# Patient Record
Sex: Male | Born: 1955 | Race: Black or African American | Hispanic: No | Marital: Single | Smoking: Former smoker
Health system: Southern US, Community
[De-identification: ages and names within clinical notes are randomized; demographics above are authoritative.]

## PROBLEM LIST (undated history)

## (undated) DIAGNOSIS — N189 Chronic kidney disease, unspecified: Secondary | ICD-10-CM

## (undated) DIAGNOSIS — I10 Essential (primary) hypertension: Secondary | ICD-10-CM

## (undated) DIAGNOSIS — I509 Heart failure, unspecified: Secondary | ICD-10-CM

## (undated) DIAGNOSIS — E119 Type 2 diabetes mellitus without complications: Secondary | ICD-10-CM

## (undated) DIAGNOSIS — I639 Cerebral infarction, unspecified: Secondary | ICD-10-CM

## (undated) HISTORY — PX: WISDOM TOOTH EXTRACTION: SHX21

---

## 2016-01-14 ENCOUNTER — Encounter (HOSPITAL_COMMUNITY): Payer: Self-pay

## 2016-01-14 ENCOUNTER — Inpatient Hospital Stay (HOSPITAL_COMMUNITY)
Admission: EM | Admit: 2016-01-14 | Discharge: 2016-01-17 | DRG: 065 | Disposition: A | Discharge status: Home Health | Payer: Self-pay | Attending: Internal Medicine | Admitting: Internal Medicine

## 2016-01-14 DIAGNOSIS — I160 Hypertensive urgency: Secondary | ICD-10-CM | POA: Diagnosis present

## 2016-01-14 DIAGNOSIS — E1165 Type 2 diabetes mellitus with hyperglycemia: Secondary | ICD-10-CM | POA: Diagnosis present

## 2016-01-14 DIAGNOSIS — I10 Essential (primary) hypertension: Secondary | ICD-10-CM | POA: Diagnosis present

## 2016-01-14 DIAGNOSIS — E1149 Type 2 diabetes mellitus with other diabetic neurological complication: Secondary | ICD-10-CM | POA: Diagnosis present

## 2016-01-14 DIAGNOSIS — R42 Dizziness and giddiness: Secondary | ICD-10-CM

## 2016-01-14 DIAGNOSIS — H5509 Other forms of nystagmus: Secondary | ICD-10-CM | POA: Diagnosis present

## 2016-01-14 DIAGNOSIS — G450 Vertebro-basilar artery syndrome: Secondary | ICD-10-CM

## 2016-01-14 DIAGNOSIS — E876 Hypokalemia: Secondary | ICD-10-CM | POA: Diagnosis present

## 2016-01-14 DIAGNOSIS — N179 Acute kidney failure, unspecified: Secondary | ICD-10-CM | POA: Diagnosis present

## 2016-01-14 DIAGNOSIS — F17210 Nicotine dependence, cigarettes, uncomplicated: Secondary | ICD-10-CM | POA: Diagnosis present

## 2016-01-14 DIAGNOSIS — I639 Cerebral infarction, unspecified: Principal | ICD-10-CM | POA: Diagnosis present

## 2016-01-14 HISTORY — DX: Type 2 diabetes mellitus without complications: E11.9

## 2016-01-14 HISTORY — DX: Essential (primary) hypertension: I10

## 2016-01-14 LAB — CBC
HEMATOCRIT: 40 % (ref 39.0–52.0)
Hemoglobin: 13.9 g/dL (ref 13.0–17.0)
MCH: 32.1 pg (ref 26.0–34.0)
MCHC: 34.8 g/dL (ref 30.0–36.0)
MCV: 92.4 fL (ref 78.0–100.0)
Platelets: 246 10*3/uL (ref 150–400)
RBC: 4.33 MIL/uL (ref 4.22–5.81)
RDW: 13.6 % (ref 11.5–15.5)
WBC: 13.1 10*3/uL — ABNORMAL HIGH (ref 4.0–10.5)

## 2016-01-14 LAB — CBG MONITORING, ED: Glucose-Capillary: 203 mg/dL — ABNORMAL HIGH (ref 65–99)

## 2016-01-14 MED ORDER — ONDANSETRON 4 MG PO TBDP: 4.0000 mg | ORAL_TABLET | Freq: Once | ORAL | Status: DC | PRN

## 2016-01-14 MED ORDER — ONDANSETRON 8 MG PO TBDP
8.0000 mg | ORAL_TABLET | Freq: Once | ORAL | Status: AC | PRN
  Administered 2016-01-14: 8 mg via ORAL

## 2016-01-14 MED ORDER — ONDANSETRON 8 MG PO TBDP
ORAL_TABLET | ORAL | Status: AC
  Filled 2016-01-14: qty 1

## 2016-01-14 NOTE — ED Triage Notes (Signed)
Nausea and vomiting today after eating [employer]. Emesis X3 today. Denies diarrhea

## 2016-01-14 NOTE — ED Notes (Signed)
Patient states he has high blood pressure, but does not take his medication for it.

## 2016-01-15 ENCOUNTER — Observation Stay (HOSPITAL_BASED_OUTPATIENT_CLINIC_OR_DEPARTMENT_OTHER): Payer: Self-pay

## 2016-01-15 ENCOUNTER — Inpatient Hospital Stay (HOSPITAL_COMMUNITY): Payer: Self-pay

## 2016-01-15 ENCOUNTER — Observation Stay (HOSPITAL_COMMUNITY): Payer: Self-pay

## 2016-01-15 DIAGNOSIS — I639 Cerebral infarction, unspecified: Secondary | ICD-10-CM | POA: Diagnosis present

## 2016-01-15 DIAGNOSIS — E876 Hypokalemia: Secondary | ICD-10-CM

## 2016-01-15 DIAGNOSIS — N179 Acute kidney failure, unspecified: Secondary | ICD-10-CM

## 2016-01-15 DIAGNOSIS — I160 Hypertensive urgency: Secondary | ICD-10-CM | POA: Diagnosis present

## 2016-01-15 DIAGNOSIS — I6350 Cerebral infarction due to unspecified occlusion or stenosis of unspecified cerebral artery: Secondary | ICD-10-CM

## 2016-01-15 DIAGNOSIS — E1149 Type 2 diabetes mellitus with other diabetic neurological complication: Secondary | ICD-10-CM

## 2016-01-15 LAB — URINALYSIS, ROUTINE W REFLEX MICROSCOPIC
BILIRUBIN URINE: NEGATIVE
Glucose, UA: 500 mg/dL — AB
Ketones, ur: 15 mg/dL — AB
LEUKOCYTES UA: NEGATIVE
NITRITE: NEGATIVE
PH: 7 (ref 5.0–8.0)
Protein, ur: 100 mg/dL — AB
SPECIFIC GRAVITY, URINE: 1.015 (ref 1.005–1.030)

## 2016-01-15 LAB — ECHOCARDIOGRAM COMPLETE
AVLVOTPG: 3 mmHg
CHL CUP STROKE VOLUME: 61 mL
EERAT: 15.21
EWDT: 176 ms
FS: 29 % (ref 28–44)
Height: 72 in
IVS/LV PW RATIO, ED: 0.8
LA vol index: 35.8 mL/m2
LA vol: 73.9 mL
LADIAMINDEX: 2.42 cm/m2
LASIZE: 50 mm
LAVOLA4C: 71.7 mL
LEFT ATRIUM END SYS DIAM: 50 mm
LV E/e' medial: 15.21
LV PW d: 16.9 mm — AB (ref 0.6–1.1)
LV SIMPSON'S DISK: 56
LV TDI E'MEDIAL: 7.72
LV dias vol index: 52 mL/m2
LV dias vol: 108 mL (ref 62–150)
LV e' LATERAL: 6.64 cm/s
LVEEAVG: 15.21
LVOT SV: 66 mL
LVOT VTI: 17.3 cm
LVOT area: 3.8 cm2
LVOT diameter: 22 mm
LVOT peak vel: 82 cm/s
LVSYSVOL: 48 mL (ref 21–61)
LVSYSVOLIN: 23 mL/m2
MV Dec: 176
MV pk A vel: 42.7 m/s
MVPG: 4 mmHg
MVPKEVEL: 101 m/s
RV LATERAL S' VELOCITY: 10.2 cm/s
RV TAPSE: 17.4 mm
TDI e' lateral: 6.64
Weight: 2934.76 oz

## 2016-01-15 LAB — COMPREHENSIVE METABOLIC PANEL
ALBUMIN: 4.2 g/dL (ref 3.5–5.0)
ALBUMIN: 3.9 g/dL (ref 3.5–5.0)
ALK PHOS: 67 U/L (ref 38–126)
ALK PHOS: 60 U/L (ref 38–126)
ALT: 28 U/L (ref 17–63)
ALT: 25 U/L (ref 17–63)
ANION GAP: 12 (ref 5–15)
ANION GAP: 12 (ref 5–15)
AST: 36 U/L (ref 15–41)
AST: 30 U/L (ref 15–41)
BUN: 21 mg/dL — ABNORMAL HIGH (ref 6–20)
BUN: 18 mg/dL (ref 6–20)
CALCIUM: 8.3 mg/dL — AB (ref 8.9–10.3)
CHLORIDE: 93 mmol/L — AB (ref 101–111)
CHLORIDE: 96 mmol/L — AB (ref 101–111)
CO2: 29 mmol/L (ref 22–32)
CO2: 28 mmol/L (ref 22–32)
CREATININE: 1.5 mg/dL — AB (ref 0.61–1.24)
Calcium: 8.8 mg/dL — ABNORMAL LOW (ref 8.9–10.3)
Creatinine, Ser: 1.3 mg/dL — ABNORMAL HIGH (ref 0.61–1.24)
GFR calc non Af Amer: 49 mL/min — ABNORMAL LOW (ref >60)
GFR calc non Af Amer: 58 mL/min — ABNORMAL LOW (ref >60)
GFR, EST AFRICAN AMERICAN: 56 mL/min — AB (ref >60)
GLUCOSE: 217 mg/dL — AB (ref 65–99)
Glucose, Bld: 201 mg/dL — ABNORMAL HIGH (ref 65–99)
POTASSIUM: 2.9 mmol/L — AB (ref 3.5–5.1)
Potassium: 3.1 mmol/L — ABNORMAL LOW (ref 3.5–5.1)
SODIUM: 134 mmol/L — AB (ref 135–145)
SODIUM: 136 mmol/L (ref 135–145)
Total Bilirubin: 1.1 mg/dL (ref 0.3–1.2)
Total Bilirubin: 1.5 mg/dL — ABNORMAL HIGH (ref 0.3–1.2)
Total Protein: 8.1 g/dL (ref 6.5–8.1)
Total Protein: 7.4 g/dL (ref 6.5–8.1)

## 2016-01-15 LAB — LIPID PANEL
CHOL/HDL RATIO: 3 ratio
Cholesterol: 147 mg/dL (ref 0–200)
HDL: 49 mg/dL (ref >40)
LDL CALC: 90 mg/dL (ref 0–99)
TRIGLYCERIDES: 41 mg/dL (ref <150)
VLDL: 8 mg/dL (ref 0–40)

## 2016-01-15 LAB — GLUCOSE, CAPILLARY
GLUCOSE-CAPILLARY: 186 mg/dL — AB (ref 65–99)
Glucose-Capillary: 167 mg/dL — ABNORMAL HIGH (ref 65–99)
Glucose-Capillary: 184 mg/dL — ABNORMAL HIGH (ref 65–99)

## 2016-01-15 LAB — MRSA PCR SCREENING: MRSA by PCR: NEGATIVE

## 2016-01-15 LAB — URINE MICROSCOPIC-ADD ON
SQUAMOUS EPITHELIAL / LPF: NONE SEEN
WBC UA: NONE SEEN WBC/hpf (ref 0–5)

## 2016-01-15 LAB — TROPONIN I: TROPONIN I: 0.03 ng/mL — AB (ref <0.03)

## 2016-01-15 LAB — ETHANOL: ALCOHOL ETHYL (B): 7 mg/dL — AB (ref <5)

## 2016-01-15 LAB — TSH: TSH: 0.978 u[IU]/mL (ref 0.350–4.500)

## 2016-01-15 LAB — LIPASE, BLOOD: Lipase: 32 U/L (ref 11–51)

## 2016-01-15 MED ORDER — LABETALOL HCL 5 MG/ML IV SOLN
2.0000 mg/min | INTRAVENOUS | Status: DC
  Administered 2016-01-15: 2 mg/min via INTRAVENOUS
  Administered 2016-01-15: 0.5 mg/min via INTRAVENOUS
  Administered 2016-01-15: 1 mg/min via INTRAVENOUS
  Administered 2016-01-16: 2 mg/min via INTRAVENOUS
  Filled 2016-01-15: qty 100

## 2016-01-15 MED ORDER — INSULIN ASPART 100 UNIT/ML ~~LOC~~ SOLN
0.0000 [IU] | Freq: Three times a day (TID) | SUBCUTANEOUS | Status: DC
  Administered 2016-01-15 (×2): 3 [IU] via SUBCUTANEOUS
  Administered 2016-01-16 (×2): 5 [IU] via SUBCUTANEOUS
  Administered 2016-01-16: 3 [IU] via SUBCUTANEOUS
  Administered 2016-01-17: 2 [IU] via SUBCUTANEOUS

## 2016-01-15 MED ORDER — LABETALOL HCL 5 MG/ML IV SOLN
INTRAVENOUS | Status: AC
  Filled 2016-01-15: qty 4

## 2016-01-15 MED ORDER — LABETALOL HCL 5 MG/ML IV SOLN
INTRAVENOUS | Status: AC
  Filled 2016-01-15: qty 20

## 2016-01-15 MED ORDER — ONDANSETRON HCL 4 MG/2ML IJ SOLN
4.0000 mg | Freq: Once | INTRAMUSCULAR | Status: AC
  Administered 2016-01-15: 4 mg via INTRAVENOUS
  Filled 2016-01-15: qty 2

## 2016-01-15 MED ORDER — POTASSIUM CHLORIDE 10 MEQ/100ML IV SOLN
10.0000 meq | INTRAVENOUS | Status: AC
Start: 2016-01-15 — End: 2016-01-15
  Administered 2016-01-15 (×4): 10 meq via INTRAVENOUS
  Filled 2016-01-15 (×4): qty 100

## 2016-01-15 MED ORDER — MECLIZINE HCL 12.5 MG PO TABS
25.0000 mg | ORAL_TABLET | Freq: Once | ORAL | Status: AC
  Administered 2016-01-15: 25 mg via ORAL
  Filled 2016-01-15: qty 2

## 2016-01-15 MED ORDER — POTASSIUM CHLORIDE CRYS ER 20 MEQ PO TBCR
40.0000 meq | EXTENDED_RELEASE_TABLET | Freq: Once | ORAL | Status: AC
  Administered 2016-01-15: 40 meq via ORAL
  Filled 2016-01-15: qty 2

## 2016-01-15 MED ORDER — METOPROLOL TARTRATE 25 MG PO TABS
25.0000 mg | ORAL_TABLET | Freq: Two times a day (BID) | ORAL | Status: DC
  Administered 2016-01-15 – 2016-01-17 (×5): 25 mg via ORAL
  Filled 2016-01-15 (×5): qty 1

## 2016-01-15 MED ORDER — ASPIRIN 325 MG PO TABS
325.0000 mg | ORAL_TABLET | Freq: Every day | ORAL | Status: DC
  Administered 2016-01-15 – 2016-01-17 (×3): 325 mg via ORAL
  Filled 2016-01-15 (×3): qty 1

## 2016-01-15 MED ORDER — HYDRALAZINE HCL 20 MG/ML IJ SOLN
20.0000 mg | Freq: Once | INTRAMUSCULAR | Status: DC
  Filled 2016-01-15: qty 1

## 2016-01-15 MED ORDER — INSULIN ASPART 100 UNIT/ML ~~LOC~~ SOLN
4.0000 [IU] | Freq: Three times a day (TID) | SUBCUTANEOUS | Status: DC
  Administered 2016-01-15 – 2016-01-17 (×6): 4 [IU] via SUBCUTANEOUS

## 2016-01-15 MED ORDER — OXYCODONE-ACETAMINOPHEN 5-325 MG PO TABS
1.0000 | ORAL_TABLET | ORAL | Status: DC | PRN
  Administered 2016-01-15: 1 via ORAL
  Filled 2016-01-15: qty 1

## 2016-01-15 MED ORDER — ENOXAPARIN SODIUM 40 MG/0.4ML ~~LOC~~ SOLN
40.0000 mg | SUBCUTANEOUS | Status: DC
  Administered 2016-01-15 – 2016-01-17 (×3): 40 mg via SUBCUTANEOUS
  Filled 2016-01-15 (×3): qty 0.4

## 2016-01-15 MED ORDER — STROKE: EARLY STAGES OF RECOVERY BOOK
Freq: Once | Status: DC
  Filled 2016-01-15: qty 1

## 2016-01-15 MED ORDER — INSULIN ASPART 100 UNIT/ML ~~LOC~~ SOLN: 0.0000 [IU] | Freq: Every day | SUBCUTANEOUS | Status: DC

## 2016-01-15 MED ORDER — HYDRALAZINE HCL 20 MG/ML IJ SOLN
10.0000 mg | Freq: Once | INTRAMUSCULAR | Status: AC
  Administered 2016-01-15: 10 mg via INTRAVENOUS
  Filled 2016-01-15: qty 1

## 2016-01-15 MED ORDER — SODIUM CHLORIDE 0.9 % IV SOLN
INTRAVENOUS | Status: DC
  Administered 2016-01-15: 10 mL via INTRAVENOUS

## 2016-01-15 MED ORDER — ONDANSETRON HCL 4 MG/2ML IJ SOLN: 4.0000 mg | Freq: Four times a day (QID) | INTRAMUSCULAR | Status: DC | PRN

## 2016-01-15 MED ORDER — ASPIRIN 300 MG RE SUPP: 300.0000 mg | Freq: Every day | RECTAL | Status: DC

## 2016-01-15 NOTE — Progress Notes (Signed)
Patient got up with PT this afternoon.  Did not tolerate well. Became dizzy and started to vomit.  Placed back on side of bed.  Rechecked BP with was elevated to 190's/120's.  Orthostatic BP negative.  Restarted labetalol drip at 1mg /min. Text paged Dr. Ardyth HarpsHernandez to inform of condition.  Patient is resting quietly at this time with no c/o of pain.  Neuro assessment done no signs of deficients noted.

## 2016-01-15 NOTE — Progress Notes (Signed)
Patient seen and examined, database reviewed. Patient admitted earlier today with dizziness. 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 Has been started on a labetalol drip, BP down to around 160/90. Will start PO metoprolol, wean labetalol drip. Stroke work up is in progress. Start SSI. Once off drip can transfer to floor.  Peggye PittEstela Hernandez, MD [facility] Pager: [phone]

## 2016-01-15 NOTE — Progress Notes (Signed)
Inpatient Diabetes Program Recommendations  AACE/ADA: New Consensus Statement on Inpatient Glycemic Control (2015)  Target Ranges:  Prepandial:   less than 140 mg/dL      Peak postprandial:   less than 180 mg/dL (1-2 hours)      Critically ill patients:  140 - 180 mg/dL   Results for Matthew Robles, Matthew Robles (MRN [id number]) as of 01/15/2016 08:14  Ref. Range 01/14/2016 23:00  Glucose-Capillary Latest Ref Range: 65 - 99 mg/dL 403203 (H)   Review of Glycemic Control  Diabetes history: DM2 Outpatient Diabetes medications: None noted on home medication list Current orders for Inpatient glycemic control: None  Inpatient Diabetes Program Recommendations: Correction (SSI): Please consider ordering CBGs with Novolog correction scale Q4H (if diet resumed change to ACHS). HgbA1C: A1C in process.  Thanks, Matthew PennerMarie Donjuan Robison, RN, MSN, CDE Diabetes Coordinator Inpatient Diabetes Program [phone] (Team Pager from 8am to 5pm) [phone] ([facility] office) [phone] [facility] office) [phone] [facility] office)

## 2016-01-15 NOTE — Consult Note (Signed)
Amberley A. Merlene Laughter, MD     [email or web address]          Matthew Robles is an 60 y.o. male.   ASSESSMENT/PLAN: 1. 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 The patient's symptoms seem somewhat worse than the MRI findings. This makes me wonder if he has vertebrobasilar insufficiency. The hypertension on presentation is also concerning for possible vertebrobasilar insufficiency.  2. Chronic ischemic changes on MRI although the findings may also suggest posterior reversible encephalopathy syndrome is particularly due to poorly controlled hypertension.      RECOMMENDATION: Brain MRA to assess the intracranial blood vessels. Continue with aspirin. His blood pressure is currently 160/90s. I think this is fine. I would not drop it much further in the next couple days. Additional labs.     The patient is 59 year old white male who presents with a two-day history of acute vertiginous symptoms. The patient reports that on yesterday he developed dizziness described as disequilibrium. This was followed several minutes later with nausea and vomiting. The patient denies headaches. He denies focal numbness or weakness. He denies dysarthria or dysphasia. The patient has a history of hypertension and diabetes. He tells me however that he has not taken medications in several years. He has been managing his diabetes by diet control. He has not taken any medications for blood pressure. The patient reports feeling somewhat better today. He does not report having chest pain or shortness of breath. The review systems is otherwise negative.    GENERAL: This is a pleasant man in no acute distress.  HEENT: Supple. Atraumatic normocephalic.   ABDOMEN: soft  EXTREMITIES: No edema   BACK: Normal.  SKIN: Normal by inspection.    MENTAL STATUS: He is sleeping but easily arousable. He is coherent and lucid. He is oriented 3 including month and the his age.  He follows commands well. There is no evidence of dysarthria or aphasia.  CRANIAL NERVES: Pupils are equal, round and reactive to light and accommodation; extra ocular movements are full, there is significant nystagmus makes torsional horizontal nystagmus in primary position and in all gazes of movements including vertical but most pronounced in the right; visual fields are full; upper and lower facial muscles are normal in strength and symmetric, there is no flattening of the nasolabial folds; tongue is midline; uvula is midline; shoulder elevation is normal.  MOTOR: Normal tone, bulk and strength; no pronator drift.  COORDINATION: Left finger to nose is normal, right finger to nose is normal, No rest tremor; no intention tremor; no postural tremor; no bradykinesia. Heel-to-shin is also normal.  REFLEXES: Deep tendon reflexes are symmetrical and normal. Babinski reflexes are flexor bilaterally.   SENSATION: Normal to light touch. There is no extinction to double simultaneous tactile or visual stimulation.   NIH stroke scale is 1.     The brain MRI is reviewed in person. There is a small infarct involving the inferior cerebellum on the right side. It is seen on 2 cuts on diffusion imaging. There are significant chronic ischemic white matter changes seen particular involving the periventricular regions. There is more seen along the posterior horn of the lateral ventricle suggestive of posterior reversible syndrome. No hemorrhages are seen. No chronic infarcts are seen.   Blood pressure (!) 170/100, pulse 82, temperature 98.1 F (36.7 C), temperature source Oral, resp. rate 14, height 6' (1.829 m), weight 183 lb 6.8 oz (83.2 kg), SpO2 100 %.  Past Medical History:  Diagnosis Date  . Diabetes  mellitus without complication ([place])   . Hypertension     History reviewed. No pertinent surgical history.  History reviewed. No pertinent family history.  Social History:  reports that he has been  smoking Cigarettes.  He has been smoking about 1.00 pack per day. He has never used smokeless tobacco. He reports that he does not drink alcohol. His drug history is not on file.  Allergies: No Known Allergies  Medications: Prior to Admission medications   Not on File    Scheduled Meds: .  stroke: mapping our early stages of recovery book   Does not apply Once  . aspirin  300 mg Rectal Daily   Or  . aspirin  325 mg Oral Daily  . enoxaparin (LOVENOX) injection  40 mg Subcutaneous Q24H  . insulin aspart  0-15 Units Subcutaneous TID WC  . insulin aspart  0-5 Units Subcutaneous QHS  . insulin aspart  4 Units Subcutaneous TID WC  . metoprolol tartrate  25 mg Oral BID   Continuous Infusions: . sodium chloride 10 mL (01/15/16 0559)  . labetalol (NORMODYNE) infusion 1 mg/min (01/15/16 1721)   PRN Meds:.ondansetron (ZOFRAN) IV     Results for orders placed or performed during the hospital encounter of 01/14/16 (from the past 48 hour(s))  CBG monitoring, ED     Status: Abnormal   Collection Time: 01/14/16 11:00 PM  Result Value Ref Range   Glucose-Capillary 203 (H) 65 - 99 mg/dL  Lipase, blood     Status: None   Collection Time: 01/14/16 11:20 PM  Result Value Ref Range   Lipase 32 11 - 51 U/L  Comprehensive metabolic panel     Status: Abnormal   Collection Time: 01/14/16 11:20 PM  Result Value Ref Range   Sodium 134 (L) 135 - 145 mmol/L   Potassium 2.9 (L) 3.5 - 5.1 mmol/L   Chloride 93 (L) 101 - 111 mmol/L   CO2 29 22 - 32 mmol/L   Glucose, Bld 201 (H) 65 - 99 mg/dL   BUN 21 (H) 6 - 20 mg/dL   Creatinine, Ser 1.50 (H) 0.61 - 1.24 mg/dL   Calcium 8.8 (L) 8.9 - 10.3 mg/dL   Total Protein 8.1 6.5 - 8.1 g/dL   Albumin 4.2 3.5 - 5.0 g/dL   AST 36 15 - 41 U/L   ALT 28 17 - 63 U/L   Alkaline Phosphatase 67 38 - 126 U/L   Total Bilirubin 1.1 0.3 - 1.2 mg/dL   GFR calc non Af Amer 49 (L) >60 mL/min   GFR calc Af Amer 56 (L) >60 mL/min    Comment: (NOTE) The eGFR has been  calculated using the CKD EPI equation. This calculation has not been validated in all clinical situations. eGFR's persistently <60 mL/min signify possible Chronic Kidney Disease.    Anion gap 12 5 - 15  CBC     Status: Abnormal   Collection Time: 01/14/16 11:20 PM  Result Value Ref Range   WBC 13.1 (H) 4.0 - 10.5 K/uL   RBC 4.33 4.22 - 5.81 MIL/uL   Hemoglobin 13.9 13.0 - 17.0 g/dL   HCT 40.0 39.0 - 52.0 %   MCV 92.4 78.0 - 100.0 fL   MCH 32.1 26.0 - 34.0 pg   MCHC 34.8 30.0 - 36.0 g/dL   RDW 13.6 11.5 - 15.5 %   Platelets 246 150 - 400 K/uL  Troponin I     Status: Abnormal   Collection Time: 01/14/16 11:20 PM  Result Value Ref Range   Troponin I 0.03 (HH) <0.03 ng/mL    Comment: CRITICAL RESULT CALLED TO, READ BACK BY AND VERIFIED WITH: Annett Fabian AT 0116 ON [id number] BY FORSYTH K   Ethanol     Status: Abnormal   Collection Time: 01/14/16 11:20 PM  Result Value Ref Range   Alcohol, Ethyl (B) 7 (H) <5 mg/dL    Comment:        LOWEST DETECTABLE LIMIT FOR SERUM ALCOHOL IS 5 mg/dL FOR MEDICAL PURPOSES ONLY   Urinalysis, Routine w reflex microscopic     Status: Abnormal   Collection Time: 01/14/16 11:35 PM  Result Value Ref Range   Color, Urine YELLOW YELLOW   APPearance CLEAR CLEAR   Specific Gravity, Urine 1.015 1.005 - 1.030   pH 7.0 5.0 - 8.0   Glucose, UA 500 (A) NEGATIVE mg/dL   Hgb urine dipstick SMALL (A) NEGATIVE   Bilirubin Urine NEGATIVE NEGATIVE   Ketones, ur 15 (A) NEGATIVE mg/dL   Protein, ur 100 (A) NEGATIVE mg/dL   Nitrite NEGATIVE NEGATIVE   Leukocytes, UA NEGATIVE NEGATIVE  Urine microscopic-add on     Status: Abnormal   Collection Time: 01/14/16 11:35 PM  Result Value Ref Range   Squamous Epithelial / LPF NONE SEEN NONE SEEN   WBC, UA NONE SEEN 0 - 5 WBC/hpf   RBC / HPF 0-5 0 - 5 RBC/hpf   Bacteria, UA RARE (A) NONE SEEN  MRSA PCR Screening     Status: None   Collection Time: 01/15/16  2:43 AM  Result Value Ref Range   MRSA by PCR NEGATIVE NEGATIVE      Comment:        The GeneXpert MRSA Assay (FDA approved for NASAL specimens only), is one component of a comprehensive MRSA colonization surveillance program. It is not intended to diagnose MRSA infection nor to guide or monitor treatment for MRSA infections.   Comprehensive metabolic panel     Status: Abnormal   Collection Time: 01/15/16  5:49 AM  Result Value Ref Range   Sodium 136 135 - 145 mmol/L   Potassium 3.1 (L) 3.5 - 5.1 mmol/L   Chloride 96 (L) 101 - 111 mmol/L   CO2 28 22 - 32 mmol/L   Glucose, Bld 217 (H) 65 - 99 mg/dL   BUN 18 6 - 20 mg/dL   Creatinine, Ser 1.30 (H) 0.61 - 1.24 mg/dL   Calcium 8.3 (L) 8.9 - 10.3 mg/dL   Total Protein 7.4 6.5 - 8.1 g/dL   Albumin 3.9 3.5 - 5.0 g/dL   AST 30 15 - 41 U/L   ALT 25 17 - 63 U/L   Alkaline Phosphatase 60 38 - 126 U/L   Total Bilirubin 1.5 (H) 0.3 - 1.2 mg/dL   GFR calc non Af Amer 58 (L) >60 mL/min   GFR calc Af Amer >60 >60 mL/min    Comment: (NOTE) The eGFR has been calculated using the CKD EPI equation. This calculation has not been validated in all clinical situations. eGFR's persistently <60 mL/min signify possible Chronic Kidney Disease.    Anion gap 12 5 - 15  Lipid panel     Status: None   Collection Time: 01/15/16  5:49 AM  Result Value Ref Range   Cholesterol 147 0 - 200 mg/dL   Triglycerides 41 <150 mg/dL   HDL 49 >40 mg/dL   Total CHOL/HDL Ratio 3.0 RATIO   VLDL 8 0 - 40 mg/dL   LDL  Cholesterol 90 0 - 99 mg/dL    Comment:        Total Cholesterol/HDL:CHD Risk Coronary Heart Disease Risk Table                     Men   Women  1/2 Average Risk   3.4   3.3  Average Risk       5.0   4.4  2 X Average Risk   9.6   7.1  3 X Average Risk  23.4   11.0        Use the calculated Patient Ratio above and the CHD Risk Table to determine the patient's CHD Risk.        ATP III CLASSIFICATION (LDL):  <100     mg/dL   Optimal  100-129  mg/dL   Near or Above                    Optimal  130-159  mg/dL    Borderline  160-189  mg/dL   High  >190     mg/dL   Very High   Glucose, capillary     Status: Abnormal   Collection Time: 01/15/16 11:39 AM  Result Value Ref Range   Glucose-Capillary 186 (H) 65 - 99 mg/dL  Glucose, capillary     Status: Abnormal   Collection Time: 01/15/16  4:42 PM  Result Value Ref Range   Glucose-Capillary 167 (H) 65 - 99 mg/dL   Comment 1 Notify RN     Studies/Results:   BRAIN MRI FINDINGS: Brain: There is a 3 mm focus of diffusion restriction within the right inferior cerebellar hemisphere consistent with acute infarction. No abnormal susceptibility hypointensity to indicate intracranial hemorrhage. There are numerous foci of T2 FLAIR hyperintense signal abnormality in subcortical and periventricular white matter probably representing chronic microvascular ischemic changes. Additionally there are confluent white matter T2 FLAIR hyperintense signal abnormalities within biparietal white matter an within the pons white matter.  Extra-axial space: No hydrocephalus. No midline shift. No effacement of basilar cisterns. No extra-axial collection is identified. Proximal intracranial flow voids are maintained. No abnormality of the cervical medullary junction.  Other: Extensive paranasal sinus mucosal thickening and patchy opacification. Bilateral maxillary sinus mucous retention cysts. No abnormal signal of the mastoid air cells. Orbits are unremarkable. Calvarium is unremarkable.  IMPRESSION: 1. Small acute infarct within the right inferior cerebellar hemisphere. 2. Patchy white matter T2 FLAIR signal abnormalities in the bilateral parietal lobes and in brainstem white matter may represent developing acute hypertensive encephalopathy/PRES. 3. Extensive paranasal sinus disease.     CAROTID DOPPLERS Color duplex indicates minimal heterogeneous and calcified plaque, with no hemodynamically significant stenosis by duplex criteria in the  extracranial cerebrovascular circulation.      Rael Yo A. Merlene Laughter, M.D.  Diplomate, Tax adviser of Psychiatry and Neurology ( Neurology). 01/15/2016, 5:24 PM

## 2016-01-15 NOTE — Evaluation (Signed)
Physical Therapy Evaluation Patient Details Name: Matthew Robles MRN: [id number] DOB: [birth year] Today's Date: 01/15/2016   History of Present Illness  60 y.o. male, with history of hypertension, not on medications, diabetes mellitus who came to the hospital after patient started feeling dizzy this afternoon. Patient says that he stopped at [facility] to have a snack but was not able to eat because of constant dizziness. He called his brother brought him to the ED. Patient has history of hypertension but does not take medications as he prefers to control it naturally. Patient had episode of nausea and vomiting the dizziness today. He denies any focal weakness or tingling of extremities. No slurred speech, no vision changes. No previous history of stroke or TIA.  In the ED patient was found to be in hypertensive urgency, started on labetalol drip.  MRI brain was obtained that showed small acute infarct within the right inferior cerebellar hemisphere.  Patchy white matter T2 FLAIR signal abnormalities in the bilateral parietal lobes and in brainstem white matter may represent developing acute hypertensive encephalopathy/PRES.  Clinical Impression  Pt received in bed, and was agreeable to PT evaluation.  Dtr arrived during evaluation.  Pt expressed that he is normally independent with all functional mobility, including gait, ADL's, IADL's, and driving.  He is self employed and works on decks.  He demonstrates full strength and AROM in all 4 extremities.  He c/o loosing his balance, nausea and unsteadiness as his symptoms.  His resting BP was 161/93, with HR of 79bpm.  He was able to ambulate 33ft with no DME today, however after 29ft he demonstrated major LOB, and required Max A from PT to prevent from falling.  After returning to the room his BP was significantly elevated at 183/126, HR: 87bpm.  Pt also had a large amount of emesis.  Discussed d/c disposition, and recommend OPPT vs HHPT pending on if pt would  have a ride to get to OPPT.  Also recommending 24/7 supervision/assistance.  Recommendations for DME still pending.  Will trial DME at next visit.      Follow Up Recommendations Outpatient PT;Home health PT;Supervision/Assistance - 24 hour    Equipment Recommendations   (TBD)    Recommendations for Other Services       Precautions / Restrictions Precautions Precautions: Fall Precaution Comments: dizzy and unsteady with new dx of CVA.  Restrictions Weight Bearing Restrictions: No      Mobility  Bed Mobility Overal bed mobility: Needs Assistance Bed Mobility: Supine to Sit;Sit to Supine     Supine to sit: Supervision;HOB elevated Sit to supine: Supervision      Transfers Overall transfer level: Needs assistance Equipment used: None Transfers: Sit to/from Stand Sit to Stand: Min guard            Ambulation/Gait Ambulation/Gait assistance: Max assist;Min guard Ambulation Distance (Feet): 10 Feet Assistive device: None Gait Pattern/deviations: Ataxic;Staggering right     General Gait Details: Pt demonstrated major LOB after 29ft of gait, which required Max A from PT to prevent pt from falling.  RN provided w/c follow back to the room, and pt sat down on the EOB.  Pt found to have extremely elevated BP 183/126, and vomiting.  RN present at the end.    Stairs            Wheelchair Mobility    Modified Rankin (Stroke Patients Only) Modified Rankin (Stroke Patients Only) Pre-Morbid Rankin Score: No symptoms Modified Rankin: Moderately severe disability  Balance Overall balance assessment: Needs assistance   Sitting balance-Leahy Scale: Normal       Standing balance-Leahy Scale: Poor                               Pertinent Vitals/Pain Pain Assessment: No/denies pain    Home Living   Living Arrangements: Parent (mother)   Type of Home: House Home Access: Stairs to enter Entrance Stairs-Rails: Right Entrance Stairs-Number of  Steps: 3 steps  Home Layout: One level Home Equipment: None      Prior Function Level of Independence: Independent         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      Hand Dominance   Dominant Hand: Right    Extremity/Trunk Assessment   Upper Extremity Assessment: Overall WFL for tasks assessed           Lower Extremity Assessment: Overall WFL for tasks assessed         Communication   Communication: No difficulties  Cognition Arousal/Alertness: Awake/alert Behavior During Therapy: WFL for tasks assessed/performed Overall Cognitive Status: Within Functional Limits for tasks assessed                      General Comments      Exercises        Assessment/Plan    PT Assessment Patient needs continued PT services  PT Diagnosis Difficulty walking;Abnormality of gait   PT Problem List Decreased activity tolerance;Decreased balance;Decreased mobility;Decreased coordination;Decreased safety awareness;Decreased knowledge of precautions  PT Treatment Interventions DME instruction;Gait training;Functional mobility training;Stair training;Therapeutic activities;Balance training;Therapeutic exercise;Patient/family education   PT Goals (Current goals can be found in the Care Plan section) Acute Rehab PT Goals Patient Stated Goal: Pt wants to feel better PT Goal Formulation: With patient/family Time For Goal Achievement: 01/22/16 Potential to Achieve Goals: Good    Frequency 7X/week   Barriers to discharge        Co-evaluation               End of Session Equipment Utilized During Treatment: Gait belt Activity Tolerance: Treatment limited secondary to medical complications (Comment) Patient left: in bed;with family/visitor present;with nursing/sitter in room;with call bell/phone within reach Nurse Communication: Mobility status         Time: 1252-1321 PT Time Calculation (min) (ACUTE ONLY): 29 min   Charges:   PT  Evaluation $PT Eval High Complexity: 1 Procedure PT Treatments $Gait Training: 8-22 mins   PT G Codes:        Rayshawn Visconti 01/15/2016, 2:03 PM

## 2016-01-15 NOTE — Progress Notes (Signed)
*  PRELIMINARY RESULTS* Echocardiogram 2D Echocardiogram has been performed.  Stacey DrainWhite, Gradie Ohm J 01/15/2016, 4:50 PM

## 2016-01-15 NOTE — H&P (Signed)
TRH H&P   Patient Demographics:    Matthew Robles, is a 60 y.o. male  MRN: [id number]  DOB - 04-19-1956  Admit Date - 01/14/2016  Outpatient Primary MD for the patient is No PCP Per Patient  Referring MD/NP/PA: Dr. Elesa Massed  Patient coming from: Home  Chief Complaint  Patient presents with  . Emesis      HPI:    Matthew Robles  is a 60 y.o. male, With history of hypertension, not on medications, diabetes mellitus who came to the hospital after patient started feeling dizzy this afternoon. Patient says that he stopped at [facility] to have a snack but was not able to eat because of constant dizziness. He called his brother brought him to the ED. Patient has history of hypertension but does not take medications as he prefers to control it naturally. Patient had episode of nausea and vomiting the dizziness today. He denies any focal weakness or tingling of extremities. No slurred speech, no vision changes. No previous history of stroke or TIA.  In the ED patient was found to be in hypertensive urgency, started on labetalol drip. MRI brain was obtained that showed small acute infarct within the right inferior cerebellar hemisphere  Patchy white matter T2 FLAIR signal abnormalities in the bilateral parietal lobes and in brainstem white matter may represent developing acute hypertensive encephalopathy/PRES.    Review of systems:    In addition to the HPI above,  No Fever-chills, No Headache, No changes with Vision or hearing, No problems swallowing food or Liquids, No Chest pain, Cough or Shortness of Breath, No Blood in stool or Urine, No dysuria, No new skin rashes or bruises, No new joints pains-aches,  No new weakness, tingling, numbness in any extremity, No recent weight gain or loss, No polyuria, polydypsia or polyphagia, No significant Mental Stressors.  A full 10 point Review of Systems was  done, except as stated above, all other Review of Systems were negative.   With Past History of the following :    Past Medical History:  Diagnosis Date  . Diabetes mellitus without complication (HCC)   . Hypertension       History reviewed. No pertinent surgical history.    Social History:     Social History  Substance Use Topics  . Smoking status: Current Every Day Smoker    Packs/day: 1.00    Types: Cigarettes  . Smokeless tobacco: Never Used  . Alcohol use No     Comment: a couple of times a week       Family History :      Home Medications:   Prior to Admission medications   Not on File     Allergies:    No Known Allergies   Physical Exam:   Vitals  Blood pressure (!) 219/119, pulse 85, temperature 97.9 F (36.6 C), temperature source Oral, resp. rate 15, height 6' (1.829 m), weight 88.5 kg (195 lb), SpO2 98 %.   1. General African-American male lying in bed  in NAD, cooperative with exam  2. Normal affect and insight, Awake Alert, Oriented X 3.  3. No F.N deficits, ALL C.Nerves Intact, Strength 5/5 all 4 extremities, Sensation intact all 4 extremities, Plantars down going.  4. Ears and Eyes appear Normal, Conjunctivae clear, PERRLA. Moist Oral Mucosa.  5. Supple Neck, No JVD, No cervical lymphadenopathy appriciated, No Carotid Bruits.  6. Symmetrical Chest wall movement, Good air movement bilaterally, CTAB.  7. RRR, No Gallops, Rubs or Murmurs, No Parasternal Heave.No Leg edema  8. Positive Bowel Sounds, Abdomen Soft, No tenderness, No organomegaly appriciated,No rebound -guarding or rigidity.  9.  No Cyanosis, Normal Skin Turgor, No Skin Rash or Bruise.  10. Good muscle tone,  joints appear normal , no effusions, Normal ROM.      Data Review:    CBC  Recent Labs Lab 01/14/16 2320  WBC 13.1*  HGB 13.9  HCT 40.0  PLT 246  MCV 92.4  MCH 32.1  MCHC 34.8  RDW 13.6    ------------------------------------------------------------------------------------------------------------------  Chemistries   Recent Labs Lab 01/14/16 2320  NA 134*  K 2.9*  CL 93*  CO2 29  GLUCOSE 201*  BUN 21*  CREATININE 1.50*  CALCIUM 8.8*  AST 36  ALT 28  ALKPHOS 67  BILITOT 1.1   ------------------------------------------------------------------------------------------------------------------  ------------------------------------------------------------------------------------------------------------------ GFR: Estimated Creatinine Clearance: 56.8 mL/min (by C-G formula based on SCr of 1.5 mg/dL). Liver Function Tests:  Recent Labs Lab 01/14/16 2320  AST 36  ALT 28  ALKPHOS 67  BILITOT 1.1  PROT 8.1  ALBUMIN 4.2    Recent Labs Lab 01/14/16 2320  LIPASE 32   No results for input(s): AMMONIA in the last 168 hours. Coagulation Profile: No results for input(s): INR, PROTIME in the last 168 hours. Cardiac Enzymes:  Recent Labs Lab 01/14/16 2320  TROPONINI 0.03*   BNP (last 3 results) No results for input(s): PROBNP in the last 8760 hours. HbA1C: No results for input(s): HGBA1C in the last 72 hours. CBG:  Recent Labs Lab 01/14/16 2300  GLUCAP 203*   Lipid Profile: No results for input(s): CHOL, HDL, LDLCALC, TRIG, CHOLHDL, LDLDIRECT in the last 72 hours. Thyroid Function Tests: No results for input(s): TSH, T4TOTAL, FREET4, T3FREE, THYROIDAB in the last 72 hours. Anemia Panel: No results for input(s): VITAMINB12, FOLATE, FERRITIN, TIBC, IRON, RETICCTPCT in the last 72 hours.  --------------------------------------------------------------------------------------------------------------- Urine analysis:    Component Value Date/Time   COLORURINE YELLOW 01/14/2016 2335   APPEARANCEUR CLEAR 01/14/2016 2335   LABSPEC 1.015 01/14/2016 2335   PHURINE 7.0 01/14/2016 2335   GLUCOSEU 500 (A) 01/14/2016 2335   HGBUR SMALL (A) 01/14/2016 2335    BILIRUBINUR NEGATIVE 01/14/2016 2335   KETONESUR 15 (A) 01/14/2016 2335   PROTEINUR 100 (A) 01/14/2016 2335   NITRITE NEGATIVE 01/14/2016 2335   LEUKOCYTESUR NEGATIVE 01/14/2016 2335      ----------------------------------------------------------------------------------------------------------------   Imaging Results:    [name] Contrast  Result Date: 01/15/2016 CLINICAL DATA:  60 y/o M; nausea and vomiting today with vertigo and history of hypertension. No known injury. EXAM: MRI HEAD WITHOUT CONTRAST TECHNIQUE: Multiplanar, multiecho pulse sequences of the brain and surrounding structures were obtained without intravenous contrast. COMPARISON:  None. FINDINGS: Brain: There is a 3 mm focus of diffusion restriction within the right inferior cerebellar hemisphere consistent with acute infarction. No abnormal susceptibility hypointensity to indicate intracranial hemorrhage. There are numerous foci of T2 FLAIR hyperintense signal abnormality in subcortical and periventricular white matter probably representing chronic microvascular ischemic changes.  Additionally there are confluent white matter T2 FLAIR hyperintense signal abnormalities within biparietal white matter an within the pons white matter. Extra-axial space: No hydrocephalus. No midline shift. No effacement of basilar cisterns. No extra-axial collection is identified. Proximal intracranial flow voids are maintained. No abnormality of the cervical medullary junction. Other: Extensive paranasal sinus mucosal thickening and patchy opacification. Bilateral maxillary sinus mucous retention cysts. No abnormal signal of the mastoid air cells. Orbits are unremarkable. Calvarium is unremarkable. IMPRESSION: 1. Small acute infarct within the right inferior cerebellar hemisphere. 2. Patchy white matter T2 FLAIR signal abnormalities in the bilateral parietal lobes and in brainstem white matter may represent developing acute hypertensive  encephalopathy/PRES. 3. Extensive paranasal sinus disease. These results were called by telephone at the time of interpretation on 01/15/2016 at 3:29 am to Dr. Rochele Raring , who verbally acknowledged these results. Electronically Signed   By: Mitzi Hansen M.D.   On: 01/15/2016 03:31    My personal review of EKG: Rhythm NSR   Assessment & Plan:    Active Problems:   Hypertensive urgency   Cerebellar stroke (HCC)   Hypokalemia   1. PRES- patient presented with hypertensive urgency and developing PRES as per MRI brain, neurology was consulted by the ED physician. Dr Roseanne Reno recommended blood pressure control. We'll consult neurology in a.m.  2.  CVA- MRI brain showed small acute infarct in the right inferior cerebellar hemisphere, patient out of TPA window and also has uncontrolled hypertension which is contraindication for TPA. We'll initiate stroke protocol, check carotid Doppler, CT head, MRA brain, carotid ultrasound, hemoglobin A1c, fasting lipid profile. Aspirin 300 mg daily  3.  Hypertensive urgency-we will continue labetalol drip 4. Hypokalemia- up his potassium went BMP in am   DVT Prophylaxis-   Lovenox   AM Labs Ordered, also please review Full Orders  Family Communication: No family at bedside   Code Status: Full code  Admission status: Observation    Time spent in minutes : 60 minutes   Nailyn Dearinger S M.D on 01/15/2016 at 4:08 AM  Between 7am to 7pm - Pager - [phone]. After 7pm go to [email or web address] - password [facility]  [facility] - Office  [phone]

## 2016-01-15 NOTE — Progress Notes (Signed)
OT Cancellation Note  Patient Details Name: Matthew BarrowSteve Robles MRN: [id number] DOB: 1956/04/22   Cancelled Treatment:     Reason evaluation not completed: Chart reviewed, spoke with nsg staff. Nsg concerned with pt still being on labetalol drip this am, requested OT hold until later today. Will check back at a later time.    Ezra SitesLeslie Troxler, OTR/L  [phone] 01/15/2016, 9:01 AM

## 2016-01-15 NOTE — ED Provider Notes (Signed)
[facility]-EMERGENCY DEPT Provider Note   CSN: [id number] Arrival date & time: 01/14/16  2232     History   Chief Complaint Chief Complaint  Patient presents with  . Emesis    HPI Matthew Robles is a 60 y.o. male with a past medical history of DM and htn but not treated, stating he has not seen a doctor in years presenting with dizziness which is described as unsteadiness and feeling of lightheadedness, not room spinning which started around 12 noon today and was followed by three separate of emesis.  The symptoms started shortly after eating a snack from [employer] which he stated tasted fine but does suspect possible food poisoning.  He denies fevers, abdominal pain or diarrhea however.  He also denies headache, vision changes, focal weakness and has had no chest pain, sob and denies peripheral edema.  He denies any recent illnesses. 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    The history is provided by the patient.    Past Medical History:  Diagnosis Date  . Diabetes mellitus without complication (HCC)   . Hypertension     Patient Active Problem List   Diagnosis Date Noted  . Hypertensive urgency 01/15/2016    History reviewed. No pertinent surgical history.     Home Medications    Prior to Admission medications   Not on File    Family History History reviewed. No pertinent family history.  Social History Social History  Substance Use Topics  . Smoking status: Current Every Day Smoker    Packs/day: 1.00    Types: Cigarettes  . Smokeless tobacco: Never Used  . Alcohol use No     Comment: a couple of times a week     Allergies   Review of patient's allergies indicates no known allergies.   Review of Systems Review of Systems  Constitutional: Negative for fever.  HENT: Negative for congestion and sore throat.   Eyes: Negative.   Respiratory: Negative for chest tightness and  shortness of breath.   Cardiovascular: Negative for chest pain.  Gastrointestinal: Positive for nausea and vomiting. Negative for abdominal pain.  Genitourinary: Negative.   Musculoskeletal: Negative for arthralgias, joint swelling and neck pain.  Skin: Negative.  Negative for rash and wound.  Neurological: Positive for dizziness and light-headedness. Negative for weakness, numbness and headaches.  Psychiatric/Behavioral: Negative.      Physical Exam Updated Vital Signs BP (!) 248/135   Pulse 85   Temp 97.9 F (36.6 C) (Oral)   Resp 16   Ht 6' (1.829 m)   Wt 88.5 kg   SpO2 97%   BMI 26.45 kg/m   Physical Exam  Constitutional: He appears well-developed and well-nourished.  HENT:  Head: Normocephalic and atraumatic.  Eyes: Conjunctivae are normal. Pupils are equal, round, and reactive to light. Right eye exhibits nystagmus. Left eye exhibits nystagmus.  Nystagmus present 2 beat most pronounced with leftward gaze.   Neck: Normal range of motion.  Cardiovascular: Normal rate, regular rhythm, normal heart sounds and intact distal pulses.   Pulmonary/Chest: Effort normal and breath sounds normal. No respiratory distress. He has no wheezes.  Abdominal: Soft. Bowel sounds are normal. He exhibits no mass. There is no tenderness. There is no guarding.  Musculoskeletal: Normal range of motion.  Neurological: He is alert. He has normal strength. He is not disoriented. No cranial nerve deficit or sensory deficit. Coordination normal.  Equal grip strength.  Negative pronator drift.  Skin: Skin is warm and dry.  Psychiatric: He has a normal mood and affect.  Nursing note and vitals reviewed.    ED Treatments / Results  Labs (all labs ordered are listed, but only abnormal results are displayed) Labs Reviewed  COMPREHENSIVE METABOLIC PANEL - Abnormal; Notable for the following:       Result Value   Sodium 134 (*)    Potassium 2.9 (*)    Chloride 93 (*)    Glucose, Bld 201 (*)     BUN 21 (*)    Creatinine, Ser 1.50 (*)    Calcium 8.8 (*)    GFR calc non Af Amer 49 (*)    GFR calc Af Amer 56 (*)    All other components within normal limits  CBC - Abnormal; Notable for the following:    WBC 13.1 (*)    All other components within normal limits  URINALYSIS, ROUTINE W REFLEX MICROSCOPIC (NOT AT [facility]) - Abnormal; Notable for the following:    Glucose, UA 500 (*)    Hgb urine dipstick SMALL (*)    Ketones, ur 15 (*)    Protein, ur 100 (*)    All other components within normal limits  URINE MICROSCOPIC-ADD ON - Abnormal; Notable for the following:    Bacteria, UA RARE (*)    All other components within normal limits  TROPONIN I - Abnormal; Notable for the following:    Troponin I 0.03 (*)    All other components within normal limits  ETHANOL - Abnormal; Notable for the following:    Alcohol, Ethyl (B) 7 (*)    All other components within normal limits  CBG MONITORING, ED - Abnormal; Notable for the following:    Glucose-Capillary 203 (*)    All other components within normal limits  LIPASE, BLOOD    EKG  EKG Interpretation  Date/Time:  Thursday January 15 2016 00:57:36 EDT Ventricular Rate:  80 PR Interval:    QRS Duration: 101 QT Interval:  416 QTC Calculation: 480 R Axis:   -22 Text Interpretation:  Sinus rhythm Left atrial enlargement Borderline left axis deviation Anteroseptal infarct, age indeterminate Lateral leads are also involved No old tracing to compare Confirmed by WARD,  DO, KRISTEN ([id number]) on 01/15/2016 1:04:55 AM       Radiology No results found.  Procedures Procedures (including critical care time)  Medications Ordered in ED Medications  potassium chloride 10 mEq in 100 mL IVPB (10 mEq Intravenous New Bag/Given 01/15/16 0140)  labetalol (NORMODYNE,TRANDATE) 500 mg in dextrose 5 % 125 mL (4 mg/mL) infusion (not administered)  ondansetron (ZOFRAN-ODT) disintegrating tablet 8 mg (8 mg Oral Given 01/14/16 2302)  ondansetron (ZOFRAN)  injection 4 mg (4 mg Intravenous Given 01/15/16 0059)  meclizine (ANTIVERT) tablet 25 mg (25 mg Oral Given 01/15/16 0104)     Initial Impression / Assessment and Plan / ED Course  I have reviewed the triage vital signs and the nursing notes.  Pertinent labs & imaging results that were available during my care of the patient were reviewed by me and considered in my medical decision making (see chart for details).  Clinical Course    Pt with hypertensive crisis with evidence of end organ damage with elevation in creatinine, ekg changes.  Pt will need admission for further tx of bp and will need mri in am to rule out intracranial process.  No headache, no need for emergent CT head at this time, sx began over  12 hours ago.  Pt was seen by Dr. Elesa Massed who arranged admission for pt.   Final Clinical Impressions(s) / ED Diagnoses   Final diagnoses:  Hypertensive urgency  Dizziness    New Prescriptions New Prescriptions   No medications on file     Burgess Amor, PA-C 01/15/16 0217    Kristen N Ward, DO 01/15/16 0403

## 2016-01-16 DIAGNOSIS — N179 Acute kidney failure, unspecified: Secondary | ICD-10-CM

## 2016-01-16 LAB — CBC
HCT: 35.6 % — ABNORMAL LOW (ref 39.0–52.0)
HEMOGLOBIN: 12.2 g/dL — AB (ref 13.0–17.0)
MCH: 32 pg (ref 26.0–34.0)
MCHC: 34.3 g/dL (ref 30.0–36.0)
MCV: 93.4 fL (ref 78.0–100.0)
PLATELETS: 210 10*3/uL (ref 150–400)
RBC: 3.81 MIL/uL — ABNORMAL LOW (ref 4.22–5.81)
RDW: 13.6 % (ref 11.5–15.5)
WBC: 10 10*3/uL (ref 4.0–10.5)

## 2016-01-16 LAB — BASIC METABOLIC PANEL
ANION GAP: 9 (ref 5–15)
BUN: 31 mg/dL — ABNORMAL HIGH (ref 6–20)
CALCIUM: 8.1 mg/dL — AB (ref 8.9–10.3)
CO2: 28 mmol/L (ref 22–32)
CREATININE: 2.71 mg/dL — AB (ref 0.61–1.24)
Chloride: 97 mmol/L — ABNORMAL LOW (ref 101–111)
GFR, EST AFRICAN AMERICAN: 28 mL/min — AB (ref >60)
GFR, EST NON AFRICAN AMERICAN: 24 mL/min — AB (ref >60)
GLUCOSE: 166 mg/dL — AB (ref 65–99)
Potassium: 3.1 mmol/L — ABNORMAL LOW (ref 3.5–5.1)
Sodium: 134 mmol/L — ABNORMAL LOW (ref 135–145)

## 2016-01-16 LAB — GLUCOSE, CAPILLARY
GLUCOSE-CAPILLARY: 233 mg/dL — AB (ref 65–99)
GLUCOSE-CAPILLARY: 88 mg/dL (ref 65–99)
Glucose-Capillary: 174 mg/dL — ABNORMAL HIGH (ref 65–99)
Glucose-Capillary: 202 mg/dL — ABNORMAL HIGH (ref 65–99)

## 2016-01-16 LAB — HEMOGLOBIN A1C
HEMOGLOBIN A1C: 7.8 % — AB (ref 4.8–5.6)
Mean Plasma Glucose: 177 mg/dL

## 2016-01-16 LAB — VITAMIN B12: VITAMIN B 12: 337 pg/mL (ref 180–914)

## 2016-01-16 LAB — MAGNESIUM: MAGNESIUM: 1.6 mg/dL — AB (ref 1.7–2.4)

## 2016-01-16 MED ORDER — INSULIN DETEMIR 100 UNIT/ML ~~LOC~~ SOLN
8.0000 [IU] | Freq: Every day | SUBCUTANEOUS | Status: DC
  Administered 2016-01-16: 8 [IU] via SUBCUTANEOUS
  Filled 2016-01-16 (×3): qty 0.08

## 2016-01-16 MED ORDER — PRAVASTATIN SODIUM 40 MG PO TABS
80.0000 mg | ORAL_TABLET | Freq: Every day | ORAL | Status: DC
  Administered 2016-01-16: 80 mg via ORAL
  Filled 2016-01-16: qty 2

## 2016-01-16 MED ORDER — POTASSIUM CHLORIDE CRYS ER 20 MEQ PO TBCR
40.0000 meq | EXTENDED_RELEASE_TABLET | Freq: Once | ORAL | Status: AC
  Administered 2016-01-16: 40 meq via ORAL
  Filled 2016-01-16: qty 2

## 2016-01-16 NOTE — Progress Notes (Signed)
Pt a/o.vss. IV patent. Up sitting in the recliner. Report called to L.Basilia Jumboovington, Charity fundraiserN. Pt to be transferred to room 319 via wheelchair with nursing staff.

## 2016-01-16 NOTE — Care Management Note (Signed)
Case Management Note  Patient Details  Name: Matthew Robles MRN: [id number] Date of Birth: July 05, 1955  Subjective/Objective: Patient is from home, ind with ADL's. His mom lives with him. Adm with hypertensive urgency and CVA. PT has recommended RW and HHPT. Patient agreeable and offered choice. Patient does not have PCP or insurance.               Action/Plan: Alroy BailiffLinda [facility] notified and will obtain orders from chart. RW will be delivered to room prior to discharge. [facility] at  2 pm for creatinine check.    Expected Discharge Date:                  Expected Discharge Plan:  Home/Self Care  In-House Referral:  NA  Discharge planning Services  CM Consult  Post Acute Care Choice:  Home Health, Durable Medical Equipment Choice offered to:  Patient  DME Arranged:  Walker rolling DME Agency:  [facility]  [facility]  [facility]     Status of Service:  In process, will continue to follow  If discussed at Long Length of Stay Meetings, dates discussed:    Additional Comments:  Kazim Corrales, Chrystine OilerSharley Diane, RN 01/16/2016, 2:37 PM

## 2016-01-16 NOTE — Progress Notes (Signed)
Physical Therapy Treatment Patient Details Name: Matthew Robles MRN: [id number] DOB: Oct 17, 1955 Today's Date: 01/16/2016    History of Present Illness 60 y.o. male, with history of hypertension, not on medications, diabetes mellitus who came to the hospital after patient started feeling dizzy this afternoon. Patient says that he stopped at [facility] to have a snack but was not able to eat because of constant dizziness. He called his brother brought him to the ED. Patient has history of hypertension but does not take medications as he prefers to control it naturally. Patient had episode of nausea and vomiting the dizziness today. He denies any focal weakness or tingling of extremities. No slurred speech, no vision changes. No previous history of stroke or TIA.  In the ED patient was found to be in hypertensive urgency, started on labetalol drip.  MRI brain was obtained that showed small acute infarct within the right inferior cerebellar hemisphere.  Patchy white matter T2 FLAIR signal abnormalities in the bilateral parietal lobes and in brainstem white matter may represent developing acute hypertensive encephalopathy/PRES.    PT Comments    Pt received sitting up in the chair this AM, and was agreeable to PT tx.  Pt clarified, that although he lives with his mom, she is very independent and still driving.  He does not physically assist her with anything at baseline, he just does stuff around the house.  Pt expressed that he still was having some dizziness and nausea.  Pt was able to improve gait distance to 32ft with RW, however, he required 1 standing rest break due to nausea.  Pt continues to demonstrate elevation of BP with mobility:  Vitals at rest: BP: 158/94, HR: 74bpm Vitals after ambulation: BP: 161/108, HR: 76bpm  Recommend that pt d/c home with HHPT with 24/7 supervision/assistance, and RW.    Follow Up Recommendations  Home health PT;Supervision/Assistance - 24 hour     Equipment  Recommendations  Rolling walker with 5" wheels    Recommendations for Other Services       Precautions / Restrictions Precautions Precautions: Fall Precaution Comments: dizzy and unsteady with new dx of CVA.  Restrictions Weight Bearing Restrictions: No    Mobility  Bed Mobility                  Transfers Overall transfer level: Needs assistance Equipment used: Rolling walker (2 wheeled) Transfers: Sit to/from [employer] Transfers Sit to Stand: Min guard Stand pivot transfers: Min guard          Ambulation/Gait Ambulation/Gait assistance: Min guard Ambulation Distance (Feet): 50 Feet Assistive device: Rolling walker (2 wheeled) Gait Pattern/deviations: Step-through pattern     General Gait Details: Pt with decreased gait speed, and guarded with minimal head movement.  Pt encouraged to try to look fowward.  After 58ft pt requires short standing rest break due to wave of nausea, but no emesis.  Pt returned to the room in the chair, and BP was 161/108 with HR: 76bpm  RN notified.    Stairs            Wheelchair Mobility    Modified Rankin (Stroke Patients Only)       Balance Overall balance assessment: Needs assistance         Standing balance support: Bilateral upper extremity supported Standing balance-Leahy Scale: Fair                      Cognition Arousal/Alertness: Awake/alert Behavior During Therapy: [facility]  for tasks assessed/performed Overall Cognitive Status: Within Functional Limits for tasks assessed                      Exercises      General Comments        Pertinent Vitals/Pain Pain Assessment: No/denies pain    Home Living                      Prior Function            PT Goals (current goals can now be found in the care plan section) Acute Rehab PT Goals Patient Stated Goal: Pt wants to feel better PT Goal Formulation: With patient/family Time For Goal Achievement:  01/22/16 Potential to Achieve Goals: Good Progress towards PT goals: Progressing toward goals    Frequency  7X/week    PT Plan Current plan remains appropriate    Co-evaluation             End of Session Equipment Utilized During Treatment: Gait belt Activity Tolerance: Patient tolerated treatment well Patient left: in chair;with call bell/phone within reach     Time: 1001-1019 PT Time Calculation (min) (ACUTE ONLY): 18 min  Charges:  $Gait Training: 8-22 mins                    G Codes:      Beth Anabel Lykins, PT, DPT X: [phone]

## 2016-01-16 NOTE — Progress Notes (Signed)
[place] A. Merlene Laughter, MD     [email or web address]          Matthew Robles is an 60 y.o. male.   Assessment/Plan: 1. 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 Small vessel and hypertensive urgency. MRA rule out large vessel  2. Chronic ischemic changes on MRI although the findings may also suggest posterior reversible encephalopathy syndrome is particularly due to poorly controlled hypertension.  3. 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 She just repeating MRA near future probably about 6 months from now. It will be good to do a CTA but the patient's creatinine as high.    RECOMMENDATION:  Continue with aspirin. Blood pressure control and her blood sugar control long-term.     Doing about the same.   GENERAL: This is a pleasant man in no acute distress.  HEENT: Supple. Atraumatic normocephalic.   ABDOMEN: soft  EXTREMITIES: No edema   BACK: Normal.  SKIN: Normal by inspection.    MENTAL STATUS: He is sleeping but easily arousable. He is coherent and lucid. He is oriented 3 including month and the his age. He follows commands well. There is no evidence of dysarthria or aphasia.  CRANIAL NERVES: Pupils are equal, round and reactive to light and accommodation; extra ocular movements are full, there is significant nystagmus makes torsional horizontal nystagmus in primary position and in all gazes of movements including vertical but most pronounced in the right; visual fields are full; upper and lower facial muscles are normal in strength and symmetric, there is no flattening of the nasolabial folds; tongue is midline; uvula is midline; shoulder elevation is normal.  MOTOR: Normal tone, bulk and strength; no pronator drift.  COORDINATION: Left finger to nose is normal, right finger to nose is normal, No rest tremor; no intention tremor; no postural tremor; no bradykinesia.   SENSATION: Normal to light  touch. There is no extinction to double simultaneous tactile or visual stimulation.           Objective: Vital signs in last 24 hours: Temp:  [97.7 F (36.5 C)-99.3 F (37.4 C)] 98.2 F (36.8 C) (08/25 1600) Pulse Rate:  [71-80] 71 (08/25 1600) Resp:  [13-18] 18 (08/25 1600) BP: (137-167)/(83-112) 164/103 (08/25 1600) SpO2:  [96 %-100 %] 100 % (08/25 1600) Weight:  [186 lb 11.7 oz (84.7 kg)-189 lb 2.5 oz (85.8 kg)] 189 lb 2.5 oz (85.8 kg) (08/25 1453)  Intake/Output from previous day: 08/24 0701 - 08/25 0700 In: 361.9 [P.O.:240; I.V.:121.9] Out: -  Intake/Output this shift: No intake/output data recorded. Nutritional status: Diet heart healthy/carb modified Room service appropriate? Yes; Fluid consistency: Thin   Lab Results: Results for orders placed or performed during the hospital encounter of 01/14/16 (from the past 48 hour(s))  CBG monitoring, ED     Status: Abnormal   Collection Time: 01/14/16 11:00 PM  Result Value Ref Range   Glucose-Capillary 203 (H) 65 - 99 mg/dL  Lipase, blood     Status: None   Collection Time: 01/14/16 11:20 PM  Result Value Ref Range   Lipase 32 11 - 51 U/L  Comprehensive metabolic panel     Status: Abnormal   Collection Time: 01/14/16 11:20 PM  Result Value Ref Range   Sodium 134 (L) 135 - 145 mmol/L   Potassium 2.9 (L) 3.5 - 5.1 mmol/L   Chloride 93 (L) 101 - 111 mmol/L   CO2 29 22 - 32 mmol/L   Glucose, Bld 201 (H) 65 - 99 mg/dL   BUN  21 (H) 6 - 20 mg/dL   Creatinine, Ser 1.50 (H) 0.61 - 1.24 mg/dL   Calcium 8.8 (L) 8.9 - 10.3 mg/dL   Total Protein 8.1 6.5 - 8.1 g/dL   Albumin 4.2 3.5 - 5.0 g/dL   AST 36 15 - 41 U/L   ALT 28 17 - 63 U/L   Alkaline Phosphatase 67 38 - 126 U/L   Total Bilirubin 1.1 0.3 - 1.2 mg/dL   GFR calc non Af Amer 49 (L) >60 mL/min   GFR calc Af Amer 56 (L) >60 mL/min    Comment: (NOTE) The eGFR has been calculated using the CKD EPI equation. This calculation has not been validated in all  clinical situations. eGFR's persistently <60 mL/min signify possible Chronic Kidney Disease.    Anion gap 12 5 - 15  CBC     Status: Abnormal   Collection Time: 01/14/16 11:20 PM  Result Value Ref Range   WBC 13.1 (H) 4.0 - 10.5 K/uL   RBC 4.33 4.22 - 5.81 MIL/uL   Hemoglobin 13.9 13.0 - 17.0 g/dL   HCT 40.0 39.0 - 52.0 %   MCV 92.4 78.0 - 100.0 fL   MCH 32.1 26.0 - 34.0 pg   MCHC 34.8 30.0 - 36.0 g/dL   RDW 13.6 11.5 - 15.5 %   Platelets 246 150 - 400 K/uL  Troponin I     Status: Abnormal   Collection Time: 01/14/16 11:20 PM  Result Value Ref Range   Troponin I 0.03 (HH) <0.03 ng/mL    Comment: CRITICAL RESULT CALLED TO, READ BACK BY AND VERIFIED WITH: NORMAN B AT 0116 ON [id number] BY FORSYTH K   Ethanol     Status: Abnormal   Collection Time: 01/14/16 11:20 PM  Result Value Ref Range   Alcohol, Ethyl (B) 7 (H) <5 mg/dL    Comment:        LOWEST DETECTABLE LIMIT FOR SERUM ALCOHOL IS 5 mg/dL FOR MEDICAL PURPOSES ONLY   Vitamin B12     Status: None   Collection Time: 01/14/16 11:20 PM  Result Value Ref Range   Vitamin B-12 337 180 - 914 pg/mL    Comment: (NOTE) This assay is not validated for testing neonatal or myeloproliferative syndrome specimens for Vitamin B12 levels. Performed at [facility]   TSH     Status: None   Collection Time: 01/14/16 11:20 PM  Result Value Ref Range   TSH 0.978 0.350 - 4.500 uIU/mL  Urinalysis, Routine w reflex microscopic     Status: Abnormal   Collection Time: 01/14/16 11:35 PM  Result Value Ref Range   Color, Urine YELLOW YELLOW   APPearance CLEAR CLEAR   Specific Gravity, Urine 1.015 1.005 - 1.030   pH 7.0 5.0 - 8.0   Glucose, UA 500 (A) NEGATIVE mg/dL   Hgb urine dipstick SMALL (A) NEGATIVE   Bilirubin Urine NEGATIVE NEGATIVE   Ketones, ur 15 (A) NEGATIVE mg/dL   Protein, ur 100 (A) NEGATIVE mg/dL   Nitrite NEGATIVE NEGATIVE   Leukocytes, UA NEGATIVE NEGATIVE  Urine microscopic-add on     Status: Abnormal    Collection Time: 01/14/16 11:35 PM  Result Value Ref Range   Squamous Epithelial / LPF NONE SEEN NONE SEEN   WBC, UA NONE SEEN 0 - 5 WBC/hpf   RBC / HPF 0-5 0 - 5 RBC/hpf   Bacteria, UA RARE (A) NONE SEEN  MRSA PCR Screening     Status: None   Collection  Time: 01/15/16  2:43 AM  Result Value Ref Range   MRSA by PCR NEGATIVE NEGATIVE    Comment:        The GeneXpert MRSA Assay (FDA approved for NASAL specimens only), is one component of a comprehensive MRSA colonization surveillance program. It is not intended to diagnose MRSA infection nor to guide or monitor treatment for MRSA infections.   Hemoglobin A1c     Status: Abnormal   Collection Time: 01/15/16  5:49 AM  Result Value Ref Range   Hgb A1c MFr Bld 7.8 (H) 4.8 - 5.6 %    Comment: (NOTE)         Pre-diabetes: 5.7 - 6.4         Diabetes: >6.4         Glycemic control for adults with diabetes: <7.0    Mean Plasma Glucose 177 mg/dL    Comment: (NOTE) Performed At: [facility] [place], [place] [id number] Lindon Romp MD [id number]   Comprehensive metabolic panel     Status: Abnormal   Collection Time: 01/15/16  5:49 AM  Result Value Ref Range   Sodium 136 135 - 145 mmol/L   Potassium 3.1 (L) 3.5 - 5.1 mmol/L   Chloride 96 (L) 101 - 111 mmol/L   CO2 28 22 - 32 mmol/L   Glucose, Bld 217 (H) 65 - 99 mg/dL   BUN 18 6 - 20 mg/dL   Creatinine, Ser 1.30 (H) 0.61 - 1.24 mg/dL   Calcium 8.3 (L) 8.9 - 10.3 mg/dL   Total Protein 7.4 6.5 - 8.1 g/dL   Albumin 3.9 3.5 - 5.0 g/dL   AST 30 15 - 41 U/L   ALT 25 17 - 63 U/L   Alkaline Phosphatase 60 38 - 126 U/L   Total Bilirubin 1.5 (H) 0.3 - 1.2 mg/dL   GFR calc non Af Amer 58 (L) >60 mL/min   GFR calc Af Amer >60 >60 mL/min    Comment: (NOTE) The eGFR has been calculated using the CKD EPI equation. This calculation has not been validated in all clinical situations. eGFR's persistently <60 mL/min signify possible Chronic Kidney Disease.     Anion gap 12 5 - 15  Lipid panel     Status: None   Collection Time: 01/15/16  5:49 AM  Result Value Ref Range   Cholesterol 147 0 - 200 mg/dL   Triglycerides 41 <150 mg/dL   HDL 49 >40 mg/dL   Total CHOL/HDL Ratio 3.0 RATIO   VLDL 8 0 - 40 mg/dL   LDL Cholesterol 90 0 - 99 mg/dL    Comment:        Total Cholesterol/HDL:CHD Risk Coronary Heart Disease Risk Table                     Men   Women  1/2 Average Risk   3.4   3.3  Average Risk       5.0   4.4  2 X Average Risk   9.6   7.1  3 X Average Risk  23.4   11.0        Use the calculated Patient Ratio above and the CHD Risk Table to determine the patient's CHD Risk.        ATP III CLASSIFICATION (LDL):  <100     mg/dL   Optimal  100-129  mg/dL   Near or Above  Optimal  130-159  mg/dL   Borderline  160-189  mg/dL   High  >190     mg/dL   Very High   Glucose, capillary     Status: Abnormal   Collection Time: 01/15/16 11:39 AM  Result Value Ref Range   Glucose-Capillary 186 (H) 65 - 99 mg/dL  Glucose, capillary     Status: Abnormal   Collection Time: 01/15/16  4:42 PM  Result Value Ref Range   Glucose-Capillary 167 (H) 65 - 99 mg/dL   Comment 1 Notify RN   Glucose, capillary     Status: Abnormal   Collection Time: 01/15/16  9:40 PM  Result Value Ref Range   Glucose-Capillary 184 (H) 65 - 99 mg/dL   Comment 1 Notify RN   Basic metabolic panel     Status: Abnormal   Collection Time: 01/16/16  5:26 AM  Result Value Ref Range   Sodium 134 (L) 135 - 145 mmol/L   Potassium 3.1 (L) 3.5 - 5.1 mmol/L   Chloride 97 (L) 101 - 111 mmol/L   CO2 28 22 - 32 mmol/L   Glucose, Bld 166 (H) 65 - 99 mg/dL   BUN 31 (H) 6 - 20 mg/dL   Creatinine, Ser 2.71 (H) 0.61 - 1.24 mg/dL    Comment: DELTA CHECK NOTED   Calcium 8.1 (L) 8.9 - 10.3 mg/dL   GFR calc non Af Amer 24 (L) >60 mL/min   GFR calc Af Amer 28 (L) >60 mL/min    Comment: (NOTE) The eGFR has been calculated using the CKD EPI equation. This calculation has  not been validated in all clinical situations. eGFR's persistently <60 mL/min signify possible Chronic Kidney Disease.    Anion gap 9 5 - 15  CBC     Status: Abnormal   Collection Time: 01/16/16  5:26 AM  Result Value Ref Range   WBC 10.0 4.0 - 10.5 K/uL   RBC 3.81 (L) 4.22 - 5.81 MIL/uL   Hemoglobin 12.2 (L) 13.0 - 17.0 g/dL   HCT 35.6 (L) 39.0 - 52.0 %   MCV 93.4 78.0 - 100.0 fL   MCH 32.0 26.0 - 34.0 pg   MCHC 34.3 30.0 - 36.0 g/dL   RDW 13.6 11.5 - 15.5 %   Platelets 210 150 - 400 K/uL  Magnesium     Status: Abnormal   Collection Time: 01/16/16  5:26 AM  Result Value Ref Range   Magnesium 1.6 (L) 1.7 - 2.4 mg/dL  Glucose, capillary     Status: Abnormal   Collection Time: 01/16/16  7:34 AM  Result Value Ref Range   Glucose-Capillary 174 (H) 65 - 99 mg/dL  Glucose, capillary     Status: Abnormal   Collection Time: 01/16/16 11:28 AM  Result Value Ref Range   Glucose-Capillary 202 (H) 65 - 99 mg/dL  Glucose, capillary     Status: Abnormal   Collection Time: 01/16/16  4:47 PM  Result Value Ref Range   Glucose-Capillary 233 (H) 65 - 99 mg/dL   Comment 1 Notify RN    Comment 2 Document in Chart     Lipid Panel  Recent Labs  01/15/16 0549  CHOL 147  TRIG 41  HDL 49  CHOLHDL 3.0  VLDL 8  LDLCALC 90    Studies/Results:   BRAIN MRA 1. No evidence of high-grade stenosis or large vessel occlusion of the circle of Willis. 2. 2 mm hyperintense focus anterior and lateral to the ophthalmic segment of right  ICA possibly tortuosity ophthalmic artery origin versus tiny aneurysm.   Medications:  Scheduled Meds: .  stroke: mapping our early stages of recovery book   Does not apply Once  . aspirin  300 mg Rectal Daily   Or  . aspirin  325 mg Oral Daily  . enoxaparin (LOVENOX) injection  40 mg Subcutaneous Q24H  . insulin aspart  0-15 Units Subcutaneous TID WC  . insulin aspart  0-5 Units Subcutaneous QHS  . insulin aspart  4 Units Subcutaneous TID WC  . insulin  detemir  8 Units Subcutaneous QHS  . metoprolol tartrate  25 mg Oral BID  . pravastatin  80 mg Oral q1800   Continuous Infusions: . sodium chloride 100 mL/hr at 01/16/16 1310   PRN Meds:.ondansetron (ZOFRAN) IV, oxyCODONE-acetaminophen     LOS: 1 day   Kofi A. Merlene Laughter, M.D.  Diplomate, Tax adviser of Psychiatry and Neurology ( Neurology).

## 2016-01-16 NOTE — Progress Notes (Signed)
OT Cancellation Note  Patient Details Name: Matthew Robles MRN: [id number] DOB: 1955/11/23   Cancelled Treatment:     Reason evaluation not completed: Pt finishing with PT on OT arrival into room. PT reports pt BP increased to 161/108 with transfer task, pt reports nausea with any head movement. Pt unable to participate in OT evaluation due to inability to attempt/complete ADLs secondary to severe nausea and BP increases. BUE strength and coordination WNL. Will continue to hold OT evaluation until pt BP is stable and pt is able to complete ADL tasks.    Ezra SitesLeslie Paislie Tessler, OTR/L  [phone] 01/16/2016, 10:24 AM

## 2016-01-16 NOTE — Progress Notes (Signed)
PROGRESS NOTE    Matthew Robles  ZOX:[id number] DOB: 06/13/1955 DOA: 01/14/2016 PCP: No PCP Per Patient     Brief Narrative:  60 y/o man admitted from home on 8/24 with dizziness; found to have a hypertensive urgency and an acute cerebellar CVA.   Assessment & Plan:   Active Problems:   Hypertensive urgency   Cerebellar stroke (HCC)   DM (diabetes mellitus), type 2 with neurological complications (HCC)   Hypokalemia   ARF (acute renal failure) (HCC)   Hypertensive Emergency -Required labetalol drip initially. -BP now around 160/90. Will not lower any more given acute CVA and risk of hypoperfusion. -Has been started on metoprolol. -Will not add any further agents for now, but will need close follow up with PCP upon DC.  Acute Cerebellar CVA -MRA negative. -ECHO: Study Conclusions  Left ventricle: The cavity size was mildly dilated. There was   moderate concentric hypertrophy. Systolic function was normal.   The estimated ejection fraction was in the range of 50% to 55%.   Wall motion was normal; there were no regional wall motion   abnormalities. Left ventricular diastolic function parameters   were normal. -Carotid dopplers: IMPRESSION: Color duplex indicates minimal heterogeneous and calcified plaque, with no hemodynamically significant stenosis by duplex criteria in the extracranial cerebrovascular circulation. -Started on ASA for secondary stroke prevention. -Statin for LDL of 90. -Needs good risk factor modification. -PT recs HHPT and 24 hour supervision.  DM II -Uncontrolled. -A1C of 7.8. -Cannot start metformin given ARF and risk for lactic acidosis. -Will start levemir low dose. -Will need insulin instruction.  Hypokalemia -Continue to replace orally. -Check Mg level.  ARF -Cr with increase overnight from 1.3 to 2.7. -Increase IVF, recheck renal function in am. -Would not be surprised if he has some degree of CKD given his uncontrolled HTN and  DM.   DVT prophylaxis: Lovenox Code Status: Full Code Family Communication: Patient only Disposition Plan: Transfer to floor. Hope for DC in 24-48 hours with improvement in renal function.  Consultants:   Neurology, Dr. Gerilyn Pilgrim  Procedures:   As above  Antimicrobials:   None    Subjective: Feels improved, altho still dizzy with movement.  Objective: Vitals:   01/16/16 1129 01/16/16 1200 01/16/16 1300 01/16/16 1400  BP:  137/88 (!) 154/83 (!) 150/98  Pulse:      Resp:  15 13 16   Temp: 97.7 F (36.5 C)     TempSrc: Oral     SpO2:      Weight:      Height:        Intake/Output Summary (Last 24 hours) at 01/16/16 1428 Last data filed at 01/16/16 0912  Gross per 24 hour  Intake              240 ml  Output                0 ml  Net              240 ml   Filed Weights   01/14/16 2253 01/15/16 0456 01/16/16 0500  Weight: 88.5 kg (195 lb) 83.2 kg (183 lb 6.8 oz) 84.7 kg (186 lb 11.7 oz)    Examination:  General exam: Alert, awake, oriented x 3 Respiratory system: Clear to auscultation. Respiratory effort normal. Cardiovascular system:RRR. No murmurs, rubs, gallops. Gastrointestinal system: Abdomen is nondistended, soft and nontender. No organomegaly or masses felt. Normal bowel sounds heard. Central nervous system: Alert and oriented. No focal neurological deficits. Have  not ambulated Extremities: No C/C/E, +pedal pulses Skin: No rashes, lesions or ulcers Psychiatry: Judgement and insight appear normal. Mood & affect appropriate.     Data Reviewed: I have personally reviewed following labs and imaging studies  CBC:  Recent Labs Lab 01/14/16 2320 01/16/16 0526  WBC 13.1* 10.0  HGB 13.9 12.2*  HCT 40.0 35.6*  MCV 92.4 93.4  PLT 246 210   Basic Metabolic Panel:  Recent Labs Lab 01/14/16 2320 01/15/16 0549 01/16/16 0526  NA 134* 136 134*  K 2.9* 3.1* 3.1*  CL 93* 96* 97*  CO2 29 28 28   GLUCOSE 201* 217* 166*  BUN 21* 18 31*  CREATININE  1.50* 1.30* 2.71*  CALCIUM 8.8* 8.3* 8.1*   GFR: Estimated Creatinine Clearance: 31.4 mL/min (by C-G formula based on SCr of 2.71 mg/dL). Liver Function Tests:  Recent Labs Lab 01/14/16 2320 01/15/16 0549  AST 36 30  ALT 28 25  ALKPHOS 67 60  BILITOT 1.1 1.5*  PROT 8.1 7.4  ALBUMIN 4.2 3.9    Recent Labs Lab 01/14/16 2320  LIPASE 32   No results for input(s): AMMONIA in the last 168 hours. Coagulation Profile: No results for input(s): INR, PROTIME in the last 168 hours. Cardiac Enzymes:  Recent Labs Lab 01/14/16 2320  TROPONINI 0.03*   BNP (last 3 results) No results for input(s): PROBNP in the last 8760 hours. HbA1C:  Recent Labs  01/15/16 0549  HGBA1C 7.8*   CBG:  Recent Labs Lab 01/15/16 1139 01/15/16 1642 01/15/16 2140 01/16/16 0734 01/16/16 1128  GLUCAP 186* 167* 184* 174* 202*   Lipid Profile:  Recent Labs  01/15/16 0549  CHOL 147  HDL 49  LDLCALC 90  TRIG 41  CHOLHDL 3.0   Thyroid Function Tests:  Recent Labs  01/14/16 2320  TSH 0.978   Anemia Panel:  Recent Labs  01/14/16 2320  VITAMINB12 337   Urine analysis:    Component Value Date/Time   COLORURINE YELLOW 01/14/2016 2335   APPEARANCEUR CLEAR 01/14/2016 2335   LABSPEC 1.015 01/14/2016 2335   PHURINE 7.0 01/14/2016 2335   GLUCOSEU 500 (A) 01/14/2016 2335   HGBUR SMALL (A) 01/14/2016 2335   BILIRUBINUR NEGATIVE 01/14/2016 2335   KETONESUR 15 (A) 01/14/2016 2335   PROTEINUR 100 (A) 01/14/2016 2335   NITRITE NEGATIVE 01/14/2016 2335   LEUKOCYTESUR NEGATIVE 01/14/2016 2335   Sepsis Labs: @LABRCNTIP (procalcitonin:4,lacticidven:4)  ) Recent Results (from the past 240 hour(s))  MRSA PCR Screening     Status: None   Collection Time: 01/15/16  2:43 AM  Result Value Ref Range Status   MRSA by PCR NEGATIVE NEGATIVE Final    Comment:        The GeneXpert MRSA Assay (FDA approved for NASAL specimens only), is one component of a comprehensive MRSA  colonization surveillance program. It is not intended to diagnose MRSA infection nor to guide or monitor treatment for MRSA infections.          Radiology Studies: Mr Shirlee LatchMra Head ZOWo Contrast  Result Date: 01/15/2016 CLINICAL DATA:  60 y/o M; vertigo, nausea, and vomiting. Hypertension. EXAM: MRA HEAD WITHOUT CONTRAST TECHNIQUE: Angiographic images of the Circle of Willis were obtained using MRA technique without intravenous contrast. COMPARISON:  MRI brain 01/15/2016 FINDINGS: Internal carotid arteries: Patent. 2 mm hyperintense focus anterior and lateral to the ophthalmic segment of right internal carotid artery, series 3, image 97, possibly tortuosity the ophthalmic origin versus tiny aneurysm. Anterior cerebral arteries:  Patent. Middle cerebral arteries: Patent. Anterior  communicating artery: Not identified, hypoplastic or absent. Posterior communicating arteries: Not identified, hypoplastic or absent. Posterior cerebral arteries:  Patent. Basilar artery:  Patent.  Patent bilateral SCA and AICA Vertebral arteries:  Patent.  Patent bilateral PICA. No evidence of high-grade stenosis or large vessel occlusion. IMPRESSION: 1. No evidence of high-grade stenosis or large vessel occlusion of the circle of Willis. 2. 2 mm hyperintense focus anterior and lateral to the ophthalmic segment of right ICA possibly tortuosity ophthalmic artery origin versus tiny aneurysm. Electronically Signed   By: Mitzi Hansen M.D.   On: 01/15/2016 22:18   [name] Contrast  Result Date: 01/15/2016 CLINICAL DATA:  60 y/o M; nausea and vomiting today with vertigo and history of hypertension. No known injury. EXAM: MRI HEAD WITHOUT CONTRAST TECHNIQUE: Multiplanar, multiecho pulse sequences of the brain and surrounding structures were obtained without intravenous contrast. COMPARISON:  None. FINDINGS: Brain: There is a 3 mm focus of diffusion restriction within the right inferior cerebellar hemisphere consistent  with acute infarction. No abnormal susceptibility hypointensity to indicate intracranial hemorrhage. There are numerous foci of T2 FLAIR hyperintense signal abnormality in subcortical and periventricular white matter probably representing chronic microvascular ischemic changes. Additionally there are confluent white matter T2 FLAIR hyperintense signal abnormalities within biparietal white matter an within the pons white matter. Extra-axial space: No hydrocephalus. No midline shift. No effacement of basilar cisterns. No extra-axial collection is identified. Proximal intracranial flow voids are maintained. No abnormality of the cervical medullary junction. Other: Extensive paranasal sinus mucosal thickening and patchy opacification. Bilateral maxillary sinus mucous retention cysts. No abnormal signal of the mastoid air cells. Orbits are unremarkable. Calvarium is unremarkable. IMPRESSION: 1. Small acute infarct within the right inferior cerebellar hemisphere. 2. Patchy white matter T2 FLAIR signal abnormalities in the bilateral parietal lobes and in brainstem white matter may represent developing acute hypertensive encephalopathy/PRES. 3. Extensive paranasal sinus disease. These results were called by telephone at the time of interpretation on 01/15/2016 at 3:29 am to Dr. Rochele Raring , who verbally acknowledged these results. Electronically Signed   By: Mitzi Hansen M.D.   On: 01/15/2016 03:31   US Carotid Bilateral (at [facility] And [facility] Only)  Result Date: 01/15/2016 CLINICAL DATA:  60 year old male with a history of dizziness. Cardiovascular risk factors include hypertension, known stroke/ TIA, tobacco use, diabetes. EXAM: BILATERAL CAROTID DUPLEX ULTRASOUND TECHNIQUE: Wallace Cullens scale imaging, color Doppler and duplex ultrasound were performed of bilateral carotid and vertebral arteries in the neck. COMPARISON:  No prior duplex FINDINGS: Criteria: Quantification of carotid stenosis is based on velocity  parameters that correlate the residual internal carotid diameter with NASCET-based stenosis levels, using the diameter of the distal internal carotid lumen as the denominator for stenosis measurement. The following velocity measurements were obtained: RIGHT ICA:  Systolic 61 cm/sec, Diastolic 15 cm/sec CCA:  72 cm/sec SYSTOLIC ICA/CCA RATIO:  0.9 ECA:  60 a cm/sec LEFT ICA:  Systolic 81 cm/sec, Diastolic 26 cm/sec CCA:  72 cm/sec SYSTOLIC ICA/CCA RATIO:  1.1 ECA:  77 cm/sec Right Brachial SBP: Not acquired Left Brachial SBP: Not acquired RIGHT CAROTID ARTERY: No significant calcifications of the right common carotid artery. Intermediate waveform maintained. Heterogeneous and partially calcified plaque at the right carotid bifurcation. No significant lumen shadowing. Low resistance waveform of the right ICA. Mild tortuosity RIGHT VERTEBRAL ARTERY: Antegrade flow with low resistance waveform. LEFT CAROTID ARTERY: No significant calcifications of the left common carotid artery. Intermediate waveform maintained. Heterogeneous and partially calcified plaque at the left carotid  bifurcation without significant lumen shadowing. Low resistance waveform of the left ICA. Mild tortuosity LEFT VERTEBRAL ARTERY:  Antegrade flow with low resistance waveform. IMPRESSION: Color duplex indicates minimal heterogeneous and calcified plaque, with no hemodynamically significant stenosis by duplex criteria in the extracranial cerebrovascular circulation. Signed, Yvone Neu. Loreta Ave, DO [facility] Radiology Electronically Signed   By: Gilmer Mor D.O.   On: 01/15/2016 16:05   Dg Chest Port 1 View  Result Date: 01/15/2016 CLINICAL DATA:  Cerebellar stroke. Hypertensive urgency. Diabetes and hypertension. EXAM: PORTABLE CHEST 1 VIEW COMPARISON:  None. FINDINGS: Mild to moderate cardiomegaly. Mild tortuosity of thoracic aorta. Both lungs are clear. No evidence of pulmonary infiltrate, edema,  or pleural effusion. IMPRESSION: Cardiomegaly.  No active lung disease. Electronically Signed   By: Myles Rosenthal M.D.   On: 01/15/2016 17:09        Scheduled Meds: .  stroke: mapping our early stages of recovery book   Does not apply Once  . aspirin  300 mg Rectal Daily   Or  . aspirin  325 mg Oral Daily  . enoxaparin (LOVENOX) injection  40 mg Subcutaneous Q24H  . insulin aspart  0-15 Units Subcutaneous TID WC  . insulin aspart  0-5 Units Subcutaneous QHS  . insulin aspart  4 Units Subcutaneous TID WC  . metoprolol tartrate  25 mg Oral BID  . pravastatin  80 mg Oral q1800   Continuous Infusions: . sodium chloride 100 mL/hr at 01/16/16 1310     LOS: 1 day    Time spent: 25 minutes. Greater than 50% of this time was spent in direct contact with the patient coordinating care.     Chaya Jan, MD [facility] Pager [phone]  If 7PM-7AM, please contact night-coverage [email or web address] Password [email or web address] 01/16/2016, 2:28 PM

## 2016-01-17 DIAGNOSIS — E876 Hypokalemia: Secondary | ICD-10-CM

## 2016-01-17 LAB — BASIC METABOLIC PANEL
Anion gap: 8 (ref 5–15)
BUN: 30 mg/dL — AB (ref 6–20)
CHLORIDE: 101 mmol/L (ref 101–111)
CO2: 27 mmol/L (ref 22–32)
Calcium: 7.8 mg/dL — ABNORMAL LOW (ref 8.9–10.3)
Creatinine, Ser: 1.95 mg/dL — ABNORMAL HIGH (ref 0.61–1.24)
GFR calc Af Amer: 41 mL/min — ABNORMAL LOW (ref >60)
GFR calc non Af Amer: 35 mL/min — ABNORMAL LOW (ref >60)
GLUCOSE: 134 mg/dL — AB (ref 65–99)
POTASSIUM: 3.4 mmol/L — AB (ref 3.5–5.1)
Sodium: 136 mmol/L (ref 135–145)

## 2016-01-17 LAB — CBC
HEMATOCRIT: 36.6 % — AB (ref 39.0–52.0)
Hemoglobin: 12.2 g/dL — ABNORMAL LOW (ref 13.0–17.0)
MCH: 31.3 pg (ref 26.0–34.0)
MCHC: 33.3 g/dL (ref 30.0–36.0)
MCV: 93.8 fL (ref 78.0–100.0)
Platelets: 212 10*3/uL (ref 150–400)
RBC: 3.9 MIL/uL — ABNORMAL LOW (ref 4.22–5.81)
RDW: 13.9 % (ref 11.5–15.5)
WBC: 8.9 10*3/uL (ref 4.0–10.5)

## 2016-01-17 LAB — GLUCOSE, CAPILLARY: Glucose-Capillary: 138 mg/dL — ABNORMAL HIGH (ref 65–99)

## 2016-01-17 LAB — RPR: RPR: NONREACTIVE

## 2016-01-17 LAB — HIV ANTIBODY (ROUTINE TESTING W REFLEX): HIV Screen 4th Generation wRfx: NONREACTIVE

## 2016-01-17 MED ORDER — HYDRALAZINE HCL 25 MG PO TABS
25.0000 mg | ORAL_TABLET | Freq: Four times a day (QID) | ORAL | Status: DC | PRN
  Administered 2016-01-17: 25 mg via ORAL
  Filled 2016-01-17: qty 1

## 2016-01-17 MED ORDER — ASPIRIN EC 81 MG PO TBEC: 81.0000 mg | DELAYED_RELEASE_TABLET | Freq: Every day | ORAL | Status: DC

## 2016-01-17 MED ORDER — METOPROLOL TARTRATE 25 MG PO TABS: 25.0000 mg | ORAL_TABLET | Freq: Two times a day (BID) | ORAL | 2 refills | Status: DC

## 2016-01-17 MED ORDER — PRAVASTATIN SODIUM 80 MG PO TABS: 80.0000 mg | ORAL_TABLET | Freq: Every day | ORAL | 2 refills | Status: DC

## 2016-01-17 MED ORDER — INSULIN DETEMIR 100 UNIT/ML ~~LOC~~ SOLN
8.0000 [IU] | Freq: Every day | SUBCUTANEOUS | 11 refills | Status: DC
Start: 2016-01-17 — End: 2018-02-21

## 2016-01-17 NOTE — Progress Notes (Signed)
Patient states understanding of discharge instructions, prescriptions given 

## 2016-01-17 NOTE — Discharge Summary (Signed)
Physician Discharge Summary  Matthew Robles BJY:[id number] DOB: 09/30/1955 DOA: 01/14/2016  PCP: No PCP Per Patient  Admit date: 01/14/2016 Discharge date: 01/17/2016  Time spent: 45 minutes  Recommendations for Outpatient Follow-up:  -Will be discharged home today. -Has appointment with new PCP in 2 days to recheck renal function, DM and BP.   Discharge Diagnoses:  Active Problems:   Hypertensive urgency   Cerebellar stroke (HCC)   DM (diabetes mellitus), type 2 with neurological complications (HCC)   Hypokalemia   ARF (acute renal failure) (HCC)   Discharge Condition: Stable and improved  Filed Weights   01/15/16 0456 01/16/16 0500 01/16/16 1453  Weight: 83.2 kg (183 lb 6.8 oz) 84.7 kg (186 lb 11.7 oz) 85.8 kg (189 lb 2.5 oz)    History of present illness:  As per Dr. Sharl Ma on 8/24: Matthew Robles  is a 60 y.o. male, With history of hypertension, not on medications, diabetes mellitus who came to the hospital after patient started feeling dizzy this afternoon. Patient says that he stopped at [facility] to have a snack but was not able to eat because of constant dizziness. He called his brother brought him to the ED. Patient has history of hypertension but does not take medications as he prefers to control it naturally. Patient had episode of nausea and vomiting the dizziness today. He denies any focal weakness or tingling of extremities. No slurred speech, no vision changes. No previous history of stroke or TIA.  In the ED patient was found to be in hypertensive urgency, started on labetalol drip. MRI brain was obtained that showed small acute infarct within the right inferior cerebellar hemisphere  Patchy white matter T2 FLAIR signal abnormalities in the bilateral parietal lobes and in brainstem white matter may represent developing acute hypertensive encephalopathy/PRES.  Hospital Course:  Hypertensive Emergency -Required labetalol drip initially. -BP now around  160/90. Will not lower any more given acute CVA and risk of hypoperfusion. -Has been started on metoprolol. -Will not add any further agents for now, but will need close follow up with PCP upon DC.  Acute Cerebellar CVA -ECHO: Study Conclusions  Left ventricle: The cavity size was mildly dilated. There was moderate concentric hypertrophy. Systolic function was normal. The estimated ejection fraction was in the range of 50% to 55%. Wall motion was normal; there were no regional wall motion abnormalities. Left ventricular diastolic function parameters were normal. -Carotid dopplers: IMPRESSION: Color duplex indicates minimal heterogeneous and calcified plaque, with no hemodynamically significant stenosis by duplex criteria in the extracranial cerebrovascular circulation. -Started on ASA for secondary stroke prevention. -Statin for LDL of 90. -Needs good risk factor modification. -PT recs HHPT and 24 hour supervision.  DM II -Uncontrolled. -A1C of 7.8. -Cannot start metformin given ARF and risk for lactic acidosis. -Will start levemir low dose. -Will need close follow up for insulin adjustment.  Hypokalemia -Replaced.  ARF -Cr down to 1.7 today. -Would not be surprised if he has some degree of CKD given his uncontrolled HTN and DM.     Procedures:  As above   Consultations:  Neurology, Dr. Gerilyn Pilgrim  Discharge Instructions  Discharge Instructions    Diet - low sodium heart healthy    Complete by:  As directed   Increase activity slowly    Complete by:  As directed       Medication List    TAKE these medications   insulin detemir 100 UNIT/ML injection Commonly known as:  LEVEMIR Inject 0.08 mLs (  8 Units total) into the skin at bedtime.   metoprolol tartrate 25 MG tablet Commonly known as:  LOPRESSOR Take 1 tablet (25 mg total) by mouth 2 (two) times daily.   pravastatin 80 MG tablet Commonly known as:  PRAVACHOL Take 1 tablet (80 mg total)  by mouth daily at 6 PM.      No Known Allergies Follow-up Information    [facility] .   Why:  Appointment Monday 01/19/2016 at 2 pm -have your creatinine level checked Contact information: [address] [place] [address] [phone]        [facility] .   Why:  will call you to set up physical therapy at home Contact information: [address] [place] [address] [phone]            The results of significant diagnostics from this hospitalization (including imaging, microbiology, ancillary and laboratory) are listed below for reference.    Significant Diagnostic Studies: Mr [name] VH Contrast  Result Date: 01/15/2016 CLINICAL DATA:  60 y/o M; vertigo, nausea, and vomiting. Hypertension. EXAM: MRA HEAD WITHOUT CONTRAST TECHNIQUE: Angiographic images of the Circle of Willis were obtained using MRA technique without intravenous contrast. COMPARISON:  MRI brain 01/15/2016 FINDINGS: Internal carotid arteries: Patent. 2 mm hyperintense focus anterior and lateral to the ophthalmic segment of right internal carotid artery, series 3, image 97, possibly tortuosity the ophthalmic origin versus tiny aneurysm. Anterior cerebral arteries:  Patent. Middle cerebral arteries: Patent. Anterior communicating artery: Not identified, hypoplastic or absent. Posterior communicating arteries: Not identified, hypoplastic or absent. Posterior cerebral arteries:  Patent. Basilar artery:  Patent.  Patent bilateral SCA and AICA Vertebral arteries:  Patent.  Patent bilateral PICA. No evidence of high-grade stenosis or large vessel occlusion. IMPRESSION: 1. No evidence of high-grade stenosis or large vessel occlusion of the circle of Willis. 2. 2 mm hyperintense focus anterior and lateral to the ophthalmic segment of right ICA possibly tortuosity ophthalmic artery origin versus tiny aneurysm. Electronically Signed   By: Mitzi Hansen M.D.   On: 01/15/2016  22:18   [name] Contrast  Result Date: 01/15/2016 CLINICAL DATA:  60 y/o M; nausea and vomiting today with vertigo and history of hypertension. No known injury. EXAM: MRI HEAD WITHOUT CONTRAST TECHNIQUE: Multiplanar, multiecho pulse sequences of the brain and surrounding structures were obtained without intravenous contrast. COMPARISON:  None. FINDINGS: Brain: There is a 3 mm focus of diffusion restriction within the right inferior cerebellar hemisphere consistent with acute infarction. No abnormal susceptibility hypointensity to indicate intracranial hemorrhage. There are numerous foci of T2 FLAIR hyperintense signal abnormality in subcortical and periventricular white matter probably representing chronic microvascular ischemic changes. Additionally there are confluent white matter T2 FLAIR hyperintense signal abnormalities within biparietal white matter an within the pons white matter. Extra-axial space: No hydrocephalus. No midline shift. No effacement of basilar cisterns. No extra-axial collection is identified. Proximal intracranial flow voids are maintained. No abnormality of the cervical medullary junction. Other: Extensive paranasal sinus mucosal thickening and patchy opacification. Bilateral maxillary sinus mucous retention cysts. No abnormal signal of the mastoid air cells. Orbits are unremarkable. Calvarium is unremarkable. IMPRESSION: 1. Small acute infarct within the right inferior cerebellar hemisphere. 2. Patchy white matter T2 FLAIR signal abnormalities in the bilateral parietal lobes and in brainstem white matter may represent developing acute hypertensive encephalopathy/PRES. 3. Extensive paranasal sinus disease. These results were called by telephone at the time of interpretation on 01/15/2016 at 3:29  am to Dr. Rochele RaringKRISTEN WARD , who verbally acknowledged these results. Electronically Signed   By: Mitzi HansenLance  Furusawa-Stratton M.D.   On: 01/15/2016 03:31   [place] Carotid Bilateral (at [facility]  Only)  Result Date: 01/15/2016 CLINICAL DATA:  60 year old male with a history of dizziness. Cardiovascular risk factors include hypertension, known stroke/ TIA, tobacco use, diabetes. EXAM: BILATERAL CAROTID DUPLEX ULTRASOUND TECHNIQUE: Wallace CullensGray scale imaging, color Doppler and duplex ultrasound were performed of bilateral carotid and vertebral arteries in the neck. COMPARISON:  No prior duplex FINDINGS: Criteria: Quantification of carotid stenosis is based on velocity parameters that correlate the residual internal carotid diameter with NASCET-based stenosis levels, using the diameter of the distal internal carotid lumen as the denominator for stenosis measurement. The following velocity measurements were obtained: RIGHT ICA:  Systolic 61 cm/sec, Diastolic 15 cm/sec CCA:  72 cm/sec SYSTOLIC ICA/CCA RATIO:  0.9 ECA:  60 a cm/sec LEFT ICA:  Systolic 81 cm/sec, Diastolic 26 cm/sec CCA:  72 cm/sec SYSTOLIC ICA/CCA RATIO:  1.1 ECA:  77 cm/sec Right Brachial SBP: Not acquired Left Brachial SBP: Not acquired RIGHT CAROTID ARTERY: No significant calcifications of the right common carotid artery. Intermediate waveform maintained. Heterogeneous and partially calcified plaque at the right carotid bifurcation. No significant lumen shadowing. Low resistance waveform of the right ICA. Mild tortuosity RIGHT VERTEBRAL ARTERY: Antegrade flow with low resistance waveform. LEFT CAROTID ARTERY: No significant calcifications of the left common carotid artery. Intermediate waveform maintained. Heterogeneous and partially calcified plaque at the left carotid bifurcation without significant lumen shadowing. Low resistance waveform of the left ICA. Mild tortuosity LEFT VERTEBRAL ARTERY:  Antegrade flow with low resistance waveform. IMPRESSION: Color duplex indicates minimal heterogeneous and calcified plaque, with no hemodynamically significant stenosis by duplex criteria in the extracranial cerebrovascular circulation. Signed, Yvone NeuJaime S.  Loreta AveWagner, DO [facility] Electronically Signed   By: Gilmer MorJaime  Wagner D.O.   On: 01/15/2016 16:05   Dg Chest Port 1 View  Result Date: 01/15/2016 CLINICAL DATA:  Cerebellar stroke. Hypertensive urgency. Diabetes and hypertension. EXAM: PORTABLE CHEST 1 VIEW COMPARISON:  None. FINDINGS: Mild to moderate cardiomegaly. Mild tortuosity of thoracic aorta. Both lungs are clear. No evidence of pulmonary infiltrate, edema, or pleural effusion. IMPRESSION: Cardiomegaly.  No active lung disease. Electronically Signed   By: Myles RosenthalJohn  Stahl M.D.   On: 01/15/2016 17:09    Microbiology: Recent Results (from the past 240 hour(s))  MRSA PCR Screening     Status: None   Collection Time: 01/15/16  2:43 AM  Result Value Ref Range Status   MRSA by PCR NEGATIVE NEGATIVE Final    Comment:        The GeneXpert MRSA Assay (FDA approved for NASAL specimens only), is one component of a comprehensive MRSA colonization surveillance program. It is not intended to diagnose MRSA infection nor to guide or monitor treatment for MRSA infections.      Labs: Basic Metabolic Panel:  Recent Labs Lab 01/14/16 2320 01/15/16 0549 01/16/16 0526 01/17/16 0548  NA 134* 136 134* 136  K 2.9* 3.1* 3.1* 3.4*  CL 93* 96* 97* 101  CO2 29 28 28 27   GLUCOSE 201* 217* 166* 134*  BUN 21* 18 31* 30*  CREATININE 1.50* 1.30* 2.71* 1.95*  CALCIUM 8.8* 8.3* 8.1* 7.8*  MG  --   --  1.6*  --    Liver Function Tests:  Recent Labs Lab 01/14/16 2320 01/15/16 0549  AST 36 30  ALT 28 25  ALKPHOS 67 60  BILITOT 1.1 1.5*  PROT 8.1 7.4  ALBUMIN 4.2 3.9    Recent Labs Lab 01/14/16 2320  LIPASE 32   No results for input(s): AMMONIA in the last 168 hours. CBC:  Recent Labs Lab 01/14/16 2320 01/16/16 0526 01/17/16 0548  WBC 13.1* 10.0 8.9  HGB 13.9 12.2* 12.2*  HCT 40.0 35.6* 36.6*  MCV 92.4 93.4 93.8  PLT 246 210 212   Cardiac Enzymes:  Recent Labs Lab  01/14/16 2320  TROPONINI 0.03*   BNP: BNP (last 3 results) No results for input(s): BNP in the last 8760 hours.  ProBNP (last 3 results) No results for input(s): PROBNP in the last 8760 hours.  CBG:  Recent Labs Lab 01/16/16 0734 01/16/16 1128 01/16/16 1647 01/16/16 2026 01/17/16 0745  GLUCAP 174* 202* 233* 88 138*       Signed:  HERNANDEZ ACOSTA,ESTELA  [facility] Pager: [phone] 01/17/2016, 10:40 AM

## 2016-01-17 NOTE — Progress Notes (Signed)
Patient calls nurse to room and states that he doesn't want any more IV fluid that his arm is swollen and he doesn't fell like he needs it.  Patients arm has been swollen per report due to an infiltrated IV.  Patients arm is currently the same size it was during shift assessment.  Will notify MD via text page and continue to monitor the patient.

## 2016-01-19 LAB — HOMOCYSTEINE: Homocysteine: 19 umol/L — ABNORMAL HIGH (ref 0.0–15.0)

## 2017-01-17 ENCOUNTER — Emergency Department (HOSPITAL_COMMUNITY): Payer: No Typology Code available for payment source

## 2017-01-17 ENCOUNTER — Encounter (HOSPITAL_COMMUNITY): Payer: Self-pay | Admitting: Adult Health

## 2017-01-17 ENCOUNTER — Emergency Department (HOSPITAL_COMMUNITY)
Admission: EM | Admit: 2017-01-17 | Discharge: 2017-01-17 | Discharge status: Against Medical Advice | Payer: No Typology Code available for payment source | Attending: Emergency Medicine | Admitting: Emergency Medicine

## 2017-01-17 DIAGNOSIS — Y999 Unspecified external cause status: Secondary | ICD-10-CM | POA: Diagnosis not present

## 2017-01-17 DIAGNOSIS — Y939 Activity, unspecified: Secondary | ICD-10-CM | POA: Insufficient documentation

## 2017-01-17 DIAGNOSIS — M542 Cervicalgia: Secondary | ICD-10-CM | POA: Insufficient documentation

## 2017-01-17 DIAGNOSIS — I169 Hypertensive crisis, unspecified: Secondary | ICD-10-CM | POA: Insufficient documentation

## 2017-01-17 DIAGNOSIS — S4992XA Unspecified injury of left shoulder and upper arm, initial encounter: Secondary | ICD-10-CM

## 2017-01-17 DIAGNOSIS — Y92410 Unspecified street and highway as the place of occurrence of the external cause: Secondary | ICD-10-CM | POA: Diagnosis not present

## 2017-01-17 LAB — CBC WITH DIFFERENTIAL/PLATELET
BASOS ABS: 0.1 10*3/uL (ref 0.0–0.1)
Basophils Relative: 1 %
EOS ABS: 0.6 10*3/uL (ref 0.0–0.7)
Eosinophils Relative: 8 %
HCT: 38.6 % — ABNORMAL LOW (ref 39.0–52.0)
HEMOGLOBIN: 13.4 g/dL (ref 13.0–17.0)
LYMPHS PCT: 28 %
Lymphs Abs: 2.2 10*3/uL (ref 0.7–4.0)
MCH: 31.7 pg (ref 26.0–34.0)
MCHC: 34.7 g/dL (ref 30.0–36.0)
MCV: 91.3 fL (ref 78.0–100.0)
Monocytes Absolute: 0.7 10*3/uL (ref 0.1–1.0)
Monocytes Relative: 8 %
Neutro Abs: 4.4 10*3/uL (ref 1.7–7.7)
Neutrophils Relative %: 55 %
PLATELETS: 246 10*3/uL (ref 150–400)
RBC: 4.23 MIL/uL (ref 4.22–5.81)
RDW: 13.8 % (ref 11.5–15.5)
WBC: 8 10*3/uL (ref 4.0–10.5)

## 2017-01-17 LAB — BASIC METABOLIC PANEL
Anion gap: 11 (ref 5–15)
BUN: 38 mg/dL — AB (ref 6–20)
CALCIUM: 9.3 mg/dL (ref 8.9–10.3)
CO2: 28 mmol/L (ref 22–32)
CREATININE: 2.24 mg/dL — AB (ref 0.61–1.24)
Chloride: 98 mmol/L — ABNORMAL LOW (ref 101–111)
GFR calc Af Amer: 34 mL/min — ABNORMAL LOW (ref >60)
GFR, EST NON AFRICAN AMERICAN: 30 mL/min — AB (ref >60)
Glucose, Bld: 152 mg/dL — ABNORMAL HIGH (ref 65–99)
Potassium: 3.2 mmol/L — ABNORMAL LOW (ref 3.5–5.1)
SODIUM: 137 mmol/L (ref 135–145)

## 2017-01-17 LAB — TROPONIN I: TROPONIN I: 0.04 ng/mL — AB (ref <0.03)

## 2017-01-17 MED ORDER — METOPROLOL TARTRATE 25 MG PO TABS: 25.0000 mg | ORAL_TABLET | Freq: Two times a day (BID) | ORAL | 0 refills | Status: DC

## 2017-01-17 MED ORDER — DEXTROSE 5 % IV SOLN
0.5000 mg/min | INTRAVENOUS | Status: DC
  Filled 2017-01-17: qty 100

## 2017-01-17 MED ORDER — ASPIRIN 325 MG PO TABS
325.0000 mg | ORAL_TABLET | Freq: Once | ORAL | Status: AC
  Administered 2017-01-17: 325 mg via ORAL
  Filled 2017-01-17: qty 1

## 2017-01-17 NOTE — ED Notes (Signed)
CRITICAL VALUE ALERT  Critical Value:  Troponin 0.04  Date & Time Notied:  01/17/17 1155  Provider Notified:dr zackowski  Orders Received/Actions taken: value taken by c edwards rn

## 2017-01-17 NOTE — ED Notes (Signed)
Pt verbalized understanding of leaving ama with his bp as elevated as it is.  States "I'm not really worried about it."  Advised pt to take this seriously as his kidney function and heart were being affected and he could have bleeding in the brain r/t his headache.  Left ambulatory in no distress after reviewing rx and follow up.

## 2017-01-17 NOTE — ED Notes (Signed)
Spoke extensively with pt about the importance of blood pressure control and the dangers of having a bp this high.  Pt did not seem to acknowledge the fact his bp was important and continued to focus on his shoulder pain.  Pt states he looked up the side effects from the medications he was given and noted they could cause heart attack.  Advised pt his bp was more likely to cause problems versus the medications.
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
## 2017-01-17 NOTE — ED Notes (Signed)
Dr. Deretha Emory in to speak with pt.
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
## 2017-01-17 NOTE — Discharge Instructions (Signed)
Return to ER if you change your mind about your treatment plan or your symptoms worsen.  Tylenol for your shoulder pain and apply ice on/off to your shoulder

## 2017-01-17 NOTE — ED Notes (Signed)
Pt told Micah from CT he was not going for the scan.

## 2017-01-17 NOTE — ED Notes (Signed)
Notified pt Matthew Robles had ordered Labetalol for his blood pressure.  Pt states he does not want anything for his blood pressure and he is not staying in the hospital.  Informed pt he could have a stroke, MI, or even die if he continued to let his blood pressure be this high.  Pt verbalized understanding and stated he did not want any treatment for this.  Matthew Robles notified and spoke with pt extensively as well.  Pt also refusing CT at this time.

## 2018-02-21 ENCOUNTER — Other Ambulatory Visit: Payer: Self-pay

## 2018-02-21 ENCOUNTER — Encounter (HOSPITAL_COMMUNITY): Payer: Self-pay | Admitting: Emergency Medicine

## 2018-02-21 ENCOUNTER — Emergency Department (HOSPITAL_COMMUNITY): Payer: Self-pay

## 2018-02-21 ENCOUNTER — Inpatient Hospital Stay (HOSPITAL_COMMUNITY)
Admission: EM | Admit: 2018-02-21 | Discharge: 2018-02-25 | DRG: 304 | Disposition: A | Discharge status: Home / Self Care | Payer: Self-pay | Attending: Family Medicine | Admitting: Family Medicine

## 2018-02-21 DIAGNOSIS — R1111 Vomiting without nausea: Secondary | ICD-10-CM

## 2018-02-21 DIAGNOSIS — N170 Acute kidney failure with tubular necrosis: Secondary | ICD-10-CM

## 2018-02-21 DIAGNOSIS — Z9114 Patient's other noncompliance with medication regimen: Secondary | ICD-10-CM

## 2018-02-21 DIAGNOSIS — R112 Nausea with vomiting, unspecified: Secondary | ICD-10-CM | POA: Diagnosis present

## 2018-02-21 DIAGNOSIS — N184 Chronic kidney disease, stage 4 (severe): Secondary | ICD-10-CM | POA: Diagnosis present

## 2018-02-21 DIAGNOSIS — R1110 Vomiting, unspecified: Secondary | ICD-10-CM | POA: Diagnosis present

## 2018-02-21 DIAGNOSIS — E1149 Type 2 diabetes mellitus with other diabetic neurological complication: Secondary | ICD-10-CM | POA: Diagnosis present

## 2018-02-21 DIAGNOSIS — I161 Hypertensive emergency: Principal | ICD-10-CM | POA: Diagnosis present

## 2018-02-21 DIAGNOSIS — N189 Chronic kidney disease, unspecified: Secondary | ICD-10-CM

## 2018-02-21 DIAGNOSIS — I160 Hypertensive urgency: Secondary | ICD-10-CM

## 2018-02-21 DIAGNOSIS — N179 Acute kidney failure, unspecified: Secondary | ICD-10-CM

## 2018-02-21 DIAGNOSIS — E1165 Type 2 diabetes mellitus with hyperglycemia: Secondary | ICD-10-CM | POA: Diagnosis present

## 2018-02-21 DIAGNOSIS — Z8673 Personal history of transient ischemic attack (TIA), and cerebral infarction without residual deficits: Secondary | ICD-10-CM

## 2018-02-21 DIAGNOSIS — I5031 Acute diastolic (congestive) heart failure: Secondary | ICD-10-CM | POA: Diagnosis present

## 2018-02-21 DIAGNOSIS — N183 Chronic kidney disease, stage 3 unspecified: Secondary | ICD-10-CM | POA: Diagnosis present

## 2018-02-21 DIAGNOSIS — N181 Chronic kidney disease, stage 1: Secondary | ICD-10-CM

## 2018-02-21 DIAGNOSIS — E876 Hypokalemia: Secondary | ICD-10-CM | POA: Diagnosis present

## 2018-02-21 DIAGNOSIS — I509 Heart failure, unspecified: Secondary | ICD-10-CM

## 2018-02-21 DIAGNOSIS — I130 Hypertensive heart and chronic kidney disease with heart failure and stage 1 through stage 4 chronic kidney disease, or unspecified chronic kidney disease: Secondary | ICD-10-CM | POA: Diagnosis present

## 2018-02-21 DIAGNOSIS — F17210 Nicotine dependence, cigarettes, uncomplicated: Secondary | ICD-10-CM | POA: Diagnosis present

## 2018-02-21 DIAGNOSIS — Z91148 Patient's other noncompliance with medication regimen for other reason: Secondary | ICD-10-CM

## 2018-02-21 DIAGNOSIS — E1122 Type 2 diabetes mellitus with diabetic chronic kidney disease: Secondary | ICD-10-CM | POA: Diagnosis present

## 2018-02-21 HISTORY — DX: Cerebral infarction, unspecified: I63.9

## 2018-02-21 LAB — CBC WITH DIFFERENTIAL/PLATELET
BASOS ABS: 0.1 10*3/uL (ref 0.0–0.1)
Basophils Relative: 1 %
Eosinophils Absolute: 0.6 10*3/uL (ref 0.0–0.7)
Eosinophils Relative: 3 %
HEMATOCRIT: 37.8 % — AB (ref 39.0–52.0)
HEMOGLOBIN: 12.7 g/dL — AB (ref 13.0–17.0)
LYMPHS PCT: 6 %
Lymphs Abs: 1.2 10*3/uL (ref 0.7–4.0)
MCH: 31.5 pg (ref 26.0–34.0)
MCHC: 33.6 g/dL (ref 30.0–36.0)
MCV: 93.8 fL (ref 78.0–100.0)
MONO ABS: 1.2 10*3/uL — AB (ref 0.1–1.0)
Monocytes Relative: 6 %
NEUTROS ABS: 16.2 10*3/uL — AB (ref 1.7–7.7)
NEUTROS PCT: 84 %
Platelets: 333 10*3/uL (ref 150–400)
RBC: 4.03 MIL/uL — AB (ref 4.22–5.81)
RDW: 14.2 % (ref 11.5–15.5)
WBC: 19.4 10*3/uL — AB (ref 4.0–10.5)

## 2018-02-21 LAB — BLOOD GAS, VENOUS
Acid-Base Excess: 1 mmol/L (ref 0.0–2.0)
Bicarbonate: 24.3 mmol/L (ref 20.0–28.0)
O2 Content: 4 L/min
O2 Saturation: 79.6 %
PATIENT TEMPERATURE: 36.6
PCO2 VEN: 49.2 mmHg (ref 44.0–60.0)
PH VEN: 7.34 (ref 7.250–7.430)
pO2, Ven: 51.1 mmHg — ABNORMAL HIGH (ref 32.0–45.0)

## 2018-02-21 LAB — MAGNESIUM: MAGNESIUM: 2.1 mg/dL (ref 1.7–2.4)

## 2018-02-21 LAB — COMPREHENSIVE METABOLIC PANEL
ALBUMIN: 4.2 g/dL (ref 3.5–5.0)
ALT: 26 U/L (ref 0–44)
ANION GAP: 14 (ref 5–15)
AST: 30 U/L (ref 15–41)
Alkaline Phosphatase: 81 U/L (ref 38–126)
BUN: 29 mg/dL — AB (ref 8–23)
CHLORIDE: 101 mmol/L (ref 98–111)
CO2: 25 mmol/L (ref 22–32)
Calcium: 8.5 mg/dL — ABNORMAL LOW (ref 8.9–10.3)
Creatinine, Ser: 2.28 mg/dL — ABNORMAL HIGH (ref 0.61–1.24)
GFR calc Af Amer: 33 mL/min — ABNORMAL LOW (ref >60)
GFR, EST NON AFRICAN AMERICAN: 29 mL/min — AB (ref >60)
Glucose, Bld: 291 mg/dL — ABNORMAL HIGH (ref 70–99)
POTASSIUM: 2.5 mmol/L — AB (ref 3.5–5.1)
Sodium: 140 mmol/L (ref 135–145)
Total Bilirubin: 1 mg/dL (ref 0.3–1.2)
Total Protein: 8.5 g/dL — ABNORMAL HIGH (ref 6.5–8.1)

## 2018-02-21 LAB — RAPID URINE DRUG SCREEN, HOSP PERFORMED
Amphetamines: NOT DETECTED
BENZODIAZEPINES: NOT DETECTED
Barbiturates: NOT DETECTED
COCAINE: NOT DETECTED
Opiates: NOT DETECTED
Tetrahydrocannabinol: POSITIVE — AB

## 2018-02-21 LAB — GLUCOSE, CAPILLARY
Glucose-Capillary: 318 mg/dL — ABNORMAL HIGH (ref 70–99)
Glucose-Capillary: 315 mg/dL — ABNORMAL HIGH (ref 70–99)

## 2018-02-21 LAB — URINALYSIS, ROUTINE W REFLEX MICROSCOPIC
BACTERIA UA: NONE SEEN
BILIRUBIN URINE: NEGATIVE
Glucose, UA: >=500 mg/dL — AB
Hgb urine dipstick: NEGATIVE
KETONES UR: NEGATIVE mg/dL
LEUKOCYTES UA: NEGATIVE
NITRITE: NEGATIVE
PROTEIN: 100 mg/dL — AB
Specific Gravity, Urine: 1.008 (ref 1.005–1.030)
pH: 6 (ref 5.0–8.0)

## 2018-02-21 LAB — CBG MONITORING, ED: GLUCOSE-CAPILLARY: 260 mg/dL — AB (ref 70–99)

## 2018-02-21 LAB — BRAIN NATRIURETIC PEPTIDE: B Natriuretic Peptide: 795 pg/mL — ABNORMAL HIGH (ref 0.0–100.0)

## 2018-02-21 LAB — TROPONIN I: Troponin I: 0.03 ng/mL (ref <0.03)

## 2018-02-21 MED ORDER — ACETAMINOPHEN 325 MG PO TABS
650.0000 mg | ORAL_TABLET | ORAL | Status: DC | PRN
  Administered 2018-02-22 – 2018-02-23 (×2): 650 mg via ORAL
  Filled 2018-02-21 (×2): qty 2

## 2018-02-21 MED ORDER — NITROGLYCERIN IN D5W 200-5 MCG/ML-% IV SOLN
0.0000 ug/min | INTRAVENOUS | Status: DC
  Administered 2018-02-21: 80 ug/min via INTRAVENOUS
  Administered 2018-02-22 (×2): 75 ug/min via INTRAVENOUS
  Administered 2018-02-23: 130 ug/min via INTRAVENOUS
  Administered 2018-02-23: 95 ug/min via INTRAVENOUS
  Administered 2018-02-24 (×2): 105 ug/min via INTRAVENOUS
  Administered 2018-02-25: 5 ug/min via INTRAVENOUS
  Filled 2018-02-21 (×7): qty 250

## 2018-02-21 MED ORDER — SODIUM CHLORIDE 0.9 % IV SOLN
INTRAVENOUS | Status: DC
  Administered 2018-02-21: 150 mL/h via INTRAVENOUS

## 2018-02-21 MED ORDER — INSULIN ASPART 100 UNIT/ML ~~LOC~~ SOLN
0.0000 [IU] | Freq: Three times a day (TID) | SUBCUTANEOUS | Status: DC
  Administered 2018-02-22: 7 [IU] via SUBCUTANEOUS
  Administered 2018-02-22: 4 [IU] via SUBCUTANEOUS
  Administered 2018-02-22 – 2018-02-23 (×3): 7 [IU] via SUBCUTANEOUS
  Administered 2018-02-23 – 2018-02-25 (×6): 4 [IU] via SUBCUTANEOUS

## 2018-02-21 MED ORDER — FENTANYL CITRATE (PF) 100 MCG/2ML IJ SOLN
100.0000 ug | Freq: Once | INTRAMUSCULAR | Status: AC
  Administered 2018-02-21: 100 ug via INTRAVENOUS
  Filled 2018-02-21: qty 2

## 2018-02-21 MED ORDER — METOCLOPRAMIDE HCL 5 MG/ML IJ SOLN
5.0000 mg | Freq: Four times a day (QID) | INTRAMUSCULAR | Status: DC
  Administered 2018-02-21 – 2018-02-23 (×7): 5 mg via INTRAVENOUS
  Filled 2018-02-21 (×7): qty 2

## 2018-02-21 MED ORDER — LABETALOL HCL 5 MG/ML IV SOLN
0.5000 mg/min | INTRAVENOUS | Status: DC
  Administered 2018-02-21: 0.5 mg/min via INTRAVENOUS
  Administered 2018-02-21: 2 mg/min via INTRAVENOUS
  Administered 2018-02-21: 3 mg/min via INTRAVENOUS
  Administered 2018-02-22: 2 mg/min via INTRAVENOUS
  Filled 2018-02-21: qty 100
  Filled 2018-02-21: qty 80

## 2018-02-21 MED ORDER — SODIUM CHLORIDE 0.9% FLUSH: 3.0000 mL | INTRAVENOUS | Status: DC | PRN

## 2018-02-21 MED ORDER — POTASSIUM CHLORIDE CRYS ER 20 MEQ PO TBCR
40.0000 meq | EXTENDED_RELEASE_TABLET | Freq: Once | ORAL | Status: AC
  Administered 2018-02-21: 40 meq via ORAL
  Filled 2018-02-21: qty 2

## 2018-02-21 MED ORDER — ASPIRIN EC 81 MG PO TBEC
81.0000 mg | DELAYED_RELEASE_TABLET | Freq: Every day | ORAL | Status: DC
  Administered 2018-02-21 – 2018-02-25 (×5): 81 mg via ORAL
  Filled 2018-02-21 (×5): qty 1

## 2018-02-21 MED ORDER — SODIUM CHLORIDE 0.9 % IV SOLN
250.0000 mL | INTRAVENOUS | Status: DC | PRN
  Administered 2018-02-23: 250 mL via INTRAVENOUS

## 2018-02-21 MED ORDER — NITROGLYCERIN IN D5W 200-5 MCG/ML-% IV SOLN
5.0000 ug/min | Freq: Once | INTRAVENOUS | Status: AC
  Administered 2018-02-21: 5 ug/min via INTRAVENOUS
  Filled 2018-02-21: qty 250

## 2018-02-21 MED ORDER — FUROSEMIDE 10 MG/ML IJ SOLN
60.0000 mg | Freq: Two times a day (BID) | INTRAMUSCULAR | Status: DC
  Administered 2018-02-21 – 2018-02-22 (×2): 60 mg via INTRAVENOUS
  Filled 2018-02-21 (×2): qty 6

## 2018-02-21 MED ORDER — ENOXAPARIN SODIUM 40 MG/0.4ML ~~LOC~~ SOLN
40.0000 mg | SUBCUTANEOUS | Status: DC
  Administered 2018-02-21: 40 mg via SUBCUTANEOUS
  Filled 2018-02-21: qty 0.4

## 2018-02-21 MED ORDER — SODIUM CHLORIDE 0.9% FLUSH
3.0000 mL | Freq: Two times a day (BID) | INTRAVENOUS | Status: DC
  Administered 2018-02-21 – 2018-02-25 (×7): 3 mL via INTRAVENOUS

## 2018-02-21 MED ORDER — LABETALOL HCL 5 MG/ML IV SOLN
INTRAVENOUS | Status: AC
  Filled 2018-02-21: qty 20

## 2018-02-21 MED ORDER — ALBUTEROL SULFATE (2.5 MG/3ML) 0.083% IN NEBU: 2.5000 mg | INHALATION_SOLUTION | RESPIRATORY_TRACT | Status: DC | PRN

## 2018-02-21 MED ORDER — FUROSEMIDE 10 MG/ML IJ SOLN
40.0000 mg | Freq: Once | INTRAMUSCULAR | Status: AC
  Administered 2018-02-21: 40 mg via INTRAVENOUS
  Filled 2018-02-21: qty 4

## 2018-02-21 MED ORDER — LABETALOL HCL 5 MG/ML IV SOLN
20.0000 mg | Freq: Once | INTRAVENOUS | Status: AC
  Administered 2018-02-21: 20 mg via INTRAVENOUS
  Filled 2018-02-21: qty 4

## 2018-02-21 MED ORDER — POTASSIUM CHLORIDE 10 MEQ/100ML IV SOLN
10.0000 meq | INTRAVENOUS | Status: AC
  Administered 2018-02-21 (×3): 10 meq via INTRAVENOUS
  Filled 2018-02-21 (×3): qty 100

## 2018-02-21 MED ORDER — INSULIN ASPART 100 UNIT/ML ~~LOC~~ SOLN
0.0000 [IU] | Freq: Every day | SUBCUTANEOUS | Status: DC
  Administered 2018-02-21: 4 [IU] via SUBCUTANEOUS
  Administered 2018-02-24: 3 [IU] via SUBCUTANEOUS

## 2018-02-21 MED ORDER — ONDANSETRON HCL 4 MG/2ML IJ SOLN
4.0000 mg | Freq: Four times a day (QID) | INTRAMUSCULAR | Status: DC | PRN
  Administered 2018-02-22: 4 mg via INTRAVENOUS
  Filled 2018-02-21: qty 2

## 2018-02-21 MED ORDER — ONDANSETRON HCL 4 MG/2ML IJ SOLN
4.0000 mg | Freq: Once | INTRAMUSCULAR | Status: AC
  Administered 2018-02-21: 4 mg via INTRAVENOUS

## 2018-02-21 MED ORDER — SODIUM CHLORIDE 0.9 % IV BOLUS
1000.0000 mL | Freq: Once | INTRAVENOUS | Status: AC
  Administered 2018-02-21: 1000 mL via INTRAVENOUS

## 2018-02-21 MED ORDER — ONDANSETRON HCL 4 MG/2ML IJ SOLN
4.0000 mg | Freq: Once | INTRAMUSCULAR | Status: AC
  Administered 2018-02-21 (×2): 4 mg via INTRAVENOUS
  Filled 2018-02-21: qty 2

## 2018-02-21 MED ORDER — ONDANSETRON HCL 4 MG/2ML IJ SOLN
INTRAMUSCULAR | Status: AC
  Administered 2018-02-21: 4 mg via INTRAVENOUS
  Filled 2018-02-21: qty 2

## 2018-02-21 NOTE — ED Notes (Signed)
Mother at the bedside, pt state he is starting to feel better, not sweating at present

## 2018-02-21 NOTE — Progress Notes (Signed)
Pt continuously requesting food while actively vomiting. RN educated pt that he is on a clear liquid diet. Pt stated he has not eaten since yesterday morning. Rn adv pt that eating is not going to help with his vomiting.  Reglan given per MD order.

## 2018-02-21 NOTE — ED Provider Notes (Signed)
[facility] EMERGENCY DEPARTMENT Provider Note   CSN: [id number] Arrival date & time: 02/21/18  1237     History   Chief Complaint Chief Complaint  Patient presents with  . Hypertension    HPI Matthew Robles is a 62 y.o. male.  HPI   He presents for general weakness associated with shortness of breath and tiredness.  He states the symptoms started this morning.  He is supposed to be on multiple medications but is not taking any of them.  He states he does this by choice.  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  He denies headache, chest pain, focal weakness or paresthesia.  There are no other known modifying factors.  Past Medical History:  Diagnosis Date  . Diabetes mellitus without complication (HCC)   . Hypertension   . Stroke [facility])     Patient Active Problem List   Diagnosis Date Noted  . Hypertensive urgency 01/15/2016  . 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 Hypokalemia 01/15/2016  . ARF (acute renal failure) (HCC) 01/15/2016    History reviewed. No pertinent surgical history.      Home Medications    Prior to Admission medications   Not on File    Family History History reviewed. No pertinent family history.  Social History Social History   Tobacco Use  . Smoking status: Current Every Day Smoker    Packs/day: 1.00    Types: Cigarettes  . Smokeless tobacco: Never Used  Substance Use Topics  . Alcohol use: No    Comment: a couple of times a week  . Drug use: Yes    Types: Marijuana     Allergies   Patient has no known allergies.   Review of Systems Review of Systems  All other systems reviewed and are negative.    Physical Exam Updated Vital Signs BP (!) 195/125   Pulse 77   Temp 97.9 F (36.6 C) (Oral)   Resp 19   SpO2 96%   Physical Exam  Constitutional: He is oriented to person, place, and time. He appears well-developed and well-nourished.  He appears distressed (Uncomfortable).  HENT:  Head: Normocephalic and atraumatic.  Right Ear: External ear normal.  Left Ear: External ear normal.  Mouth/Throat: Oropharynx is clear and moist. No oropharyngeal exudate.  Eyes: Pupils are equal, round, and reactive to light. Conjunctivae and EOM are normal.  Neck: Normal range of motion and phonation normal. Neck supple.  Cardiovascular: Normal rate, regular rhythm and normal heart sounds.  Pulmonary/Chest: Effort normal and breath sounds normal. He exhibits no bony tenderness.  Abdominal: Soft. There is no tenderness.  Musculoskeletal: Normal range of motion.  Neurological: He is alert and oriented to person, place, and time. No cranial nerve deficit or sensory deficit. He exhibits normal muscle tone. Coordination normal.  No dysarthria, aphasia or nystagmus.  No ataxia.  Skin: Skin is warm and intact. He is diaphoretic.  Psychiatric: He has a normal mood and affect. His behavior is normal. Judgment and thought content normal.  Nursing note and vitals reviewed.    ED Treatments / Results  Labs (all labs ordered are listed, but only abnormal results are displayed) Labs Reviewed  COMPREHENSIVE METABOLIC PANEL - Abnormal; Notable for the following components:      Result Value   Potassium 2.5 (*)    Glucose, Bld 291 (*)    BUN 29 (*)    Creatinine, Ser 2.28 (*)  Calcium 8.5 (*)    Total Protein 8.5 (*)    GFR calc non Af Amer 29 (*)    GFR calc Af Amer 33 (*)    All other components within normal limits  TROPONIN I - Abnormal; Notable for the following components:   Troponin I 0.03 (*)    All other components within normal limits  CBC WITH DIFFERENTIAL/PLATELET - Abnormal; Notable for the following components:   WBC 19.4 (*)    RBC 4.03 (*)    Hemoglobin 12.7 (*)    HCT 37.8 (*)    Neutro Abs 16.2 (*)    Monocytes Absolute 1.2 (*)    All other components within normal limits  BRAIN NATRIURETIC PEPTIDE - Abnormal; Notable  for the following components:   B Natriuretic Peptide 795.0 (*)    All other components within normal limits  URINALYSIS, ROUTINE W REFLEX MICROSCOPIC - Abnormal; Notable for the following components:   Color, Urine STRAW (*)    Glucose, UA >=500 (*)    Protein, ur 100 (*)    All other components within normal limits  RAPID URINE DRUG SCREEN, HOSP PERFORMED - Abnormal; Notable for the following components:   Tetrahydrocannabinol POSITIVE (*)    All other components within normal limits  BLOOD GAS, VENOUS - Abnormal; Notable for the following components:   pO2, Ven 51.1 (*)    All other components within normal limits  CBG MONITORING, ED - Abnormal; Notable for the following components:   Glucose-Capillary 260 (*)    All other components within normal limits    EKG EKG Interpretation  Date/Time:  Tuesday February 21 2018 12:39:46 EDT Ventricular Rate:  106 PR Interval:    QRS Duration: 101 QT Interval:  362 QTC Calculation: 481 R Axis:   -11 Text Interpretation:  Sinus tachycardia Multiform ventricular premature complexes LAE, consider biatrial enlargement LVH with secondary repolarization abnormality Anterior Q waves, possibly due to LVH Since last tracing pvc are new Confirmed by Mancel Bale [phone]) on 02/21/2018 1:07:13 PM   Radiology Dg Chest Port 1 View  Result Date: 02/21/2018 CLINICAL DATA:  Initial evaluation for acute shortness of breath. EXAM: PORTABLE CHEST 1 VIEW COMPARISON:  Prior radiograph from 01/15/2016. FINDINGS: Cardiomegaly, stable from previous.  Mediastinal silhouette normal. Lungs normally inflated. Diffuse vascular congestion with interstitial prominence, consistent with pulmonary interstitial edema. No pleural effusion. No consolidative airspace disease. No pneumothorax. Osseous structures within normal limits. IMPRESSION: Cardiomegaly with mild diffuse pulmonary interstitial edema. Electronically Signed   By: Rise Mu M.D.   On: 02/21/2018  13:26    Procedures .Critical Care Performed by: Mancel Bale, MD Authorized by: Mancel Bale, MD   Critical care provider statement:    Critical care time (minutes):  55   Critical care start time:  02/21/2018 12:45 PM   Critical care end time:  02/21/2018 3:22 PM   Critical care time was exclusive of:  Separately billable procedures and treating other patients   Critical care was necessary to treat or prevent imminent or life-threatening deterioration of the following conditions:  Circulatory failure   Critical care was time spent personally by me on the following activities:  Blood draw for specimens, development of treatment plan with patient or surrogate, discussions with consultants, evaluation of patient's response to treatment, examination of patient, obtaining history from patient or surrogate, ordering and performing treatments and interventions, ordering and review of laboratory studies, pulse oximetry, re-evaluation of patient's condition, review of old charts and ordering and review  of radiographic studies   (including critical care time)  Medications Ordered in ED Medications  potassium chloride 10 mEq in 100 mL IVPB (10 mEq Intravenous New Bag/Given 02/21/18 1439)  labetalol (NORMODYNE,TRANDATE) 500 mg in dextrose 5 % 125 mL (4 mg/mL) infusion (3 mg/min Intravenous Rate/Dose Change 02/21/18 1533)  0.9 %  sodium chloride infusion (150 mL/hr Intravenous New Bag/Given 02/21/18 1458)  sodium chloride 0.9 % bolus 1,000 mL ( Intravenous Stopped 02/21/18 1408)  ondansetron (ZOFRAN) injection 4 mg (4 mg Intravenous Given 02/21/18 1327)  nitroGLYCERIN 50 mg in dextrose 5 % 250 mL (0.2 mg/mL) infusion (75 mcg/min Intravenous Rate/Dose Change 02/21/18 1546)  labetalol (NORMODYNE,TRANDATE) injection 20 mg (20 mg Intravenous Given 02/21/18 1327)  fentaNYL (SUBLIMAZE) injection 100 mcg (100 mcg Intravenous Given 02/21/18 1323)  furosemide (LASIX) injection 40 mg (40 mg Intravenous Given  02/21/18 1421)  potassium chloride SA (K-DUR,KLOR-CON) CR tablet 40 mEq (40 mEq Oral Given 02/21/18 1535)     Initial Impression / Assessment and Plan / ED Course  I have reviewed the triage vital signs and the nursing notes.  Pertinent labs & imaging results that were available during my care of the patient were reviewed by me and considered in my medical decision making (see chart for details).  Clinical Course as of Feb 21 1545  Tue Feb 21, 2018  1307 He has begun vomiting, additional medication ordered.    [EW]  1308 Will initiate treatment for hypertensive urgency.  IV nitroglycerin titrated, and labetalol.   [EW]  1338 Consistent with pulmonary edema, images reviewed by me  DG Chest Port 1 View [EW]  1340 Normal except elevated white count, low hemoglobin  CBC with Differential(!) [EW]  1340 Abnormal, THC present  Urine rapid drug screen (hosp performed)(!) [EW]  1340 Abnormal, high  CBG monitoring, ED(!) [EW]  1340 Abnormal, glucose and protein high  Urinalysis, Routine w reflex microscopic(!) [EW]  1412 Normal except potassium low, glucose high, BUN high, creatinine high, calcium low, total protein high, GFR low  Comprehensive metabolic panel(!!) [EW]  1413 Normal venous gas  Blood gas, venous(!) [EW]  1413 Elevated  Brain natriuretic peptide(!) [EW]  1414 No significant improvement in blood pressure yet, will add labetalol drip.   [EW]  1418 He is complaining of shortness of breath with oxygen saturation currently 100% on facemask oxygen.  Titrate oxygen by cannula if possible.  Have ordered labetalol drip for persistent hypertension.   [EW]  1517 Pressure now improving at 200/139 nitroglycerin at 40 mics and labetalol at 4.5 mg.  Patient no longer diaphoretic, and more comfortable.  He is breathing easily.  He has voided 1500 cc urine.   [EW]    Clinical Course User Index [EW] Mancel Bale, MD     Patient Vitals for the past 24 hrs:  BP Temp Temp src Pulse Resp  SpO2  02/21/18 1540 (!) 195/125 - - 77 19 96 %  02/21/18 1530 (!) 197/139 - - 80 (!) 22 95 %  02/21/18 1520 (!) 203/149 - - 77 13 96 %  02/21/18 1515 (!) 200/139 - - 84 (!) 22 94 %  02/21/18 1510 (!) 214/140 - - 82 20 97 %  02/21/18 1500 (!) 226/140 - - 84 (!) 24 93 %  02/21/18 1445 (!) 231/154 - - 83 19 91 %  02/21/18 1430 (!) 224/138 - - 92 (!) 26 91 %  02/21/18 1415 (!) 238/157 - - 87 (!) 25 100 %  02/21/18 1400 (!) 223/155 - -  83 18 99 %  02/21/18 1345 (!) 217/147 - - 81 19 94 %  02/21/18 1330 (!) 215/142 - - (!) 101 19 (!) 83 %  02/21/18 1253 (!) 264/147 - - (!) 108 (!) 30 90 %  02/21/18 1247 - - - (!) 108 (!) 29 (!) 88 %  02/21/18 1246 (!) 200/162 - - - - (!) 88 %  02/21/18 1245 (!) 256/155 - - (!) 106 (!) 25 (!) 88 %  02/21/18 1244 (!) 264/156 97.9 F (36.6 C) Oral (!) 111 (!) 29 (!) 81 %  02/21/18 1242 (!) 264/156 - - (!) 106 (!) 23 (!) 82 %     Medical Decision Making: Because of urgency with congestive heart failure, secondary to noncompliance with medications.  Incidental use of marijuana present.  Doubt that that is causing any acute problems.  Patient will require admission for stabilization, and will need to be placed in the ICU for close monitoring and treatment.  Blood pressure improving on 2 vasoactive drips.  Diuresis good after single dose of Lasix.  Doubt ACS.   CRITICAL CARE-yes Performed by: Mancel Bale  Nursing Notes Reviewed/ Care Coordinated Applicable Imaging Reviewed Interpretation of Laboratory Data incorporated into ED treatment   3:22 PM-Consult complete with hospitalist. Patient case explained and discussed.  He agrees to admit patient for further evaluation and treatment. Call ended at 3:40 PM  Plan: Admit  Final Clinical Impressions(s) / ED Diagnoses   Final diagnoses:  Hypertensive urgency  Congestive heart failure, unspecified HF chronicity, unspecified heart failure type [facility])  Noncompliance with medications    ED Discharge Orders      None       Mancel Bale, MD 02/21/18 1936

## 2018-02-21 NOTE — ED Triage Notes (Signed)
PER EMS CALLED OUT FOR sob. On there arrival pt was in floor not feeling well. Pt is non compliant with hypertension and diabetes medication for over two years. Pt had episode of emesis prior to arrival. Pt reports that smoked weed approximately 3 hours ago. Pt  States onset of symptoms began sometime after smoking it.

## 2018-02-21 NOTE — ED Notes (Signed)
CRITICAL VALUE ALERT  Critical Value:  K 2.5, trop 0.03  Date & Time Notied:  02/21/18 1402  Provider Notified: Effie Shy  Orders Received/Actions taken: na

## 2018-02-21 NOTE — H&P (Addendum)
History and Physical    Duel Conrad [id number] DOB: [birth year] DOA: 02/21/2018  PCP: Patient, No Pcp Per  Patient coming from: home  I have personally briefly reviewed patient's old medical records in [facility]  Chief Complaint: Weakness and dizziness  HPI: Matthew Robles is a 62 y.o. male with medical history significant of hypertension, diabetes, who has not taken any of his medications in approximately a year.  Family says he thought he could manage his medical problems by nonpharmacologic means.  This morning, he woke up with general weakness and had persistent dizziness.  Denies any chest pain.  He has noted that he has had intermittent episodes of orthopnea over the past several months.  For the past 3 weeks, he is noticed worsening lower extremity edema.  Denies any vomiting, abdominal pain.  Says that bowel movements have been relatively normal.  No dysuria.  No fever or cough.  ED Course: He was noted to be severely hypertensive with initial systolic blood pressure of 264/147.  Heart rate was 108.  He was started on nitroglycerin and labetalol infusions.  Chest x-ray indicated decompensated CHF.  He had elevated BNP of 795.  Troponin 0 0.03.  EKG did not show any acute changes.  Creatinine 2.28 which is a chronic finding.  Potassium of 2.5.   Review of Systems: As per HPI otherwise 10 point review of systems negative.    Past Medical History:  Diagnosis Date  . Diabetes mellitus without complication (HCC)   . Hypertension   . Stroke [facility])     History reviewed. No pertinent surgical history.  Social History:  reports that he has been smoking cigarettes. He has been smoking about 1.00 pack per day. He has never used smokeless tobacco. He reports that he has current or past drug history. Drug: Marijuana. He reports that he does not drink alcohol.  No Known Allergies  Family history: Family history reviewed and not pertinent  Prior to Admission medications     Not on File    Physical Exam: Vitals:   02/21/18 1630 02/21/18 1640 02/21/18 1720 02/21/18 1800  BP: (!) 182/125 (!) 173/115  (!) 167/113  Pulse: 80 78  76  Resp: (!) 24 (!) 28  13  Temp:   (!) 97.5 F (36.4 C)   TempSrc:   Oral   SpO2: 96% 93%  98%  Weight:   83.4 kg   Height:   6' (1.829 m)     Constitutional: NAD, calm, comfortable Eyes: PERRL, lids and conjunctivae normal ENMT: Mucous membranes are moist. Posterior pharynx clear of any exudate or lesions.Normal dentition.  Neck: normal, supple, no masses, no thyromegaly Respiratory: Crackles at bases.  Normal respiratory effort. No accessory muscle use.  Cardiovascular: Regular rate and rhythm, no murmurs / rubs / gallops.  1+ extremity edema. 2+ pedal pulses. No carotid bruits.  Abdomen: no tenderness, no masses palpated. No hepatosplenomegaly. Bowel sounds positive.  Musculoskeletal: no clubbing / cyanosis. No joint deformity upper and lower extremities. Good ROM, no contractures. Normal muscle tone.  Skin: no rashes, lesions, ulcers. No induration Neurologic: CN 2-12 grossly intact. Sensation intact, DTR normal. Strength 5/5 in all 4.  Psychiatric: Normal judgment and insight. Alert and oriented x 3. Normal mood.    Labs on Admission: I have personally reviewed following labs and imaging studies  CBC: Recent Labs  Lab 02/21/18 1313  WBC 19.4*  NEUTROABS 16.2*  HGB 12.7*  HCT 37.8*  MCV 93.8  PLT 333  Basic Metabolic Panel: Recent Labs  Lab 02/21/18 1313 02/21/18 1556  NA 140  --   K 2.5*  --   CL 101  --   CO2 25  --   GLUCOSE 291*  --   BUN 29*  --   CREATININE 2.28*  --   CALCIUM 8.5*  --   MG  --  2.1   GFR: Estimated Creatinine Clearance: 36.4 mL/min (A) (by C-G formula based on SCr of 2.28 mg/dL (H)). Liver Function Tests: Recent Labs  Lab 02/21/18 1313  AST 30  ALT 26  ALKPHOS 81  BILITOT 1.0  PROT 8.5*  ALBUMIN 4.2   No results for input(s): LIPASE, AMYLASE in the last 168  hours. No results for input(s): AMMONIA in the last 168 hours. Coagulation Profile: No results for input(s): INR, PROTIME in the last 168 hours. Cardiac Enzymes: Recent Labs  Lab 02/21/18 1313  TROPONINI 0.03*   BNP (last 3 results) No results for input(s): PROBNP in the last 8760 hours. HbA1C: No results for input(s): HGBA1C in the last 72 hours. CBG: Recent Labs  Lab 02/21/18 1239 02/21/18 1718  GLUCAP 260* 318*   Lipid Profile: No results for input(s): CHOL, HDL, LDLCALC, TRIG, CHOLHDL, LDLDIRECT in the last 72 hours. Thyroid Function Tests: No results for input(s): TSH, T4TOTAL, FREET4, T3FREE, THYROIDAB in the last 72 hours. Anemia Panel: No results for input(s): VITAMINB12, FOLATE, FERRITIN, TIBC, IRON, RETICCTPCT in the last 72 hours. Urine analysis:    Component Value Date/Time   COLORURINE STRAW (A) 02/21/2018 1307   APPEARANCEUR CLEAR 02/21/2018 1307   LABSPEC 1.008 02/21/2018 1307   PHURINE 6.0 02/21/2018 1307   GLUCOSEU >=500 (A) 02/21/2018 1307   HGBUR NEGATIVE 02/21/2018 1307   BILIRUBINUR NEGATIVE 02/21/2018 1307   KETONESUR NEGATIVE 02/21/2018 1307   PROTEINUR 100 (A) 02/21/2018 1307   NITRITE NEGATIVE 02/21/2018 1307   LEUKOCYTESUR NEGATIVE 02/21/2018 1307    Radiological Exams on Admission: Dg Chest Port 1 View  Result Date: 02/21/2018 CLINICAL DATA:  Initial evaluation for acute shortness of breath. EXAM: PORTABLE CHEST 1 VIEW COMPARISON:  Prior radiograph from 01/15/2016. FINDINGS: Cardiomegaly, stable from previous.  Mediastinal silhouette normal. Lungs normally inflated. Diffuse vascular congestion with interstitial prominence, consistent with pulmonary interstitial edema. No pleural effusion. No consolidative airspace disease. No pneumothorax. Osseous structures within normal limits. IMPRESSION: Cardiomegaly with mild diffuse pulmonary interstitial edema. Electronically Signed   By: Rise Mu M.D.   On: 02/21/2018 13:26    EKG:  Independently reviewed.  Sinus tachycardia, LVH, no significant changes since prior EKG  Assessment/Plan Active Problems:   DM (diabetes mellitus), type 2 with neurological complications (HCC)   Hypokalemia   Hypertensive emergency   Acute CHF (congestive heart failure) (HCC)   Vomiting   CKD (chronic kidney disease) stage 3, GFR 30-59 ml/min (HCC)     1. Hypertensive emergency.  Patient noted to have markedly elevated blood pressure of 264/147.  He is associated decompensated CHF.  He is been started on labetalol and nitroglycerin infusion.  Will wean down as tolerated. 2. Acute CHF.  Likely related to hypertensive disease.  Echocardiogram has been ordered.  Will start on IV Lasix twice daily.  Monitor intake and output.  Will request cardiology input. 3. Chronic kidney disease stage III.  Creatinine is currently at baseline.  Continue to monitor. 4. Diabetes.  Uncontrolled with hyperglycemia.  Start the patient on sliding scale insulin.  Check A1c. 5. Hypokalemia.  Replace.  Magnesium normal. 6.  Vomiting.  He reports normal bowel movements.  Abdomen feels benign, no distention.  Will give a dose of Reglan.  He may have an element of gastroparesis with his uncontrolled diabetes.  Treat supportively at this time.  If vomiting persists, can consider imaging.  DVT prophylaxis: Lovenox Code Status: Full code Family Communication: Discussed with daughter at the bedside Disposition Plan: Discharge home once improved Consults called: Cardiology Admission status: Inpatient, ICU  Critical care time spent: 50 minutes.  Patient remains critically ill with markedly elevated blood pressure requiring 2 vasoactive infusions.  He needs close monitoring in the ICU of his blood pressure and is at high risk for decompensation.  Erick Blinks MD [facility] Pager [phone]  If 7PM-7AM, please contact night-coverage [email or web address] Password [email or web address]  02/21/2018, 7:03 PM

## 2018-02-21 NOTE — ED Notes (Signed)
EDP at the bedside, Pressure stating to come down, advise staffing, I need to stay with pt one on one to triate continually until goal is met. EDP will call of ICU bed

## 2018-02-21 NOTE — ED Notes (Signed)
Pt continues to be diaphoretic, denies, pain, using a urinal, feels SOB

## 2018-02-22 ENCOUNTER — Inpatient Hospital Stay (HOSPITAL_COMMUNITY): Payer: Self-pay

## 2018-02-22 DIAGNOSIS — I161 Hypertensive emergency: Principal | ICD-10-CM

## 2018-02-22 DIAGNOSIS — I5030 Unspecified diastolic (congestive) heart failure: Secondary | ICD-10-CM

## 2018-02-22 DIAGNOSIS — I5031 Acute diastolic (congestive) heart failure: Secondary | ICD-10-CM

## 2018-02-22 LAB — GLUCOSE, CAPILLARY
Glucose-Capillary: 218 mg/dL — ABNORMAL HIGH (ref 70–99)
Glucose-Capillary: 214 mg/dL — ABNORMAL HIGH (ref 70–99)
Glucose-Capillary: 165 mg/dL — ABNORMAL HIGH (ref 70–99)
Glucose-Capillary: 178 mg/dL — ABNORMAL HIGH (ref 70–99)

## 2018-02-22 LAB — TROPONIN I
TROPONIN I: 0.07 ng/mL — AB (ref <0.03)
Troponin I: 0.06 ng/mL (ref <0.03)
Troponin I: 0.07 ng/mL (ref <0.03)

## 2018-02-22 LAB — BASIC METABOLIC PANEL
Anion gap: 13 (ref 5–15)
BUN: 38 mg/dL — ABNORMAL HIGH (ref 8–23)
CALCIUM: 7.9 mg/dL — AB (ref 8.9–10.3)
CO2: 27 mmol/L (ref 22–32)
CREATININE: 2.89 mg/dL — AB (ref 0.61–1.24)
Chloride: 98 mmol/L (ref 98–111)
GFR calc Af Amer: 25 mL/min — ABNORMAL LOW (ref >60)
GFR, EST NON AFRICAN AMERICAN: 22 mL/min — AB (ref >60)
GLUCOSE: 286 mg/dL — AB (ref 70–99)
Potassium: 3.3 mmol/L — ABNORMAL LOW (ref 3.5–5.1)
Sodium: 138 mmol/L (ref 135–145)

## 2018-02-22 LAB — ECHOCARDIOGRAM COMPLETE
Height: 72 in
WEIGHTICAEL: 2941.82 [oz_av]

## 2018-02-22 LAB — MRSA PCR SCREENING: MRSA BY PCR: NEGATIVE

## 2018-02-22 LAB — HEMOGLOBIN A1C
HEMOGLOBIN A1C: 7.2 % — AB (ref 4.8–5.6)
Mean Plasma Glucose: 160 mg/dL

## 2018-02-22 LAB — HIV ANTIBODY (ROUTINE TESTING W REFLEX): HIV SCREEN 4TH GENERATION: NONREACTIVE

## 2018-02-22 MED ORDER — ENOXAPARIN SODIUM 30 MG/0.3ML ~~LOC~~ SOLN
30.0000 mg | SUBCUTANEOUS | Status: DC
  Administered 2018-02-22: 30 mg via SUBCUTANEOUS
  Filled 2018-02-22: qty 0.3

## 2018-02-22 MED ORDER — ORAL CARE MOUTH RINSE
15.0000 mL | Freq: Two times a day (BID) | OROMUCOSAL | Status: DC
  Administered 2018-02-23 – 2018-02-25 (×5): 15 mL via OROMUCOSAL

## 2018-02-22 MED ORDER — SALINE SPRAY 0.65 % NA SOLN
1.0000 | NASAL | Status: DC | PRN
  Filled 2018-02-22: qty 44

## 2018-02-22 MED ORDER — LABETALOL HCL 5 MG/ML IV SOLN
INTRAVENOUS | Status: AC
  Filled 2018-02-22: qty 4

## 2018-02-22 MED ORDER — LIVING BETTER WITH HEART FAILURE BOOK: Freq: Once | Status: DC

## 2018-02-22 MED ORDER — GUAIFENESIN-DM 100-10 MG/5ML PO SYRP: 5.0000 mL | ORAL_SOLUTION | ORAL | Status: DC | PRN

## 2018-02-22 MED ORDER — AMLODIPINE BESYLATE 5 MG PO TABS: 10.0000 mg | ORAL_TABLET | Freq: Every day | ORAL | Status: DC

## 2018-02-22 MED ORDER — LIP MEDEX EX OINT
1.0000 "application " | TOPICAL_OINTMENT | CUTANEOUS | Status: DC | PRN
  Filled 2018-02-22: qty 7

## 2018-02-22 MED ORDER — LIVING WELL WITH DIABETES BOOK
Freq: Once | Status: AC
  Administered 2018-02-22: 21:00:00
  Filled 2018-02-22: qty 1

## 2018-02-22 MED ORDER — POLYVINYL ALCOHOL 1.4 % OP SOLN: 1.0000 [drp] | OPHTHALMIC | Status: DC | PRN

## 2018-02-22 MED ORDER — AMLODIPINE BESYLATE 5 MG PO TABS
10.0000 mg | ORAL_TABLET | Freq: Every day | ORAL | Status: DC
  Administered 2018-02-22 – 2018-02-25 (×4): 10 mg via ORAL
  Filled 2018-02-22 (×4): qty 2

## 2018-02-22 NOTE — Progress Notes (Addendum)
Inpatient Diabetes Program Recommendations  AACE/ADA: New Consensus Statement on Inpatient Glycemic Control (2015)  Target Ranges:  Prepandial:   less than 140 mg/dL      Peak postprandial:   less than 180 mg/dL (1-2 hours)      Critically ill patients:  140 - 180 mg/dL   Results for BOSTEN, NEWSTROM (MRN [id number]) as of 02/22/2018 11:53  Ref. Range 02/21/2018 12:39 02/21/2018 17:18 02/21/2018 21:00 02/22/2018 08:26 02/22/2018 11:29  Glucose-Capillary Latest Ref Range: 70 - 99 mg/dL 409 (H) 811 (H) 914 (H) 218 (H) 214 (H)   Review of Glycemic Control  Diabetes history: DM 2 Outpatient Diabetes medications: None Current orders for Inpatient glycemic control: Novolog 0-20 units tid, Novolog 0-5 units qhs  A1c 7.2% on 10/1 this admission  Inpatient Diabetes Program Recommendations:    Glucose trends in the 200's with Resistant Correction scale consider low dose basal insulin while inpatient, Lantus 12-14 units Q24 hours while here, may also consider reducing correction scale to Novolog Moderate Correction 0-15 units tid if long acting insulin started, due to renal function.  Thanks,  Christena Deem RN, MSN, BC-ADM Inpatient Diabetes Coordinator Team Pager [phone] (8a-5p)

## 2018-02-22 NOTE — Progress Notes (Signed)
PROGRESS NOTE    Matthew Robles  WJX:[id number] DOB: 15-Dec-1955 DOA: 02/21/2018 PCP: Patient, No Pcp Per    Brief Narrative:  62 year old male admitted to the hospital with weakness and dizziness.  Found to have hypertensive emergency with blood pressure of 264/147.  Started on labetalol and nitroglycerin infusions.  Also had decompensated CHF.  Started on IV Lasix.  Cardiology following.   Assessment & Plan:   Active Problems:   DM (diabetes mellitus), type 2 with neurological complications (HCC)   Hypokalemia   Hypertensive emergency   Acute CHF (congestive heart failure) (HCC)   Vomiting   CKD (chronic kidney disease) stage 3, GFR 30-59 ml/min (HCC)   1. Hypertensive emergency.  Initially started on both nitroglycerin and labetalol infusions.  Labetalol has been weaned off.  He is been started on Norvasc.  He is continued on nitroglycerin infusion which is being weaned off. 2. Acute diastolic congestive heart failure.  Echo shows normal EF with grade 2 diastolic dysfunction.  He did present with shortness of breath.  He was started on IV Lasix and his shortness of breath is better.  Lasix has been discontinued due to bump in creatinine.  Cardiology following. 3. AKI on CKD stage III.  Likely related to Lasix.  Continue to monitor renal function.  Lasix now on hold. 4. Hypokalemia.  Replaced 5. Diabetes.  A1c is 7.2.  He is currently on sliding scale insulin.  Will likely need to be transitioned to oral therapy prior to discharge. 6. Vomiting.  Unclear etiology.  Possibly related to gastroparesis.  Started on Reglan with improvement.  He is tolerating clear liquids at this time.  Will advance to solid diet.  Abdomen otherwise feels benign.  LFTs unremarkable.   DVT prophylaxis: Lovenox Code Status: Full code Family Communication: No family present Disposition Plan: Discharge home once improved   Consultants:   Cardiology  Procedures:  Echo: - Left ventricle: The cavity  size was normal. Wall thickness was   increased in a pattern of moderate LVH. Systolic function was   normal. The estimated ejection fraction was in the range of 50%   to 55%. Wall motion was normal; there were no regional wall   motion abnormalities. Features are consistent with a pseudonormal   left ventricular filling pattern, with concomitant abnormal   relaxation and increased filling pressure (grade 2 diastolic   dysfunction). Doppler parameters are consistent with high   ventricular filling pressure. - Aortic valve: Mildly calcified annulus. Trileaflet; mildly   thickened leaflets. Valve area (VTI): 2.17 cm^2. Valve area   (Vmax): 2.28 cm^2. - Left atrium: The atrium was severely dilated. - Right ventricle: The cavity size was mildly dilated. Systolic   function was mildly reduced. - Right atrium: The atrium was severely dilated.  - Technically adequate study.  Antimicrobials:       Subjective: No abdominal pain.  He did have some vomiting overnight.  Overall he feels breathing is better today.  Objective: Vitals:   02/22/18 1555 02/22/18 1730 02/22/18 1800 02/22/18 1830  BP:  (!) 178/106 (!) 182/98 (!) 166/91  Pulse: 82 85 83 84  Resp: 15 14 18 15   Temp: 98.3 F (36.8 C)     TempSrc: Oral     SpO2: 97% 100% 98% 98%  Weight:      Height:        Intake/Output Summary (Last 24 hours) at 02/22/2018 1849 Last data filed at 02/22/2018 1558 Gross per 24 hour  Intake 812.14 ml  Output 1600 ml  Net -787.86 ml   Filed Weights   02/21/18 1720 02/22/18 0500  Weight: 83.4 kg 83.4 kg    Examination:  General exam: Appears calm and comfortable  Respiratory system: Clear to auscultation. Respiratory effort normal. Cardiovascular system: S1 & S2 heard, RRR. No JVD, murmurs, rubs, gallops or clicks. 1+ pedal edema. Gastrointestinal system: Abdomen is nondistended, soft and nontender. No organomegaly or masses felt. Normal bowel sounds heard. Central nervous system:  Alert and oriented. No focal neurological deficits. Extremities: Symmetric 5 x 5 power. Skin: No rashes, lesions or ulcers Psychiatry: Judgement and insight appear normal. Mood & affect appropriate.     Data Reviewed: I have personally reviewed following labs and imaging studies  CBC: Recent Labs  Lab 02/21/18 1313  WBC 19.4*  NEUTROABS 16.2*  HGB 12.7*  HCT 37.8*  MCV 93.8  PLT 333   Basic Metabolic Panel: Recent Labs  Lab 02/21/18 1313 02/21/18 1556 02/22/18 0650  NA 140  --  138  K 2.5*  --  3.3*  CL 101  --  98  CO2 25  --  27  GLUCOSE 291*  --  286*  BUN 29*  --  38*  CREATININE 2.28*  --  2.89*  CALCIUM 8.5*  --  7.9*  MG  --  2.1  --    GFR: Estimated Creatinine Clearance: 28.7 mL/min (A) (by C-G formula based on SCr of 2.89 mg/dL (H)). Liver Function Tests: Recent Labs  Lab 02/21/18 1313  AST 30  ALT 26  ALKPHOS 81  BILITOT 1.0  PROT 8.5*  ALBUMIN 4.2   No results for input(s): LIPASE, AMYLASE in the last 168 hours. No results for input(s): AMMONIA in the last 168 hours. Coagulation Profile: No results for input(s): INR, PROTIME in the last 168 hours. Cardiac Enzymes: Recent Labs  Lab 02/21/18 1313 02/22/18 0051 02/22/18 0650 02/22/18 1305  TROPONINI 0.03* 0.06* 0.07* 0.07*   BNP (last 3 results) No results for input(s): PROBNP in the last 8760 hours. HbA1C: Recent Labs    02/21/18 1305  HGBA1C 7.2*   CBG: Recent Labs  Lab 02/21/18 1718 02/21/18 2100 02/22/18 0826 02/22/18 1129 02/22/18 1556  GLUCAP 318* 315* 218* 214* 165*   Lipid Profile: No results for input(s): CHOL, HDL, LDLCALC, TRIG, CHOLHDL, LDLDIRECT in the last 72 hours. Thyroid Function Tests: No results for input(s): TSH, T4TOTAL, FREET4, T3FREE, THYROIDAB in the last 72 hours. Anemia Panel: No results for input(s): VITAMINB12, FOLATE, FERRITIN, TIBC, IRON, RETICCTPCT in the last 72 hours. Sepsis Labs: No results for input(s): PROCALCITON, LATICACIDVEN in the  last 168 hours.  Recent Results (from the past 240 hour(s))  MRSA PCR Screening     Status: None   Collection Time: 02/21/18  5:17 PM  Result Value Ref Range Status   MRSA by PCR NEGATIVE NEGATIVE Final    Comment:        The GeneXpert MRSA Assay (FDA approved for NASAL specimens only), is one component of a comprehensive MRSA colonization surveillance program. It is not intended to diagnose MRSA infection nor to guide or monitor treatment for MRSA infections. Performed at [facility], [address] [place], [place] [address]          Radiology Studies: Dg Chest [facility] 1 View  Result Date: 02/21/2018 CLINICAL DATA:  Initial evaluation for acute shortness of breath. EXAM: PORTABLE CHEST 1 VIEW COMPARISON:  Prior radiograph from 01/15/2016. FINDINGS: Cardiomegaly, stable from previous.  Mediastinal silhouette normal.  Lungs normally inflated. Diffuse vascular congestion with interstitial prominence, consistent with pulmonary interstitial edema. No pleural effusion. No consolidative airspace disease. No pneumothorax. Osseous structures within normal limits. IMPRESSION: Cardiomegaly with mild diffuse pulmonary interstitial edema. Electronically Signed   By: Rise Mu M.D.   On: 02/21/2018 13:26        Scheduled Meds: . amLODipine  10 mg Oral Daily  . aspirin EC  81 mg Oral Daily  . enoxaparin (LOVENOX) injection  30 mg Subcutaneous Q24H  . insulin aspart  0-20 Units Subcutaneous TID WC  . insulin aspart  0-5 Units Subcutaneous QHS  . mouth rinse  15 mL Mouth Rinse BID  . metoCLOPramide (REGLAN) injection  5 mg Intravenous Q6H  . sodium chloride flush  3 mL Intravenous Q12H   Continuous Infusions: . sodium chloride    . nitroGLYCERIN 25 mcg/min (02/22/18 1500)     LOS: 1 day    Time spent:    Erick Blinks, MD [facility] Pager [phone]  If 7PM-7AM, please contact night-coverage [email or web address] Password [facility] 02/22/2018, 6:49 PM

## 2018-02-22 NOTE — Progress Notes (Signed)
*  PRELIMINARY RESULTS* Echocardiogram 2D Echocardiogram has been performed.  Jeryl Columbia 02/22/2018, 1:23 PM

## 2018-02-22 NOTE — Consult Note (Signed)
Cardiology Consultation:   Patient ID: Matthew Robles MRN: [id number]; DOB: [birth year]  Admit date: 02/21/2018 Date of Consult: 02/22/2018  Primary Care Provider: Patient, No Pcp Per Primary Cardiologist: none Primary Electrophysiologist:  none   Patient Profile:   Matthew Robles is a 62 y.o. male with a hx of HTN who is being seen today for the evaluation of severe hypertension at the request of Dr Kerry Hough.  History of Present Illness:   Matthew Robles 62 yo male history of DM2, HTN, prior CVA presented with weakness and dizziness. He stopped all his home bp's about 1 year ago on his own. SBPs on presentation were in the 260s. He also reports episodes of orthopnea at home, LE edema. No chest pain  In ER started on IV labetalol and IV nitroglycerin drips    BNP 795 K 2.5 Cr 2.28 BUN 29 WBC 19.4 Hgb 12.7 Plt 333 Mg 2.1 Trop 0.03-->0.06-->0.07--> CXR cardiomegaly, pulm edema EKG SR, LAE, LVH. Lateral TWIs likely due to LVH strain Echo pending.   Past Medical History:  Diagnosis Date  . Diabetes mellitus without complication (HCC)   . Hypertension   . Stroke [id number])     History reviewed. No pertinent surgical history.     Inpatient Medications: Scheduled Meds: . aspirin EC  81 mg Oral Daily  . enoxaparin (LOVENOX) injection  40 mg Subcutaneous Q24H  . furosemide  60 mg Intravenous BID  . insulin aspart  0-20 Units Subcutaneous TID WC  . insulin aspart  0-5 Units Subcutaneous QHS  . mouth rinse  15 mL Mouth Rinse BID  . metoCLOPramide (REGLAN) injection  5 mg Intravenous Q6H  . sodium chloride flush  3 mL Intravenous Q12H   Continuous Infusions: . sodium chloride    . labetalol (NORMODYNE) infusion Stopped (02/22/18 0404)  . nitroGLYCERIN 35 mcg/min (02/22/18 0800)   PRN Meds: sodium chloride, acetaminophen, albuterol, ondansetron (ZOFRAN) IV, sodium chloride flush  Allergies:   No Known Allergies  Social History:   Social History   Socioeconomic History  .  Marital status: Single    Spouse name: Not on file  . Number of children: Not on file  . Years of education: Not on file  . Highest education level: Not on file  Occupational History  . Not on file  Social Needs  . Financial resource strain: Not on file  . Food insecurity:    Worry: Not on file    Inability: Not on file  . Transportation needs:    Medical: Not on file    Non-medical: Not on file  Tobacco Use  . Smoking status: Current Every Day Smoker    Packs/day: 1.00    Types: Cigarettes  . Smokeless tobacco: Never Used  Substance and Sexual Activity  . Alcohol use: No    Comment: a couple of times a week  . Drug use: Yes    Types: Marijuana  . Sexual activity: Not on file  Lifestyle  . Physical activity:    Days per week: Not on file    Minutes per session: Not on file  . Stress: Not on file  Relationships  . Social connections:    Talks on phone: Not on file    Gets together: Not on file    Attends religious service: Not on file    Active member of club or organization: Not on file    Attends meetings of clubs or organizations: Not on file    Relationship status: Not on file  .  Intimate partner violence:    Fear of current or ex partner: Not on file    Emotionally abused: Not on file    Physically abused: Not on file    Forced sexual activity: Not on file  Other Topics Concern  . Not on file  Social History Narrative  . Not on file    Family History:   History reviewed. No pertinent family history.   ROS:  Please see the history of present illness.   All other ROS reviewed and negative.     Physical Exam/Data:   Vitals:   02/22/18 0600 02/22/18 0700 02/22/18 0800 02/22/18 0827  BP: (!) 146/87 (!) 157/88 (!) 147/88   Pulse: 80 79 82 83  Resp: (!) 24 12 13 17   Temp:    98.3 F (36.8 C)  TempSrc:    Oral  SpO2: 100% 96% 100% 100%  Weight:      Height:        Intake/Output Summary (Last 24 hours) at 02/22/2018 0839 Last data filed at 02/22/2018  0800 Gross per 24 hour  Intake 2255.44 ml  Output 3025 ml  Net -769.56 ml   Filed Weights   02/21/18 1720 02/22/18 0500  Weight: 83.4 kg 83.4 kg   Body mass index is 24.94 kg/m.  General:  Well nourished, well developed, in no acute distress HEENT: normal Lymph: no adenopathy Neck: no JVD Endocrine:  No thryomegaly Cardiac:  normal S1, S2; RRR; no murmur  Lungs:  clear to auscultation bilaterally, no wheezing, rhonchi or rales  Abd: soft, nontender, no hepatomegaly  Ext: no edema Musculoskeletal:  No deformities, BUE and BLE strength normal and equal Skin: warm and dry  Neuro:  CNs 2-12 intact, no focal abnormalities noted Psych:  Normal affect    Relevant CV Studies:   Laboratory Data:  Chemistry Recent Labs  Lab 02/21/18 1313 02/22/18 0650  NA 140 138  K 2.5* 3.3*  CL 101 98  CO2 25 27  GLUCOSE 291* 286*  BUN 29* 38*  CREATININE 2.28* 2.89*  CALCIUM 8.5* 7.9*  GFRNONAA 29* 22*  GFRAA 33* 25*  ANIONGAP 14 13    Recent Labs  Lab 02/21/18 1313  PROT 8.5*  ALBUMIN 4.2  AST 30  ALT 26  ALKPHOS 81  BILITOT 1.0   Hematology Recent Labs  Lab 02/21/18 1313  WBC 19.4*  RBC 4.03*  HGB 12.7*  HCT 37.8*  MCV 93.8  MCH 31.5  MCHC 33.6  RDW 14.2  PLT 333   Cardiac Enzymes Recent Labs  Lab 02/21/18 1313 02/22/18 0051 02/22/18 0650  TROPONINI 0.03* 0.06* 0.07*   No results for input(s): TROPIPOC in the last 168 hours.  BNP Recent Labs  Lab 02/21/18 1307  BNP 795.0*    DDimer No results for input(s): DDIMER in the last 168 hours.  Radiology/Studies:  Dg Chest Port 1 View  Result Date: 02/21/2018 CLINICAL DATA:  Initial evaluation for acute shortness of breath. EXAM: PORTABLE CHEST 1 VIEW COMPARISON:  Prior radiograph from 01/15/2016. FINDINGS: Cardiomegaly, stable from previous.  Mediastinal silhouette normal. Lungs normally inflated. Diffuse vascular congestion with interstitial prominence, consistent with pulmonary interstitial edema. No  pleural effusion. No consolidative airspace disease. No pneumothorax. Osseous structures within normal limits. IMPRESSION: Cardiomegaly with mild diffuse pulmonary interstitial edema. Electronically Signed   By: Rise Mu M.D.   On: 02/21/2018 13:26    Assessment and Plan:   1. Hypertensive emergency - he stopped all his home meds on his  own - admitted with SBP in 260s, CXR with pulmonary edema - started on IV labtetalol and IV NG drips. Labetlol down to 1mg /min early this AM and now stopped. ING NG down to 73mcg/min.  - with AKI would d/c IV lasix. Continue to wean IV drips. Start norvasc 10mg  daily (having some N/V, will see if can keep down). F/u echo, if systolic dysfunction will affect selection of his HTN regimen. Would avoid ACEI/ARB given his poor renal function. - bp down to 147/88 this AM.    2. Elevated troponin - mild elevation in setting of severe HTN and CKD, likely demand ischemia.  - f/u echo, at this time no plans for inpatient ischemic testing.   3. 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 Admitted Cr 2.28 near his baseline, however up to 2.89 this AM.   4. History of CVA - continue ASA, would benefit from statin.   5. Nausea/vomiting - per primary team     For questions or updates, please contact [facility] Please consult [email or web address] for contact info under     Signed, Dina Rich, MD  02/22/2018 8:39 AM

## 2018-02-23 DIAGNOSIS — Z91148 Patient's other noncompliance with medication regimen for other reason: Secondary | ICD-10-CM

## 2018-02-23 DIAGNOSIS — Z9114 Patient's other noncompliance with medication regimen: Secondary | ICD-10-CM

## 2018-02-23 LAB — MAGNESIUM: Magnesium: 1.5 mg/dL — ABNORMAL LOW (ref 1.7–2.4)

## 2018-02-23 LAB — BASIC METABOLIC PANEL
ANION GAP: 14 (ref 5–15)
BUN: 31 mg/dL — ABNORMAL HIGH (ref 8–23)
CALCIUM: 8.3 mg/dL — AB (ref 8.9–10.3)
CO2: 25 mmol/L (ref 22–32)
CREATININE: 2.47 mg/dL — AB (ref 0.61–1.24)
Chloride: 100 mmol/L (ref 98–111)
GFR, EST AFRICAN AMERICAN: 30 mL/min — AB (ref >60)
GFR, EST NON AFRICAN AMERICAN: 26 mL/min — AB (ref >60)
Glucose, Bld: 203 mg/dL — ABNORMAL HIGH (ref 70–99)
Potassium: 2.7 mmol/L — CL (ref 3.5–5.1)
SODIUM: 139 mmol/L (ref 135–145)

## 2018-02-23 LAB — GLUCOSE, CAPILLARY
GLUCOSE-CAPILLARY: 207 mg/dL — AB (ref 70–99)
GLUCOSE-CAPILLARY: 159 mg/dL — AB (ref 70–99)
Glucose-Capillary: 171 mg/dL — ABNORMAL HIGH (ref 70–99)
Glucose-Capillary: 213 mg/dL — ABNORMAL HIGH (ref 70–99)

## 2018-02-23 LAB — LIPID PANEL
CHOLESTEROL: 154 mg/dL (ref 0–200)
HDL: 31 mg/dL — AB (ref >40)
LDL Cholesterol: 83 mg/dL (ref 0–99)
TRIGLYCERIDES: 198 mg/dL — AB (ref <150)
Total CHOL/HDL Ratio: 5 RATIO
VLDL: 40 mg/dL (ref 0–40)

## 2018-02-23 MED ORDER — BLISTEX MEDICATED EX OINT
TOPICAL_OINTMENT | CUTANEOUS | Status: DC | PRN
  Administered 2018-02-23: 1 via TOPICAL
  Filled 2018-02-23: qty 6.3

## 2018-02-23 MED ORDER — CLONIDINE HCL 0.1 MG PO TABS
0.1000 mg | ORAL_TABLET | Freq: Two times a day (BID) | ORAL | Status: DC
  Administered 2018-02-23 – 2018-02-24 (×3): 0.1 mg via ORAL
  Filled 2018-02-23 (×3): qty 1

## 2018-02-23 MED ORDER — ATORVASTATIN CALCIUM 40 MG PO TABS
40.0000 mg | ORAL_TABLET | Freq: Every day | ORAL | Status: DC
  Administered 2018-02-23 – 2018-02-24 (×2): 40 mg via ORAL
  Filled 2018-02-23 (×2): qty 1

## 2018-02-23 MED ORDER — ENOXAPARIN SODIUM 40 MG/0.4ML ~~LOC~~ SOLN
40.0000 mg | SUBCUTANEOUS | Status: DC
  Administered 2018-02-23 – 2018-02-24 (×2): 40 mg via SUBCUTANEOUS
  Filled 2018-02-23 (×2): qty 0.4

## 2018-02-23 MED ORDER — METOCLOPRAMIDE HCL 10 MG PO TABS: 5.0000 mg | ORAL_TABLET | Freq: Three times a day (TID) | ORAL | Status: DC | PRN

## 2018-02-23 MED ORDER — HYDRALAZINE HCL 25 MG PO TABS
50.0000 mg | ORAL_TABLET | Freq: Three times a day (TID) | ORAL | Status: DC
  Administered 2018-02-23 – 2018-02-24 (×2): 50 mg via ORAL
  Filled 2018-02-23 (×2): qty 2

## 2018-02-23 MED ORDER — LABETALOL HCL 200 MG PO TABS
400.0000 mg | ORAL_TABLET | Freq: Two times a day (BID) | ORAL | Status: DC
  Administered 2018-02-23 – 2018-02-25 (×5): 400 mg via ORAL
  Filled 2018-02-23 (×5): qty 2

## 2018-02-23 MED ORDER — HYDRALAZINE HCL 20 MG/ML IJ SOLN
INTRAMUSCULAR | Status: AC
  Filled 2018-02-23: qty 1

## 2018-02-23 MED ORDER — POTASSIUM CHLORIDE CRYS ER 20 MEQ PO TBCR
40.0000 meq | EXTENDED_RELEASE_TABLET | Freq: Once | ORAL | Status: AC
  Administered 2018-02-23: 40 meq via ORAL
  Filled 2018-02-23: qty 2

## 2018-02-23 MED ORDER — HYDRALAZINE HCL 20 MG/ML IJ SOLN
20.0000 mg | Freq: Once | INTRAMUSCULAR | Status: AC
  Administered 2018-02-23: 20 mg via INTRAVENOUS
  Filled 2018-02-23: qty 1

## 2018-02-23 MED ORDER — MAGNESIUM SULFATE 2 GM/50ML IV SOLN: 2.0000 g | Freq: Once | INTRAVENOUS | Status: DC

## 2018-02-23 MED ORDER — ZOLPIDEM TARTRATE 5 MG PO TABS
5.0000 mg | ORAL_TABLET | Freq: Every evening | ORAL | Status: DC | PRN
  Administered 2018-02-23: 5 mg via ORAL
  Filled 2018-02-23: qty 1

## 2018-02-23 MED ORDER — MAGNESIUM SULFATE 2 GM/50ML IV SOLN
2.0000 g | Freq: Once | INTRAVENOUS | Status: AC
  Administered 2018-02-23: 2 g via INTRAVENOUS
  Filled 2018-02-23: qty 50

## 2018-02-23 MED ORDER — CLONIDINE HCL 0.2 MG PO TABS
0.2000 mg | ORAL_TABLET | Freq: Once | ORAL | Status: AC
  Administered 2018-02-23: 0.2 mg via ORAL
  Filled 2018-02-23: qty 1

## 2018-02-23 NOTE — Progress Notes (Signed)
Md notified of critical potassium of 2.7. Also informed of patients continued elevated bp despite nitro at rate of 130 and earlier dose of hydralazine. New orders received and noted

## 2018-02-23 NOTE — Progress Notes (Signed)
Inpatient Diabetes Program Recommendations  AACE/ADA: New Consensus Statement on Inpatient Glycemic Control (2015)  Target Ranges:  Prepandial:   less than 140 mg/dL      Peak postprandial:   less than 180 mg/dL (1-2 hours)      Critically ill patients:  140 - 180 mg/dL    Spoke with patient over the phone. Patient has been on metformin in the past. Patient has always had a controlled A1c. When he was initially diagnosed patient reported his PCP at that time was impressed by his A1c and assumed he was taking his medication but he was not.   Patient's A1c is almost at goal 7.2% this admission. Patient reports sometimes having prickling sensation in extremities.   Patient does not check glucose at home. Patient has not followed up with a PCP in awhile. Patient will need follow up assistance. Placed CM consult. Spoke with patient about the health department. Discussed importance of regular check ups and glucose control to prevent further renal effects and neuropathy.  Thanks,  Christena Deem RN, MSN, BC-ADM Inpatient Diabetes Coordinator Team Pager [phone] (8a-5p)

## 2018-02-23 NOTE — Progress Notes (Signed)
Dr. Laural Benes made aware of patient's blood pressures and being unable to titrate the patient off the nitro drip. Patient asymptomatic with no complaints of pain, chest pain, dizziness, nausea or vomiting. Patient tolerated PO meds and diet well.

## 2018-02-23 NOTE — Progress Notes (Addendum)
PROGRESS NOTE    Matthew Robles  ZOX:[id number] DOB: 1955/08/03 DOA: 02/21/2018 PCP: Patient, No Pcp Per    Brief Narrative:  62 year old male admitted to the hospital with weakness and dizziness.  Found to have hypertensive emergency with blood pressure of 264/147.  Started on labetalol and nitroglycerin infusions.  Also had decompensated CHF.  Started on IV Lasix.  Cardiology following.   Assessment & Plan:   Active Problems:   DM (diabetes mellitus), type 2 with neurological complications (HCC)   Hypokalemia   Hypertensive emergency   Acute CHF (congestive heart failure) (HCC)   Vomiting   CKD (chronic kidney disease) stage 3, GFR 30-59 ml/min (HCC)   1. Hypertensive emergency.  BP slowly improving.  Pt remains on IV nitroglycerin infusion.  Labetalol has been weaned off.  He is been started on Norvasc.  He was given a dose of oral clonidine this morning.  He has been started on oral labetalol by cardiology service.    2. Acute diastolic congestive heart failure secondary to hypertensive emergency.  Echo shows preserved EF with grade 2 diastolic dysfunction.  He did present with shortness of breath.  He was started on IV Lasix and his shortness of breath is better.  Lasix has been discontinued due to bump in creatinine.  Cardiology following. 3. Hypokalemia - oral replacement ordered, check magnesium.   4. AKI on CKD stage III.  Likely related to Lasix.  Continue to monitor renal function.  Lasix now on hold. 5. Hypokalemia.  Replaced 6. Diabetes type 2 with renal complications -  A1c is 7.2.  He is currently on sliding scale insulin. 7. Nausea/Vomiting.  Improved.  Unclear etiology.   Continue solid diet as tolerated.  Abdomen otherwise feels benign.  LFTs unremarkable.  DC IV metoclopramide.     DVT prophylaxis: Lovenox Code Status: Full code Family Communication: No family present Disposition Plan: Discharge home once improved  Consultants:   Cardiology  Procedures:    Echo: - Left ventricle: The cavity size was normal. Wall thickness was   increased in a pattern of moderate LVH. Systolic function was   normal. The estimated ejection fraction was in the range of 50%   to 55%. Wall motion was normal; there were no regional wall   motion abnormalities. Features are consistent with a pseudonormal   left ventricular filling pattern, with concomitant abnormal   relaxation and increased filling pressure (grade 2 diastolic   dysfunction). Doppler parameters are consistent with high   ventricular filling pressure. - Aortic valve: Mildly calcified annulus. Trileaflet; mildly   thickened leaflets. Valve area (VTI): 2.17 cm^2. Valve area   (Vmax): 2.28 cm^2. - Left atrium: The atrium was severely dilated. - Right ventricle: The cavity size was mildly dilated. Systolic   function was mildly reduced. - Right atrium: The atrium was severely dilated.  - Technically adequate study.  REVIEW OF SYSTEMS As noted otherwise all reviewed and reported negative.   Subjective: Pt says that he is breathing better.  No chest pain.    Objective: Vitals:   02/23/18 0515 02/23/18 0530 02/23/18 0545 02/23/18 0600  BP: (!) 197/111 (!) 195/102 (!) 189/123 (!) 195/97  Pulse: 93 90 94 89  Resp: 17 17 (!) 22 12  Temp:      TempSrc:      SpO2: 99% 99% 99% 97%  Weight:      Height:        Intake/Output Summary (Last 24 hours) at 02/23/2018 0606 Last data filed  at 02/23/2018 0604 Gross per 24 hour  Intake 364.06 ml  Output 1600 ml  Net -1235.94 ml   Filed Weights   02/21/18 1720 02/22/18 0500  Weight: 83.4 kg 83.4 kg    Examination:  General exam: Awake, alert, NAD.  Appears calm and comfortable.   Respiratory system: Clear to auscultation. Respiratory effort normal. Cardiovascular system: normal S1 & S2 heard. No JVD, murmurs, rubs, gallops or clicks. 1+ pedal edema. Gastrointestinal system: Abdomen is nondistended, soft and nontender. No organomegaly or masses  felt. Normal bowel sounds heard. Central nervous system: Alert and oriented. No focal neurological deficits. Extremities: Symmetric 5 x 5 power. Skin: No rashes, lesions or ulcers Psychiatry: Judgement and insight appear normal. Mood & affect appropriate.   Data Reviewed: I have personally reviewed following labs and imaging studies  CBC: Recent Labs  Lab 02/21/18 1313  WBC 19.4*  NEUTROABS 16.2*  HGB 12.7*  HCT 37.8*  MCV 93.8  PLT 333   Basic Metabolic Panel: Recent Labs  Lab 02/21/18 1313 02/21/18 1556 02/22/18 0650 02/23/18 0435  NA 140  --  138 139  K 2.5*  --  3.3* 2.7*  CL 101  --  98 100  CO2 25  --  27 25  GLUCOSE 291*  --  286* 203*  BUN 29*  --  38* 31*  CREATININE 2.28*  --  2.89* 2.47*  CALCIUM 8.5*  --  7.9* 8.3*  MG  --  2.1  --   --    GFR: Estimated Creatinine Clearance: 33.6 mL/min (A) (by C-G formula based on SCr of 2.47 mg/dL (H)). Liver Function Tests: Recent Labs  Lab 02/21/18 1313  AST 30  ALT 26  ALKPHOS 81  BILITOT 1.0  PROT 8.5*  ALBUMIN 4.2   No results for input(s): LIPASE, AMYLASE in the last 168 hours. No results for input(s): AMMONIA in the last 168 hours. Coagulation Profile: No results for input(s): INR, PROTIME in the last 168 hours. Cardiac Enzymes: Recent Labs  Lab 02/21/18 1313 02/22/18 0051 02/22/18 0650 02/22/18 1305  TROPONINI 0.03* 0.06* 0.07* 0.07*   BNP (last 3 results) No results for input(s): PROBNP in the last 8760 hours. HbA1C: Recent Labs    02/21/18 1305  HGBA1C 7.2*   CBG: Recent Labs  Lab 02/21/18 2100 02/22/18 0826 02/22/18 1129 02/22/18 1556 02/22/18 2027  GLUCAP 315* 218* 214* 165* 178*   Lipid Profile: Recent Labs    02/23/18 0435  CHOL 154  HDL 31*  LDLCALC 83  TRIG 161*  CHOLHDL 5.0   Thyroid Function Tests: No results for input(s): TSH, T4TOTAL, FREET4, T3FREE, THYROIDAB in the last 72 hours. Anemia Panel: No results for input(s): VITAMINB12, FOLATE, FERRITIN, TIBC,  IRON, RETICCTPCT in the last 72 hours. Sepsis Labs: No results for input(s): PROCALCITON, LATICACIDVEN in the last 168 hours.  Recent Results (from the past 240 hour(s))  MRSA PCR Screening     Status: None   Collection Time: 02/21/18  5:17 PM  Result Value Ref Range Status   MRSA by PCR NEGATIVE NEGATIVE Final    Comment:        The GeneXpert MRSA Assay (FDA approved for NASAL specimens only), is one component of a comprehensive MRSA colonization surveillance program. It is not intended to diagnose MRSA infection nor to guide or monitor treatment for MRSA infections. Performed at [facility], [address], [place], [place] [address]     Radiology Studies: Dg Chest [facility] 1 View  Result  Date: 02/21/2018 CLINICAL DATA:  Initial evaluation for acute shortness of breath. EXAM: PORTABLE CHEST 1 VIEW COMPARISON:  Prior radiograph from 01/15/2016. FINDINGS: Cardiomegaly, stable from previous.  Mediastinal silhouette normal. Lungs normally inflated. Diffuse vascular congestion with interstitial prominence, consistent with pulmonary interstitial edema. No pleural effusion. No consolidative airspace disease. No pneumothorax. Osseous structures within normal limits. IMPRESSION: Cardiomegaly with mild diffuse pulmonary interstitial edema. Electronically Signed   By: Rise Mu M.D.   On: 02/21/2018 13:26   Scheduled Meds: . amLODipine  10 mg Oral Daily  . aspirin EC  81 mg Oral Daily  . cloNIDine  0.2 mg Oral Once  . enoxaparin (LOVENOX) injection  30 mg Subcutaneous Q24H  . insulin aspart  0-20 Units Subcutaneous TID WC  . insulin aspart  0-5 Units Subcutaneous QHS  . Living Better with Heart Failure Book   Does not apply Once  . mouth rinse  15 mL Mouth Rinse BID  . metoCLOPramide (REGLAN) injection  5 mg Intravenous Q6H  . potassium chloride  40 mEq Oral Once  . potassium chloride  40 mEq Oral Once  . sodium chloride flush  3 mL Intravenous Q12H   Continuous Infusions: .  sodium chloride    . nitroGLYCERIN 130 mcg/min (02/23/18 0604)     LOS: 2 days   Critical Care Time spent: 33 mins  Standley Dakins, MD [facility] Pager [phone]  If 7PM-7AM, please contact night-coverage [email or web address] Password [email or web address] 02/23/2018, 6:06 AM

## 2018-02-23 NOTE — Care Management Note (Signed)
Case Management Note  Patient Details  Name: Rosalie Gelpi MRN: [id number] Date of Birth: [birth year]  Subjective/Objective:   Hypertensive crisis. From home, independent. No PCP or insurance.                   Action/Plan: Appt made with [facility] to help with follow up care post DC. MATCH voucher given for medications. Discussed $4 dollar medications at [facility] for future reference.   Expected Discharge Date:     02/24/2018             Expected Discharge Plan:  Home/Self Care  In-House Referral:     Discharge planning Services  CM Consult, Follow-up appt scheduled, [facility], [facility]  Post Acute Care Choice:  NA Choice offered to:  NA  DME Arranged:    DME Agency:     HH Arranged:    HH Agency:     Status of Service:  Completed, signed off  If discussed at [employer], dates discussed:    Additional Comments:  Brylyn Novakovich, Chrystine Oiler, RN 02/23/2018, 11:16 AM

## 2018-02-23 NOTE — Progress Notes (Signed)
Progress Note  Patient Name: Matthew Robles Date of Encounter: 02/23/2018  Primary Cardiologist: No primary care provider on file.   Subjective  No complaints   Inpatient Medications    Scheduled Meds: . amLODipine  10 mg Oral Daily  . aspirin EC  81 mg Oral Daily  . enoxaparin (LOVENOX) injection  30 mg Subcutaneous Q24H  . insulin aspart  0-20 Units Subcutaneous TID WC  . insulin aspart  0-5 Units Subcutaneous QHS  . Living Better with Heart Failure Book   Does not apply Once  . mouth rinse  15 mL Mouth Rinse BID  . potassium chloride  40 mEq Oral Once  . sodium chloride flush  3 mL Intravenous Q12H   Continuous Infusions: . sodium chloride    . nitroGLYCERIN 130 mcg/min (02/23/18 0604)   PRN Meds: sodium chloride, acetaminophen, albuterol, guaiFENesin-dextromethorphan, lip balm, metoCLOPramide, ondansetron (ZOFRAN) IV, polyvinyl alcohol, sodium chloride, sodium chloride flush   Vital Signs    Vitals:   02/23/18 0630 02/23/18 0645 02/23/18 0700 02/23/18 0730  BP: (!) 195/117 (!) 184/117 (!) 164/98 (!) 156/85  Pulse: 91 92 83 84  Resp: 14 15 11 17   Temp:      TempSrc:      SpO2: 98% 97% 98% 97%  Weight:      Height:        Intake/Output Summary (Last 24 hours) at 02/23/2018 0810 Last data filed at 02/23/2018 0604 Gross per 24 hour  Intake 336.08 ml  Output 1600 ml  Net -1263.92 ml   Filed Weights   02/21/18 1720 02/22/18 0500 02/23/18 0400  Weight: 83.4 kg 83.4 kg 82 kg    Telemetry    SR - Personally Reviewed  ECG    na  Physical Exam   GEN: No acute distress.   Neck: No JVD Cardiac: RRR, no murmurs, rubs, or gallops.  Respiratory: Clear to auscultation bilaterally. GI: Soft, nontender, non-distended  MS: No edema; No deformity. Neuro:  Nonfocal  Psych: Normal affect   Labs    Chemistry Recent Labs  Lab 02/21/18 1313 02/22/18 0650 02/23/18 0435  NA 140 138 139  K 2.5* 3.3* 2.7*  CL 101 98 100  CO2 25 27 25   GLUCOSE 291* 286*  203*  BUN 29* 38* 31*  CREATININE 2.28* 2.89* 2.47*  CALCIUM 8.5* 7.9* 8.3*  PROT 8.5*  --   --   ALBUMIN 4.2  --   --   AST 30  --   --   ALT 26  --   --   ALKPHOS 81  --   --   BILITOT 1.0  --   --   GFRNONAA 29* 22* 26*  GFRAA 33* 25* 30*  ANIONGAP 14 13 14      Hematology Recent Labs  Lab 02/21/18 1313  WBC 19.4*  RBC 4.03*  HGB 12.7*  HCT 37.8*  MCV 93.8  MCH 31.5  MCHC 33.6  RDW 14.2  PLT 333    Cardiac Enzymes Recent Labs  Lab 02/21/18 1313 02/22/18 0051 02/22/18 0650 02/22/18 1305  TROPONINI 0.03* 0.06* 0.07* 0.07*   No results for input(s): TROPIPOC in the last 168 hours.   BNP Recent Labs  Lab 02/21/18 1307  BNP 795.0*     DDimer No results for input(s): DDIMER in the last 168 hours.   Radiology    Dg Chest Port 1 View  Result Date: 02/21/2018 CLINICAL DATA:  Initial evaluation for acute shortness of breath. EXAM: PORTABLE  CHEST 1 VIEW COMPARISON:  Prior radiograph from 01/15/2016. FINDINGS: Cardiomegaly, stable from previous.  Mediastinal silhouette normal. Lungs normally inflated. Diffuse vascular congestion with interstitial prominence, consistent with pulmonary interstitial edema. No pleural effusion. No consolidative airspace disease. No pneumothorax. Osseous structures within normal limits. IMPRESSION: Cardiomegaly with mild diffuse pulmonary interstitial edema. Electronically Signed   By: Rise Mu M.D.   On: 02/21/2018 13:26    Cardiac Studies   Patient Profile     Matthew Robles is a 62 y.o. male with a hx of HTN who is being seen today for the evaluation of severe hypertension at the request of Dr Kerry Hough.  Assessment & Plan    1. Hypertensive emergency - he stopped all his home meds on his own - admitted with SBP in 260s, CXR with pulmonary edema - started on IV labtetalol and IV NG drips. Off labetolol, remains on NG gtt.  - renal dysfunction limits bp med options. Will start labetlol 400mg  po bid. Continue norvacs 5mg   daily, wean NG gtt. Can titrate labetalol pending heart rates, if low HRs without room to titrate would start hydralazine.    2. Elevated troponin - mild elevation in setting of severe HTN and CKD, likely demand ischemia.  Echo with LVE 50-55%, no WMAs, grade II diastolic dysfunction, mild RV dysfunction, severe biatrial enlargement.   3. 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 Admitted Cr 2.28 near his baseline, peak of 2.89 this admit trending down.  - no further diuretics.   4. History of CVA - continue ASA, would benefit from statin. Would start atorva 40mg  daily.   5. Nausea/vomiting - per primary team  6. Hypokalemia - per primary team  For questions or updates, please contact [facility] Please consult [email or web address] for contact info under        Signed, Dina Rich, MD  02/23/2018, 8:10 AM

## 2018-02-24 ENCOUNTER — Inpatient Hospital Stay (HOSPITAL_COMMUNITY): Payer: Self-pay

## 2018-02-24 DIAGNOSIS — I5033 Acute on chronic diastolic (congestive) heart failure: Secondary | ICD-10-CM

## 2018-02-24 LAB — BASIC METABOLIC PANEL
Anion gap: 10 (ref 5–15)
BUN: 25 mg/dL — AB (ref 8–23)
CHLORIDE: 100 mmol/L (ref 98–111)
CO2: 25 mmol/L (ref 22–32)
CREATININE: 2.19 mg/dL — AB (ref 0.61–1.24)
Calcium: 8.2 mg/dL — ABNORMAL LOW (ref 8.9–10.3)
GFR calc Af Amer: 35 mL/min — ABNORMAL LOW (ref >60)
GFR calc non Af Amer: 30 mL/min — ABNORMAL LOW (ref >60)
GLUCOSE: 183 mg/dL — AB (ref 70–99)
POTASSIUM: 3.3 mmol/L — AB (ref 3.5–5.1)
SODIUM: 135 mmol/L (ref 135–145)

## 2018-02-24 LAB — GLUCOSE, CAPILLARY
Glucose-Capillary: 176 mg/dL — ABNORMAL HIGH (ref 70–99)
Glucose-Capillary: 161 mg/dL — ABNORMAL HIGH (ref 70–99)
Glucose-Capillary: 158 mg/dL — ABNORMAL HIGH (ref 70–99)
Glucose-Capillary: 261 mg/dL — ABNORMAL HIGH (ref 70–99)

## 2018-02-24 LAB — MAGNESIUM: MAGNESIUM: 2.1 mg/dL (ref 1.7–2.4)

## 2018-02-24 MED ORDER — POTASSIUM CHLORIDE CRYS ER 20 MEQ PO TBCR
40.0000 meq | EXTENDED_RELEASE_TABLET | Freq: Once | ORAL | Status: AC
  Administered 2018-02-24: 40 meq via ORAL
  Filled 2018-02-24: qty 2

## 2018-02-24 MED ORDER — HYDRALAZINE HCL 25 MG PO TABS
75.0000 mg | ORAL_TABLET | Freq: Three times a day (TID) | ORAL | Status: DC
  Administered 2018-02-24 – 2018-02-25 (×3): 75 mg via ORAL
  Filled 2018-02-24 (×3): qty 3

## 2018-02-24 NOTE — Consult Note (Signed)
Reason for Consult: Renal failure Referring Physician: Dr. Jefm Bryant is an 62 y.o. male.  HPI: He is a patient who has history of long-standing diabetes, hypertension since childhood, CVA presently came with complaints of weakness, dizziness for the last couple of days.  Patient denies any nausea or vomiting.  Patient states that he was not taking any medication and he does not have even a primary care physician.  He states he does not want to take any medication for his blood pressure and try to control it with diet only.  When he is was evaluated he was found to have high blood pressure was worsening of renal failure.  At this moment he denies any nausea or vomiting.  His appetite is good.  Past Medical History:  Diagnosis Date  . Diabetes mellitus without complication ([place])   . Hypertension   . Stroke [facility])     History reviewed. No pertinent surgical history.  History reviewed. No pertinent family history.  Social History:  reports that he has been smoking cigarettes. He has been smoking about 1.00 pack per day. He has never used smokeless tobacco. He reports that he has current or past drug history. Drug: Marijuana. He reports that he does not drink alcohol.  Allergies: No Known Allergies  Medications: I have reviewed the patient's current medications.  Results for orders placed or performed during the hospital encounter of 02/21/18 (from the past 48 hour(s))  Glucose, capillary     Status: Abnormal   Collection Time: 02/22/18 11:29 AM  Result Value Ref Range   Glucose-Capillary 214 (H) 70 - 99 mg/dL  Troponin I     Status: Abnormal   Collection Time: 02/22/18  1:05 PM  Result Value Ref Range   Troponin I 0.07 (HH) <0.03 ng/mL    Comment: CRITICAL VALUE NOTED.  VALUE IS CONSISTENT WITH PREVIOUSLY REPORTED AND CALLED VALUE. Performed at [facility], [address] [place], [place] [address]   Glucose, capillary     Status: Abnormal   Collection Time: 02/22/18   3:56 PM  Result Value Ref Range   Glucose-Capillary 165 (H) 70 - 99 mg/dL  Glucose, capillary     Status: Abnormal   Collection Time: 02/22/18  8:27 PM  Result Value Ref Range   Glucose-Capillary 178 (H) 70 - 99 mg/dL   Comment 1 Notify RN    Comment 2 Document in Chart   Basic metabolic panel     Status: Abnormal   Collection Time: 02/23/18  4:35 AM  Result Value Ref Range   Sodium 139 135 - 145 mmol/L   Potassium 2.7 (LL) 3.5 - 5.1 mmol/L    Comment: CRITICAL RESULT CALLED TO, READ BACK BY AND VERIFIED WITH: SMITH,J AT 5:25AM ON 02/23/18 BY FESTERMAN,C    Chloride 100 98 - 111 mmol/L   CO2 25 22 - 32 mmol/L   Glucose, Bld 203 (H) 70 - 99 mg/dL   BUN 31 (H) 8 - 23 mg/dL   Creatinine, Ser 2.47 (H) 0.61 - 1.24 mg/dL   Calcium 8.3 (L) 8.9 - 10.3 mg/dL   GFR calc non Af Amer 26 (L) >60 mL/min   GFR calc Af Amer 30 (L) >60 mL/min    Comment: (NOTE) The eGFR has been calculated using the CKD EPI equation. This calculation has not been validated in all clinical situations. eGFR's persistently <60 mL/min signify possible Chronic Kidney Disease.    Anion gap 14 5 - 15    Comment: Performed  at [facility], [address], [place], [place] [address]  Lipid panel     Status: Abnormal   Collection Time: 02/23/18  4:35 AM  Result Value Ref Range   Cholesterol 154 0 - 200 mg/dL   Triglycerides 198 (H) <150 mg/dL   HDL 31 (L) >40 mg/dL   Total CHOL/HDL Ratio 5.0 RATIO   VLDL 40 0 - 40 mg/dL   LDL Cholesterol 83 0 - 99 mg/dL    Comment:        Total Cholesterol/HDL:CHD Risk Coronary Heart Disease Risk Table                     Men   Women  1/2 Average Risk   3.4   3.3  Average Risk       5.0   4.4  2 X Average Risk   9.6   7.1  3 X Average Risk  23.4   11.0        Use the calculated Patient Ratio above and the CHD Risk Table to determine the patient's CHD Risk.        ATP III CLASSIFICATION (LDL):  <100     mg/dL   Optimal  100-129  mg/dL   Near or Above                     Optimal  130-159  mg/dL   Borderline  160-189  mg/dL   High  >190     mg/dL   Very High Performed at [address] [place], [place] [address]   Magnesium     Status: Abnormal   Collection Time: 02/23/18  4:35 AM  Result Value Ref Range   Magnesium 1.5 (L) 1.7 - 2.4 mg/dL    Comment: Performed at [facility], [address], [place], [place] [address]  Glucose, capillary     Status: Abnormal   Collection Time: 02/23/18  8:14 AM  Result Value Ref Range   Glucose-Capillary 207 (H) 70 - 99 mg/dL  Glucose, capillary     Status: Abnormal   Collection Time: 02/23/18 11:45 AM  Result Value Ref Range   Glucose-Capillary 171 (H) 70 - 99 mg/dL  Glucose, capillary     Status: Abnormal   Collection Time: 02/23/18  4:20 PM  Result Value Ref Range   Glucose-Capillary 213 (H) 70 - 99 mg/dL  Glucose, capillary     Status: Abnormal   Collection Time: 02/23/18  8:32 PM  Result Value Ref Range   Glucose-Capillary 159 (H) 70 - 99 mg/dL  Basic metabolic panel     Status: Abnormal   Collection Time: 02/24/18  4:14 AM  Result Value Ref Range   Sodium 135 135 - 145 mmol/L   Potassium 3.3 (L) 3.5 - 5.1 mmol/L    Comment: DELTA CHECK NOTED   Chloride 100 98 - 111 mmol/L   CO2 25 22 - 32 mmol/L   Glucose, Bld 183 (H) 70 - 99 mg/dL   BUN 25 (H) 8 - 23 mg/dL   Creatinine, Ser 2.19 (H) 0.61 - 1.24 mg/dL   Calcium 8.2 (L) 8.9 - 10.3 mg/dL   GFR calc non Af Amer 30 (L) >60 mL/min   GFR calc Af Amer 35 (L) >60 mL/min    Comment: (NOTE) The eGFR has been calculated using the CKD EPI equation. This calculation has not been validated in all clinical situations. eGFR's persistently <60 mL/min signify possible Chronic Kidney  Disease.    Anion gap 10 5 - 15    Comment: Performed at [facility], [address] [place], [place] [address]  Magnesium     Status: None   Collection Time: 02/24/18  4:14 AM  Result Value Ref Range   Magnesium 2.1 1.7 - 2.4 mg/dL    Comment: Performed at [facility], [address] [place], [place] [address]  Glucose, capillary     Status: Abnormal   Collection Time: 02/24/18  7:30 AM  Result Value Ref Range   Glucose-Capillary 176 (H) 70 - 99 mg/dL    Dg Chest Port 1 View  Result Date: 02/24/2018 CLINICAL DATA:  Acute presentation with weakness and dizziness. Hypertension. EXAM: PORTABLE CHEST 1 VIEW COMPARISON:  02/21/2018 FINDINGS: Cardiomegaly as seen previously. Slightly ectatic aorta. Lungs are clear. The pulmonary vascularity is normal. No infiltrate or effusion. IMPRESSION: No active disease.  Cardiomegaly and ectatic aorta. Electronically Signed   By: Nelson Chimes M.D.   On: 02/24/2018 08:19    Review of Systems  Constitutional: Positive for malaise/fatigue. Negative for chills, fever and weight loss.  Respiratory: Negative for cough and shortness of breath.   Cardiovascular: Positive for leg swelling. Negative for chest pain, orthopnea and claudication.  Gastrointestinal: Negative for abdominal pain, nausea and vomiting.  Neurological: Positive for dizziness.   Blood pressure (!) 150/83, pulse 79, temperature 98 F (36.7 C), temperature source Oral, resp. rate 11, height 6' (1.829 m), weight 82.3 kg, SpO2 98 %. Physical Exam  Constitutional: He is oriented to person, place, and time. No distress.  Eyes: No scleral icterus.  Neck: No JVD present.  Cardiovascular: Normal rate and regular rhythm.  No murmur heard. Respiratory: No respiratory distress. He has no wheezes.  GI: He exhibits no distension. There is no tenderness.  Musculoskeletal: He exhibits no edema.  Neurological: He is alert and oriented to person, place, and time.    Assessment/Plan: 1] hypertensive emergency: His blood pressure was 264/147 when he came.  As stated above patient was not taking any medication and try to control it with diet only.  He states that he has high blood pressure since childhood.  Presently except some dizziness he is feeling better.  He  denies any headache.  His blood pressure is somewhat better. 2] renal failure: Seems to be chronic.  His creatinine was 1.501 01/14/2016.  Increased to 2.71 on 01/16/2016.  His last blood work was on 01/17/2017 with creatinine of 2.24 and EGFR of 34 cc/min/1.73 which is stage III chronic renal failure.  Presently his creatinine was somewhat higher than his baseline but has improved.  Presently patient is a symptomatic.  Etiology could be secondary to diabetes and poorly controlled hypertension.  Other etiologies at this moment cannot be ruled out. 3] diabetes: Poorly controlled. 4] proteinuria: Possibly secondary to diabetes versus hypertension.  He had 100 mg/dL of proteinuria in the urine. 5] history of CVA 6] hypokalemia: Potassium presently is normal. 7] history of CHF.  Patient at this moment does not have any sign of fluid overload. 8] noncompliance with medications. Plan: 1] we will check ANA, complement, hepatitis B surface antigen, hepatitis C antibody 2] we we will do ultrasound of the kidneys 3] we will check 24-hour urine for protein and immunoelectrophoresis 4] we will check his renal panel in the morning.   Theon Sobotka S 02/24/2018, 9:39 AM

## 2018-02-24 NOTE — Evaluation (Signed)
Physical Therapy Evaluation Patient Details Name: Matthew Robles MRN: [id number] DOB: [birth year] Today's Date: 02/24/2018   History of Present Illness  62 yo male with onset of elevated troponin and CHF exacerbation, elevation of BP,  ectatic aorta, and low K+ with protein in his urine was admitted.  He has PMHx: cardiomegaly, DM, HTN, Stroke,    Clinical Impression  Pt was seen for evaluation of mobility including assessing vitals with gait.  He was at 168/108 pregait and post gait noted 98% O2 sats, pulses 91 and BP was 189/104.  He is able to walk with min guard on RW and will recommend very short stay SNF to restore mobility with control of BP.  Follow acutely for the same.    Follow Up Recommendations SNF    Equipment Recommendations  None recommended by PT    Recommendations for Other Services       Precautions / Restrictions Precautions Precautions: Fall Precaution Comments: telemetry Restrictions Weight Bearing Restrictions: No      Mobility  Bed Mobility Overal bed mobility: Needs Assistance Bed Mobility: Supine to Sit;Sit to Supine     Supine to sit: Supervision Sit to supine: Supervision   General bed mobility comments: cues for safety and hand placement  Transfers Overall transfer level: Needs assistance Equipment used: Rolling walker (2 wheeled) Transfers: Sit to/from Stand Sit to Stand: Min assist         General transfer comment: cued and assisted powering up  Ambulation/Gait Ambulation/Gait assistance: Min guard Gait Distance (Feet): 200 Feet Assistive device: Rolling walker (2 wheeled);1 person hand held assist Gait Pattern/deviations: Step-to pattern;Step-through pattern;Decreased stride length;Narrow base of support;Trunk flexed Gait velocity: reduced   General Gait Details: turns with cues, tends to choose riskier path for gait  Stairs            Wheelchair Mobility    Modified Rankin (Stroke Patients Only)       Balance  Overall balance assessment: Needs assistance Sitting-balance support: Feet supported Sitting balance-Leahy Scale: Good     Standing balance support: Bilateral upper extremity supported Standing balance-Leahy Scale: Fair                               Pertinent Vitals/Pain Pain Assessment: No/denies pain    Home Living Family/patient expects to be discharged to:: Private residence Living Arrangements: Alone Available Help at Discharge: Family;Available PRN/intermittently Type of Home: House Home Access: Stairs to enter Entrance Stairs-Rails: Left(uses door for RUE) Entrance Stairs-Number of Steps: 3 Home Layout: One level Home Equipment: Environmental consultant - 2 wheels Additional Comments: has not needed equipment for home    Prior Function Level of Independence: Independent with assistive device(s)               Hand Dominance   Dominant Hand: Right    Extremity/Trunk Assessment   Upper Extremity Assessment Upper Extremity Assessment: Overall WFL for tasks assessed    Lower Extremity Assessment Lower Extremity Assessment: Generalized weakness    Cervical / Trunk Assessment Cervical / Trunk Assessment: Normal  Communication   Communication: No difficulties  Cognition Arousal/Alertness: Awake/alert Behavior During Therapy: WFL for tasks assessed/performed Overall Cognitive Status: Within Functional Limits for tasks assessed                                        General Comments General  comments (skin integrity, edema, etc.): pt has elevated BP with pregait readings 168/108 and post gait 189/104.      Exercises     Assessment/Plan    PT Assessment Patient needs continued PT services  PT Problem List Decreased strength;Decreased range of motion;Decreased activity tolerance;Decreased balance;Decreased mobility;Decreased coordination;Decreased knowledge of use of DME;Decreased safety awareness;Cardiopulmonary status limiting activity        PT Treatment Interventions DME instruction;Gait training;Stair training;Functional mobility training;Therapeutic activities;Balance training;Therapeutic exercise;Neuromuscular re-education;Patient/family education    PT Goals (Current goals can be found in the Care Plan section)  Acute Rehab PT Goals Patient Stated Goal: to get home and feel better PT Goal Formulation: With patient/family Time For Goal Achievement: 03/10/18 Potential to Achieve Goals: Good    Frequency Min 2X/week   Barriers to discharge Inaccessible home environment;Decreased caregiver support home alone with stairs to enter house    Co-evaluation               AM-PAC PT "6 Clicks" Daily Activity  Outcome Measure Difficulty turning over in bed (including adjusting bedclothes, sheets and blankets)?: A Little Difficulty moving from lying on back to sitting on the side of the bed? : A Little Difficulty sitting down on and standing up from a chair with arms (e.g., wheelchair, bedside commode, etc,.)?: Unable Help needed moving to and from a bed to chair (including a wheelchair)?: A Little Help needed walking in hospital room?: A Little Help needed climbing 3-5 steps with a railing? : A Little 6 Click Score: 16    End of Session Equipment Utilized During Treatment: Gait belt Activity Tolerance: Patient tolerated treatment well Patient left: in bed;with call bell/phone within reach;with family/visitor present;with bed alarm set Nurse Communication: Mobility status PT Visit Diagnosis: Unsteadiness on feet (R26.81);Muscle weakness (generalized) (M62.81);Difficulty in walking, not elsewhere classified (R26.2)    Time: 1610-9604 PT Time Calculation (min) (ACUTE ONLY): 36 min   Charges:   PT Evaluation $PT Eval Moderate Complexity: 1 Mod PT Treatments $Gait Training: 8-22 mins       Ivar Drape 02/24/2018, 9:49 PM  9:55 PM, 02/24/18 [name], PT, MS Physical Therapist - [place] [phone]  [phone] (Office)

## 2018-02-24 NOTE — Progress Notes (Signed)
Progress Note  Patient Name: Matthew Robles Date of Encounter: 02/24/2018  Primary Cardiologist: New  Subjective   No complaints  Inpatient Medications    Scheduled Meds: . amLODipine  10 mg Oral Daily  . aspirin EC  81 mg Oral Daily  . atorvastatin  40 mg Oral q1800  . cloNIDine  0.1 mg Oral BID  . enoxaparin (LOVENOX) injection  40 mg Subcutaneous Q24H  . hydrALAZINE  75 mg Oral Q8H  . insulin aspart  0-20 Units Subcutaneous TID WC  . insulin aspart  0-5 Units Subcutaneous QHS  . labetalol  400 mg Oral BID  . Living Better with Heart Failure Book   Does not apply Once  . mouth rinse  15 mL Mouth Rinse BID  . sodium chloride flush  3 mL Intravenous Q12H   Continuous Infusions: . sodium chloride 15 mL/hr at 02/24/18 0602  . nitroGLYCERIN 105 mcg/min (02/24/18 0602)   PRN Meds: sodium chloride, acetaminophen, albuterol, guaiFENesin-dextromethorphan, lip balm, metoCLOPramide, ondansetron (ZOFRAN) IV, polyvinyl alcohol, sodium chloride, sodium chloride flush, zolpidem   Vital Signs    Vitals:   02/24/18 0600 02/24/18 0700 02/24/18 0723 02/24/18 0800  BP: (!) 194/101 (!) 183/105  (!) 150/83  Pulse: 86 86  79  Resp: 13 15  11   Temp:   98 F (36.7 C)   TempSrc:   Oral   SpO2: 100% 97%  98%  Weight:      Height:        Intake/Output Summary (Last 24 hours) at 02/24/2018 0903 Last data filed at 02/24/2018 0842 Gross per 24 hour  Intake 1242.73 ml  Output 3250 ml  Net -2007.27 ml   Filed Weights   02/22/18 0500 02/23/18 0400 02/24/18 0500  Weight: 83.4 kg 82 kg 82.3 kg    Telemetry    SR, short run NSVT - Personally Reviewed  ECG    na  Physical Exam   GEN: No acute distress.   Neck: No JVD Cardiac: RRR, no murmurs, rubs, or gallops.  Respiratory: Clear to auscultation bilaterally. GI: Soft, nontender, non-distended  MS: No edema; No deformity. Neuro:  Nonfocal  Psych: Normal affect   Labs    Chemistry Recent Labs  Lab 02/21/18 1313  02/22/18 0650 02/23/18 0435 02/24/18 0414  NA 140 138 139 135  K 2.5* 3.3* 2.7* 3.3*  CL 101 98 100 100  CO2 25 27 25 25   GLUCOSE 291* 286* 203* 183*  BUN 29* 38* 31* 25*  CREATININE 2.28* 2.89* 2.47* 2.19*  CALCIUM 8.5* 7.9* 8.3* 8.2*  PROT 8.5*  --   --   --   ALBUMIN 4.2  --   --   --   AST 30  --   --   --   ALT 26  --   --   --   ALKPHOS 81  --   --   --   BILITOT 1.0  --   --   --   GFRNONAA 29* 22* 26* 30*  GFRAA 33* 25* 30* 35*  ANIONGAP 14 13 14 10      Hematology Recent Labs  Lab 02/21/18 1313  WBC 19.4*  RBC 4.03*  HGB 12.7*  HCT 37.8*  MCV 93.8  MCH 31.5  MCHC 33.6  RDW 14.2  PLT 333    Cardiac Enzymes Recent Labs  Lab 02/21/18 1313 02/22/18 0051 02/22/18 0650 02/22/18 1305  TROPONINI 0.03* 0.06* 0.07* 0.07*   No results for input(s): TROPIPOC in the  last 168 hours.   BNP Recent Labs  Lab 02/21/18 1307  BNP 795.0*     DDimer No results for input(s): DDIMER in the last 168 hours.   Radiology    Dg Chest Port 1 View  Result Date: 02/24/2018 CLINICAL DATA:  Acute presentation with weakness and dizziness. Hypertension. EXAM: PORTABLE CHEST 1 VIEW COMPARISON:  02/21/2018 FINDINGS: Cardiomegaly as seen previously. Slightly ectatic aorta. Lungs are clear. The pulmonary vascularity is normal. No infiltrate or effusion. IMPRESSION: No active disease.  Cardiomegaly and ectatic aorta. Electronically Signed   By: Paulina Fusi M.D.   On: 02/24/2018 08:19    Cardiac Studies    Patient Profile     Matthew Robles a 62 y.o.malewith a hx of HTNwho is being seen today for the evaluation of severe hypertensionat the request of Dr Kerry Hough.  Assessment & Plan    1. Hypertensive emergency - he stopped all his home meds on his own - admitted with SBP in 260s, CXR with pulmonary edema - started on IV labtetalol and IV NG drips. Off labetolol, remains on NG gtt.  - renal dysfunction limits bp med options.   - he is on norvasc 10mg , labetolol 400mg   bid, clonidine 0.1mg  bid.Started on hydral 75mg  tid today Remains on NG drip. Room to tirate labetalol and hydarlazine further, continue to follow bp's today. May benefit from loop diuretic, defer to renal who will be seeing patient.    2. Elevated troponin - mild elevation in setting of severe HTN and CKD, likely demand ischemia.  Echo with LVE 50-55%, no WMAs, grade II diastolic dysfunction, mild RV dysfunction, severe biatrial enlargement.   3. 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 Admitted Cr 2.28 near his baseline, peak of 2.89 this admit trending down.  - no further diuretics.   4. History of CVA - continue ASA, would benefit from statin. Would start atorva 40mg  daily.   5. Nausea/vomiting - per primary team  6. Hypokalemia - per primary team   For questions or updates, please contact [facility] Please consult [email or web address] for contact info under        Signed, Dina Rich, MD  02/24/2018, 9:03 AM

## 2018-02-24 NOTE — Progress Notes (Signed)
PT Cancellation Note  Patient Details Name: Matthew Robles MRN: [id number] DOB: 19-Jun-1955   Cancelled Treatment:    Reason Eval/Treat Not Completed: Other (comment).  Attempted but has ear issues and asked to wait PT evaluation until MD sees him.  Try later as time and pt allow.   Ivar Drape 02/24/2018, 11:08 AM   11:08 AM, 02/24/18 [name], PT, MS Physical Therapist - [place] [phone] (Office)

## 2018-02-24 NOTE — Progress Notes (Signed)
PROGRESS NOTE    Duvall Comes  GEX:[id number] DOB: 03/23/1956 DOA: 02/21/2018 PCP: Patient, No Pcp Per    Brief Narrative:  62 year old male admitted to the hospital with weakness and dizziness.  Found to have hypertensive emergency with blood pressure of 264/147.  Started on labetalol and nitroglycerin infusions.  Also had decompensated CHF.  Started on IV Lasix.  Cardiology following.   Assessment & Plan:   Active Problems:   DM (diabetes mellitus), type 2 with neurological complications (HCC)   Hypokalemia   Hypertensive emergency   Congestive heart failure (HCC)   Vomiting   CKD (chronic kidney disease) stage 3, GFR 30-59 ml/min (HCC)   Noncompliance with medications   1. Hypertensive emergency.   Pt remains on IV nitroglycerin infusion. He is been started on Norvasc, labetalol, clonidine, hydralazine.  BP remains elevated and difficult to control.  Given his CKD will ask for nephrology to assist with hypertension management.     2. Acute diastolic congestive heart failure secondary to hypertensive emergency.  Echo shows preserved EF with grade 2 diastolic dysfunction.  He did present with shortness of breath.  He was started on IV Lasix and his shortness of breath is better. Lasix has been discontinued due to bump in creatinine.  Cardiology following.  Repeat CXR today to follow up pulmonary edema.  3. Hypokalemia - oral replacement ordered.  Magnesium has been repleted.   4. AKI on CKD stage III.  Mild improvement in creatinine.  I have asked for renal consultation.   Continue to monitor renal function.  We may be able to restart lasix to assist with managing blood pressure.  5. Diabetes type 2 with renal complications -  A1c is 7.2.  He is currently on sliding scale insulin.  Blood glucose controlled.  6. Nausea/Vomiting.  Improved.  Unclear etiology.   Continue solid diet as tolerated.  Abdomen otherwise feels benign.  LFTs unremarkable.  DC IV metoclopramide.     DVT  prophylaxis: Lovenox Code Status: Full code Family Communication: No family present Disposition Plan: Discharge home once improved  Consultants:   Cardiology  Nephrology  Procedures:  Echo: - Left ventricle: The cavity size was normal. Wall thickness was   increased in a pattern of moderate LVH. Systolic function was   normal. The estimated ejection fraction was in the range of 50%   to 55%. Wall motion was normal; there were no regional wall   motion abnormalities. Features are consistent with a pseudonormal   left ventricular filling pattern, with concomitant abnormal   relaxation and increased filling pressure (grade 2 diastolic   dysfunction). Doppler parameters are consistent with high   ventricular filling pressure. - Aortic valve: Mildly calcified annulus. Trileaflet; mildly   thickened leaflets. Valve area (VTI): 2.17 cm^2. Valve area   (Vmax): 2.28 cm^2. - Left atrium: The atrium was severely dilated. - Right ventricle: The cavity size was mildly dilated. Systolic   function was mildly reduced. - Right atrium: The atrium was severely dilated.  - Technically adequate study.  REVIEW OF SYSTEMS As noted otherwise all reviewed and reported negative.   Subjective: Pt has been more agitated and says he is not sleeping well.  No chest pain and no SOB.      Objective: Vitals:   02/24/18 0430 02/24/18 0500 02/24/18 0530 02/24/18 0600  BP: (!) 182/105 (!) 177/117  (!) 194/101  Pulse: 90 88 85 86  Resp: 19 20 15 13   Temp:      TempSrc:  SpO2: 95% 98% 98% 100%  Weight:      Height:        Intake/Output Summary (Last 24 hours) at 02/24/2018 0621 Last data filed at 02/24/2018 0602 Gross per 24 hour  Intake 1006.73 ml  Output 2500 ml  Net -1493.27 ml   Filed Weights   02/21/18 1720 02/22/18 0500 02/23/18 0400  Weight: 83.4 kg 83.4 kg 82 kg    Examination:  General exam: Awake, alert, NAD.  Appears calm and comfortable.   Respiratory system: Clear to  auscultation. Respiratory effort normal. Cardiovascular system: normal S1 & S2 heard. No JVD, murmurs, rubs, gallops or clicks. 1+ pedal edema. Gastrointestinal system: Abdomen is nondistended, soft and nontender. No organomegaly or masses felt. Normal bowel sounds heard. Central nervous system: Alert and oriented. No focal neurological deficits. Extremities: Symmetric 5 x 5 power. Skin: No rashes, lesions or ulcers Psychiatry: Judgement and insight appear normal. Mood & affect appropriate.   Data Reviewed: I have personally reviewed following labs and imaging studies  CBC: Recent Labs  Lab 02/21/18 1313  WBC 19.4*  NEUTROABS 16.2*  HGB 12.7*  HCT 37.8*  MCV 93.8  PLT 333   Basic Metabolic Panel: Recent Labs  Lab 02/21/18 1313 02/21/18 1556 02/22/18 0650 02/23/18 0435 02/24/18 0414  NA 140  --  138 139 135  K 2.5*  --  3.3* 2.7* 3.3*  CL 101  --  98 100 100  CO2 25  --  27 25 25   GLUCOSE 291*  --  286* 203* 183*  BUN 29*  --  38* 31* 25*  CREATININE 2.28*  --  2.89* 2.47* 2.19*  CALCIUM 8.5*  --  7.9* 8.3* 8.2*  MG  --  2.1  --  1.5* 2.1   GFR: Estimated Creatinine Clearance: 37.9 mL/min (A) (by C-G formula based on SCr of 2.19 mg/dL (H)). Liver Function Tests: Recent Labs  Lab 02/21/18 1313  AST 30  ALT 26  ALKPHOS 81  BILITOT 1.0  PROT 8.5*  ALBUMIN 4.2   No results for input(s): LIPASE, AMYLASE in the last 168 hours. No results for input(s): AMMONIA in the last 168 hours. Coagulation Profile: No results for input(s): INR, PROTIME in the last 168 hours. Cardiac Enzymes: Recent Labs  Lab 02/21/18 1313 02/22/18 0051 02/22/18 0650 02/22/18 1305  TROPONINI 0.03* 0.06* 0.07* 0.07*   BNP (last 3 results) No results for input(s): PROBNP in the last 8760 hours. HbA1C: Recent Labs    02/21/18 1305  HGBA1C 7.2*   CBG: Recent Labs  Lab 02/22/18 2027 02/23/18 0814 02/23/18 1145 02/23/18 1620 02/23/18 2032  GLUCAP 178* 207* 171* 213* 159*    Lipid Profile: Recent Labs    02/23/18 0435  CHOL 154  HDL 31*  LDLCALC 83  TRIG 161*  CHOLHDL 5.0   Thyroid Function Tests: No results for input(s): TSH, T4TOTAL, FREET4, T3FREE, THYROIDAB in the last 72 hours. Anemia Panel: No results for input(s): VITAMINB12, FOLATE, FERRITIN, TIBC, IRON, RETICCTPCT in the last 72 hours. Sepsis Labs: No results for input(s): PROCALCITON, LATICACIDVEN in the last 168 hours.  Recent Results (from the past 240 hour(s))  MRSA PCR Screening     Status: None   Collection Time: 02/21/18  5:17 PM  Result Value Ref Range Status   MRSA by PCR NEGATIVE NEGATIVE Final    Comment:        The GeneXpert MRSA Assay (FDA approved for NASAL specimens only), is one component of a comprehensive MRSA  colonization surveillance program. It is not intended to diagnose MRSA infection nor to guide or monitor treatment for MRSA infections. Performed at [facility], [address] [place], [place] [address]     Radiology Studies: No results found. Scheduled Meds: . amLODipine  10 mg Oral Daily  . aspirin EC  81 mg Oral Daily  . atorvastatin  40 mg Oral q1800  . cloNIDine  0.1 mg Oral BID  . enoxaparin (LOVENOX) injection  40 mg Subcutaneous Q24H  . hydrALAZINE  75 mg Oral Q8H  . insulin aspart  0-20 Units Subcutaneous TID WC  . insulin aspart  0-5 Units Subcutaneous QHS  . labetalol  400 mg Oral BID  . Living Better with Heart Failure Book   Does not apply Once  . mouth rinse  15 mL Mouth Rinse BID  . potassium chloride  40 mEq Oral Once  . sodium chloride flush  3 mL Intravenous Q12H   Continuous Infusions: . sodium chloride 15 mL/hr at 02/24/18 0602  . nitroGLYCERIN 105 mcg/min (02/24/18 0602)     LOS: 3 days   Critical Care Time spent: 32 mins  Standley Dakins, MD [facility] Pager [phone]  If 7PM-7AM, please contact night-coverage [email or web address] Password [email or web address] 02/24/2018, 6:21 AM

## 2018-02-24 NOTE — Progress Notes (Signed)
Called to room by patient, he was very upset and asking to speak with manager. Explained this undersigned RN was acting as Press photographer also and would try to help as needed.  Pt upset about elink camera in room. Stated that it was not right for the cameras to be video him. Explained cameras where in place to help with pt care but pt continued to say that it was not right that they could look in at him at any time.  Explained I could call [employer] and have them alarm before the camera turns on.  Pt continued with complaints about not sleeping and the alarms on the monitor. Did explain that I would get order for sleeping med and also reset the parameters on the monitor to help with sleep. Pt also upset with someone calling him "bubba" He did not report to this RN who the person was but stated he felt disrespected.  Did inform Ac of patients various complaints and that MD had been notified for sleeping med and [employer] had been called and told not to camera in on patient. AC did speak with patient regarding complaints and assured patient that day shift AC would speak to him regarding staff being disrespectful. Did decrease the frequency of bp monitoring tp 30 minutes to futher facilitate sleep.

## 2018-02-25 LAB — RENAL FUNCTION PANEL
ALBUMIN: 3.3 g/dL — AB (ref 3.5–5.0)
ANION GAP: 10 (ref 5–15)
BUN: 31 mg/dL — AB (ref 8–23)
CALCIUM: 8.3 mg/dL — AB (ref 8.9–10.3)
CO2: 25 mmol/L (ref 22–32)
CREATININE: 2.3 mg/dL — AB (ref 0.61–1.24)
Chloride: 101 mmol/L (ref 98–111)
GFR calc Af Amer: 33 mL/min — ABNORMAL LOW (ref >60)
GFR calc non Af Amer: 29 mL/min — ABNORMAL LOW (ref >60)
GLUCOSE: 150 mg/dL — AB (ref 70–99)
PHOSPHORUS: 4 mg/dL (ref 2.5–4.6)
Potassium: 3.6 mmol/L (ref 3.5–5.1)
SODIUM: 136 mmol/L (ref 135–145)

## 2018-02-25 LAB — C3 COMPLEMENT: C3 Complement: 105 mg/dL (ref 82–167)

## 2018-02-25 LAB — GLUCOSE, CAPILLARY
Glucose-Capillary: 158 mg/dL — ABNORMAL HIGH (ref 70–99)
Glucose-Capillary: 168 mg/dL — ABNORMAL HIGH (ref 70–99)

## 2018-02-25 LAB — HEPATITIS B SURFACE ANTIGEN: Hepatitis B Surface Ag: NEGATIVE

## 2018-02-25 LAB — HEPATITIS C ANTIBODY: HCV Ab: <0.1 s/co ratio (ref 0.0–0.9)

## 2018-02-25 LAB — COMPLEMENT, TOTAL

## 2018-02-25 LAB — MPO/PR-3 (ANCA) ANTIBODIES: ANCA PROTEINASE 3: 5 U/mL — AB (ref 0.0–3.5)

## 2018-02-25 LAB — C4 COMPLEMENT: Complement C4, Body Fluid: 28 mg/dL (ref 14–44)

## 2018-02-25 LAB — ANTISTREPTOLYSIN O TITER: ASO: 22 IU/mL (ref 0.0–200.0)

## 2018-02-25 MED ORDER — CLONIDINE HCL 0.2 MG PO TABS
0.2000 mg | ORAL_TABLET | Freq: Two times a day (BID) | ORAL | Status: DC
  Administered 2018-02-25: 0.2 mg via ORAL
  Filled 2018-02-25: qty 1

## 2018-02-25 MED ORDER — AMLODIPINE BESYLATE 10 MG PO TABS: 10.0000 mg | ORAL_TABLET | Freq: Every day | ORAL | 0 refills | Status: DC

## 2018-02-25 MED ORDER — HYDRALAZINE HCL 50 MG PO TABS: 50.0000 mg | ORAL_TABLET | Freq: Three times a day (TID) | ORAL | 0 refills | Status: DC

## 2018-02-25 MED ORDER — HYDRALAZINE HCL 25 MG PO TABS: 100.0000 mg | ORAL_TABLET | Freq: Three times a day (TID) | ORAL | Status: DC

## 2018-02-25 MED ORDER — CLONIDINE HCL 0.2 MG PO TABS: 0.2000 mg | ORAL_TABLET | Freq: Two times a day (BID) | ORAL | 0 refills | Status: DC

## 2018-02-25 MED ORDER — ATORVASTATIN CALCIUM 40 MG PO TABS: 40.0000 mg | ORAL_TABLET | Freq: Every day | ORAL | 0 refills | Status: DC

## 2018-02-25 MED ORDER — ASPIRIN 81 MG PO TBEC: 81.0000 mg | DELAYED_RELEASE_TABLET | Freq: Every day | ORAL | Status: DC

## 2018-02-25 MED ORDER — LABETALOL HCL 200 MG PO TABS: 400.0000 mg | ORAL_TABLET | Freq: Two times a day (BID) | ORAL | 0 refills | Status: DC

## 2018-02-25 NOTE — Progress Notes (Signed)
Being discharged to home IV access removed. Discharge paperwork given. No request or concerns.

## 2018-02-25 NOTE — Discharge Instructions (Signed)
Chronic Kidney Disease, Adult Chronic kidney disease (CKD) happens when the kidneys are damaged during a time of 3 or more months. The kidneys are two organs that do many important jobs in the body. These jobs include:  Removing wastes and extra fluids from the blood.  Making hormones that maintain the amount of fluid in your tissues and blood vessels.  Making sure that the body has the right amount of fluids and chemicals.  Most of the time, this condition does not go away, but it can usually be controlled. Steps must be taken to slow down the kidney damage or stop it from getting worse. Otherwise, the kidneys may stop working. Follow these instructions at home:  Follow your diet as told by your doctor. You may need to avoid alcohol, salty foods (sodium), and foods that are high in potassium, calcium, and protein.  Take over-the-counter and prescription medicines only as told by your doctor. Do not take any new medicines unless your doctor says you can do that. These include vitamins and minerals. ? Medicines and nutritional supplements can make kidney damage worse. ? Your doctor may need to change how much medicine you take.  Do not use any tobacco products. These include cigarettes, chewing tobacco, and e-cigarettes. If you need help quitting, ask your doctor.  Keep all follow-up visits as told by your doctor. This is important.  Check your blood pressure. Tell your doctor if there are changes to your blood pressure.  Get to a healthy weight. Stay at that weight. If you need help with this, ask your doctor.  Start or continue an exercise plan. Try to exercise at least 30 minutes a day, 5 days a week.  Stay up-to-date with your shots (immunizations) as told by your doctor. Contact a doctor if:  Your symptoms get worse.  You have new symptoms. Get help right away if:  You have symptoms of end-stage kidney disease. These include: ? Headaches. ? Skin that is darker or lighter  than normal. ? Numbness in your hands or feet. ? Easy bruising. ? Having hiccups often. ? Chest pain. ? Shortness of breath. ? Stopping of menstrual periods in women.  You have a fever.  You are making very little pee (urine).  You have pain or bleeding when you pee (urinate). This information is not intended to replace advice given to you by your health care provider. Make sure you discuss any questions you have with your health care provider. Document Released: 08/04/2009 Document Revised: 10/16/2015 Document Reviewed: 01/07/2012 Elsevier Interactive Patient Education  2017 [employer].   Heart Failure Heart failure means your heart has trouble pumping blood. This makes it hard for your body to work well. Heart failure is usually a long-term (chronic) condition. You must take good care of yourself and follow your doctor's treatment plan. Follow these instructions at home:  Take your heart medicine as told by your doctor. ? Do not stop taking medicine unless your doctor tells you to. ? Do not skip any dose of medicine. ? Refill your medicines before they run out. ? Take other medicines only as told by your doctor or pharmacist.  Stay active if told by your doctor. The elderly and people with severe heart failure should talk with a doctor about physical activity.  Eat heart-healthy foods. Choose foods that are without trans fat and are low in saturated fat, cholesterol, and salt (sodium). This includes fresh or frozen fruits and vegetables, fish, lean meats, fat-free or low-fat dairy  foods, whole grains, and high-fiber foods. Lentils and dried peas and beans (legumes) are also good choices.  Limit salt if told by your doctor.  Cook in a healthy way. Roast, grill, broil, bake, poach, steam, or stir-fry foods.  Limit fluids as told by your doctor.  Weigh yourself every morning. Do this after you pee (urinate) and before you eat breakfast. Write down your weight to give to your  doctor.  Take your blood pressure and write it down if your doctor tells you to.  Ask your doctor how to check your pulse. Check your pulse as told.  Lose weight if told by your doctor.  Stop smoking or chewing tobacco. Do not use gum or patches that help you quit without your doctor's approval.  Schedule and go to doctor visits as told.  Nonpregnant women should have no more than 1 drink a day. Men should have no more than 2 drinks a day. Talk to your doctor about drinking alcohol.  Stop illegal drug use.  Stay current with shots (immunizations).  Manage your health conditions as told by your doctor.  Learn to manage your stress.  Rest when you are tired.  If it is really hot outside: ? Avoid intense activities. ? Use air conditioning or fans, or get in a cooler place. ? Avoid caffeine and alcohol. ? Wear loose-fitting, lightweight, and light-colored clothing.  If it is really cold outside: ? Avoid intense activities. ? Layer your clothing. ? Wear mittens or gloves, a hat, and a scarf when going outside. ? Avoid alcohol.  Learn about heart failure and get support as needed.  Get help to maintain or improve your quality of life and your ability to care for yourself as needed. Contact a doctor if:  You gain weight quickly.  You are more short of breath than usual.  You cannot do your normal activities.  You tire easily.  You cough more than normal, especially with activity.  You have any or more puffiness (swelling) in areas such as your hands, feet, ankles, or belly (abdomen).  You cannot sleep because it is hard to breathe.  You feel like your heart is beating fast (palpitations).  You get dizzy or light-headed when you stand up. Get help right away if:  You have trouble breathing.  There is a change in mental status, such as becoming less alert or not being able to focus.  You have chest pain or discomfort.  You faint. This information is not  intended to replace advice given to you by your health care provider. Make sure you discuss any questions you have with your health care provider. Document Released: 02/17/2008 Document Revised: 10/16/2015 Document Reviewed: 06/26/2012 Elsevier Interactive Patient Education  2017 [employer]  Managing Your Hypertension Hypertension is commonly called high blood pressure. This is when the force of your blood pressing against the walls of your arteries is too strong. Arteries are blood vessels that carry blood from your heart throughout your body. Hypertension forces the heart to work harder to pump blood, and may cause the arteries to become narrow or stiff. Having untreated or uncontrolled hypertension can cause heart attack, stroke, kidney disease, and other problems. What are blood pressure readings? A blood pressure reading consists of a higher number over a lower number. Ideally, your blood pressure should be below 120/80. The first ("top") number is called the systolic pressure. It is a measure of the pressure in your arteries as your heart beats. The second ("bottom")  number is called the diastolic pressure. It is a measure of the pressure in your arteries as the heart relaxes. What does my blood pressure reading mean? Blood pressure is classified into four stages. Based on your blood pressure reading, your health care provider may use the following stages to determine what type of treatment you need, if any. Systolic pressure and diastolic pressure are measured in a unit called mm Hg. Normal  Systolic pressure: below 120.  Diastolic pressure: below 80. Elevated  Systolic pressure: 120-129.  Diastolic pressure: below 80. Hypertension stage 1  Systolic pressure: 130-139.  Diastolic pressure: 80-89. Hypertension stage 2  Systolic pressure: 140 or above.  Diastolic pressure: 90 or above. What health risks are associated with hypertension? Managing your hypertension is an  important responsibility. Uncontrolled hypertension can lead to:  A heart attack.  A stroke.  A weakened blood vessel (aneurysm).  Heart failure.  Kidney damage.  Eye damage.  Metabolic syndrome.  Memory and concentration problems.  What changes can I make to manage my hypertension? Hypertension can be managed by making lifestyle changes and possibly by taking medicines. Your health care provider will help you make a plan to bring your blood pressure within a normal range. Eating and drinking  Eat a diet that is high in fiber and potassium, and low in salt (sodium), added sugar, and fat. An example eating plan is called the DASH (Dietary Approaches to Stop Hypertension) diet. To eat this way: ? Eat plenty of fresh fruits and vegetables. Try to fill half of your plate at each meal with fruits and vegetables. ? Eat whole grains, such as whole wheat pasta, brown rice, or whole grain bread. Fill about one quarter of your plate with whole grains. ? Eat low-fat diary products. ? Avoid fatty cuts of meat, processed or cured meats, and poultry with skin. Fill about one quarter of your plate with lean proteins such as fish, chicken without skin, beans, eggs, and tofu. ? Avoid premade and processed foods. These tend to be higher in sodium, added sugar, and fat.  Reduce your daily sodium intake. Most people with hypertension should eat less than 1,500 mg of sodium a day.  Limit alcohol intake to no more than 1 drink a day for nonpregnant women and 2 drinks a day for men. One drink equals 12 oz of beer, 5 oz of wine, or 1 oz of hard liquor. Lifestyle  Work with your health care provider to maintain a healthy body weight, or to lose weight. Ask what an ideal weight is for you.  Get at least 30 minutes of exercise that causes your heart to beat faster (aerobic exercise) most days of the week. Activities may include walking, swimming, or biking.  Include exercise to strengthen your muscles  (resistance exercise), such as weight lifting, as part of your weekly exercise routine. Try to do these types of exercises for 30 minutes at least 3 days a week.  Do not use any products that contain nicotine or tobacco, such as cigarettes and e-cigarettes. If you need help quitting, ask your health care provider.  Control any long-term (chronic) conditions you have, such as high cholesterol or diabetes. Monitoring  Monitor your blood pressure at home as told by your health care provider. Your personal target blood pressure may vary depending on your medical conditions, your age, and other factors.  Have your blood pressure checked regularly, as often as told by your health care provider. Working with your health care provider  Review all the medicines you take with your health care provider because there may be side effects or interactions.  Talk with your health care provider about your diet, exercise habits, and other lifestyle factors that may be contributing to hypertension.  Visit your health care provider regularly. Your health care provider can help you create and adjust your plan for managing hypertension. Will I need medicine to control my blood pressure? Your health care provider may prescribe medicine if lifestyle changes are not enough to get your blood pressure under control, and if:  Your systolic blood pressure is 130 or higher.  Your diastolic blood pressure is 80 or higher.  Take medicines only as told by your health care provider. Follow the directions carefully. Blood pressure medicines must be taken as prescribed. The medicine does not work as well when you skip doses. Skipping doses also puts you at risk for problems. Contact a health care provider if:  You think you are having a reaction to medicines you have taken.  You have repeated (recurrent) headaches.  You feel dizzy.  You have swelling in your ankles.  You have trouble with your vision. Get help right  away if:  You develop a severe headache or confusion.  You have unusual weakness or numbness, or you feel faint.  You have severe pain in your chest or abdomen.  You vomit repeatedly.  You have trouble breathing. Summary  Hypertension is when the force of blood pumping through your arteries is too strong. If this condition is not controlled, it may put you at risk for serious complications.  Your personal target blood pressure may vary depending on your medical conditions, your age, and other factors. For most people, a normal blood pressure is less than 120/80.  Hypertension is managed by lifestyle changes, medicines, or both. Lifestyle changes include weight loss, eating a healthy, low-sodium diet, exercising more, and limiting alcohol. This information is not intended to replace advice given to you by your health care provider. Make sure you discuss any questions you have with your health care provider. Document Released: 02/02/2012 Document Revised: 04/07/2016 Document Reviewed: 04/07/2016 Elsevier Interactive Patient Education  [employer].

## 2018-02-25 NOTE — Progress Notes (Signed)
Subjective: Interval History: has no complaint of nausea or vomiting.  Patient also denies any difficulty breathing.  In general he offers no complaints..  Objective: Vital signs in last 24 hours: Temp:  [98.3 F (36.8 C)-100.3 F (37.9 C)] 99.9 F (37.7 C) (10/05 0400) Pulse Rate:  [74-93] 76 (10/05 0630) Resp:  [2-27] 10 (10/05 0630) BP: (111-178)/(61-129) 161/86 (10/05 0630) SpO2:  [93 %-100 %] 98 % (10/05 0630) Weight:  [82.8 kg] 82.8 kg (10/05 0400) Weight change: 0.5 kg  Intake/Output from previous day: 10/04 0701 - 10/05 0700 In: 961.9 [P.O.:476; I.V.:485.9] Out: 2275 [Urine:2275] Intake/Output this shift: No intake/output data recorded.  General appearance: alert, cooperative and no distress Resp: clear to auscultation bilaterally Cardio: regular rate and rhythm Extremities: No edema  Lab Results: No results for input(s): WBC, HGB, HCT, PLT in the last 72 hours. BMET:  Recent Labs    02/24/18 0414 02/25/18 0433  NA 135 136  K 3.3* 3.6  CL 100 101  CO2 25 25  GLUCOSE 183* 150*  BUN 25* 31*  CREATININE 2.19* 2.30*  CALCIUM 8.2* 8.3*   No results for input(s): PTH in the last 72 hours. Iron Studies: No results for input(s): IRON, TIBC, TRANSFERRIN, FERRITIN in the last 72 hours.  Studies/Results: US Renal  Result Date: 02/24/2018 CLINICAL DATA:  Acute on chronic renal failure. EXAM: RENAL / URINARY TRACT ULTRASOUND COMPLETE COMPARISON:  None. FINDINGS: Right Kidney: Length: 10.9 cm. Echogenicity within normal limits. No mass or hydronephrosis visualized. Left Kidney: Length: 11.7 cm. Echogenicity within normal limits. No mass or hydronephrosis visualized. Bladder: Appears normal for degree of bladder distention. IMPRESSION: Unremarkable renal ultrasound. Electronically Signed   By: Sebastian Ache M.D.   On: 02/24/2018 14:45   Dg Chest Port 1 View  Result Date: 02/24/2018 CLINICAL DATA:  Acute presentation with weakness and dizziness. Hypertension. EXAM:  PORTABLE CHEST 1 VIEW COMPARISON:  02/21/2018 FINDINGS: Cardiomegaly as seen previously. Slightly ectatic aorta. Lungs are clear. The pulmonary vascularity is normal. No infiltrate or effusion. IMPRESSION: No active disease.  Cardiomegaly and ectatic aorta. Electronically Signed   By: Paulina Fusi M.D.   On: 02/24/2018 08:19    I have reviewed the patient's current medications.  Assessment/Plan: 1] renal failure: Seems to be chronic.  Presently his creatinine is 2.3 more or less his baseline.  Patient with chronic stage IV renal failure possibly secondary to diabetes/hypertension.  Presently ANA is negative.  Complement is normal.  Hepatitis B surface antigen and hepatitis C antibody is negative.  Ultrasound of the kidneys did not show any hydronephrosis. 2] hypertension: His blood pressure is high however better controlled. 3] diabetes: His blood sugar is reasonably controlled 4] fluid management: Patient at this moment denies any difficulty breathing.  He had 2200 cc of urine output. 5] bone and mineral disorder: His calcium and phosphorus is range 6] anemia: His hemoglobin is normal. Plan: 1] we will continue his present management 2] we will check his renal panel in the morning.    LOS: 4 days   Demarious Kapur S 02/25/2018,9:12 AM

## 2018-02-25 NOTE — Discharge Summary (Signed)
Physician Discharge Summary  Matthew Robles ZOX:[id number] DOB: Nov 06, 1955 DOA: 02/21/2018  PCP: Patient, No Pcp Per  Admit date: 02/21/2018 Discharge date: 02/25/2018  Admitted From: Home  Disposition: Home (Pt declines SNF)  Recommendations for Outpatient Follow-up:  1. Follow up with [facility] on 10/7 at 3 pm as scheduled 2. Follow up with cardiology and nephrology in 2 weeks.  3. Please obtain BMP/CBC on outpatient follow up   Discharge Condition: STABLE   CODE STATUS: FULL    Brief Hospitalization Summary: Please see all hospital notes, images, labs for full details of the hospitalization.  HPI: Matthew Robles is a 62 y.o. male with medical history significant of hypertension, diabetes, who has not taken any of his medications in approximately a year.  Family says he thought he could manage his medical problems by nonpharmacologic means.  This morning, he woke up with general weakness and had persistent dizziness.  Denies any chest pain.  He has noted that he has had intermittent episodes of orthopnea over the past several months.  For the past 3 weeks, he is noticed worsening lower extremity edema.  Denies any vomiting, abdominal pain.  Says that bowel movements have been relatively normal.  No dysuria.  No fever or cough.  ED Course: He was noted to be severely hypertensive with initial systolic blood pressure of 264/147.  Heart rate was 108.  He was started on nitroglycerin and labetalol infusions.  Chest x-ray indicated decompensated CHF.  He had elevated BNP of 795.  Troponin 0 0.03.  EKG did not show any acute changes.  Creatinine 2.28 which is a chronic finding.  Potassium of 2.5.   Assessment & Plan:   Active Problems:   DM (diabetes mellitus), type 2 with neurological complications (HCC)   Hypokalemia   Hypertensive emergency   Congestive heart failure   Vomiting   CKD (chronic kidney disease) stage 3, GFR 30-59 ml/min (HCC)   Noncompliance with  medications  1. Hypertensive emergency.   Pt off IV nitroglycerin infusion. He is been started on Norvasc, labetalol, clonidine, hydralazine. BP remains elevated but much better controlled now.  He says that he will take his medications when discharged.  He was given [employer] voucher for assistance with meds.   Given his CKD 2. Acute diastolic congestive heart failure secondary to hypertensive emergency.  Echo shows preserved EF with grade 2 diastolic dysfunction.  He did present with shortness of breath.  He was started on IV Lasix and his shortness of breath is better. Lasix has been discontinued due to bump in creatinine.  Cardiology following.  Repeat CXR shows resolution of pulmonary edema.  3. Hypokalemia - Repleted.  Magnesium has been repleted.   4. AKI on CKD stage III.  Mild improvement in creatinine.  I have asked for renal consultation.   Continue to monitor renal function on outpatient basis.   5. Diabetes type 2 with renal complications -  A1c is 7.2.  He is currently diet controlled at home.  6. Nausea/Vomiting.  Resolved.  Unclear etiology.   Continue solid diet as tolerated.  Abdomen otherwise feels benign.  LFTs unremarkable.     DVT prophylaxis: Lovenox Code Status: Full code Family Communication: No family present Disposition Plan: Discharge home as patient declines SNF and will go home with mother and family to care for him 24/7.    Consultants:   Cardiology  Nephrology  Procedures:  Echo: - Left ventricle: The cavity size was normal. Wall thickness was increased in a  pattern of moderate LVH. Systolic function was normal. The estimated ejection fraction was in the range of 50% to 55%. Wall motion was normal; there were no regional wall motion abnormalities. Features are consistent with a pseudonormal left ventricular filling pattern, with concomitant abnormalrelaxation and increased filling pressure (grade 2 diastolic dysfunction). Doppler parameters  are consistent with highventricular filling pressure. - Aortic valve: Mildly calcified annulus. Trileaflet; mildlythickened leaflets. Valve area (VTI): 2.17 cm^2. Valve area (Vmax): 2.28 cm^2. - Left atrium: The atrium was severely dilated. - Right ventricle: The cavity size was mildly dilated. Systolicfunction was mildly reduced. - Right atrium: The atrium was severely dilated.  - Technically adequate study.  Discharge Diagnoses:  Active Problems:   DM (diabetes mellitus), type 2 with neurological complications (HCC)   Hypokalemia   Hypertensive emergency   Congestive heart failure (HCC)   Vomiting   CKD (chronic kidney disease) stage 3, GFR 30-59 ml/min (HCC)   Noncompliance with medications  Discharge Instructions: Discharge Instructions    (HEART FAILURE PATIENTS) Call MD:  Anytime you have any of the following symptoms: 1) 3 pound weight gain in 24 hours or 5 pounds in 1 week 2) shortness of breath, with or without a dry hacking cough 3) swelling in the hands, feet or stomach 4) if you have to sleep on extra pillows at night in order to breathe.   Complete by:  As directed    Call MD for:  difficulty breathing, headache or visual disturbances   Complete by:  As directed    Call MD for:  extreme fatigue   Complete by:  As directed    Call MD for:  persistant dizziness or light-headedness   Complete by:  As directed    Call MD for:  persistant nausea and vomiting   Complete by:  As directed    Diet - low sodium heart healthy   Complete by:  As directed    Increase activity slowly   Complete by:  As directed      Allergies as of 02/25/2018   No Known Allergies     Medication List    TAKE these medications   amLODipine 10 MG tablet Commonly known as:  NORVASC Take 1 tablet (10 mg total) by mouth daily. Start taking on:  02/26/2018   aspirin 81 MG EC tablet Take 1 tablet (81 mg total) by mouth daily. Start taking on:  02/26/2018   atorvastatin 40 MG  tablet Commonly known as:  LIPITOR Take 1 tablet (40 mg total) by mouth daily at 6 PM.   cloNIDine 0.2 MG tablet Commonly known as:  CATAPRES Take 1 tablet (0.2 mg total) by mouth 2 (two) times daily.   hydrALAZINE 50 MG tablet Commonly known as:  APRESOLINE Take 1 tablet (50 mg total) by mouth every 8 (eight) hours.   labetalol 200 MG tablet Commonly known as:  NORMODYNE Take 2 tablets (400 mg total) by mouth 2 (two) times daily.      Follow-up Information    [facility] Follow up.   Why:  Appt on 02/27/2018 at 3 pm Contact information: [address] [place] [address] [address] [phone]       Salomon Mast, MD. Schedule an appointment as soon as possible for a visit in 2 week(s).   Specialty:  Nephrology Why:  Hospital Follow Up CKD Contact information: [address] [place] [address] [phone]        Antoine Poche, MD. Schedule an appointment  as soon as possible for a visit in 2 week(s).   Specialty:  Cardiology Why:  Hospital  follow Up CHF Contact information: [address] [place] [address] [phone]          No Known Allergies Allergies as of 02/25/2018   No Known Allergies     Medication List    TAKE these medications   amLODipine 10 MG tablet Commonly known as:  NORVASC Take 1 tablet (10 mg total) by mouth daily. Start taking on:  02/26/2018   aspirin 81 MG EC tablet Take 1 tablet (81 mg total) by mouth daily. Start taking on:  02/26/2018   atorvastatin 40 MG tablet Commonly known as:  LIPITOR Take 1 tablet (40 mg total) by mouth daily at 6 PM.   cloNIDine 0.2 MG tablet Commonly known as:  CATAPRES Take 1 tablet (0.2 mg total) by mouth 2 (two) times daily.   hydrALAZINE 50 MG tablet Commonly known as:  APRESOLINE Take 1 tablet (50 mg total) by mouth every 8 (eight) hours.   labetalol 200 MG tablet Commonly known as:  NORMODYNE Take 2 tablets (400 mg total) by mouth 2 (two) times  daily.       Procedures/Studies: US Renal  Result Date: 02/24/2018 CLINICAL DATA:  Acute on chronic renal failure. EXAM: RENAL / URINARY TRACT ULTRASOUND COMPLETE COMPARISON:  None. FINDINGS: Right Kidney: Length: 10.9 cm. Echogenicity within normal limits. No mass or hydronephrosis visualized. Left Kidney: Length: 11.7 cm. Echogenicity within normal limits. No mass or hydronephrosis visualized. Bladder: Appears normal for degree of bladder distention. IMPRESSION: Unremarkable renal ultrasound. Electronically Signed   By: Sebastian Ache M.D.   On: 02/24/2018 14:45   Dg Chest Port 1 View  Result Date: 02/24/2018 CLINICAL DATA:  Acute presentation with weakness and dizziness. Hypertension. EXAM: PORTABLE CHEST 1 VIEW COMPARISON:  02/21/2018 FINDINGS: Cardiomegaly as seen previously. Slightly ectatic aorta. Lungs are clear. The pulmonary vascularity is normal. No infiltrate or effusion. IMPRESSION: No active disease.  Cardiomegaly and ectatic aorta. Electronically Signed   By: Paulina Fusi M.D.   On: 02/24/2018 08:19   Dg Chest Port 1 View  Result Date: 02/21/2018 CLINICAL DATA:  Initial evaluation for acute shortness of breath. EXAM: PORTABLE CHEST 1 VIEW COMPARISON:  Prior radiograph from 01/15/2016. FINDINGS: Cardiomegaly, stable from previous.  Mediastinal silhouette normal. Lungs normally inflated. Diffuse vascular congestion with interstitial prominence, consistent with pulmonary interstitial edema. No pleural effusion. No consolidative airspace disease. No pneumothorax. Osseous structures within normal limits. IMPRESSION: Cardiomegaly with mild diffuse pulmonary interstitial edema. Electronically Signed   By: Rise Mu M.D.   On: 02/21/2018 13:26      Subjective:   Discharge Exam: Vitals:   02/25/18 0630 02/25/18 0800  BP: (!) 161/86   Pulse: 76   Resp: 10   Temp:  97.9 F (36.6 C)  SpO2: 98%    Vitals:   02/25/18 0530 02/25/18 0600 02/25/18 0630 02/25/18 0800  BP: (!)  161/105 (!) 158/92 (!) 161/86   Pulse: 80 76 76   Resp: 12 (!) 2 10   Temp:    97.9 F (36.6 C)  TempSrc:    Axillary  SpO2: 98% 97% 98%   Weight:      Height:        General: Pt is alert, awake, not in acute distress Cardiovascular: RRR, S1/S2 +, no rubs, no gallops Respiratory: CTA bilaterally, no wheezing, no rhonchi Abdominal: Soft, NT, ND, bowel sounds + Extremities: no edema,  no cyanosis   The results of significant diagnostics from this hospitalization (including imaging, microbiology, ancillary and laboratory) are listed below for reference.     Microbiology: Recent Results (from the past 240 hour(s))  MRSA PCR Screening     Status: None   Collection Time: 02/21/18  5:17 PM  Result Value Ref Range Status   MRSA by PCR NEGATIVE NEGATIVE Final    Comment:        The GeneXpert MRSA Assay (FDA approved for NASAL specimens only), is one component of a comprehensive MRSA colonization surveillance program. It is not intended to diagnose MRSA infection nor to guide or monitor treatment for MRSA infections. Performed at [facility], [address] [place], [place] [address]      Labs: BNP (last 3 results) Recent Labs    02/21/18 1307  BNP 795.0*   Basic Metabolic Panel: Recent Labs  Lab 02/21/18 1313 02/21/18 1556 02/22/18 0650 02/23/18 0435 02/24/18 0414 02/25/18 0433  NA 140  --  138 139 135 136  K 2.5*  --  3.3* 2.7* 3.3* 3.6  CL 101  --  98 100 100 101  CO2 25  --  27 25 25 25   GLUCOSE 291*  --  286* 203* 183* 150*  BUN 29*  --  38* 31* 25* 31*  CREATININE 2.28*  --  2.89* 2.47* 2.19* 2.30*  CALCIUM 8.5*  --  7.9* 8.3* 8.2* 8.3*  MG  --  2.1  --  1.5* 2.1  --   PHOS  --   --   --   --   --  4.0   Liver Function Tests: Recent Labs  Lab 02/21/18 1313 02/25/18 0433  AST 30  --   ALT 26  --   ALKPHOS 81  --   BILITOT 1.0  --   PROT 8.5*  --   ALBUMIN 4.2 3.3*   No results for input(s): LIPASE, AMYLASE in the last 168 hours. No results  for input(s): AMMONIA in the last 168 hours. CBC: Recent Labs  Lab 02/21/18 1313  WBC 19.4*  NEUTROABS 16.2*  HGB 12.7*  HCT 37.8*  MCV 93.8  PLT 333   Cardiac Enzymes: Recent Labs  Lab 02/21/18 1313 02/22/18 0051 02/22/18 0650 02/22/18 1305  TROPONINI 0.03* 0.06* 0.07* 0.07*   BNP: Invalid input(s): POCBNP CBG: Recent Labs  Lab 02/24/18 0730 02/24/18 1132 02/24/18 1634 02/24/18 2108 02/25/18 0815  GLUCAP 176* 161* 158* 261* 158*   D-Dimer No results for input(s): DDIMER in the last 72 hours. Hgb A1c No results for input(s): HGBA1C in the last 72 hours. Lipid Profile Recent Labs    02/23/18 0435  CHOL 154  HDL 31*  LDLCALC 83  TRIG 191*  CHOLHDL 5.0   Thyroid function studies No results for input(s): TSH, T4TOTAL, T3FREE, THYROIDAB in the last 72 hours.  Invalid input(s): FREET3 Anemia work up No results for input(s): VITAMINB12, FOLATE, FERRITIN, TIBC, IRON, RETICCTPCT in the last 72 hours. Urinalysis    Component Value Date/Time   COLORURINE STRAW (A) 02/21/2018 1307   APPEARANCEUR CLEAR 02/21/2018 1307   LABSPEC 1.008 02/21/2018 1307   PHURINE 6.0 02/21/2018 1307   GLUCOSEU >=500 (A) 02/21/2018 1307   HGBUR NEGATIVE 02/21/2018 1307   BILIRUBINUR NEGATIVE 02/21/2018 1307   KETONESUR NEGATIVE 02/21/2018 1307   PROTEINUR 100 (A) 02/21/2018 1307   NITRITE NEGATIVE 02/21/2018 1307   LEUKOCYTESUR NEGATIVE 02/21/2018 1307   Sepsis Labs Invalid input(s): PROCALCITONIN,  WBC,  LACTICIDVEN Microbiology Recent Results (from the past 240 hour(s))  MRSA PCR Screening     Status: None   Collection Time: 02/21/18  5:17 PM  Result Value Ref Range Status   MRSA by PCR NEGATIVE NEGATIVE Final    Comment:        The GeneXpert MRSA Assay (FDA approved for NASAL specimens only), is one component of a comprehensive MRSA colonization surveillance program. It is not intended to diagnose MRSA infection nor to guide or monitor treatment for MRSA  infections. Performed at [facility], [address], [place], [place] [address]    Time coordinating discharge: 34 mins  SIGNED:  Standley Dakins, MD  [facility] 02/25/2018, 11:27 AM Pager [phone]  If 7PM-7AM, please contact night-coverage [email or web address] Password [email or web address]

## 2018-02-27 LAB — ANTINUCLEAR ANTIBODIES, IFA: ANA Ab, IFA: NEGATIVE

## 2018-03-15 ENCOUNTER — Encounter: Payer: Self-pay | Admitting: Physician Assistant

## 2018-03-15 ENCOUNTER — Other Ambulatory Visit (HOSPITAL_COMMUNITY)
Admission: RE | Admit: 2018-03-15 | Discharge: 2018-03-15 | Disposition: A | Discharge status: Home / Self Care | Payer: Self-pay | Source: Ambulatory Visit | Attending: Physician Assistant | Admitting: Physician Assistant

## 2018-03-15 ENCOUNTER — Ambulatory Visit: Payer: Self-pay | Admitting: Physician Assistant

## 2018-03-15 VITALS — BP 184/104 | HR 81 | Temp 97.9°F | Ht 70.0 in | Wt 179.5 lb

## 2018-03-15 DIAGNOSIS — I509 Heart failure, unspecified: Secondary | ICD-10-CM

## 2018-03-15 DIAGNOSIS — Z8673 Personal history of transient ischemic attack (TIA), and cerebral infarction without residual deficits: Secondary | ICD-10-CM

## 2018-03-15 DIAGNOSIS — N189 Chronic kidney disease, unspecified: Secondary | ICD-10-CM

## 2018-03-15 DIAGNOSIS — E1169 Type 2 diabetes mellitus with other specified complication: Secondary | ICD-10-CM

## 2018-03-15 DIAGNOSIS — I10 Essential (primary) hypertension: Secondary | ICD-10-CM

## 2018-03-15 LAB — BASIC METABOLIC PANEL
Anion gap: 8 (ref 5–15)
BUN: 28 mg/dL — AB (ref 8–23)
CO2: 28 mmol/L (ref 22–32)
CREATININE: 2.27 mg/dL — AB (ref 0.61–1.24)
Calcium: 9.2 mg/dL (ref 8.9–10.3)
Chloride: 100 mmol/L (ref 98–111)
GFR calc Af Amer: 34 mL/min — ABNORMAL LOW (ref >60)
GFR, EST NON AFRICAN AMERICAN: 29 mL/min — AB (ref >60)
Glucose, Bld: 160 mg/dL — ABNORMAL HIGH (ref 70–99)
Potassium: 3.5 mmol/L (ref 3.5–5.1)
SODIUM: 136 mmol/L (ref 135–145)

## 2018-03-15 LAB — CBC
HCT: 40.8 % (ref 39.0–52.0)
Hemoglobin: 13.1 g/dL (ref 13.0–17.0)
MCH: 29.2 pg (ref 26.0–34.0)
MCHC: 32.1 g/dL (ref 30.0–36.0)
MCV: 90.9 fL (ref 80.0–100.0)
PLATELETS: 263 10*3/uL (ref 150–400)
RBC: 4.49 MIL/uL (ref 4.22–5.81)
RDW: 12.9 % (ref 11.5–15.5)
WBC: 7.2 10*3/uL (ref 4.0–10.5)
nRBC: 0 % (ref 0.0–0.2)

## 2018-03-15 MED ORDER — CLONIDINE HCL 0.1 MG PO TABS
0.1000 mg | ORAL_TABLET | Freq: Once | ORAL | Status: AC
  Administered 2018-03-15: 0.1 mg via ORAL

## 2018-03-15 MED ORDER — CLONIDINE HCL 0.2 MG PO TABS: 0.2000 mg | ORAL_TABLET | Freq: Three times a day (TID) | ORAL | 0 refills | Status: DC

## 2018-03-15 MED ORDER — AMLODIPINE BESYLATE 10 MG PO TABS: 10.0000 mg | ORAL_TABLET | Freq: Every day | ORAL | 0 refills | Status: DC

## 2018-03-15 MED ORDER — HYDRALAZINE HCL 50 MG PO TABS: 50.0000 mg | ORAL_TABLET | Freq: Three times a day (TID) | ORAL | 0 refills | Status: DC

## 2018-03-15 MED ORDER — ATORVASTATIN CALCIUM 40 MG PO TABS: 40.0000 mg | ORAL_TABLET | Freq: Every day | ORAL | 0 refills | Status: DC

## 2018-03-15 MED ORDER — LABETALOL HCL 200 MG PO TABS: 400.0000 mg | ORAL_TABLET | Freq: Two times a day (BID) | ORAL | 0 refills | Status: DC
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
## 2018-04-12 ENCOUNTER — Ambulatory Visit: Payer: Self-pay | Admitting: Physician Assistant

## 2018-04-12 ENCOUNTER — Encounter: Payer: Self-pay | Admitting: Physician Assistant

## 2018-04-12 VITALS — BP 164/94 | HR 79 | Temp 97.3°F | Ht 70.0 in | Wt 183.5 lb

## 2018-04-12 DIAGNOSIS — N189 Chronic kidney disease, unspecified: Secondary | ICD-10-CM

## 2018-04-12 DIAGNOSIS — I509 Heart failure, unspecified: Secondary | ICD-10-CM

## 2018-04-12 DIAGNOSIS — E1169 Type 2 diabetes mellitus with other specified complication: Secondary | ICD-10-CM

## 2018-04-12 DIAGNOSIS — Z9114 Patient's other noncompliance with medication regimen: Secondary | ICD-10-CM

## 2018-04-12 DIAGNOSIS — Z8673 Personal history of transient ischemic attack (TIA), and cerebral infarction without residual deficits: Secondary | ICD-10-CM

## 2018-04-12 DIAGNOSIS — I10 Essential (primary) hypertension: Secondary | ICD-10-CM

## 2018-04-12 NOTE — Progress Notes (Signed)
BP (!) 164/94 (BP Location: Right Arm, Patient Position: Sitting, Cuff Size: Normal)   Pulse 79   Temp (!) 97.3 F (36.3 C)   Ht 5\' 10"  (1.778 m)   Wt 183 lb 8 oz (83.2 kg)   SpO2 98%   BMI 26.33 kg/m    Subjective:    Patient ID: Matthew Robles, male    DOB: 03-Nov-1955, 62 y.o.   MRN: [id number]  HPI: Kodey Xue is a 62 y.o. male presenting on 04/12/2018 for Follow-up   HPI   Pt has appointment with nephrology 05/02/18  he thinks.  He still needs cardiology appointment.  Pt ran out of "that pill that he takes 2 of twice daily"-  He missed it last night and this morning (2 doses).  He also has questions about his meds but he didn't bring them and doesn't have any idea what anything is.    Sometimes when he walks he feels a bit unsteady.  Feels wobbly but not dizziness, per his description  He says he is following diabetic diet.  He declined medication at previous appointment for treatment of diabetes.   He has not yet turned in his [facility] application  Relevant past medical, surgical, family and social history reviewed and updated as indicated. Interim medical history since our last visit reviewed. Allergies and medications reviewed and updated.    Current Outpatient Medications:  .  amLODipine (NORVASC) 10 MG tablet, Take 1 tablet (10 mg total) by mouth daily., Disp: 90 tablet, Rfl: 0 .  aspirin EC 81 MG EC tablet, Take 1 tablet (81 mg total) by mouth daily., Disp: , Rfl:  .  atorvastatin (LIPITOR) 40 MG tablet, Take 1 tablet (40 mg total) by mouth daily at 6 PM., Disp: 90 tablet, Rfl: 0 .  cloNIDine (CATAPRES) 0.2 MG tablet, Take 1 tablet (0.2 mg total) by mouth 3 (three) times daily., Disp: 270 tablet, Rfl: 0 .  hydrALAZINE (APRESOLINE) 50 MG tablet, Take 1 tablet (50 mg total) by mouth every 8 (eight) hours., Disp: 270 tablet, Rfl: 0 .  labetalol (NORMODYNE) 200 MG tablet, Take 2 tablets (400 mg total) by mouth 2 (two) times daily. (Patient not taking:  Reported on 04/12/2018), Disp: 120 tablet, Rfl: 0    Review of Systems  Constitutional: Positive for appetite change. Negative for chills, diaphoresis, fatigue, fever and unexpected weight change.  HENT: Positive for dental problem. Negative for congestion, drooling, ear pain, facial swelling, hearing loss, mouth sores, sneezing, sore throat, trouble swallowing and voice change.   Eyes: Negative for pain, discharge, redness, itching and visual disturbance.  Respiratory: Negative for cough, choking, shortness of breath and wheezing.   Cardiovascular: Negative for chest pain, palpitations and leg swelling.  Gastrointestinal: Positive for constipation. Negative for abdominal pain, blood in stool, diarrhea and vomiting.  Endocrine: Negative for cold intolerance, heat intolerance and polydipsia.  Genitourinary: Negative for decreased urine volume, dysuria and hematuria.  Musculoskeletal: Positive for gait problem. Negative for arthralgias and back pain.  Skin: Negative for rash.  Allergic/Immunologic: Positive for environmental allergies.  Neurological: Positive for light-headedness. Negative for seizures, syncope and headaches.  Hematological: Negative for adenopathy.  Psychiatric/Behavioral: Negative for agitation, dysphoric mood and suicidal ideas. The patient is not nervous/anxious.     Per HPI unless specifically indicated above     Objective:    BP (!) 164/94 (BP Location: Right Arm, Patient Position: Sitting, Cuff Size: Normal)   Pulse 79   Temp (!) 97.3 F (36.3  C)   Ht 5\' 10"  (1.778 m)   Wt 183 lb 8 oz (83.2 kg)   SpO2 98%   BMI 26.33 kg/m   Wt Readings from Last 3 Encounters:  04/12/18 183 lb 8 oz (83.2 kg)  03/15/18 179 lb 8 oz (81.4 kg)  02/25/18 182 lb 8.7 oz (82.8 kg)    Physical Exam  Constitutional: He is oriented to person, place, and time. He appears well-developed and well-nourished.  HENT:  Head: Normocephalic and atraumatic.  Neck: Neck supple.   Cardiovascular: Normal rate and regular rhythm.  Pulmonary/Chest: Effort normal and breath sounds normal. He has no wheezes.  Abdominal: Soft. Bowel sounds are normal. There is no hepatosplenomegaly. There is no tenderness.  Musculoskeletal: He exhibits no edema.  Lymphadenopathy:    He has no cervical adenopathy.  Neurological: He is alert and oriented to person, place, and time.  Skin: Skin is warm and dry.  Psychiatric: He has a normal mood and affect. His behavior is normal.  Vitals reviewed.   Results for orders placed or performed during the hospital encounter of 03/15/18  CBC  Result Value Ref Range   WBC 7.2 4.0 - 10.5 K/uL   RBC 4.49 4.22 - 5.81 MIL/uL   Hemoglobin 13.1 13.0 - 17.0 g/dL   HCT 16.140.8 09.639.0 - 04.552.0 %   MCV 90.9 80.0 - 100.0 fL   MCH 29.2 26.0 - 34.0 pg   MCHC 32.1 30.0 - 36.0 g/dL   RDW 40.912.9 81.111.5 - 91.415.5 %   Platelets 263 150 - 400 K/uL   nRBC 0.0 0.0 - 0.2 %  Basic metabolic panel  Result Value Ref Range   Sodium 136 135 - 145 mmol/L   Potassium 3.5 3.5 - 5.1 mmol/L   Chloride 100 98 - 111 mmol/L   CO2 28 22 - 32 mmol/L   Glucose, Bld 160 (H) 70 - 99 mg/dL   BUN 28 (H) 8 - 23 mg/dL   Creatinine, Ser 7.822.27 (H) 0.61 - 1.24 mg/dL   Calcium 9.2 8.9 - 95.610.3 mg/dL   GFR calc non Af Amer 29 (L) >60 mL/min   GFR calc Af Amer 34 (L) >60 mL/min   Anion gap 8 5 - 15      Assessment & Plan:   Encounter Diagnoses  Name Primary?  . Hypertension, unspecified type Yes  . Chronic kidney disease, unspecified CKD stage   . Congestive heart failure, unspecified HF chronicity, unspecified heart failure type (HCC)   . Type 2 diabetes mellitus with other specified complication, without long-term current use of insulin (HCC)   . History of cerebellar stroke   . Noncompliance with medications     -reviewed labs with pt -pt counseled to bring all meds to every appointment -pt counseled to avoid running out of his medications -will re-enter referral to cardiology -pt  to nephrology per their recomendation -pt counseled to get [facility] turned in  -pt to dental list -pt to follow up 6 weeks.  RTO sooner prn
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
## 2018-06-02 ENCOUNTER — Encounter: Payer: Self-pay | Admitting: Cardiology

## 2018-06-02 ENCOUNTER — Other Ambulatory Visit: Payer: Self-pay | Admitting: Physician Assistant

## 2018-06-02 ENCOUNTER — Ambulatory Visit (INDEPENDENT_AMBULATORY_CARE_PROVIDER_SITE_OTHER): Payer: Self-pay | Admitting: Cardiology

## 2018-06-02 VITALS — BP 184/106 | HR 92 | Ht 71.0 in | Wt 182.0 lb

## 2018-06-02 DIAGNOSIS — I119 Hypertensive heart disease without heart failure: Secondary | ICD-10-CM

## 2018-06-02 DIAGNOSIS — I5032 Chronic diastolic (congestive) heart failure: Secondary | ICD-10-CM

## 2018-06-02 DIAGNOSIS — I10 Essential (primary) hypertension: Secondary | ICD-10-CM

## 2018-06-02 MED ORDER — HYDRALAZINE HCL 100 MG PO TABS: 100.0000 mg | ORAL_TABLET | Freq: Three times a day (TID) | ORAL | 3 refills | Status: DC

## 2018-06-02 MED ORDER — CLONIDINE HCL 0.2 MG PO TABS: 0.2000 mg | ORAL_TABLET | Freq: Three times a day (TID) | ORAL | 3 refills | Status: DC

## 2018-06-02 NOTE — Patient Instructions (Signed)
Medication Instructions:  DECREASE CLONIDINE TO 0.2 MG - THREE TIMES DAILY  INCREASE HTDRALAZINE TO 100 MG - THREE TIMERS DAILY   Labwork: TODAY   Testing/Procedures: Your physician has requested that you have a renal artery duplex. During this test, an ultrasound is used to evaluate blood flow to the kidneys. Allow one hour for this exam. Do not eat after midnight the day before and avoid carbonated beverages. Take your medications as you usually do.   Follow-Up: Your physician recommends that you schedule a follow-up appointment in: 1 WEEK NURSE APPOINTMENT - PLEASE BRING MEDICATIONS & BLOOD PRESSURE CUFF TO COMPARE   Your physician recommends that you schedule a follow-up appointment in: 2 MONTHS WITH DR. BRANCH     Any Other Special Instructions Will Be Listed Below (If Applicable).     If you need a refill on your cardiac medications before your next appointment, please call your pharmacy.

## 2018-06-02 NOTE — Progress Notes (Signed)
Clinical Summary [name] is a 63 y.o.male seen as new consult   1. Chronic diastolic HF - echo 02/2018 LVEF 50-55%, grade II diastolic dysfunction, mild RV dysfunction - no recent SOB, no LE edema   2. HTN - admission 02/2018 with HTN emergency and SBP in 260s, had not been taking his home meds.  - presented with pulmonary edema. Mild trop elevation in setting of severe HTN and CHF.  - compliant with meds. - checks at home multiple times a day. Usually 120s-140s/60-80s, can be as high as 170s. He is unsure of the accuracy of his cuff    3. CKD 3 - followed by pcp, upcoming appt with renal   4. History of CVA Past Medical History:  Diagnosis Date  . Diabetes mellitus without complication (HCC)   . Hypertension   . Stroke Matthew Robles(HCC)    2017     No Known Allergies   Current Outpatient Medications  Medication Sig Dispense Refill  . amLODipine (NORVASC) 10 MG tablet Take 1 tablet (10 mg total) by mouth daily. 90 tablet 0  . aspirin EC 81 MG EC tablet Take 1 tablet (81 mg total) by mouth daily.    Marland Kitchen. atorvastatin (LIPITOR) 40 MG tablet Take 1 tablet (40 mg total) by mouth daily at 6 PM. 90 tablet 0  . cloNIDine (CATAPRES) 0.2 MG tablet Take 1 tablet (0.2 mg total) by mouth 3 (three) times daily. 270 tablet 0  . hydrALAZINE (APRESOLINE) 50 MG tablet Take 1 tablet (50 mg total) by mouth every 8 (eight) hours. 270 tablet 0  . labetalol (NORMODYNE) 200 MG tablet Take 2 tablets (400 mg total) by mouth 2 (two) times daily. (Patient not taking: Reported on 04/12/2018) 120 tablet 0   No current facility-administered medications for this visit.      No past surgical history on file.   No Known Allergies    Family History  Problem Relation Age of Onset  . Diabetes Mother   . Hypertension Mother   . Diabetes Maternal Grandfather      Social History [name] reports that he quit smoking about 8 months ago. His smoking use included cigarettes. He smoked 1.00  pack per day. He has never used smokeless tobacco. [name] reports previous alcohol use.   Review of Systems CONSTITUTIONAL: No weight loss, fever, chills, weakness or fatigue.  HEENT: Eyes: No visual loss, blurred vision, double vision or yellow sclerae.No hearing loss, sneezing, congestion, runny nose or sore throat.  SKIN: No rash or itching.  CARDIOVASCULAR: no chest pain, no palpitations.  RESPIRATORY: No shortness of breath, cough or sputum.  GASTROINTESTINAL: No anorexia, nausea, vomiting or diarrhea. No abdominal pain or blood.  GENITOURINARY: No burning on urination, no polyuria NEUROLOGICAL: No headache, dizziness, syncope, paralysis, ataxia, numbness or tingling in the extremities. No change in bowel or bladder control.  MUSCULOSKELETAL: No muscle, back pain, joint pain or stiffness.  LYMPHATICS: No enlarged nodes. No history of splenectomy.  PSYCHIATRIC: No history of depression or anxiety.  ENDOCRINOLOGIC: No reports of sweating, cold or heat intolerance. No polyuria or polydipsia.  Marland Kitchen.   Physical Examination Vitals:   06/02/18 0926 06/02/18 0931  BP: (!) 182/92 (!) 184/106  Pulse: 92   SpO2: 98%    Vitals:   06/02/18 0926  Weight: 182 lb (82.6 kg)  Height: 5\' 11"  (1.803 m)    Gen: resting comfortably, no acute distress HEENT: no scleral icterus, pupils equal round and reactive, no  palptable cervical adenopathy,  CV: RRR, no m/r/g, no jvd Resp: Clear to auscultation bilaterally GI: abdomen is soft, non-tender, non-distended, normal bowel sounds, no hepatosplenomegaly MSK: extremities are warm, no edema.  Skin: warm, no rash Neuro:  no focal deficits Psych: appropriate affect   Diagnostic Studies  02/2018 echo Study Conclusions  - Left ventricle: The cavity size was normal. Wall thickness was   increased in a pattern of moderate LVH. Systolic function was   normal. The estimated ejection fraction was in the range of 50%   to 55%. Wall motion was  normal; there were no regional wall   motion abnormalities. Features are consistent with a pseudonormal   left ventricular filling pattern, with concomitant abnormal   relaxation and increased filling pressure (grade 2 diastolic   dysfunction). Doppler parameters are consistent with high   ventricular filling pressure. - Aortic valve: Mildly calcified annulus. Trileaflet; mildly   thickened leaflets. Valve area (VTI): 2.17 cm^2. Valve area   (Vmax): 2.28 cm^2. - Left atrium: The atrium was severely dilated. - Right ventricle: The cavity size was mildly dilated. Systolic   function was mildly reduced. - Right atrium: The atrium was severely dilated. - Technically adequate study.    Assessment and Plan  1. HTN - severe HTN. Several year history of medication noncompliance has led to significant end organ damage with kidney dysfunction and hypertensive heart disease with diastolic dysfunction - decrease clonidine to 0.2mg  tid, increase hydralazine to 100mg  tid. Would titrate labetolol next if needed, possibly loop diuretic in near furutre.  - check aldo/renin levels and ratio, TSH, renal artery [place]. Pending initial workup consider OSA testing.  - nursing visit 1 week for bp check. He is to bring his bp cuffs to compare   2. Chronic diastolic HF/hypertensive heart disease - euvolemic, work on bp control. Not requiring diuretic.  - EKG today shows SR, LVH, chronic ST/T changes   3. CKD III - avoid nephrotoxic drugs - f/u with renal  Nursing visit 1 week for vitals check and compare his home bp cuffs, verify his pill bottles. F/u with me 2 months    Antoine Poche, M.D.

## 2018-06-12 ENCOUNTER — Ambulatory Visit: Payer: Self-pay | Admitting: Physician Assistant

## 2018-06-12 ENCOUNTER — Encounter: Payer: Self-pay | Admitting: Physician Assistant

## 2018-06-12 VITALS — BP 194/95 | HR 79 | Temp 97.9°F | Wt 185.0 lb

## 2018-06-12 DIAGNOSIS — N189 Chronic kidney disease, unspecified: Secondary | ICD-10-CM

## 2018-06-12 DIAGNOSIS — E1169 Type 2 diabetes mellitus with other specified complication: Secondary | ICD-10-CM

## 2018-06-12 DIAGNOSIS — Z9114 Patient's other noncompliance with medication regimen: Secondary | ICD-10-CM

## 2018-06-12 DIAGNOSIS — Z91148 Patient's other noncompliance with medication regimen for other reason: Secondary | ICD-10-CM

## 2018-06-12 DIAGNOSIS — I10 Essential (primary) hypertension: Secondary | ICD-10-CM

## 2018-06-12 DIAGNOSIS — I509 Heart failure, unspecified: Secondary | ICD-10-CM

## 2018-06-12 MED ORDER — LABETALOL HCL 200 MG PO TABS: 400.0000 mg | ORAL_TABLET | Freq: Two times a day (BID) | ORAL | 1 refills | Status: DC

## 2018-06-12 NOTE — Patient Instructions (Signed)

## 2018-06-12 NOTE — Progress Notes (Signed)
BP (!) 194/95 (BP Location: Left Arm, Patient Position: Sitting, Cuff Size: Normal)   Pulse 79   Temp 97.9 F (36.6 C)   Wt 185 lb (83.9 kg)   SpO2 96%   BMI 25.80 kg/m    Subjective:    Patient ID: Matthew Robles, male    DOB: 1955-08-06, 63 y.o.   MRN: [id number]  HPI: Matthew Robles is a 63 y.o. male presenting on 06/12/2018 for Hypertension   HPI   Pt with CKD due to longstanding poorly controlled HTN  -He has been to cardiology since he was last here  -He says nephrology had to reschedule and he thinks he goes in February now  -Pt is out of his labetolol  -pt says he is feeling well today  Relevant past medical, surgical, family and social history reviewed and updated as indicated. Interim medical history since our last visit reviewed. Allergies and medications reviewed and updated.   Current Outpatient Medications:  .  amLODipine (NORVASC) 10 MG tablet, Take 1 tablet (10 mg total) by mouth daily., Disp: 90 tablet, Rfl: 0 .  aspirin EC 81 MG EC tablet, Take 1 tablet (81 mg total) by mouth daily., Disp: , Rfl:  .  atorvastatin (LIPITOR) 40 MG tablet, Take 1 tablet (40 mg total) by mouth daily at 6 PM., Disp: 90 tablet, Rfl: 0 .  cloNIDine (CATAPRES) 0.2 MG tablet, Take 1 tablet (0.2 mg total) by mouth 3 (three) times daily., Disp: 270 tablet, Rfl: 3 .  hydrALAZINE (APRESOLINE) 100 MG tablet, Take 1 tablet (100 mg total) by mouth 3 (three) times daily. (Patient taking differently: Take 50 mg by mouth every 8 (eight) hours. ), Disp: 270 tablet, Rfl: 3 .  labetalol (NORMODYNE) 200 MG tablet, TAKE 2 TABLETS BY MOUTH TWICE DAILY (Patient not taking: Reported on 06/12/2018), Disp: 120 tablet, Rfl: 0    Review of Systems  Constitutional: Positive for fatigue. Negative for appetite change, chills, diaphoresis, fever and unexpected weight change.  HENT: Positive for dental problem. Negative for congestion, drooling, ear pain, facial swelling, hearing loss, mouth sores,  sneezing, sore throat, trouble swallowing and voice change.   Eyes: Negative for pain, discharge, redness, itching and visual disturbance.  Respiratory: Negative for cough, choking, shortness of breath and wheezing.   Cardiovascular: Negative for chest pain, palpitations and leg swelling.  Gastrointestinal: Positive for constipation. Negative for abdominal pain, blood in stool, diarrhea and vomiting.  Endocrine: Positive for polydipsia. Negative for cold intolerance and heat intolerance.  Genitourinary: Negative for decreased urine volume, dysuria and hematuria.  Musculoskeletal: Negative for arthralgias, back pain and gait problem.  Skin: Negative for rash.  Allergic/Immunologic: Negative for environmental allergies.  Neurological: Negative for seizures, syncope, light-headedness and headaches.  Hematological: Negative for adenopathy.  Psychiatric/Behavioral: Negative for agitation, dysphoric mood and suicidal ideas. The patient is not nervous/anxious.     Per HPI unless specifically indicated above     Objective:    BP (!) 194/95 (BP Location: Left Arm, Patient Position: Sitting, Cuff Size: Normal)   Pulse 79   Temp 97.9 F (36.6 C)   Wt 185 lb (83.9 kg)   SpO2 96%   BMI 25.80 kg/m   Wt Readings from Last 3 Encounters:  06/12/18 185 lb (83.9 kg)  06/02/18 182 lb (82.6 kg)  04/12/18 183 lb 8 oz (83.2 kg)    Physical Exam Vitals signs reviewed.  Constitutional:      Appearance: He is well-developed.  HENT:  Head: Normocephalic and atraumatic.  Neck:     Musculoskeletal: Neck supple.  Cardiovascular:     Rate and Rhythm: Normal rate and regular rhythm.  Pulmonary:     Effort: Pulmonary effort is normal.     Breath sounds: Normal breath sounds. No wheezing.  Abdominal:     General: Bowel sounds are normal.     Palpations: Abdomen is soft.     Tenderness: There is no abdominal tenderness.  Lymphadenopathy:     Cervical: No cervical adenopathy.  Skin:    General:  Skin is warm and dry.  Neurological:     Mental Status: He is alert and oriented to person, place, and time.  Psychiatric:        Behavior: Behavior normal.     Results for orders placed or performed during the hospital encounter of 03/15/18  CBC  Result Value Ref Range   WBC 7.2 4.0 - 10.5 K/uL   RBC 4.49 4.22 - 5.81 MIL/uL   Hemoglobin 13.1 13.0 - 17.0 g/dL   HCT 46.5 03.5 - 46.5 %   MCV 90.9 80.0 - 100.0 fL   MCH 29.2 26.0 - 34.0 pg   MCHC 32.1 30.0 - 36.0 g/dL   RDW 68.1 27.5 - 17.0 %   Platelets 263 150 - 400 K/uL   nRBC 0.0 0.0 - 0.2 %  Basic metabolic panel  Result Value Ref Range   Sodium 136 135 - 145 mmol/L   Potassium 3.5 3.5 - 5.1 mmol/L   Chloride 100 98 - 111 mmol/L   CO2 28 22 - 32 mmol/L   Glucose, Bld 160 (H) 70 - 99 mg/dL   BUN 28 (H) 8 - 23 mg/dL   Creatinine, Ser 0.17 (H) 0.61 - 1.24 mg/dL   Calcium 9.2 8.9 - 49.4 mg/dL   GFR calc non Af Amer 29 (L) >60 mL/min   GFR calc Af Amer 34 (L) >60 mL/min   Anion gap 8 5 - 15      Assessment & Plan:   Encounter Diagnoses  Name Primary?  . Hypertension, unspecified type Yes  . Chronic kidney disease, unspecified CKD stage   . Noncompliance with medications   . Type 2 diabetes mellitus with other specified complication, without long-term current use of insulin (HCC)   . Congestive heart failure, unspecified HF chronicity, unspecified heart failure type (HCC)      -Pt urged to avoid running out of his medications.  -Discussed that elevated BP that happens when he runs out of his medication increases his risk for stroke, MI or more kidney damage.   -pt has declined medication for his DM.  Will readdress at next OV after labs repeated -follow up 1 month.  RTO sooner prn

## 2018-06-21 ENCOUNTER — Ambulatory Visit (INDEPENDENT_AMBULATORY_CARE_PROVIDER_SITE_OTHER): Payer: Self-pay

## 2018-06-21 DIAGNOSIS — I10 Essential (primary) hypertension: Secondary | ICD-10-CM

## 2018-06-28 ENCOUNTER — Telehealth: Payer: Self-pay

## 2018-06-28 NOTE — Telephone Encounter (Signed)
-----   Message from Antoine Poche, MD sent at 06/26/2018  9:40 AM EST ----- Ultrasound shows no blockages in the arteries of the kidney. I don't see where he came for his nursing visit for repeat bp check. He was also to bring all his pills and bp cuff to compare   Dominga Ferry MD

## 2018-06-28 NOTE — Telephone Encounter (Signed)
Called pt., no answer. Left message for pt to return call.  

## 2018-07-07 ENCOUNTER — Other Ambulatory Visit (HOSPITAL_COMMUNITY)
Admission: RE | Admit: 2018-07-07 | Discharge: 2018-07-07 | Disposition: A | Discharge status: Home / Self Care | Payer: Self-pay | Source: Ambulatory Visit | Attending: Physician Assistant | Admitting: Physician Assistant

## 2018-07-07 DIAGNOSIS — I10 Essential (primary) hypertension: Secondary | ICD-10-CM | POA: Insufficient documentation

## 2018-07-07 DIAGNOSIS — N189 Chronic kidney disease, unspecified: Secondary | ICD-10-CM | POA: Insufficient documentation

## 2018-07-07 DIAGNOSIS — I509 Heart failure, unspecified: Secondary | ICD-10-CM | POA: Insufficient documentation

## 2018-07-07 DIAGNOSIS — E1169 Type 2 diabetes mellitus with other specified complication: Secondary | ICD-10-CM | POA: Insufficient documentation

## 2018-07-07 LAB — HEMOGLOBIN A1C
HEMOGLOBIN A1C: 13.6 % — AB (ref 4.8–5.6)
MEAN PLASMA GLUCOSE: 343.62 mg/dL

## 2018-07-07 LAB — BASIC METABOLIC PANEL
Anion gap: 10 (ref 5–15)
BUN: 32 mg/dL — AB (ref 8–23)
CHLORIDE: 95 mmol/L — AB (ref 98–111)
CO2: 27 mmol/L (ref 22–32)
Calcium: 8.7 mg/dL — ABNORMAL LOW (ref 8.9–10.3)
Creatinine, Ser: 2.25 mg/dL — ABNORMAL HIGH (ref 0.61–1.24)
GFR calc non Af Amer: 30 mL/min — ABNORMAL LOW (ref >60)
GFR, EST AFRICAN AMERICAN: 35 mL/min — AB (ref >60)
Glucose, Bld: 448 mg/dL — ABNORMAL HIGH (ref 70–99)
POTASSIUM: 3.7 mmol/L (ref 3.5–5.1)
Sodium: 132 mmol/L — ABNORMAL LOW (ref 135–145)

## 2018-07-10 ENCOUNTER — Telehealth: Payer: Self-pay | Admitting: *Deleted

## 2018-07-10 NOTE — Telephone Encounter (Signed)
-----   Message from Antoine Poche, MD sent at 06/26/2018  9:40 AM EST ----- Ultrasound shows no blockages in the arteries of the kidney. I don't see where he came for his nursing visit for repeat bp check. He was also to bring all his pills and bp cuff to compare   Dominga Ferry MD

## 2018-07-10 NOTE — Telephone Encounter (Signed)
Called patient with test results. Left message to call back.  

## 2018-07-11 ENCOUNTER — Ambulatory Visit: Payer: Self-pay | Admitting: Physician Assistant

## 2018-07-11 ENCOUNTER — Telehealth: Payer: Self-pay

## 2018-07-11 ENCOUNTER — Encounter: Payer: Self-pay | Admitting: Physician Assistant

## 2018-07-11 VITALS — BP 178/89 | HR 72 | Temp 97.7°F | Ht 71.0 in | Wt 181.5 lb

## 2018-07-11 DIAGNOSIS — Z5320 Procedure and treatment not carried out because of patient's decision for unspecified reasons: Secondary | ICD-10-CM

## 2018-07-11 DIAGNOSIS — I509 Heart failure, unspecified: Secondary | ICD-10-CM

## 2018-07-11 DIAGNOSIS — I10 Essential (primary) hypertension: Secondary | ICD-10-CM

## 2018-07-11 DIAGNOSIS — E1165 Type 2 diabetes mellitus with hyperglycemia: Secondary | ICD-10-CM

## 2018-07-11 DIAGNOSIS — N189 Chronic kidney disease, unspecified: Secondary | ICD-10-CM

## 2018-07-11 NOTE — Telephone Encounter (Signed)
Called to follow up for Case Management for [facility] after a follow up visit to his primary care provider at the [facility]. Client states his blood pressure was "up a little" and "my blood sugar labs were up some too" when RN inquired what those numbers were, client had difficulty recalling those numbers. RN reviewed labs and reviewed his medications. Discussed the effects of uncontrolled blood pressure and uncontrolled diabetes is on his body. We discussed risks of stroke, heart attack and death. Discussed with client his kidneys and how the increased blood pressure will worsen the function of his kidneys and that they are necessary to help filter " bad waste products" from his body. We also discussed the affects of uncontrolled diabetes and hypertension on his heart and circulatory system.  Client states " I love to cook and I know I fell off the wagon" "I plan on giving this my full attention" Client states that he asked the provider to wait one more month before changing his medications. Plan for Case manager to meet with client after his next follow up appointment to evaluate any next steps and goals. Client agreeable.  Inquired if client had ever had any diabetes education as far as nutrition. Client states he has, however, CM discussed the free class offered at [facility] the first Monday of each month and told him there is a morning class as well as an evening class. Phone number given to client to register and again reinforced the importance of getting his hypertension and diabetes under control. This may require medication changes or medications added to what he is taking in addition to diet and exercise. Client states understanding and states he will be willing to discuss that. Client again states he will be giving this his full attention.  Plan to follow up with client at his next Free clinic appointment on 08/09/18 at 0900.  Francee Nodal RN

## 2018-07-11 NOTE — Progress Notes (Signed)
BP (!) 178/89 (BP Location: Right Arm, Patient Position: Sitting, Cuff Size: Normal)   Pulse 72   Temp 97.7 F (36.5 C) (Other (Comment))   Ht 5\' 11"  (1.803 m)   Wt 181 lb 8 oz (82.3 kg)   SpO2 98%   BMI 25.31 kg/m    Subjective:    Patient ID: Matthew Robles, male    DOB: [birth year], 63 y.o.   MRN: [id number]  HPI: Matthew Robles is a 63 y.o. male presenting on 07/11/2018 for Hypertension   HPI   Pt says he went to nephrology for an ultrasound earlier this month.  He says he was told they would call him but he hasn't heard yet.  Pt had normal renal US in October in [facility]  Pt says he is feeling fine today.   Relevant past medical, surgical, family and social history reviewed and updated as indicated. Interim medical history since our last visit reviewed. Allergies and medications reviewed and updated.   Current Outpatient Medications:  .  amLODipine (NORVASC) 10 MG tablet, Take 1 tablet (10 mg total) by mouth daily., Disp: 90 tablet, Rfl: 0 .  aspirin EC 81 MG EC tablet, Take 1 tablet (81 mg total) by mouth daily., Disp: , Rfl:  .  atorvastatin (LIPITOR) 40 MG tablet, Take 1 tablet (40 mg total) by mouth daily at 6 PM., Disp: 90 tablet, Rfl: 0 .  cloNIDine (CATAPRES) 0.2 MG tablet, Take 1 tablet (0.2 mg total) by mouth 3 (three) times daily., Disp: 270 tablet, Rfl: 3 .  hydrALAZINE (APRESOLINE) 100 MG tablet, Take 1 tablet (100 mg total) by mouth 3 (three) times daily. (Patient taking differently: Take 50 mg by mouth every 8 (eight) hours. ), Disp: 270 tablet, Rfl: 3 .  labetalol (NORMODYNE) 200 MG tablet, Take 2 tablets (400 mg total) by mouth 2 (two) times daily., Disp: 120 tablet, Rfl: 1    Review of Systems  Constitutional: Positive for chills. Negative for appetite change, diaphoresis, fatigue, fever and unexpected weight change.  HENT: Positive for dental problem. Negative for congestion, drooling, ear pain, facial swelling, hearing loss, mouth sores, sneezing, sore  throat, trouble swallowing and voice change.   Eyes: Negative for pain, discharge, redness, itching and visual disturbance.  Respiratory: Negative for cough, choking, shortness of breath and wheezing.   Cardiovascular: Negative for chest pain, palpitations and leg swelling.  Gastrointestinal: Positive for constipation. Negative for abdominal pain, blood in stool, diarrhea and vomiting.  Endocrine: Positive for heat intolerance and polydipsia. Negative for cold intolerance.  Genitourinary: Negative for decreased urine volume, dysuria and hematuria.  Musculoskeletal: Negative for arthralgias, back pain and gait problem.  Skin: Positive for rash.  Allergic/Immunologic: Negative for environmental allergies.  Neurological: Positive for light-headedness. Negative for seizures, syncope and headaches.  Hematological: Negative for adenopathy.  Psychiatric/Behavioral: Negative for agitation, dysphoric mood and suicidal ideas. The patient is not nervous/anxious.     Per HPI unless specifically indicated above     Objective:    BP (!) 178/89 (BP Location: Right Arm, Patient Position: Sitting, Cuff Size: Normal)   Pulse 72   Temp 97.7 F (36.5 C) (Other (Comment))   Ht 5\' 11"  (1.803 m)   Wt 181 lb 8 oz (82.3 kg)   SpO2 98%   BMI 25.31 kg/m   Wt Readings from Last 3 Encounters:  07/11/18 181 lb 8 oz (82.3 kg)  06/12/18 185 lb (83.9 kg)  06/02/18 182 lb (82.6 kg)    Physical Exam Vitals  signs and nursing note reviewed.  Constitutional:      Appearance: Normal appearance. He is normal weight.  HENT:     Head: Normocephalic and atraumatic.  Pulmonary:     Effort: Pulmonary effort is normal. No respiratory distress.  Neurological:     Mental Status: He is alert and oriented to person, place, and time.  Psychiatric:        Mood and Affect: Affect is angry.        Behavior: Behavior is aggressive.     Results for orders placed or performed during the hospital encounter of 07/07/18   Basic metabolic panel  Result Value Ref Range   Sodium 132 (L) 135 - 145 mmol/L   Potassium 3.7 3.5 - 5.1 mmol/L   Chloride 95 (L) 98 - 111 mmol/L   CO2 27 22 - 32 mmol/L   Glucose, Bld 448 (H) 70 - 99 mg/dL   BUN 32 (H) 8 - 23 mg/dL   Creatinine, Ser 8.11 (H) 0.61 - 1.24 mg/dL   Calcium 8.7 (L) 8.9 - 10.3 mg/dL   GFR calc non Af Amer 30 (L) >60 mL/min   GFR calc Af Amer 35 (L) >60 mL/min   Anion gap 10 5 - 15  Hemoglobin A1c  Result Value Ref Range   Hgb A1c MFr Bld 13.6 (H) 4.8 - 5.6 %   Mean Plasma Glucose 343.62 mg/dL      Assessment & Plan:   Encounter Diagnoses  Name Primary?  . HTN (hypertension), malignant Yes  . Chronic kidney disease, unspecified CKD stage   . Uncontrolled type 2 diabetes mellitus with hyperglycemia (HCC)   . Congestive heart failure, unspecified HF chronicity, unspecified heart failure type (HCC)   . Refusal of medication     -reviewed labs with pt  -discussed today's blood pressure and labs with a1c of 13.6 with pt and he is adamantly refusing any more medication.  He says he can control it with his diet and lifestyle.  Reminded pt that this is what he has said at previous OV when his a1c was in the 7s and it has increased substantially.  Reminded pt that he already has substantial kidney damage due to uncontrolled BP and DM.  Also that he is at risk of stroke or MI or further end organ damage with current BP.  Pt still declines medication changes.  Recommended pt no longer being a patient here at the [facility] but he insists he wants to keep coming here even though he doesn't want "treatment from a pharmacy".  Pt has raised voice and persists at shaking his finger at provider and declines to stop when asked.  Provider is uncomfortable and offers to get pt appointment with clinic's Executive Director but pt will only agree to leave today after being offered appointment to recheck BP in one month. -after pt has left office, situation was discussed with  Librarian, academic who states that RN from [employer] will accompany provider to pt next appointment -no medication changes today due to pt declines to take any more medication

## 2018-07-12 ENCOUNTER — Other Ambulatory Visit: Payer: Self-pay | Admitting: Physician Assistant

## 2018-07-13 ENCOUNTER — Other Ambulatory Visit: Payer: Self-pay | Admitting: Physician Assistant

## 2018-08-02 ENCOUNTER — Ambulatory Visit (INDEPENDENT_AMBULATORY_CARE_PROVIDER_SITE_OTHER): Payer: Self-pay | Admitting: Cardiology

## 2018-08-02 ENCOUNTER — Other Ambulatory Visit: Payer: Self-pay

## 2018-08-02 ENCOUNTER — Encounter: Payer: Self-pay | Admitting: Cardiology

## 2018-08-02 ENCOUNTER — Other Ambulatory Visit (HOSPITAL_COMMUNITY)
Admission: RE | Admit: 2018-08-02 | Discharge: 2018-08-02 | Disposition: A | Discharge status: Home / Self Care | Payer: Self-pay | Source: Ambulatory Visit | Attending: Cardiology | Admitting: Cardiology

## 2018-08-02 VITALS — BP 130/88 | HR 72 | Ht 72.0 in | Wt 188.0 lb

## 2018-08-02 DIAGNOSIS — I5032 Chronic diastolic (congestive) heart failure: Secondary | ICD-10-CM

## 2018-08-02 DIAGNOSIS — I119 Hypertensive heart disease without heart failure: Secondary | ICD-10-CM | POA: Insufficient documentation

## 2018-08-02 DIAGNOSIS — I10 Essential (primary) hypertension: Secondary | ICD-10-CM | POA: Insufficient documentation

## 2018-08-02 LAB — TSH: TSH: 2.496 u[IU]/mL (ref 0.350–4.500)

## 2018-08-02 MED ORDER — LABETALOL HCL 300 MG PO TABS: 600.0000 mg | ORAL_TABLET | Freq: Two times a day (BID) | ORAL | 3 refills | Status: DC

## 2018-08-02 NOTE — Progress Notes (Signed)
Clinical Summary Matthew Robles is a 63 y.o.male seen today for follow up of the following medical problems.   1. Chronic diastolic HF - echo 02/2018 LVEF 50-55%, grade II diastolic dysfunction, mild RV dysfunction - no recent SOB, no LE edema   2. HTN - admission 02/2018 with HTN emergency and SBP in 260s, had not been taking his home meds.  - presented with pulmonary edema. Mild trop elevation in setting of severe HTN and CHF.  Jan 2020 renal US: no stenosis - he has not gone for his TSH and renin/aldo labs since last visit - reports medication compliance   3. CKD 3 - followed by pcp   4. History of CVA  Past Medical History:  Diagnosis Date  . Diabetes mellitus without complication (HCC)   . Hypertension   . Stroke [facility])    2017     No Known Allergies   Current Outpatient Medications  Medication Sig Dispense Refill  . amLODipine (NORVASC) 10 MG tablet TAKE 1 TABLET BY MOUTH ONCE DAILY 30 tablet 0  . aspirin EC 81 MG EC tablet Take 1 tablet (81 mg total) by mouth daily.    Marland Kitchen atorvastatin (LIPITOR) 40 MG tablet Take 1 tablet (40 mg total) by mouth daily at 6 PM. 90 tablet 0  . cloNIDine (CATAPRES) 0.2 MG tablet Take 1 tablet (0.2 mg total) by mouth 3 (three) times daily. 270 tablet 3  . hydrALAZINE (APRESOLINE) 100 MG tablet Take 1 tablet (100 mg total) by mouth 3 (three) times daily. (Patient taking differently: Take 50 mg by mouth every 8 (eight) hours. ) 270 tablet 3  . labetalol (NORMODYNE) 200 MG tablet Take 2 tablets (400 mg total) by mouth 2 (two) times daily. 120 tablet 1   No current facility-administered medications for this visit.      Past Surgical History:  Procedure Laterality Date  . WISDOM TOOTH EXTRACTION       No Known Allergies    Family History  Problem Relation Age of Onset  . Diabetes Mother   . Hypertension Mother   . Diabetes Maternal Grandfather      Social History Matthew Robles reports that he quit smoking about  10 months ago. His smoking use included cigarettes. He smoked 1.00 pack per day. He has never used smokeless tobacco. Matthew Robles reports previous alcohol use.   Review of Systems CONSTITUTIONAL: No weight loss, fever, chills, weakness or fatigue.  HEENT: Eyes: No visual loss, blurred vision, double vision or yellow sclerae.No hearing loss, sneezing, congestion, runny nose or sore throat.  SKIN: No rash or itching.  CARDIOVASCULAR: per hpi RESPIRATORY: No shortness of breath, cough or sputum.  GASTROINTESTINAL: No anorexia, nausea, vomiting or diarrhea. No abdominal pain or blood.  GENITOURINARY: No burning on urination, no polyuria NEUROLOGICAL: No headache, dizziness, syncope, paralysis, ataxia, numbness or tingling in the extremities. No change in bowel or bladder control.  MUSCULOSKELETAL: No muscle, back pain, joint pain or stiffness.  LYMPHATICS: No enlarged nodes. No history of splenectomy.  PSYCHIATRIC: No history of depression or anxiety.  ENDOCRINOLOGIC: No reports of sweating, cold or heat intolerance. No polyuria or polydipsia.  Marland Kitchen   Physical Examination Today's Vitals   08/02/18 1007  BP: 130/88  Pulse: 72  SpO2: 96%  Weight: 188 lb (85.3 kg)  Height: 6' (1.829 m)   Body mass index is 25.5 kg/m.  Gen: resting comfortably, no acute distress HEENT: no scleral icterus, pupils equal round and reactive, no  palptable cervical adenopathy,  CV: RRR, 2/6 systoilc murmur rusb, no jvd Resp: Clear to auscultation bilaterally GI: abdomen is soft, non-tender, non-distended, normal bowel sounds, no hepatosplenomegaly MSK: extremities are warm, no edema.  Skin: warm, no rash Neuro:  no focal deficits Psych: appropriate affect   Diagnostic Studies 02/2018 echo Study Conclusions  - Left ventricle: The cavity size was normal. Wall thickness was increased in a pattern of moderate LVH. Systolic function was normal. The estimated ejection fraction was in the range of  50% to 55%. Wall motion was normal; there were no regional wall motion abnormalities. Features are consistent with a pseudonormal left ventricular filling pattern, with concomitant abnormal relaxation and increased filling pressure (grade 2 diastolic dysfunction). Doppler parameters are consistent with high ventricular filling pressure. - Aortic valve: Mildly calcified annulus. Trileaflet; mildly thickened leaflets. Valve area (VTI): 2.17 cm^2. Valve area (Vmax): 2.28 cm^2. - Left atrium: The atrium was severely dilated. - Right ventricle: The cavity size was mildly dilated. Systolic function was mildly reduced. - Right atrium: The atrium was severely dilated. - Technically adequate study.    Assessment and Plan  1. HTN - severe HTN. Several year history of medication noncompliance has led to significant end organ damage with kidney dysfunction and hypertensive heart disease with diastolic dysfunction - medical therapy options limited by renal dysfunction - renal artery [place] normal, reorder TSH and renin/aldo levels. May obtain OSA evaluation in future - manual bp 165/90, dynamap bp was inaccurate. Increase labetalol to 600mg  bid   2. Chronic diastolic HF/hypertensive heart disease - euvolemic and asymptomatic, continue to monitor.    BP log in 1 week. F/u 4 months   Matthew Robles, M.D.

## 2018-08-02 NOTE — Patient Instructions (Signed)
Medication Instructions:  Increase labetalol to 600 mg - two times daily   Labwork: none  Testing/Procedures: none  Follow-Up: Your physician recommends that you schedule a follow-up appointment in: 4 months    Any Other Special Instructions Will Be Listed Below (If Applicable).   Please keep blood pressure log for 1 week, then call or drop off at office.   If you need a refill on your cardiac medications before your next appointment, please call your pharmacy.   DASH Eating Plan DASH stands for "Dietary Approaches to Stop Hypertension." The DASH eating plan is a healthy eating plan that has been shown to reduce high blood pressure (hypertension). It may also reduce your risk for type 2 diabetes, heart disease, and stroke. The DASH eating plan may also help with weight loss. What are tips for following this plan?  General guidelines  Avoid eating more than 2,300 mg (milligrams) of salt (sodium) a day. If you have hypertension, you may need to reduce your sodium intake to 1,500 mg a day.  Limit alcohol intake to no more than 1 drink a day for nonpregnant women and 2 drinks a day for men. One drink equals 12 oz of beer, 5 oz of wine, or 1 oz of hard liquor.  Work with your health care provider to maintain a healthy body weight or to lose weight. Ask what an ideal weight is for you.  Get at least 30 minutes of exercise that causes your heart to beat faster (aerobic exercise) most days of the week. Activities may include walking, swimming, or biking.  Work with your health care provider or diet and nutrition specialist (dietitian) to adjust your eating plan to your individual calorie needs. Reading food labels   Check food labels for the amount of sodium per serving. Choose foods with less than 5 percent of the Daily Value of sodium. Generally, foods with less than 300 mg of sodium per serving fit into this eating plan.  To find whole grains, look for the word "whole" as the  first word in the ingredient list. Shopping  Buy products labeled as "low-sodium" or "no salt added."  Buy fresh foods. Avoid canned foods and premade or frozen meals. Cooking  Avoid adding salt when cooking. Use salt-free seasonings or herbs instead of table salt or sea salt. Check with your health care provider or pharmacist before using salt substitutes.  Do not fry foods. Cook foods using healthy methods such as baking, boiling, grilling, and broiling instead.  Cook with heart-healthy oils, such as olive, canola, soybean, or sunflower oil. Meal planning  Eat a balanced diet that includes: ? 5 or more servings of fruits and vegetables each day. At each meal, try to fill half of your plate with fruits and vegetables. ? Up to 6-8 servings of whole grains each day. ? Less than 6 oz of lean meat, poultry, or fish each day. A 3-oz serving of meat is about the same size as a deck of cards. One egg equals 1 oz. ? 2 servings of low-fat dairy each day. ? A serving of nuts, seeds, or beans 5 times each week. ? Heart-healthy fats. Healthy fats called Omega-3 fatty acids are found in foods such as flaxseeds and coldwater fish, like sardines, salmon, and mackerel.  Limit how much you eat of the following: ? Canned or prepackaged foods. ? Food that is high in trans fat, such as fried foods. ? Food that is high in saturated fat, such as fatty  meat. ? Sweets, desserts, sugary drinks, and other foods with added sugar. ? Full-fat dairy products.  Do not salt foods before eating.  Try to eat at least 2 vegetarian meals each week.  Eat more home-cooked food and less restaurant, buffet, and fast food.  When eating at a restaurant, ask that your food be prepared with less salt or no salt, if possible. What foods are recommended? The items listed may not be a complete list. Talk with your dietitian about what dietary choices are best for you. Grains Whole-grain or whole-wheat bread. Whole-grain  or whole-wheat pasta. Brown rice. Orpah Cobb. Bulgur. Whole-grain and low-sodium cereals. Pita bread. Low-fat, low-sodium crackers. Whole-wheat flour tortillas. Vegetables Fresh or frozen vegetables (raw, steamed, roasted, or grilled). Low-sodium or reduced-sodium tomato and vegetable juice. Low-sodium or reduced-sodium tomato sauce and tomato paste. Low-sodium or reduced-sodium canned vegetables. Fruits All fresh, dried, or frozen fruit. Canned fruit in natural juice (without added sugar). Meat and other protein foods Skinless chicken or [place]. Ground chicken or [place]. Pork with fat trimmed off. Fish and seafood. Egg whites. Dried beans, peas, or lentils. Unsalted nuts, nut butters, and seeds. Unsalted canned beans. Lean cuts of beef with fat trimmed off. Low-sodium, lean deli meat. Dairy Low-fat (1%) or fat-free (skim) milk. Fat-free, low-fat, or reduced-fat cheeses. Nonfat, low-sodium ricotta or cottage cheese. Low-fat or nonfat yogurt. Low-fat, low-sodium cheese. Fats and oils Soft margarine without trans fats. Vegetable oil. Low-fat, reduced-fat, or light mayonnaise and salad dressings (reduced-sodium). Canola, safflower, olive, soybean, and sunflower oils. Avocado. Seasoning and other foods Herbs. Spices. Seasoning mixes without salt. Unsalted popcorn and pretzels. Fat-free sweets. What foods are not recommended? The items listed may not be a complete list. Talk with your dietitian about what dietary choices are best for you. Grains Baked goods made with fat, such as croissants, muffins, or some breads. Dry pasta or rice meal packs. Vegetables Creamed or fried vegetables. Vegetables in a cheese sauce. Regular canned vegetables (not low-sodium or reduced-sodium). Regular canned tomato sauce and paste (not low-sodium or reduced-sodium). Regular tomato and vegetable juice (not low-sodium or reduced-sodium). Rosita Fire. Olives. Fruits Canned fruit in a light or heavy syrup. Fried fruit.  Fruit in cream or butter sauce. Meat and other protein foods Fatty cuts of meat. Ribs. Fried meat. Tomasa Blase. Sausage. Bologna and other processed lunch meats. Salami. Fatback. Hotdogs. Bratwurst. Salted nuts and seeds. Canned beans with added salt. Canned or smoked fish. Whole eggs or egg yolks. Chicken or [place] with skin. Dairy Whole or 2% milk, cream, and half-and-half. Whole or full-fat cream cheese. Whole-fat or sweetened yogurt. Full-fat cheese. Nondairy creamers. Whipped toppings. Processed cheese and cheese spreads. Fats and oils Butter. Stick margarine. Lard. Shortening. Ghee. Bacon fat. Tropical oils, such as coconut, palm kernel, or palm oil. Seasoning and other foods Salted popcorn and pretzels. Onion salt, garlic salt, seasoned salt, table salt, and sea salt. Worcestershire sauce. Tartar sauce. Barbecue sauce. Teriyaki sauce. Soy sauce, including reduced-sodium. Steak sauce. Canned and packaged gravies. Fish sauce. Oyster sauce. Cocktail sauce. Horseradish that you find on the shelf. Ketchup. Mustard. Meat flavorings and tenderizers. Bouillon cubes. Hot sauce and Tabasco sauce. Premade or packaged marinades. Premade or packaged taco seasonings. Relishes. Regular salad dressings. Where to find more information:  National Heart, Lung, and Blood Institute: PopSteam.is  American Heart Association: www.heart.org Summary  The DASH eating plan is a healthy eating plan that has been shown to reduce high blood pressure (hypertension). It may also reduce your risk for type  2 diabetes, heart disease, and stroke.  With the DASH eating plan, you should limit salt (sodium) intake to 2,300 mg a day. If you have hypertension, you may need to reduce your sodium intake to 1,500 mg a day.  When on the DASH eating plan, aim to eat more fresh fruits and vegetables, whole grains, lean proteins, low-fat dairy, and heart-healthy fats.  Work with your health care provider or diet and nutrition  specialist (dietitian) to adjust your eating plan to your individual calorie needs. This information is not intended to replace advice given to you by your health care provider. Make sure you discuss any questions you have with your health care provider. Document Released: 04/29/2011 Document Revised: 05/03/2016 Document Reviewed: 05/03/2016 Elsevier Interactive Patient Education  2019 [employer].

## 2018-08-07 LAB — ALDOSTERONE + RENIN ACTIVITY W/ RATIO
ALDO / PRA Ratio: 92.2 — ABNORMAL HIGH (ref 0.0–30.0)
ALDOSTERONE: 18.8 ng/dL (ref 0.0–30.0)
PRA LC/MS/MS: 0.204 ng/mL/hr (ref 0.167–5.380)

## 2018-08-09 ENCOUNTER — Encounter: Payer: Self-pay | Admitting: Physician Assistant

## 2018-08-09 ENCOUNTER — Ambulatory Visit: Payer: Self-pay | Admitting: Physician Assistant

## 2018-08-09 ENCOUNTER — Other Ambulatory Visit: Payer: Self-pay

## 2018-08-09 VITALS — BP 180/92 | HR 91 | Temp 97.9°F | Ht 72.0 in

## 2018-08-09 DIAGNOSIS — I10 Essential (primary) hypertension: Secondary | ICD-10-CM

## 2018-08-09 DIAGNOSIS — I509 Heart failure, unspecified: Secondary | ICD-10-CM

## 2018-08-09 DIAGNOSIS — N189 Chronic kidney disease, unspecified: Secondary | ICD-10-CM

## 2018-08-09 DIAGNOSIS — E1165 Type 2 diabetes mellitus with hyperglycemia: Secondary | ICD-10-CM

## 2018-08-09 MED ORDER — CLONIDINE HCL 0.3 MG PO TABS: 0.3000 mg | ORAL_TABLET | Freq: Two times a day (BID) | ORAL | 2 refills | Status: DC

## 2018-08-09 MED ORDER — METFORMIN HCL 1000 MG PO TABS: 1000.0000 mg | ORAL_TABLET | Freq: Two times a day (BID) | ORAL | 3 refills | Status: DC

## 2018-08-09 NOTE — Patient Instructions (Signed)
Kidney doctor-     [phone]

## 2018-08-09 NOTE — Progress Notes (Signed)
BP (!) 180/92 (BP Location: Left Arm, Patient Position: Sitting, Cuff Size: Normal)   Pulse 91   Temp 97.9 F (36.6 C)   Ht 6' (1.829 m)   SpO2 97%   BMI 25.50 kg/m    Subjective:    Patient ID: Matthew Robles, male    DOB: [birth year], 63 y.o.   MRN: [id number]  HPI: Matthew Robles is a 63 y.o. male presenting on 08/09/2018 for Hypertension   HPI   Pt denies fever and cough.  Pt previously unwilling to take more medications to help his HTN and DM.  (see previous OV note).  Pt says he is now willing to take more meds now, both for his blood pressure and for his diabetes.   He says he realizes that he needs both good diet and medications.  Pt says he Was on glucophage in the past.  He did okay with the medication.   Pt says his medicare is not set to start until February 2021.    Pt never went for follow up to kidney doctor.  He says he only went to initial visit.  He says that was in January or February.    Epic review show US done in October.   When asked about his congestion, he says it is his sinus and he has had it for a while.    He says he feels well he is just blowing his nose.  He doesn't want to take anything for it he says because then he won't be able to blow it out.   Relevant past medical, surgical, family and social history reviewed and updated as indicated. Interim medical history since our last visit reviewed. Allergies and medications reviewed and updated.   Current Outpatient Medications:  .  amLODipine (NORVASC) 10 MG tablet, TAKE 1 TABLET BY MOUTH ONCE DAILY, Disp: 30 tablet, Rfl: 0 .  aspirin EC 81 MG EC tablet, Take 1 tablet (81 mg total) by mouth daily., Disp: , Rfl:  .  atorvastatin (LIPITOR) 40 MG tablet, Take 1 tablet (40 mg total) by mouth daily at 6 PM., Disp: 90 tablet, Rfl: 0 .  cloNIDine (CATAPRES) 0.2 MG tablet, Take 1 tablet (0.2 mg total) by mouth 3 (three) times daily., Disp: 270 tablet, Rfl: 3 .  hydrALAZINE (APRESOLINE) 100 MG tablet,  Take 1 tablet (100 mg total) by mouth 3 (three) times daily. (Patient taking differently: Take 50 mg by mouth every 8 (eight) hours. ), Disp: 270 tablet, Rfl: 3 .  labetalol (NORMODYNE) 200 MG tablet, Take 400 mg by mouth 2 (two) times daily., Disp: , Rfl:  .  labetalol (NORMODYNE) 300 MG tablet, Take 2 tablets (600 mg total) by mouth 2 (two) times daily., Disp: 360 tablet, Rfl: 3    Review of Systems  Constitutional: Positive for appetite change and fatigue. Negative for chills, diaphoresis, fever and unexpected weight change.  HENT: Positive for dental problem. Negative for congestion, drooling, ear pain, facial swelling, hearing loss, mouth sores, sneezing, sore throat, trouble swallowing and voice change.   Eyes: Negative for pain, discharge, redness, itching and visual disturbance.  Respiratory: Negative for cough, choking, shortness of breath and wheezing.   Cardiovascular: Negative for chest pain, palpitations and leg swelling.  Gastrointestinal: Positive for constipation. Negative for abdominal pain, blood in stool, diarrhea and vomiting.  Endocrine: Positive for polydipsia. Negative for cold intolerance and heat intolerance.  Genitourinary: Negative for decreased urine volume, dysuria and hematuria.  Musculoskeletal: Negative for arthralgias, back pain  and gait problem.  Skin: Negative for rash.  Allergic/Immunologic: Negative for environmental allergies.  Neurological: Negative for seizures, syncope, light-headedness and headaches.  Hematological: Negative for adenopathy.  Psychiatric/Behavioral: Negative for agitation, dysphoric mood and suicidal ideas. The patient is not nervous/anxious.     Per HPI unless specifically indicated above     Objective:    BP (!) 180/92 (BP Location: Left Arm, Patient Position: Sitting, Cuff Size: Normal)   Pulse 91   Temp 97.9 F (36.6 C)   Ht 6' (1.829 m)   SpO2 97%   BMI 25.50 kg/m   Wt Readings from Last 3 Encounters:  08/02/18 188 lb  (85.3 kg)  07/11/18 181 lb 8 oz (82.3 kg)  06/12/18 185 lb (83.9 kg)    Physical Exam Vitals signs reviewed.  Constitutional:      Appearance: He is well-developed.  HENT:     Head: Normocephalic and atraumatic.  Neck:     Musculoskeletal: Neck supple.  Cardiovascular:     Rate and Rhythm: Normal rate and regular rhythm.  Pulmonary:     Effort: Pulmonary effort is normal.     Breath sounds: Normal breath sounds. No wheezing.  Abdominal:     General: Bowel sounds are normal.     Palpations: Abdomen is soft.     Tenderness: There is no abdominal tenderness.  Musculoskeletal:     Right lower leg: No edema.     Left lower leg: No edema.  Lymphadenopathy:     Cervical: No cervical adenopathy.  Skin:    General: Skin is warm and dry.  Neurological:     Mental Status: He is alert and oriented to person, place, and time.  Psychiatric:        Behavior: Behavior normal.         Assessment & Plan:    Encounter Diagnoses  Name Primary?  Marland Kitchen Uncontrolled type 2 diabetes mellitus with hyperglycemia (HCC) Yes  . HTN (hypertension), malignant   . Chronic kidney disease, unspecified CKD stage   . Congestive heart failure, unspecified HF chronicity, unspecified heart failure type (HCC)     -Start metformin.  Pt counseled to continue DM diet.  Encouraged him to monitor his bs and notify office if fbs > 300.  Pt aware that single agent may be inadequate but will start with only 1 as this is huge step forward in pt's willingness to participate with medication advancement -Pt to call to schedule f/u with neprhologist. -will Increase clonidine to 0.3mg  tid  -Pt has a bp monitor at home.   -Pt has a smart phone -will plan for follow up 1 month.  RTO sooner prn

## 2018-08-16 ENCOUNTER — Other Ambulatory Visit: Payer: Self-pay | Admitting: Physician Assistant

## 2018-09-12 ENCOUNTER — Telehealth: Payer: Self-pay

## 2018-09-12 ENCOUNTER — Ambulatory Visit: Payer: Self-pay | Admitting: Physician Assistant

## 2018-09-12 ENCOUNTER — Encounter: Payer: Self-pay | Admitting: Physician Assistant

## 2018-09-12 DIAGNOSIS — E1165 Type 2 diabetes mellitus with hyperglycemia: Secondary | ICD-10-CM

## 2018-09-12 DIAGNOSIS — I10 Essential (primary) hypertension: Secondary | ICD-10-CM

## 2018-09-12 DIAGNOSIS — I509 Heart failure, unspecified: Secondary | ICD-10-CM

## 2018-09-12 DIAGNOSIS — N189 Chronic kidney disease, unspecified: Secondary | ICD-10-CM

## 2018-09-12 NOTE — Progress Notes (Signed)
There were no vitals taken for this visit.   Subjective:    Patient ID: Delvonte Somoza, male    DOB: [birth year], 63 y.o.   MRN: [id number]  HPI: Lovett Benda is a 63 y.o. male presenting on 09/12/2018 for No chief complaint on file.   HPI   This is a telemedicine visit through [employer] due to coronavirus pandemic.   I connected with  Meilech Luchsinger on 09/12/18 by a video enabled telemedicine application and verified that I am speaking with the correct person using two identifiers.   I discussed the limitations of evaluation and management by telemedicine. The patient expressed understanding and agreed to proceed.  Pt says he is doing fine.  He is checking his blood sugars and says the bs is  running 130 on average in the morning.  He says Some mornings were better without a late night snack the night before.    He says he usually checks his blood pressure every other day.  He says the bp is running -  142 on top most of the time  Pt is trying to stay active doing yardwork and the like.  He is following guidelines and not going out unless he has to.  He has no complaints today.    Relevant past medical, surgical, family and social history reviewed and updated as indicated. Interim medical history since our last visit reviewed. Allergies and medications reviewed and updated.   Current Outpatient Medications:  .  amLODipine (NORVASC) 10 MG tablet, Take 1 tablet by mouth once daily, Disp: 30 tablet, Rfl: 1 .  aspirin EC 81 MG EC tablet, Take 1 tablet (81 mg total) by mouth daily., Disp: , Rfl:  .  atorvastatin (LIPITOR) 40 MG tablet, Take 1 tablet (40 mg total) by mouth daily at 6 PM., Disp: 90 tablet, Rfl: 0 .  cloNIDine (CATAPRES) 0.3 MG tablet, Take 1 tablet (0.3 mg total) by mouth 2 (two) times daily., Disp: 90 tablet, Rfl: 2 .  COD LIVER OIL PO, Take by mouth., Disp: , Rfl:  .  hydrALAZINE (APRESOLINE) 100 MG tablet, Take 1 tablet (100 mg total) by mouth 3 (three) times daily.  (Patient taking differently: Take 50 mg by mouth every 8 (eight) hours. ), Disp: 270 tablet, Rfl: 3 .  labetalol (NORMODYNE) 200 MG tablet, Take 400 mg by mouth 2 (two) times daily., Disp: , Rfl:  .  metFORMIN (GLUCOPHAGE) 1000 MG tablet, Take 1 tablet (1,000 mg total) by mouth 2 (two) times daily with a meal., Disp: 60 tablet, Rfl: 3 .  Specialty Vitamins Products (MAGNESIUM, AMINO ACID CHELATE,) 133 MG tablet, Take 1 tablet by mouth 2 (two) times daily., Disp: , Rfl:  .  labetalol (NORMODYNE) 300 MG tablet, Take 2 tablets (600 mg total) by mouth 2 (two) times daily. (Patient not taking: Reported on 09/12/2018), Disp: 360 tablet, Rfl: 3    Review of Systems  Per HPI unless specifically indicated above     Objective:    There were no vitals taken for this visit.  Wt Readings from Last 3 Encounters:  08/02/18 188 lb (85.3 kg)  07/11/18 181 lb 8 oz (82.3 kg)  06/12/18 185 lb (83.9 kg)    Physical Exam HENT:     Head: Normocephalic and atraumatic.  Pulmonary:     Effort: Pulmonary effort is normal. No respiratory distress.  Neurological:     Mental Status: He is alert and oriented to person, place, and time.  Psychiatric:  Mood and Affect: Mood normal.        Behavior: Behavior normal.         Assessment & Plan:   Encounter Diagnoses  Name Primary?  Marland Kitchen. Uncontrolled type 2 diabetes mellitus with hyperglycemia (HCC) Yes  . Hypertension, unspecified type   . Congestive heart failure, unspecified HF chronicity, unspecified heart failure type (HCC)   . Chronic kidney disease, unspecified CKD stage     -Pt to increase to labetolol as rx by dr Wyline MoodBranch on 3/11- -pt to continue other medications -Pt to continue to monitor blood sugar and blood pressure -pt will follow up in early June and will update labs at that time -pt is to contact office sooner if he has any problems

## 2018-09-12 NOTE — Telephone Encounter (Signed)
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 Last follow up was in person on 08/09/18 and met with client and provider during his visit.  Client agreed to begin medications for his diabetes at that time. Today client had an E-visit with his medical provider at [facility] or [place].  Called to follow up. Client reports his e-visit went well. Client reports he is taking his glucophage as prescribed and that he is checking his blood sugars. He reports they are running in "100's" in the mornings. Client reports he is changed his late night snacking routine and is not eating icecream any longer as a late night snack. He reports he is eating popcorn. Discussed that he also needs to not add any salt to his diet due to high blood pressure. He states he is not adding salt. Previously had referred client to Diabetic Nutrition classes at [facility] however due to COVID-19 those are temporarily not available. Discussed with client the MyPlate method of eating and meal planning. Will send client some MyPlate handouts of meal planning, the plate method as well as alternative seasonings for cooking to replace salt in the mail. Discussed with client the website of http://carter.biz/ as a resource for tips on meal planning and recipes to utilize until he can attend in person diabetic nutrition class. Client is agreeable to resources. Encouraged client to maintain a good relationship with his medical provider and to discuss his medical concerns with his provider and to follow their recommendations.  Client is getting some exercise in his yard, doing lawn work and sitting outside to help with his mood from social distancing. Will continue to follow up with client for Case Management.  Debria Garret RN [facility]

## 2018-10-12 ENCOUNTER — Other Ambulatory Visit: Payer: Self-pay | Admitting: Physician Assistant

## 2018-10-12 DIAGNOSIS — I509 Heart failure, unspecified: Secondary | ICD-10-CM

## 2018-10-12 DIAGNOSIS — I10 Essential (primary) hypertension: Secondary | ICD-10-CM

## 2018-10-12 DIAGNOSIS — E1165 Type 2 diabetes mellitus with hyperglycemia: Secondary | ICD-10-CM

## 2018-10-12 DIAGNOSIS — N189 Chronic kidney disease, unspecified: Secondary | ICD-10-CM

## 2018-10-25 ENCOUNTER — Other Ambulatory Visit: Payer: Self-pay | Admitting: Physician Assistant

## 2018-10-31 ENCOUNTER — Other Ambulatory Visit (HOSPITAL_COMMUNITY)
Admission: RE | Admit: 2018-10-31 | Discharge: 2018-10-31 | Disposition: A | Discharge status: Home / Self Care | Payer: Self-pay | Source: Ambulatory Visit | Attending: Physician Assistant | Admitting: Physician Assistant

## 2018-10-31 DIAGNOSIS — I509 Heart failure, unspecified: Secondary | ICD-10-CM | POA: Insufficient documentation

## 2018-10-31 DIAGNOSIS — I10 Essential (primary) hypertension: Secondary | ICD-10-CM | POA: Insufficient documentation

## 2018-10-31 DIAGNOSIS — N189 Chronic kidney disease, unspecified: Secondary | ICD-10-CM | POA: Insufficient documentation

## 2018-10-31 DIAGNOSIS — E1165 Type 2 diabetes mellitus with hyperglycemia: Secondary | ICD-10-CM | POA: Insufficient documentation

## 2018-10-31 LAB — COMPREHENSIVE METABOLIC PANEL
ALT: 16 U/L (ref 0–44)
AST: 22 U/L (ref 15–41)
Albumin: 4.2 g/dL (ref 3.5–5.0)
Alkaline Phosphatase: 59 U/L (ref 38–126)
Anion gap: 14 (ref 5–15)
BUN: 27 mg/dL — ABNORMAL HIGH (ref 8–23)
CO2: 24 mmol/L (ref 22–32)
Calcium: 8.5 mg/dL — ABNORMAL LOW (ref 8.9–10.3)
Chloride: 98 mmol/L (ref 98–111)
Creatinine, Ser: 2.21 mg/dL — ABNORMAL HIGH (ref 0.61–1.24)
GFR calc Af Amer: 35 mL/min — ABNORMAL LOW (ref >60)
GFR calc non Af Amer: 30 mL/min — ABNORMAL LOW (ref >60)
Glucose, Bld: 148 mg/dL — ABNORMAL HIGH (ref 70–99)
Potassium: 3.6 mmol/L (ref 3.5–5.1)
Sodium: 136 mmol/L (ref 135–145)
Total Bilirubin: 0.9 mg/dL (ref 0.3–1.2)
Total Protein: 7.7 g/dL (ref 6.5–8.1)

## 2018-10-31 LAB — LIPID PANEL
Cholesterol: 145 mg/dL (ref 0–200)
HDL: 34 mg/dL — ABNORMAL LOW (ref >40)
LDL Cholesterol: 86 mg/dL (ref 0–99)
Total CHOL/HDL Ratio: 4.3 RATIO
Triglycerides: 125 mg/dL (ref <150)
VLDL: 25 mg/dL (ref 0–40)

## 2018-10-31 LAB — HEMOGLOBIN A1C
Hgb A1c MFr Bld: 8.8 % — ABNORMAL HIGH (ref 4.8–5.6)
Mean Plasma Glucose: 205.86 mg/dL

## 2018-11-01 ENCOUNTER — Ambulatory Visit: Payer: Self-pay | Admitting: Physician Assistant

## 2018-11-01 ENCOUNTER — Encounter: Payer: Self-pay | Admitting: Physician Assistant

## 2018-11-01 ENCOUNTER — Other Ambulatory Visit: Payer: Self-pay

## 2018-11-01 VITALS — BP 110/68 | HR 77 | Temp 97.9°F | Wt 181.8 lb

## 2018-11-01 DIAGNOSIS — I10 Essential (primary) hypertension: Secondary | ICD-10-CM

## 2018-11-01 DIAGNOSIS — E1165 Type 2 diabetes mellitus with hyperglycemia: Secondary | ICD-10-CM

## 2018-11-01 DIAGNOSIS — I509 Heart failure, unspecified: Secondary | ICD-10-CM

## 2018-11-01 DIAGNOSIS — N189 Chronic kidney disease, unspecified: Secondary | ICD-10-CM

## 2018-11-01 NOTE — Progress Notes (Signed)
BP 110/68   Pulse 77   Temp 97.9 F (36.6 C)   Wt 181 lb 12.8 oz (82.5 kg)   SpO2 98%   BMI 24.66 kg/m    Subjective:    Patient ID: Matthew Robles, male    DOB: [birth year], 63 y.o.   MRN: [id number]  HPI: Matthew Robles is a 63 y.o. male presenting on 11/01/2018 for Diabetes and Hypertension   HPI  Pt had negative screeningquestionnairefor CV19  Pt says he went to nephrology.  He doesn't remember when his last appointment was.  He doesn't remember when he is supposed to go back.  Pt says he stopped his stating because he says he would get a pain in his head that would last for 3 seconds and then go away.  He stopped his lipitor- he did this approximately around his last visit here.  He says he stopped it because it gave him a headach  Discussed with pt tha he needs another agent to help with his blood sugar.   And with his kidney disease would recommend addition of insulin and pt agrees.    Relevant past medical, surgical, family and social history reviewed and updated as indicated. Interim medical history since our last visit reviewed. Allergies and medications reviewed and updated.   Current Outpatient Medications:  .  amLODipine (NORVASC) 10 MG tablet, Take 1 tablet by mouth once daily, Disp: 60 tablet, Rfl: 0 .  aspirin EC 81 MG EC tablet, Take 1 tablet (81 mg total) by mouth daily., Disp: , Rfl:  .  cloNIDine (CATAPRES) 0.3 MG tablet, Take 1 tablet (0.3 mg total) by mouth 2 (two) times daily., Disp: 90 tablet, Rfl: 2 .  COD LIVER OIL PO, Take by mouth., Disp: , Rfl:  .  hydrALAZINE (APRESOLINE) 100 MG tablet, Take 1 tablet (100 mg total) by mouth 3 (three) times daily. (Patient taking differently: Take 50 mg by mouth every 8 (eight) hours. ), Disp: 270 tablet, Rfl: 3 .  labetalol (NORMODYNE) 300 MG tablet, Take 2 tablets (600 mg total) by mouth 2 (two) times daily., Disp: 360 tablet, Rfl: 3 .  metFORMIN (GLUCOPHAGE) 1000 MG tablet, Take 1 tablet (1,000 mg total) by  mouth 2 (two) times daily with a meal., Disp: 60 tablet, Rfl: 3 .  Specialty Vitamins Products (MAGNESIUM, AMINO ACID CHELATE,) 133 MG tablet, Take 1 tablet by mouth 2 (two) times daily., Disp: , Rfl:  .  atorvastatin (LIPITOR) 40 MG tablet, Take 1 tablet (40 mg total) by mouth daily at 6 PM. (Patient not taking: Reported on 11/01/2018), Disp: 90 tablet, Rfl: 0 .  labetalol (NORMODYNE) 200 MG tablet, Take 400 mg by mouth 2 (two) times daily., Disp: , Rfl:     Review of Systems  Per HPI unless specifically indicated above     Objective:    BP 110/68   Pulse 77   Temp 97.9 F (36.6 C)   Wt 181 lb 12.8 oz (82.5 kg)   SpO2 98%   BMI 24.66 kg/m   Wt Readings from Last 3 Encounters:  11/01/18 181 lb 12.8 oz (82.5 kg)  08/02/18 188 lb (85.3 kg)  07/11/18 181 lb 8 oz (82.3 kg)    Physical Exam Vitals signs reviewed.  Constitutional:      Appearance: He is well-developed.  HENT:     Head: Normocephalic and atraumatic.  Neck:     Musculoskeletal: Neck supple.  Cardiovascular:     Rate and Rhythm: Normal rate and  regular rhythm.  Pulmonary:     Effort: Pulmonary effort is normal.     Breath sounds: Normal breath sounds. No wheezing.  Abdominal:     General: Bowel sounds are normal.     Palpations: Abdomen is soft.     Tenderness: There is no abdominal tenderness.  Musculoskeletal:     Right lower leg: No edema.     Left lower leg: No edema.  Lymphadenopathy:     Cervical: No cervical adenopathy.  Skin:    General: Skin is warm and dry.  Neurological:     Mental Status: He is alert and oriented to person, place, and time.  Psychiatric:        Attention and Perception: Attention normal.        Speech: Speech normal.     Results for orders placed or performed during the hospital encounter of 10/31/18  Lipid panel  Result Value Ref Range   Cholesterol 145 0 - 200 mg/dL   Triglycerides 125 <150 mg/dL   HDL 34 (L) >40 mg/dL   Total CHOL/HDL Ratio 4.3 RATIO   VLDL 25 0  - 40 mg/dL   LDL Cholesterol 86 0 - 99 mg/dL  Hemoglobin A1c  Result Value Ref Range   Hgb A1c MFr Bld 8.8 (H) 4.8 - 5.6 %   Mean Plasma Glucose 205.86 mg/dL  Comprehensive metabolic panel  Result Value Ref Range   Sodium 136 135 - 145 mmol/L   Potassium 3.6 3.5 - 5.1 mmol/L   Chloride 98 98 - 111 mmol/L   CO2 24 22 - 32 mmol/L   Glucose, Bld 148 (H) 70 - 99 mg/dL   BUN 27 (H) 8 - 23 mg/dL   Creatinine, Ser 2.21 (H) 0.61 - 1.24 mg/dL   Calcium 8.5 (L) 8.9 - 10.3 mg/dL   Total Protein 7.7 6.5 - 8.1 g/dL   Albumin 4.2 3.5 - 5.0 g/dL   AST 22 15 - 41 U/L   ALT 16 0 - 44 U/L   Alkaline Phosphatase 59 38 - 126 U/L   Total Bilirubin 0.9 0.3 - 1.2 mg/dL   GFR calc non Af Amer 30 (L) >60 mL/min   GFR calc Af Amer 35 (L) >60 mL/min   Anion gap 14 5 - 15      Assessment & Plan:    Encounter Diagnoses  Name Primary?  Marland Kitchen Uncontrolled type 2 diabetes mellitus with hyperglycemia ([place]) Yes  . Hypertension, unspecified type   . Chronic kidney disease, unspecified CKD stage   . Congestive heart failure, unspecified HF chronicity, unspecified heart failure type ([place])      -reviewed labs with pt   -Pt ins encouraged to contact nephrology for follow-up appointment  -He is willing to go on insulin. He is taught how to self-administer and will start with lantus 10u qhs  -Pt encouraged to wear mask when in public per recommendations of CDC to reduce risk of getting covid-19 virus  -pt will have telemedicine follow up in 1 month to review blood sugar log.  He is reminded to contact office for fbs < 70 or > 300

## 2018-11-01 NOTE — Patient Instructions (Signed)
Insulin Injection Instructions, Using Insulin Pens, Adult  A subcutaneous injection is a shot of medicine that is injected into the layer of fat and tissue between skin and muscle. People with type 1 diabetes must take insulin because their bodies do not make it. People with type 2 diabetes may need to take insulin.  There are many different types of insulin. The type of insulin that you take may determine how many injections you give yourself and when you need to give the injections.  Supplies needed:   Soap and water to wash hands.   Your insulin pen.   A new, unused needle.   Alcohol wipes.   A disposal container that is meant for sharp items (sharps container), such as an empty plastic bottle with a cover.  How to choose a site for injection  The body absorbs insulin differently, depending on where the insulin is injected (injection site). It is best to inject insulin into the same body area each time (for example, always in the abdomen), but you should use a different spot in that area for each injection. Do not inject the insulin in the same spot each time. There are five main areas that can be used for injecting. These areas include:   Abdomen. This is the preferred area.   Front of thigh.   Upper, outer side of thigh.   Upper, outer side of arm.   Upper, outer part of buttock.  How to use an insulin pen    First, follow the steps for Get ready, then continue with the steps for Inject the insulin.  Get ready  1. Wash your hands with soap and water. If soap and water are not available, use hand sanitizer.  2. Before you give yourself an insulin injection, be sure to test your blood sugar level (blood glucose level) and write down that number. Follow any instructions from your health care provider about what to do if your blood glucose level is higher or lower than your normal range.  3. Check the expiration date and the type of insulin that is in the pen.  4. If you are using CLEAR insulin, check to  see that it is clear and free of clumps.  5. If you are using CLOUDY insulin, do not shake the pen to get the injection ready. Instead, get it ready in one of these ways:  ? Gently roll the pen between your palms several times.  ? Tip the pen up and down several times.  6. Remove the cap from the insulin pen.  7. Use an alcohol wipe to clean the rubber tip of the pen.  8. Remove the protective paper tab from the disposable needle. Do not let the needle touch anything.  9. Screw a new, unused needle onto the pen.  10. Remove the outer plastic needle cover. Do not throw away the outer plastic cover yet.  ? If the pen uses a special safety needle, leave the inner needle shield in place.  ? If the pen does not use a special safety needle, remove the inner plastic cover from the needle.  11. Follow the manufacturer's instructions to prime the insulin pen with the volume of insulin needed. Hold the pen with the needle pointing up, and push the button on the opposite end of the pen until a drop of insulin appears at the needle tip. If no insulin appears, repeat this step.  12. Turn the button (dial) to the number of units of   insulin that you will be injecting.  Inject the insulin  1. Use an alcohol wipe to clean the site where you will be injecting the needle. Let the site air-dry.  2. Hold the pen in the palm of your writing hand like a pencil.  3. If directed by your health care provider, use your other hand to pinch and hold about an inch (2.5 cm) of skin at the injection site. Do not directly touch the cleaned part of the skin.  4. Gently but quickly, use your writing hand to put the needle straight into the skin. The needle should be at a 90-degree angle (perpendicular) to the skin.  5. When the needle is completely inserted into the skin, use your thumb or index finger of your writing hand to push the top button of the pen down all the way to inject the insulin.  6. Let go of the skin that you are pinching. Continue  to hold the pen in place with your writing hand.  7. Wait 10 seconds, then pull the needle straight out of the skin. This will allow all of the insulin to go from the pen and needle into your body.  8. Carefully put the larger (outer) plastic cover of the needle back over the needle, then unscrew the capped needle and discard it in a sharps container, such as an empty plastic bottle with a cover.  9. Put the plastic cap back on the insulin pen.  How to throw away supplies   Discard all used needles in a puncture-proof sharps disposal container. You can ask your local pharmacy about where you can get this kind of disposal container, or you can use an empty plastic liquid laundry detergent bottle that has a cover.   Follow the disposal regulations for the area where you live. Do not use any needle more than one time.   Throw away empty disposable pens in the regular trash.  Questions to ask your health care provider   How often should I be taking insulin?   How often should I check my blood glucose?   What amount of insulin should I be taking at each time?   What are the side effects?   What should I do if my blood glucose is too high?   What should I do if my blood glucose is too low?   What should I do if I forget to take my insulin?   What number should I call if I have questions?  Where to find more information   American Diabetes Association (ADA): www.diabetes.org   American Association of Diabetes Educators (AADE) Patient Resources: https://www.diabeteseducator.org  Summary   A subcutaneous injection is a shot of medicine that is injected into the layer of fat and tissue between skin and muscle.   Before you give yourself an insulin injection, be sure to test your blood sugar level (blood glucose level) and write down that number.   Check the expiration date and the type of insulin that is in the pen. The type of insulin that you take may determine how many injections you give yourself and when  you need to give the injections.   It is best to inject insulin into the same body area each time (for example, always in the abdomen), but you should use a different spot in that area for each injection.  This information is not intended to replace advice given to you by your health care provider. Make sure you discuss   any questions you have with your health care provider.  Document Released: 06/13/2015 Document Revised: 05/30/2017 Document Reviewed: 06/13/2015  [employer]vier Interactive Patient Education  2019 [employer]

## 2018-11-14 ENCOUNTER — Telehealth: Payer: Self-pay | Admitting: Physician Assistant

## 2018-11-22 ENCOUNTER — Encounter: Payer: Self-pay | Admitting: Physician Assistant

## 2018-11-22 ENCOUNTER — Ambulatory Visit: Payer: Self-pay | Admitting: Physician Assistant

## 2018-11-22 DIAGNOSIS — I509 Heart failure, unspecified: Secondary | ICD-10-CM

## 2018-11-22 DIAGNOSIS — I10 Essential (primary) hypertension: Secondary | ICD-10-CM

## 2018-11-22 DIAGNOSIS — E785 Hyperlipidemia, unspecified: Secondary | ICD-10-CM

## 2018-11-22 DIAGNOSIS — N189 Chronic kidney disease, unspecified: Secondary | ICD-10-CM

## 2018-11-22 DIAGNOSIS — E1165 Type 2 diabetes mellitus with hyperglycemia: Secondary | ICD-10-CM

## 2018-11-22 NOTE — Progress Notes (Signed)
There were no vitals taken for this visit.   Subjective:    Patient ID: Matthew Robles, male    DOB: 05-22-1956, 63 y.o.   MRN: [id number]  HPI: Matthew Robles is a 63 y.o. male presenting on 11/22/2018 for No chief complaint on file.   HPI    This is a telemedicine appointment through [employer] due to coronavirus pandemic  I connected with  Matthew Robles on 11/22/18 by a video enabled telemedicine application and verified that I am speaking with the correct person using two identifiers.   I discussed the limitations of evaluation and management by telemedicine. The patient expressed understanding and agreed to proceed.   [place]  Provider is at office  Pt was started on lantus insulin at [facility] in June.  He says is doing okay giving himself the insulin and he has no questions with administration.  He is monitoring his bs.  fbs 97 this morning.  Yesterday it was 122.   He has had no lows   Relevant past medical, surgical, family and social history reviewed and updated as indicated. Interim medical history since our last visit reviewed. Allergies and medications reviewed and updated.     Current Outpatient Medications:  .  amLODipine (NORVASC) 10 MG tablet, Take 1 tablet by mouth once daily, Disp: 60 tablet, Rfl: 0 .  aspirin EC 81 MG EC tablet, Take 1 tablet (81 mg total) by mouth daily., Disp: , Rfl:  .  cloNIDine (CATAPRES) 0.3 MG tablet, Take 1 tablet (0.3 mg total) by mouth 2 (two) times daily., Disp: 90 tablet, Rfl: 2 .  COD LIVER OIL PO, Take by mouth., Disp: , Rfl:  .  hydrALAZINE (APRESOLINE) 100 MG tablet, Take 1 tablet (100 mg total) by mouth 3 (three) times daily. (Patient taking differently: Take 50 mg by mouth every 8 (eight) hours. ), Disp: 270 tablet, Rfl: 3 .  insulin glargine (LANTUS) 100 UNIT/ML injection, Inject 10 Units into the skin daily., Disp: , Rfl:  .  labetalol (NORMODYNE) 300 MG tablet, Take 2 tablets (600 mg  total) by mouth 2 (two) times daily., Disp: 360 tablet, Rfl: 3 .  metFORMIN (GLUCOPHAGE) 1000 MG tablet, Take 1 tablet (1,000 mg total) by mouth 2 (two) times daily with a meal., Disp: 60 tablet, Rfl: 3 .  Specialty Vitamins Products (MAGNESIUM, AMINO ACID CHELATE,) 133 MG tablet, Take 1 tablet by mouth 2 (two) times daily., Disp: , Rfl:  .  atorvastatin (LIPITOR) 40 MG tablet, Take 1 tablet (40 mg total) by mouth daily at 6 PM. (Patient not taking: Reported on 11/01/2018), Disp: 90 tablet, Rfl: 0   Review of Systems  Per HPI unless specifically indicated above     Objective:    There were no vitals taken for this visit.  Wt Readings from Last 3 Encounters:  11/01/18 181 lb 12.8 oz (82.5 kg)  08/02/18 188 lb (85.3 kg)  07/11/18 181 lb 8 oz (82.3 kg)    Physical Exam Constitutional:      General: He is not in acute distress.    Appearance: Normal appearance. He is not ill-appearing.  HENT:     Head: Normocephalic and atraumatic.  Pulmonary:     Effort: Pulmonary effort is normal. No respiratory distress.  Neurological:     Mental Status: He is alert and oriented to person, place, and time.  Psychiatric:        Attention and Perception: Attention normal.  Speech: Speech normal.        Behavior: Behavior is cooperative.         Assessment & Plan:    Encounter Diagnoses  Name Primary?  Marland Kitchen Uncontrolled type 2 diabetes mellitus with hyperglycemia ([place]) Yes  . Hypertension, unspecified type   . Chronic kidney disease, unspecified CKD stage   . Congestive heart failure, unspecified HF chronicity, unspecified heart failure type ([place])   . Hyperlipidemia, unspecified hyperlipidemia type      -Pt to continue with current insulin.  He is to continue to monitor his blood sugars and contact office for fbs <70 or > 300  -pt to follow up in 3 months.  He is to contact office sooner prn

## 2018-12-17 ENCOUNTER — Other Ambulatory Visit: Payer: Self-pay | Admitting: Physician Assistant

## 2018-12-25 ENCOUNTER — Other Ambulatory Visit: Payer: Self-pay | Admitting: Physician Assistant

## 2019-01-21 ENCOUNTER — Other Ambulatory Visit: Payer: Self-pay | Admitting: Physician Assistant

## 2019-03-02 ENCOUNTER — Telehealth: Payer: Self-pay | Admitting: Cardiology

## 2019-03-02 NOTE — Telephone Encounter (Signed)

## 2019-03-07 ENCOUNTER — Other Ambulatory Visit: Payer: Self-pay | Admitting: Physician Assistant

## 2019-03-12 ENCOUNTER — Telehealth (INDEPENDENT_AMBULATORY_CARE_PROVIDER_SITE_OTHER): Payer: Self-pay | Admitting: Cardiology

## 2019-03-12 ENCOUNTER — Encounter: Payer: Self-pay | Admitting: Cardiology

## 2019-03-12 VITALS — BP 145/90 | HR 93 | Ht 72.0 in | Wt 181.2 lb

## 2019-03-12 DIAGNOSIS — I10 Essential (primary) hypertension: Secondary | ICD-10-CM

## 2019-03-12 DIAGNOSIS — I5032 Chronic diastolic (congestive) heart failure: Secondary | ICD-10-CM

## 2019-03-12 MED ORDER — LABETALOL HCL 200 MG PO TABS: 800.0000 mg | ORAL_TABLET | Freq: Two times a day (BID) | ORAL | 3 refills | Status: DC

## 2019-03-12 NOTE — Progress Notes (Signed)
Virtual Visit via Telephone Note   This visit type was conducted due to national recommendations for restrictions regarding the COVID-19 Pandemic (e.g. social distancing) in an effort to limit this patient's exposure and mitigate transmission in our community.  Due to his co-morbid illnesses, this patient is at least at moderate risk for complications without adequate follow up.  This format is felt to be most appropriate for this patient at this time.  The patient did not have access to video technology/had technical difficulties with video requiring transitioning to audio format only (telephone).  All issues noted in this document were discussed and addressed.  No physical exam could be performed with this format.  Please refer to the patient's chart for his  consent to telehealth for [facility].   Date:  03/12/2019   ID:  Matthew Robles, DOB April 15, 1956, MRN [id number]  Patient Location: Home Provider Location: Office  PCP:  Jacquelin Hawking, PA-C  Cardiologist:  Dina Rich, MD  Electrophysiologist:  None   Evaluation Performed:  Follow-Up Visit  Chief Complaint:  Follow up  History of Present Illness:    Matthew Robles is a 62 y.o. male seen today for follow up of the following medical problems.   1. Chronic diastolic HF - echo 02/2018 LVEF 50-55%, grade II diastolic dysfunction, mild RV dysfunction  - no recent SOB/DOE - no recent edema   2. HTN - admission 02/2018 with HTN emergency and SBP in 260s, had not been taking his home meds.  - presented with pulmonary edema. Mild trop elevation in setting of severe HTN and CHF.  Jan 2020 renal US: no stenosis - 07/2018 high aldo/renin ratio but aldo level was normal not consistent with hyperaldo  - home bp's 140s/90 - some constipation, fatigue from his meds. He is not sure which one, would likely suspect clonidine.    3. CKD 3 - followed by pcp and neprhologist   4. History of CVA   The patient  does not have symptoms concerning for COVID-19 infection (fever, chills, cough, or new shortness of breath).    Past Medical History:  Diagnosis Date  . Diabetes mellitus without complication (HCC)   . Hypertension   . Stroke [facility])    2017   Past Surgical History:  Procedure Laterality Date  . WISDOM TOOTH EXTRACTION       No outpatient medications have been marked as taking for the 03/12/19 encounter (Telemedicine) with Antoine Poche, MD.     Allergies:   Patient has no known allergies.   Social History   Tobacco Use  . Smoking status: Former Smoker    Packs/day: 1.00    Types: Cigarettes    Quit date: 09/15/2017    Years since quitting: 1.4  . Smokeless tobacco: Never Used  Substance Use Topics  . Alcohol use: Not Currently    Comment: a couple of times a week  . Drug use: Not Currently    Types: Marijuana    Comment: occassionally     Family Hx: The patient's family history includes Diabetes in his maternal grandfather and mother; Hypertension in his mother.  ROS:   Please see the history of present illness.     All other systems reviewed and are negative.   Prior CV studies:   The following studies were reviewed today:  02/2018 echo Study Conclusions  - Left ventricle: The cavity size was normal. Wall thickness was increased in a pattern of moderate LVH. Systolic function was normal. The estimated  ejection fraction was in the range of 50% to 55%. Wall motion was normal; there were no regional wall motion abnormalities. Features are consistent with a pseudonormal left ventricular filling pattern, with concomitant abnormal relaxation and increased filling pressure (grade 2 diastolic dysfunction). Doppler parameters are consistent with high ventricular filling pressure. - Aortic valve: Mildly calcified annulus. Trileaflet; mildly thickened leaflets. Valve area (VTI): 2.17 cm^2. Valve area (Vmax): 2.28 cm^2. - Left atrium: The  atrium was severely dilated. - Right ventricle: The cavity size was mildly dilated. Systolic function was mildly reduced. - Right atrium: The atrium was severely dilated. - Technically adequate study.  Labs/Other Tests and Data Reviewed:    EKG:  No ECG reviewed.  Recent Labs: 03/15/2018: Hemoglobin 13.1; Platelets 263 08/02/2018: TSH 2.496 10/31/2018: ALT 16; BUN 27; Creatinine, Ser 2.21; Potassium 3.6; Sodium 136   Recent Lipid Panel Lab Results  Component Value Date/Time   CHOL 145 10/31/2018 02:57 PM   TRIG 125 10/31/2018 02:57 PM   HDL 34 (L) 10/31/2018 02:57 PM   CHOLHDL 4.3 10/31/2018 02:57 PM   LDLCALC 86 10/31/2018 02:57 PM    Wt Readings from Last 3 Encounters:  03/12/19 181 lb 3.2 oz (82.2 kg)  11/01/18 181 lb 12.8 oz (82.5 kg)  08/02/18 188 lb (85.3 kg)     Objective:    Vital Signs:  BP (!) 145/90   Pulse 93   Ht 6' (1.829 m)   Wt 181 lb 3.2 oz (82.2 kg)   BMI 24.58 kg/m    Normal affect. Normal speech pattern and tone. Comfortable, no apparent distress. No audible signs of SOB or wheezing.   ASSESSMENT & PLAN:    1. HTN -above goal, increase labetolol to 800mg  bid - hopefully will be able to wean clonidine, I think this is the cause of his constipation and fatigue.  - he will all with a bp log in 1 week   2. Chronic diastolic HF/hypertensive heart disease - remains euvoelmic, continue to monitor     COVID-19 Education: The signs and symptoms of COVID-19 were discussed with the patient and how to seek care for testing (follow up with PCP or arrange E-visit).  The importance of social distancing was discussed today.  Time:   Today, I have spent 15 minutes with the patient with telehealth technology discussing the above problems.     Medication Adjustments/Labs and Tests Ordered: Current medicines are reviewed at length with the patient today.  Concerns regarding medicines are outlined above.   Tests Ordered: No orders of the defined  types were placed in this encounter.   Medication Changes: No orders of the defined types were placed in this encounter.   Follow Up:  Virtual Visit  in 3 month(s)  Signed, Carlyle Dolly, MD  03/12/2019 11:39 AM    [place]

## 2019-03-12 NOTE — Patient Instructions (Signed)
Medication Instructions: INCREASE LABETALOL TO 800 MG - TWO TIMES DAILY   Labwork: NONE  Testing/Procedures: NONE  Follow-Up: Your physician recommends that you schedule a follow-up appointment in: 3 MONTHS WITH A VIRTUAL VISIT    Any Other Special Instructions Will Be Listed Below (If Applicable).  PLEASE KEEP A BLOOD PRESSURE LOG FOR 1 WEEK AFTER INCREASING MEDICATION    If you need a refill on your cardiac medications before your next appointment, please call your pharmacy.

## 2019-03-13 ENCOUNTER — Encounter: Payer: Self-pay | Admitting: Physician Assistant

## 2019-03-13 ENCOUNTER — Ambulatory Visit: Payer: Self-pay | Admitting: Physician Assistant

## 2019-03-13 ENCOUNTER — Other Ambulatory Visit: Payer: Self-pay

## 2019-03-13 VITALS — BP 116/80 | HR 69 | Temp 97.5°F

## 2019-03-13 DIAGNOSIS — Z2821 Immunization not carried out because of patient refusal: Secondary | ICD-10-CM

## 2019-03-13 DIAGNOSIS — E1165 Type 2 diabetes mellitus with hyperglycemia: Secondary | ICD-10-CM

## 2019-03-13 DIAGNOSIS — E785 Hyperlipidemia, unspecified: Secondary | ICD-10-CM

## 2019-03-13 DIAGNOSIS — N189 Chronic kidney disease, unspecified: Secondary | ICD-10-CM

## 2019-03-13 DIAGNOSIS — I509 Heart failure, unspecified: Secondary | ICD-10-CM

## 2019-03-13 DIAGNOSIS — I10 Essential (primary) hypertension: Secondary | ICD-10-CM

## 2019-03-13 NOTE — Progress Notes (Signed)
BP 116/80   Pulse 69   Temp (!) 97.5 F (36.4 C)   SpO2 98%    Subjective:    Patient ID: Matthew Robles, male    DOB: Oct 12, 1955, 63 y.o.   MRN: [id number]  HPI: Matthew Robles is a 63 y.o. male presenting on 03/13/2019 for Diabetes, Hypertension, and Hyperlipidemia   HPI  Pt had a negative covid 19 screening questionnaire    Patient is a 63yo male with history of resistance to medication treatment, hypertension, diabetes..  He says he has been using his medications now as prescribed.    He monitors blood sugar at home.  He says his morning fasting blood sugars are running in the 112-114 range.  He says he has not had any low blood sugar levels/hypoglycemic episodes.  Pt did not get his ordered labs drawn.  Pt is getting medicare starting 06/25/19  Pt is not having any CP or sob.     Relevant past medical, surgical, family and social history reviewed and updated as indicated. Interim medical history since our last visit reviewed. Allergies and medications reviewed and updated.   Current Outpatient Medications:  .  amLODipine (NORVASC) 10 MG tablet, Take 1 tablet by mouth once daily, Disp: 30 tablet, Rfl: 2 .  aspirin EC 81 MG EC tablet, Take 1 tablet (81 mg total) by mouth daily., Disp: , Rfl:  .  cloNIDine (CATAPRES) 0.3 MG tablet, Take 1 tablet by mouth twice daily, Disp: 60 tablet, Rfl: 0 .  COD LIVER OIL PO, Take by mouth., Disp: , Rfl:  .  hydrALAZINE (APRESOLINE) 100 MG tablet, Take 1 tablet (100 mg total) by mouth 3 (three) times daily., Disp: 270 tablet, Rfl: 3 .  insulin glargine (LANTUS) 100 UNIT/ML injection, Inject 10 Units into the skin daily., Disp: , Rfl:  .  labetalol (NORMODYNE) 200 MG tablet, Take 4 tablets (800 mg total) by mouth 2 (two) times daily. (Patient taking differently: Take 600 mg by mouth 2 (two) times daily. ), Disp: 720 tablet, Rfl: 3 .  metFORMIN (GLUCOPHAGE) 1000 MG tablet, Take 1 tablet (1,000 mg total) by mouth 2 (two) times daily with  a meal., Disp: 60 tablet, Rfl: 3 .  Specialty Vitamins Products (MAGNESIUM, AMINO ACID CHELATE,) 133 MG tablet, Take 1 tablet by mouth 2 (two) times daily., Disp: , Rfl:    Review of Systems  Per HPI unless specifically indicated above     Objective:    BP 116/80   Pulse 69   Temp (!) 97.5 F (36.4 C)   SpO2 98%   Wt Readings from Last 3 Encounters:  03/12/19 181 lb 3.2 oz (82.2 kg)  11/01/18 181 lb 12.8 oz (82.5 kg)  08/02/18 188 lb (85.3 kg)    Physical Exam Vitals signs reviewed.  Constitutional:      General: He is not in acute distress.    Appearance: He is well-developed. He is not ill-appearing.  HENT:     Head: Normocephalic and atraumatic.  Neck:     Musculoskeletal: Neck supple.  Cardiovascular:     Rate and Rhythm: Normal rate and regular rhythm.  Pulmonary:     Effort: Pulmonary effort is normal.     Breath sounds: Normal breath sounds. No wheezing.  Abdominal:     General: Bowel sounds are normal.     Palpations: Abdomen is soft.     Tenderness: There is no abdominal tenderness.  Musculoskeletal:     Right lower leg: No edema.  Left lower leg: No edema.  Lymphadenopathy:     Cervical: No cervical adenopathy.  Skin:    General: Skin is warm and dry.  Neurological:     Mental Status: He is alert and oriented to person, place, and time.  Psychiatric:        Attention and Perception: Attention normal.        Speech: Speech normal.           Assessment & Plan:    Encounter Diagnoses  Name Primary?  Marland Kitchen Uncontrolled type 2 diabetes mellitus with hyperglycemia ([place]) Yes  . Chronic kidney disease, unspecified CKD stage   . Hypertension, unspecified type   . Hyperlipidemia, unspecified hyperlipidemia type   . Congestive heart failure, unspecified HF chronicity, unspecified heart failure type ([place])   . Influenza vaccination declined      -Pt to get labs drawn tomorrow morning.  He will be called with results  -pt needs to see nephrologist  for CKD.  He was referred October 2019.  From available information, pt only went one time and never returned.    -pt continues to see cardiology regularly for assistance with HTN and monitoring of chronic diastolic HF/hypertensive heart disease  -no changes to medications today.  Await results of blood tests  -Pt declined flu shot  -Patient is encouraged to wear a mask when in public to reduce risk for contracting COVID-19 per CDC guidelines.  -Discussed medicare starting 06/25/19.  He states understanding that he will need to establish care with new provider as [facility] does not accept insurance.  Pt will need to follow up with new PCP in early February.  He is to contact office sooner for any problems that may arise

## 2019-05-16 ENCOUNTER — Other Ambulatory Visit: Payer: Self-pay | Admitting: Physician Assistant

## 2019-06-13 ENCOUNTER — Encounter: Payer: Self-pay | Admitting: Cardiology

## 2019-06-13 ENCOUNTER — Telehealth (INDEPENDENT_AMBULATORY_CARE_PROVIDER_SITE_OTHER): Payer: Self-pay | Admitting: Cardiology

## 2019-06-13 VITALS — BP 160/82 | HR 72 | Ht 72.0 in | Wt 182.0 lb

## 2019-06-13 DIAGNOSIS — I10 Essential (primary) hypertension: Secondary | ICD-10-CM

## 2019-06-13 DIAGNOSIS — I5032 Chronic diastolic (congestive) heart failure: Secondary | ICD-10-CM

## 2019-06-13 MED ORDER — LABETALOL HCL 200 MG PO TABS: 800.0000 mg | ORAL_TABLET | Freq: Three times a day (TID) | ORAL | 3 refills | Status: DC

## 2019-06-13 NOTE — Progress Notes (Signed)
Virtual Visit via Telephone Note   This visit type was conducted due to national recommendations for restrictions regarding the COVID-19 Pandemic (e.g. social distancing) in an effort to limit this patient's exposure and mitigate transmission in our community.  Due to his co-morbid illnesses, this patient is at least at moderate risk for complications without adequate follow up.  This format is felt to be most appropriate for this patient at this time.  The patient did not have access to video technology/had technical difficulties with video requiring transitioning to audio format only (telephone).  All issues noted in this document were discussed and addressed.  No physical exam could be performed with this format.  Please refer to the patient's chart for his  consent to telehealth for [facility]   Date:  06/13/2019   ID:  Matthew Robles, DOB Jun 25, 1955, MRN [id number]  Patient Location: Home Provider Location: Home  PCP:  Jacquelin Hawking, PA-C  Cardiologist:  Dina Rich, MD  Electrophysiologist:  None   Evaluation Performed:  Follow-Up Visit  Chief Complaint:  Follow up  History of Present Illness:    Matthew Robles is a 64 y.o. male seen today for follow up of the following medical problems.  1. Chronic diastolic HF - echo 02/2018 LVEF 50-55%, grade II diastolic dysfunction, mild RV dysfunction  - no recent SOB or DOE - compliant with meds   2. HTN - admission 02/2018 with HTN emergency and SBP in 260s, had not been taking his home meds.  - presented with pulmonary edema. Mild trop elevation in setting of severe HTN and CHF.  Jan 2020 renal US: no stenosis - 07/2018 high aldo/renin ratio but aldo level was normal not consistent with hyperaldo   - home bp's 160s/80s - ongoing fatigue.   3. CKD 3 - followed by pcp and neprhologist   4. History of CVA  The patient does not have symptoms concerning for COVID-19 infection (fever, chills, cough,  or new shortness of breath).    Past Medical History:  Diagnosis Date  . Diabetes mellitus without complication (HCC)   . Hypertension   . Stroke [facility])    2017   Past Surgical History:  Procedure Laterality Date  . WISDOM TOOTH EXTRACTION       Current Meds  Medication Sig  . amLODipine (NORVASC) 10 MG tablet Take 1 tablet by mouth once daily  . aspirin EC 81 MG EC tablet Take 1 tablet (81 mg total) by mouth daily.  . cloNIDine (CATAPRES) 0.3 MG tablet Take 1 tablet by mouth twice daily  . COD LIVER OIL PO Take by mouth.  . hydrALAZINE (APRESOLINE) 100 MG tablet Take 1 tablet (100 mg total) by mouth 3 (three) times daily.  . insulin glargine (LANTUS) 100 UNIT/ML injection Inject 10 Units into the skin daily.  Marland Kitchen labetalol (NORMODYNE) 200 MG tablet Take 4 tablets (800 mg total) by mouth 2 (two) times daily. (Patient taking differently: Take 600 mg by mouth 2 (two) times daily. )  . metFORMIN (GLUCOPHAGE) 1000 MG tablet TAKE 1 TABLET BY MOUTH TWICE DAILY WITH MEALS  . Specialty Vitamins Products (MAGNESIUM, AMINO ACID CHELATE,) 133 MG tablet Take 1 tablet by mouth 2 (two) times daily.     Allergies:   Patient has no known allergies.   Social History   Tobacco Use  . Smoking status: Former Smoker    Packs/day: 1.00    Types: Cigarettes    Quit date: 09/15/2017    Years since quitting:  1.7  . Smokeless tobacco: Never Used  Substance Use Topics  . Alcohol use: Not Currently    Comment: a couple of times a week  . Drug use: Not Currently    Types: Marijuana    Comment: occassionally     Family Hx: The patient's family history includes Diabetes in his maternal grandfather and mother; Hypertension in his mother.  ROS:   Please see the history of present illness.     All other systems reviewed and are negative.   Prior CV studies:   The following studies were reviewed today:  02/2018 echo Study Conclusions  - Left ventricle: The cavity size was normal. Wall  thickness was increased in a pattern of moderate LVH. Systolic function was normal. The estimated ejection fraction was in the range of 50% to 55%. Wall motion was normal; there were no regional wall motion abnormalities. Features are consistent with a pseudonormal left ventricular filling pattern, with concomitant abnormal relaxation and increased filling pressure (grade 2 diastolic dysfunction). Doppler parameters are consistent with high ventricular filling pressure. - Aortic valve: Mildly calcified annulus. Trileaflet; mildly thickened leaflets. Valve area (VTI): 2.17 cm^2. Valve area (Vmax): 2.28 cm^2. - Left atrium: The atrium was severely dilated. - Right ventricle: The cavity size was mildly dilated. Systolic function was mildly reduced. - Right atrium: The atrium was severely dilated. - Technically adequate study.  Labs/Other Tests and Data Reviewed:    EKG:  n/a  Recent Labs: 08/02/2018: TSH 2.496 10/31/2018: ALT 16; BUN 27; Creatinine, Ser 2.21; Potassium 3.6; Sodium 136   Recent Lipid Panel Lab Results  Component Value Date/Time   CHOL 145 10/31/2018 02:57 PM   TRIG 125 10/31/2018 02:57 PM   HDL 34 (L) 10/31/2018 02:57 PM   CHOLHDL 4.3 10/31/2018 02:57 PM   LDLCALC 86 10/31/2018 02:57 PM    Wt Readings from Last 3 Encounters:  06/13/19 182 lb (82.6 kg)  03/12/19 181 lb 3.2 oz (82.2 kg)  11/01/18 181 lb 12.8 oz (82.5 kg)     Objective:    Vital Signs:  BP (!) 160/82   Pulse 72   Ht 6' (1.829 m)   Wt 182 lb (82.6 kg)   BMI 24.68 kg/m    Normal affrect. Normal speech pattern and tone. Comfortable, no apparent distress. No audible signs of SOB or wheezing.   ASSESSMENT & PLAN:    1. HTN -remains above goal, increase labetalol to 800mg  tid - options limited due to renal dysfunction - hopefully will be able to wean clonidine, I think this is the cause of his constipation and fatigue.  - f/u 3 weeks reassess bp's and fatigue,  potentially wean clonidine    2. Chronic diastolic HF/hypertensive heart disease -no symptoms, conitnue to monitor  COVID-19 Education: The signs and symptoms of COVID-19 were discussed with the patient and how to seek care for testing (follow up with PCP or arrange E-visit).  The importance of social distancing was discussed today.  Time:   Today, I have spent 13 minutes with the patient with telehealth technology discussing the above problems.     Medication Adjustments/Labs and Tests Ordered: Current medicines are reviewed at length with the patient today.  Concerns regarding medicines are outlined above.   Tests Ordered: No orders of the defined types were placed in this encounter.   Medication Changes: No orders of the defined types were placed in this encounter.   Follow Up:  Virtual Visit  in 3 week(s)  Signed, Vidal Lampkins,  Roderic Palau, MD  06/13/2019 11:37 AM    [place]

## 2019-06-13 NOTE — Patient Instructions (Signed)
Medication Instructions:  TAKE LABETALOL 800 MG (4 TABLETS) THREE (3) TIMES DAILY     Labwork: NONE  Testing/Procedures: NONE  Follow-Up: Your physician recommends that you schedule a follow-up appointment in: 3 WEEKS    Any Other Special Instructions Will Be Listed Below (If Applicable).     If you need a refill on your cardiac medications before your next appointment, please call your pharmacy.

## 2019-06-13 NOTE — Addendum Note (Signed)
Addended byAbelino Derrick R on: 06/13/2019 12:08 PM   Modules accepted: Orders

## 2019-06-15 ENCOUNTER — Telehealth: Payer: Self-pay | Admitting: Cardiology

## 2019-06-23 IMAGING — CR DG CHEST 1V PORT
1 series · 1 of 1 positions shown · non-contrast
Comparison: Prior radiograph from 01/15/2016.

CLINICAL DATA: Initial evaluation for acute shortness of breath.

EXAM:
PORTABLE CHEST 1 VIEW

[portable]
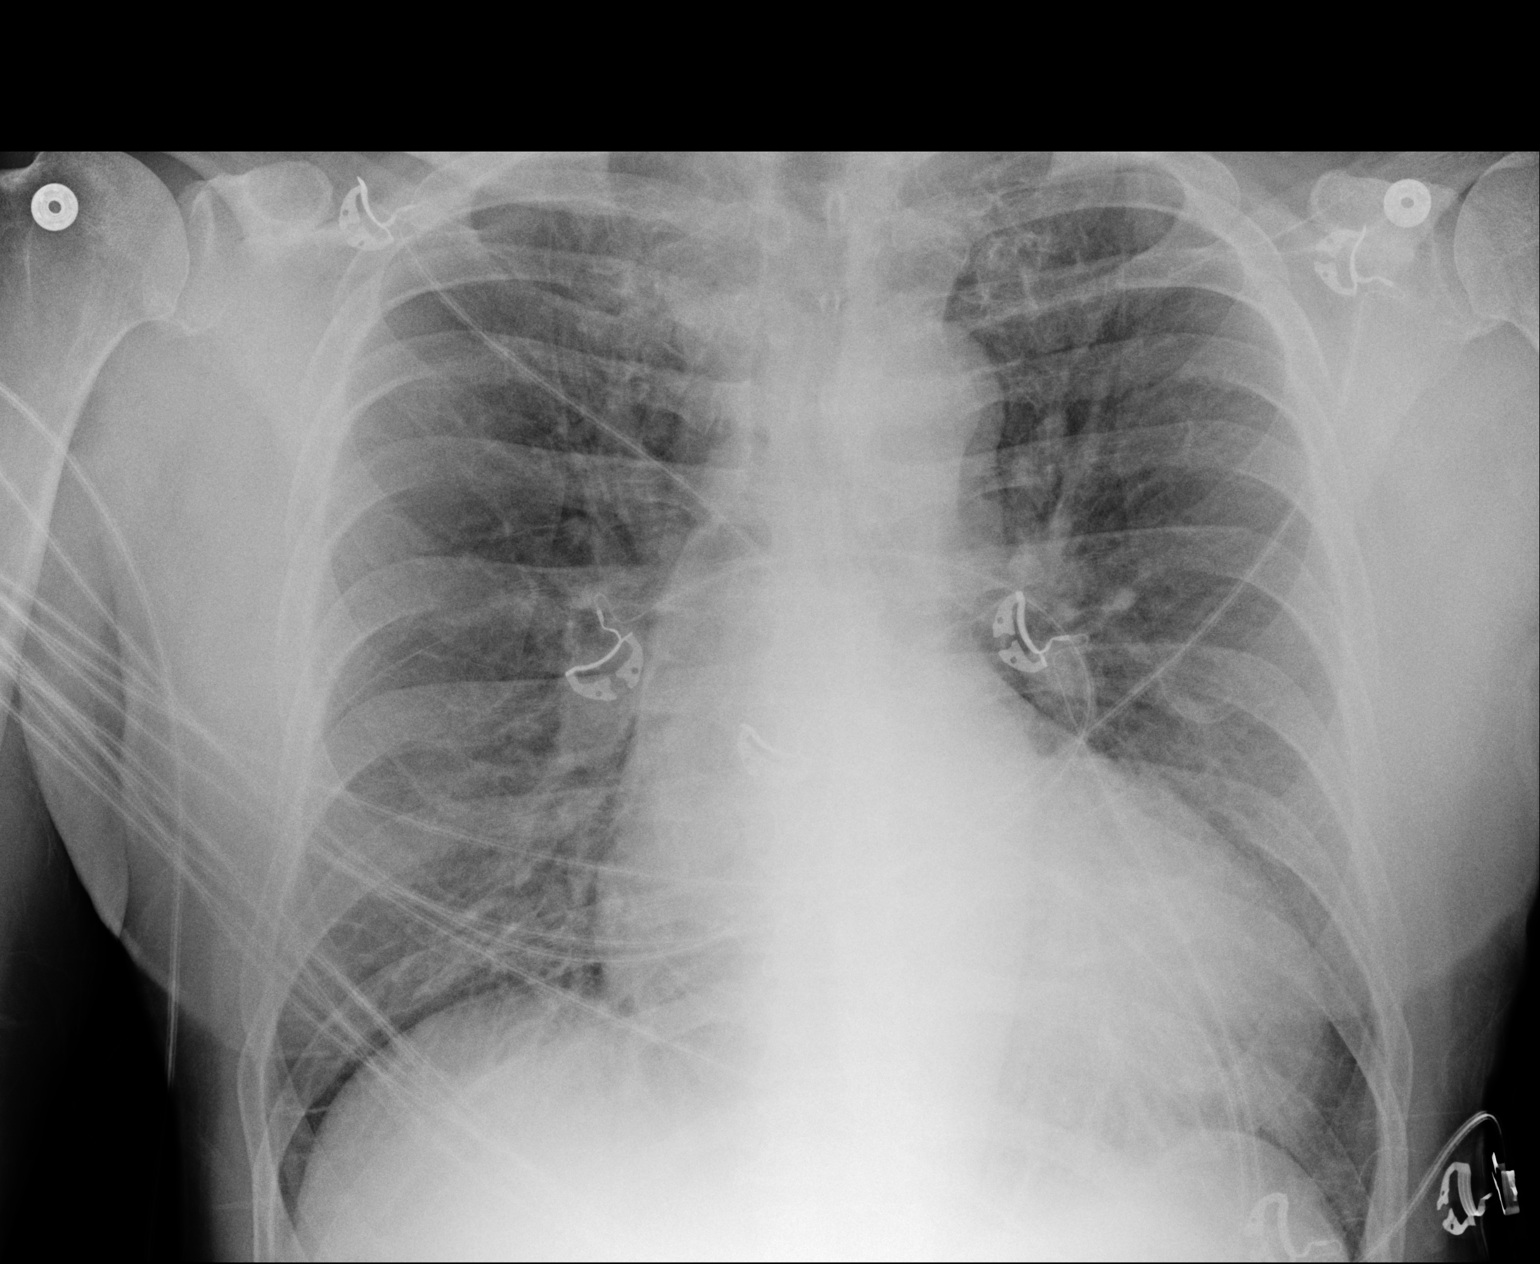

[1 of 1 positions shown; findings below may reference images not displayed]

FINDINGS: Cardiomegaly, stable from previous.  Mediastinal silhouette normal.

Lungs normally inflated. Diffuse vascular congestion with
interstitial prominence, consistent with pulmonary interstitial
edema. No pleural effusion. No consolidative airspace disease. No
pneumothorax.

Osseous structures within normal limits.
IMPRESSION: Cardiomegaly with mild diffuse pulmonary interstitial edema.

## 2019-07-04 ENCOUNTER — Telehealth: Payer: Self-pay | Admitting: Cardiology

## 2019-07-11 ENCOUNTER — Encounter: Payer: Self-pay | Admitting: Cardiology

## 2019-07-11 ENCOUNTER — Telehealth (INDEPENDENT_AMBULATORY_CARE_PROVIDER_SITE_OTHER): Payer: Self-pay | Admitting: Cardiology

## 2019-07-11 VITALS — BP 150/83 | HR 79 | Ht 71.0 in | Wt 181.0 lb

## 2019-07-11 DIAGNOSIS — I5032 Chronic diastolic (congestive) heart failure: Secondary | ICD-10-CM

## 2019-07-11 DIAGNOSIS — I10 Essential (primary) hypertension: Secondary | ICD-10-CM

## 2019-07-11 MED ORDER — LABETALOL HCL 200 MG PO TABS: 800.0000 mg | ORAL_TABLET | Freq: Three times a day (TID) | ORAL | 3 refills | Status: DC

## 2019-07-11 MED ORDER — FUROSEMIDE 40 MG PO TABS: 40.0000 mg | ORAL_TABLET | Freq: Every day | ORAL | 0 refills | Status: DC

## 2019-07-11 NOTE — Progress Notes (Signed)
Virtual Visit via Telephone Note   This visit type was conducted due to national recommendations for restrictions regarding the COVID-19 Pandemic (e.g. social distancing) in an effort to limit this patient's exposure and mitigate transmission in our community.  Due to his co-morbid illnesses, this patient is at least at moderate risk for complications without adequate follow up.  This format is felt to be most appropriate for this patient at this time.  The patient did not have access to video technology/had technical difficulties with video requiring transitioning to audio format only (telephone).  All issues noted in this document were discussed and addressed.  No physical exam could be performed with this format.  Please refer to the patient's chart for his  consent to telehealth for [facility].   Date:  07/11/2019   ID:  Matthew Robles, DOB 22-Dec-1955, MRN [id number]  Patient Location: Home Provider Location: Office  PCP:  No primary care provider on file.  Cardiologist:  Carlyle Dolly, MD  Electrophysiologist:  None   Evaluation Performed:  Follow-Up Visit  Chief Complaint:  Follow up visit  History of Present Illness:    Matthew Robles is a 64 y.o. male male seen today for follow up of the following medical problems.This is a focused visit on recent issues with HTN.    1. HTN - admission 02/2018 with HTN emergency and SBP in 260s, had not been taking his home meds.  - presented with pulmonary edema. Mild trop elevation in setting of severe HTN and CHF.  Jan 2020 renal US: no stenosis - 07/2018 high aldo/renin ratio but aldo level was normal not consistent with hyperaldo   - last visit we increased labetalol to 800mg  tid - home bp's remain elevated 150s/80s    The patient does not have symptoms concerning for COVID-19 infection (fever, chills, cough, or new shortness of breath).    Past Medical History:  Diagnosis Date  . Diabetes mellitus without  complication ([place])   . Hypertension   . Stroke [facility])    2017   Past Surgical History:  Procedure Laterality Date  . WISDOM TOOTH EXTRACTION       No outpatient medications have been marked as taking for the 07/11/19 encounter (Appointment) with Arnoldo Lenis, MD.     Allergies:   Patient has no known allergies.   Social History   Tobacco Use  . Smoking status: Former Smoker    Packs/day: 1.00    Types: Cigarettes    Quit date: 09/15/2017    Years since quitting: 1.8  . Smokeless tobacco: Never Used  Substance Use Topics  . Alcohol use: Not Currently    Comment: a couple of times a week  . Drug use: Not Currently    Types: Marijuana    Comment: occassionally     Family Hx: The patient's family history includes Diabetes in his maternal grandfather and mother; Hypertension in his mother.  ROS:   Please see the history of present illness.     All other systems reviewed and are negative.   Prior CV studies:   The following studies were reviewed today:  02/2018 echo Study Conclusions  - Left ventricle: The cavity size was normal. Wall thickness was increased in a pattern of moderate LVH. Systolic function was normal. The estimated ejection fraction was in the range of 50% to 55%. Wall motion was normal; there were no regional wall motion abnormalities. Features are consistent with a pseudonormal left ventricular filling pattern, with concomitant abnormal  relaxation and increased filling pressure (grade 2 diastolic dysfunction). Doppler parameters are consistent with high ventricular filling pressure. - Aortic valve: Mildly calcified annulus. Trileaflet; mildly thickened leaflets. Valve area (VTI): 2.17 cm^2. Valve area (Vmax): 2.28 cm^2. - Left atrium: The atrium was severely dilated. - Right ventricle: The cavity size was mildly dilated. Systolic function was mildly reduced. - Right atrium: The atrium was severely dilated. -  Technically adequate study.  Labs/Other Tests and Data Reviewed:    EKG:  No ECG reviewed.  Recent Labs: 08/02/2018: TSH 2.496 10/31/2018: ALT 16; BUN 27; Creatinine, Ser 2.21; Potassium 3.6; Sodium 136   Recent Lipid Panel Lab Results  Component Value Date/Time   CHOL 145 10/31/2018 02:57 PM   TRIG 125 10/31/2018 02:57 PM   HDL 34 (L) 10/31/2018 02:57 PM   CHOLHDL 4.3 10/31/2018 02:57 PM   LDLCALC 86 10/31/2018 02:57 PM    Wt Readings from Last 3 Encounters:  06/13/19 182 lb (82.6 kg)  03/12/19 181 lb 3.2 oz (82.2 kg)  11/01/18 181 lb 12.8 oz (82.5 kg)     Objective:    Vital Signs:   Today's Vitals   07/11/19 0833  BP: (!) 150/83  Pulse: 79  Weight: 181 lb (82.1 kg)  Height: 5\' 11"  (1.803 m)   Body mass index is 25.24 kg/m.   ASSESSMENT & PLAN:    1. HTN -remains above goal, options limited due to renal dysfunction - I think a diuretic would help get his bp under control, due to his GFR thiazide diuretic less effective, start lasix 40mg  daily - bmet/mg in 2 weeks.     COVID-19 Education: The signs and symptoms of COVID-19 were discussed with the patient and how to seek care for testing (follow up with PCP or arrange E-visit).  The importance of social distancing was discussed today.  Time:   Today, I have spent 22 minutes with the patient with telehealth technology discussing the above problems.     Medication Adjustments/Labs and Tests Ordered: Current medicines are reviewed at length with the patient today.  Concerns regarding medicines are outlined above.   Tests Ordered: No orders of the defined types were placed in this encounter.   Medication Changes: No orders of the defined types were placed in this encounter.   Follow Up:  Virtual Visit  in 1 month(s)  Signed, , MD  07/11/2019 7:55 AM     [facility]

## 2019-07-11 NOTE — Patient Instructions (Signed)
Medication Instructions:  Start lasix 40 mg daily   Labwork: 2 weeks  bmet magnesium  Testing/Procedures: none  Follow-Up: Your physician recommends that you schedule a follow-up appointment in: 1 month    Any Other Special Instructions Will Be Listed Below (If Applicable).     If you need a refill on your cardiac medications before your next appointment, please call your pharmacy.

## 2019-07-11 NOTE — Addendum Note (Signed)
Addended by: Abelino Derrick R on: 07/11/2019 10:38 AM   Modules accepted: Orders

## 2019-08-14 ENCOUNTER — Encounter: Payer: Self-pay | Admitting: Cardiology

## 2019-08-14 ENCOUNTER — Telehealth (INDEPENDENT_AMBULATORY_CARE_PROVIDER_SITE_OTHER): Payer: Self-pay | Admitting: Cardiology

## 2019-08-14 VITALS — BP 148/90 | HR 81 | Ht 71.0 in | Wt 173.0 lb

## 2019-08-14 DIAGNOSIS — R7309 Other abnormal glucose: Secondary | ICD-10-CM

## 2019-08-14 DIAGNOSIS — I1A0 Resistant hypertension: Secondary | ICD-10-CM

## 2019-08-14 DIAGNOSIS — I10 Essential (primary) hypertension: Secondary | ICD-10-CM

## 2019-08-14 DIAGNOSIS — E782 Mixed hyperlipidemia: Secondary | ICD-10-CM

## 2019-08-14 DIAGNOSIS — R002 Palpitations: Secondary | ICD-10-CM

## 2019-08-14 NOTE — Addendum Note (Signed)
Addended by: Abelino Derrick R on: 08/14/2019 03:53 PM   Modules accepted: Orders

## 2019-08-14 NOTE — Patient Instructions (Signed)
Medication Instructions:  Your physician recommends that you continue on your current medications as directed. Please refer to the Current Medication list given to you today.  Labwork: ASAP    Testing/Procedures: NONE  Follow-Up: Your physician recommends that you schedule a follow-up appointment in: 6 WEEKS    Any Other Special Instructions Will Be Listed Below (If Applicable).     If you need a refill on your cardiac medications before your next appointment, please call your pharmacy.

## 2019-08-14 NOTE — Progress Notes (Signed)
Virtual Visit via Telephone Note   This visit type was conducted due to national recommendations for restrictions regarding the COVID-19 Pandemic (e.g. social distancing) in an effort to limit this patient's exposure and mitigate transmission in our community.  Due to his co-morbid illnesses, this patient is at least at moderate risk for complications without adequate follow up.  This format is felt to be most appropriate for this patient at this time.  The patient did not have access to video technology/had technical difficulties with video requiring transitioning to audio format only (telephone).  All issues noted in this document were discussed and addressed.  No physical exam could be performed with this format.  Please refer to the patient's chart for his  consent to telehealth for [facility]   The patient was identified using 2 identifiers.  Date:  08/14/2019   ID:  Matthew Robles, DOB 04/15/1956, MRN [id number]  Patient Location: Home Provider Location: Office  PCP:  Patient, No Pcp Per  Cardiologist:  Dina Rich, MD  Electrophysiologist:  None   Evaluation Performed:  Follow-Up Visit  Chief Complaint:  Follow up visit  History of Present Illness:    Matthew Robles is a 64 y.o. male This is a focused visit on recent issues with HTN.    1. HTN - admission 02/2018 with HTN emergency and SBP in 260s, had not been taking his home meds.  - presented with pulmonary edema. Mild trop elevation in setting of severe HTN and CHF.  Jan 2020 renal US: no stenosis - 07/2018 high aldo/renin ratio but aldo level was normal not consistent with hyperaldo ?OSA   - home sbp's 140s.  - last visit we started lasix 40mg  daily. He did not got for labs. Has not followed up with pcp in some time  - off clonidine due to constipation, fatigue.   - no snoring, no apneic episodes, no day time somnolence.    The patient does not have symptoms concerning for COVID-19 infection  (fever, chills, cough, or new shortness of breath).    Past Medical History:  Diagnosis Date  . Diabetes mellitus without complication ([facility])   . Hypertension   . Stroke [place])    2017   Past Surgical History:  Procedure Laterality Date  . WISDOM TOOTH EXTRACTION       Current Meds  Medication Sig  . amLODipine (NORVASC) 10 MG tablet Take 1 tablet by mouth once daily  . aspirin EC 81 MG EC tablet Take 1 tablet (81 mg total) by mouth daily.  . COD LIVER OIL PO Take by mouth.  . furosemide (LASIX) 40 MG tablet Take 1 tablet (40 mg total) by mouth daily.  . hydrALAZINE (APRESOLINE) 100 MG tablet Take 1 tablet (100 mg total) by mouth 3 (three) times daily.  . insulin glargine (LANTUS) 100 UNIT/ML injection Inject 10 Units into the skin daily.  2018 labetalol (NORMODYNE) 200 MG tablet Take 4 tablets (800 mg total) by mouth 3 (three) times daily.  . metFORMIN (GLUCOPHAGE) 1000 MG tablet TAKE 1 TABLET BY MOUTH TWICE DAILY WITH MEALS  . Specialty Vitamins Products (MAGNESIUM, AMINO ACID CHELATE,) 133 MG tablet Take 1 tablet by mouth 2 (two) times daily.     Allergies:   Patient has no known allergies.   Social History   Tobacco Use  . Smoking status: Former Smoker    Packs/day: 1.00    Types: Cigarettes    Quit date: 09/15/2017    Years since quitting: 1.9  .  Smokeless tobacco: Never Used  Substance Use Topics  . Alcohol use: Not Currently    Comment: a couple of times a week  . Drug use: Not Currently    Types: Marijuana    Comment: occassionally     Family Hx: The patient's family history includes Diabetes in his maternal grandfather and mother; Hypertension in his mother.  ROS:   Please see the history of present illness.     All other systems reviewed and are negative.   Prior CV studies:   The following studies were reviewed today:  02/2018 echo Study Conclusions  - Left ventricle: The cavity size was normal. Wall thickness was increased in a pattern of  moderate LVH. Systolic function was normal. The estimated ejection fraction was in the range of 50% to 55%. Wall motion was normal; there were no regional wall motion abnormalities. Features are consistent with a pseudonormal left ventricular filling pattern, with concomitant abnormal relaxation and increased filling pressure (grade 2 diastolic dysfunction). Doppler parameters are consistent with high ventricular filling pressure. - Aortic valve: Mildly calcified annulus. Trileaflet; mildly thickened leaflets. Valve area (VTI): 2.17 cm^2. Valve area (Vmax): 2.28 cm^2. - Left atrium: The atrium was severely dilated. - Right ventricle: The cavity size was mildly dilated. Systolic function was mildly reduced. - Right atrium: The atrium was severely dilated. - Technically adequate study.  Labs/Other Tests and Data Reviewed:    EKG:  No ECG reviewed.  Recent Labs: 10/31/2018: ALT 16; BUN 27; Creatinine, Ser 2.21; Potassium 3.6; Sodium 136   Recent Lipid Panel Lab Results  Component Value Date/Time   CHOL 145 10/31/2018 02:57 PM   TRIG 125 10/31/2018 02:57 PM   HDL 34 (L) 10/31/2018 02:57 PM   CHOLHDL 4.3 10/31/2018 02:57 PM   LDLCALC 86 10/31/2018 02:57 PM    Wt Readings from Last 3 Encounters:  08/14/19 173 lb (78.5 kg)  07/11/19 181 lb (82.1 kg)  06/13/19 182 lb (82.6 kg)     Objective:    Vital Signs:  BP (!) 148/90   Pulse 81   Ht 5\' 11"  (1.803 m)   Wt 173 lb (78.5 kg)   BMI 24.13 kg/m    Normal affect. Normal speech pattern and tone. Comfortable, no apparent distress. No audible signs of SOB or wheezing.   ASSESSMENT & PLAN:     1. HTN -remains above goal, options limited due to renal dysfunction -may increase diuretic dose. Need to repeat labs before adjusting, will order labs - I thnk his HTN may be just from advanced kidney disease. No signs of renal arter stenosis or hyperaldo, his BMI is 24 and screening questions for OSA are  negative      COVID-19 Education: The signs and symptoms of COVID-19 were discussed with the patient and how to seek care for testing (follow up with PCP or arrange E-visit).  The importance of social distancing was discussed today.  Time:   Today, I have spent 21 minutes with the patient with telehealth technology discussing the above problems.     Medication Adjustments/Labs and Tests Ordered: Current medicines are reviewed at length with the patient today.  Concerns regarding medicines are outlined above.   Tests Ordered: No orders of the defined types were placed in this encounter.   Medication Changes: No orders of the defined types were placed in this encounter.   Follow Up:  In Person in 6 week(s)  Signed, Carlyle Dolly, MD  08/14/2019 1:03 PM    [facility]

## 2019-09-21 ENCOUNTER — Other Ambulatory Visit: Payer: Self-pay

## 2019-09-21 ENCOUNTER — Encounter: Payer: Self-pay | Admitting: Cardiology

## 2019-09-21 ENCOUNTER — Ambulatory Visit (INDEPENDENT_AMBULATORY_CARE_PROVIDER_SITE_OTHER): Payer: Self-pay | Admitting: Cardiology

## 2019-09-21 VITALS — BP 152/98 | HR 82 | Temp 97.4°F | Ht 72.0 in | Wt 181.0 lb

## 2019-09-21 DIAGNOSIS — I10 Essential (primary) hypertension: Secondary | ICD-10-CM

## 2019-09-21 DIAGNOSIS — Z79899 Other long term (current) drug therapy: Secondary | ICD-10-CM

## 2019-09-21 DIAGNOSIS — I5032 Chronic diastolic (congestive) heart failure: Secondary | ICD-10-CM

## 2019-09-21 NOTE — Progress Notes (Signed)
Clinical Summary Matthew Robles is a 64 y.o.male seen today for follow up of the following medical problems.    1. HTN - admission 02/2018 with HTN emergency and SBP in 260s, had not been taking his home meds.  - presented with pulmonary edema. Mild trop elevation in setting of severe HTN and CHF.  Jan 2020 renal US: no stenosis - 07/2018 high aldo/renin ratio but aldo level was normal not consistent with hyperaldo - OSA screening questions normal, BMI 24  me  - off clonidine due to constipation, fatigue.  - compliant with meds   2. Chronic diastolic HF - echo 51/7616 LVEF 50-55%, grade II diastolic dysfunction, mild RV dysfunction  - no recent symptoms. No SOB or DOE.     3. CKD 3 - has not seen nephrology in some time   4. History of CVA Past Medical History:  Diagnosis Date  . Diabetes mellitus without complication (Matthew Robles)   . Hypertension   . Stroke Matthew Robles)    2017     No Known Allergies   Current Outpatient Medications  Medication Sig Dispense Refill  . amLODipine (NORVASC) 10 MG tablet Take 1 tablet by mouth once daily 30 tablet 2  . aspirin EC 81 MG EC tablet Take 1 tablet (81 mg total) by mouth daily.    . COD LIVER OIL PO Take by mouth.    . furosemide (LASIX) 40 MG tablet Take 1 tablet (40 mg total) by mouth daily. 90 tablet 0  . hydrALAZINE (APRESOLINE) 100 MG tablet Take 1 tablet (100 mg total) by mouth 3 (three) times daily. 270 tablet 3  . insulin glargine (LANTUS) 100 UNIT/ML injection Inject 10 Units into the skin daily.    Marland Kitchen labetalol (NORMODYNE) 200 MG tablet Take 4 tablets (800 mg total) by mouth 3 (three) times daily. 1080 tablet 3  . metFORMIN (GLUCOPHAGE) 1000 MG tablet TAKE 1 TABLET BY MOUTH TWICE DAILY WITH MEALS 60 tablet 1  . Specialty Vitamins Products (MAGNESIUM, AMINO ACID CHELATE,) 133 MG tablet Take 1 tablet by mouth 2 (two) times daily.     No current facility-administered medications for this visit.     Past  Surgical History:  Procedure Laterality Date  . WISDOM TOOTH EXTRACTION       No Known Allergies    Family History  Problem Relation Age of Onset  . Diabetes Mother   . Hypertension Mother   . Diabetes Maternal Grandfather      Social History [name] reports that he quit smoking about 2 years ago. His smoking use included cigarettes. He smoked 1.00 pack per day. He has never used smokeless tobacco. [name] reports current alcohol use.   Review of Systems CONSTITUTIONAL: No weight loss, fever, chills, weakness or fatigue.  HEENT: Eyes: No visual loss, blurred vision, double vision or yellow sclerae.No hearing loss, sneezing, congestion, runny nose or sore throat.  SKIN: No rash or itching.  CARDIOVASCULAR: per hpi RESPIRATORY: No shortness of breath, cough or sputum.  GASTROINTESTINAL: No anorexia, nausea, vomiting or diarrhea. No abdominal pain or blood.  GENITOURINARY: No burning on urination, no polyuria NEUROLOGICAL: No headache, dizziness, syncope, paralysis, ataxia, numbness or tingling in the extremities. No change in bowel or bladder control.  MUSCULOSKELETAL: No muscle, back pain, joint pain or stiffness.  LYMPHATICS: No enlarged nodes. No history of splenectomy.  PSYCHIATRIC: No history of depression or anxiety.  ENDOCRINOLOGIC: No reports of sweating, cold or heat intolerance. No polyuria or polydipsia.  Marland Kitchen  Physical Examination Vitals:   09/21/19 1419  BP: (!) 152/98  Pulse: 82  Temp: (!) 97.4 F (36.3 C)  SpO2: 96%   Filed Weights   09/21/19 1419  Weight: 181 lb (82.1 kg)    Gen: resting comfortably, no acute distress HEENT: no scleral icterus, pupils equal round and reactive, no palptable cervical adenopathy,  CV: RRR, no m/r/g, no jvd Resp: Clear to auscultation bilaterally GI: abdomen is soft, non-tender, non-distended, normal bowel sounds, no hepatosplenomegaly MSK: extremities are warm, no edema.  Skin: warm, no rash Neuro:  no  focal deficits Psych: appropriate affect   Diagnostic Studies 02/2018 echo Study Conclusions  - Left ventricle: The cavity size was normal. Wall thickness was increased in a pattern of moderate LVH. Systolic function was normal. The estimated ejection fraction was in the range of 50% to 55%. Wall motion was normal; there were no regional wall motion abnormalities. Features are consistent with a pseudonormal left ventricular filling pattern, with concomitant abnormal relaxation and increased filling pressure (grade 2 diastolic dysfunction). Doppler parameters are consistent with high ventricular filling pressure. - Aortic valve: Mildly calcified annulus. Trileaflet; mildly thickened leaflets. Valve area (VTI): 2.17 cm^2. Valve area (Vmax): 2.28 cm^2. - Left atrium: The atrium was severely dilated. - Right ventricle: The cavity size was mildly dilated. Systolic function was mildly reduced. - Right atrium: The atrium was severely dilated. - Technically adequate study.    Assessment and Plan   1. Resistant HTN -remains above goal,options limited due to renal dysfunction -repeat labs, pending results could consider adding aldactone or higher diuretic dosing  2. Chronic diastolic HF - no current symptoms, continue current meds     F/u 4 months. Given list of local pcps. Penindg lab results may need to refer back to neprhology   Matthew Robles, M.D.

## 2019-09-21 NOTE — Patient Instructions (Signed)
Medication Instructions:  Your physician recommends that you continue on your current medications as directed. Please refer to the Current Medication list given to you today.  *If you need a refill on your cardiac medications before your next appointment, please call your pharmacy*   Lab Work: Your physician recommends that you return for lab work in: Fasting   If you have labs (blood work) drawn today and your tests are completely normal, you will receive your results only by: Marland Kitchen MyChart Message (if you have MyChart) OR . A paper copy in the mail If you have any lab test that is abnormal or we need to change your treatment, we will call you to review the results.   Testing/Procedures: NONE    Follow-Up: At [facility], you and your health needs are our priority.  As part of our continuing mission to provide you with exceptional heart care, we have created designated Provider Care Teams.  These Care Teams include your primary Cardiologist (physician) and Advanced Practice Providers (APPs -  Physician Assistants and Nurse Practitioners) who all work together to provide you with the care you need, when you need it.  We recommend signing up for the patient portal called "MyChart".  Sign up information is provided on this After Visit Summary.  MyChart is used to connect with patients for Virtual Visits (Telemedicine).  Patients are able to view lab/test results, encounter notes, upcoming appointments, etc.  Non-urgent messages can be sent to your provider as well.   To learn more about what you can do with MyChart, go to [email or web address].    Your next appointment:   4 month(s)  The format for your next appointment:   In Person  Provider:   Dina Rich, MD   Other Instructions Thank you for choosing [facility]!

## 2019-09-21 NOTE — Addendum Note (Signed)
Addended by: Kerney Elbe on: 09/21/2019 03:18 PM   Modules accepted: Orders

## 2019-09-26 ENCOUNTER — Other Ambulatory Visit: Payer: Self-pay | Admitting: Physician Assistant

## 2019-10-11 IMAGING — US US RENAL
1 series · 14 of 25 positions shown · non-contrast
Comparison: None.

CLINICAL DATA: Acute on chronic renal failure.

EXAM:
RENAL / URINARY TRACT ULTRASOUND COMPLETE

[Series 1: us renal · 0.22mm/px · 14 of 34 slices shown]
[im 1/34]
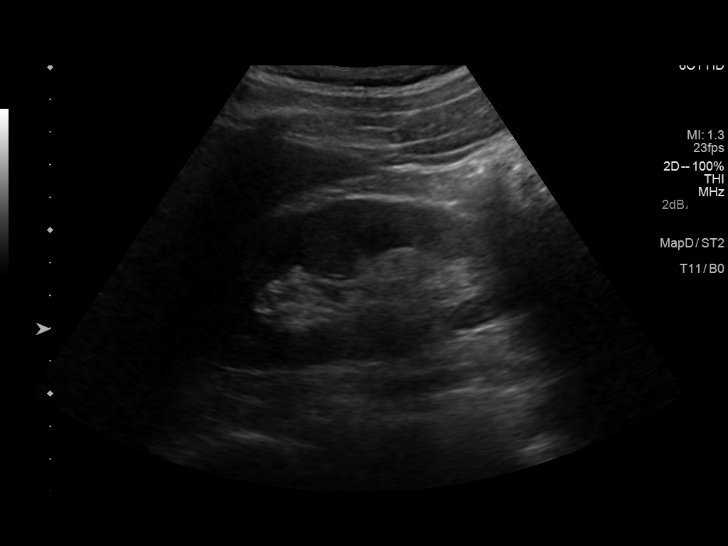
[im 3/34]
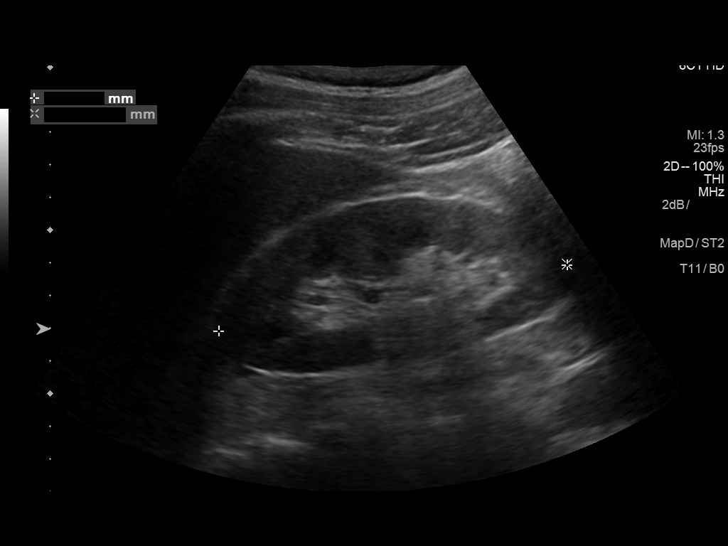
[im 6/34]
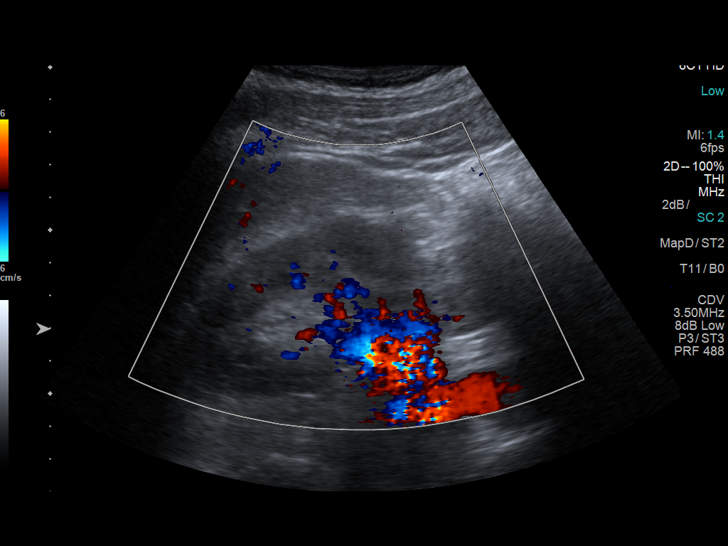
[im 9/34]
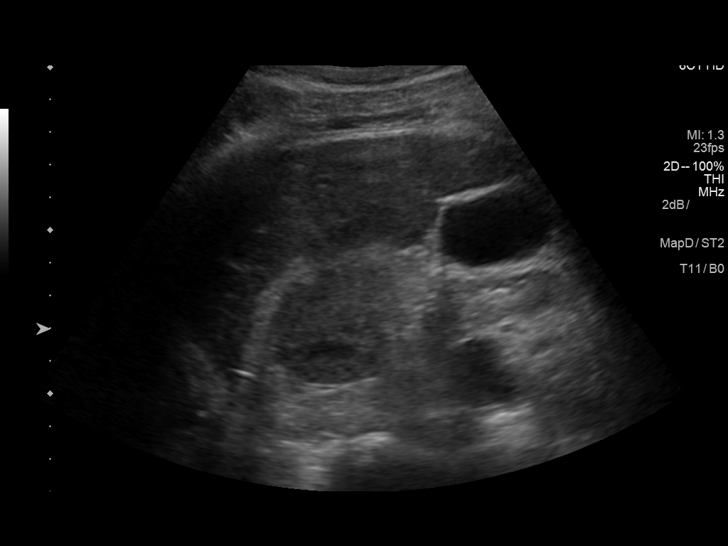
[im 12/34]
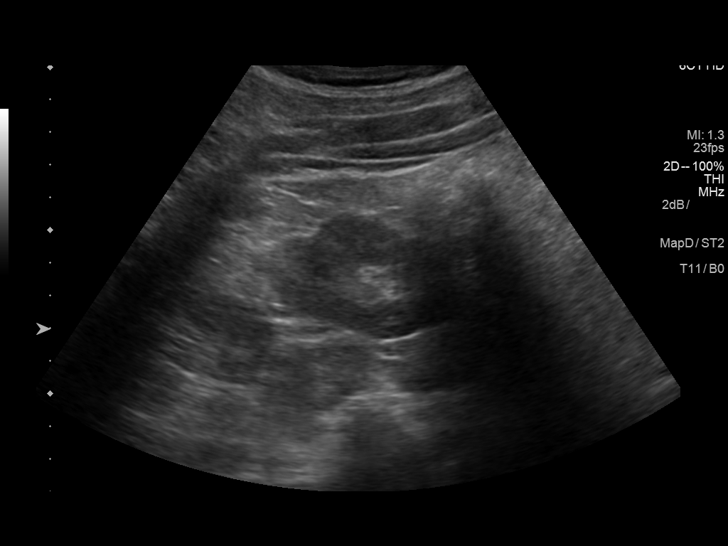
[im 13/34]
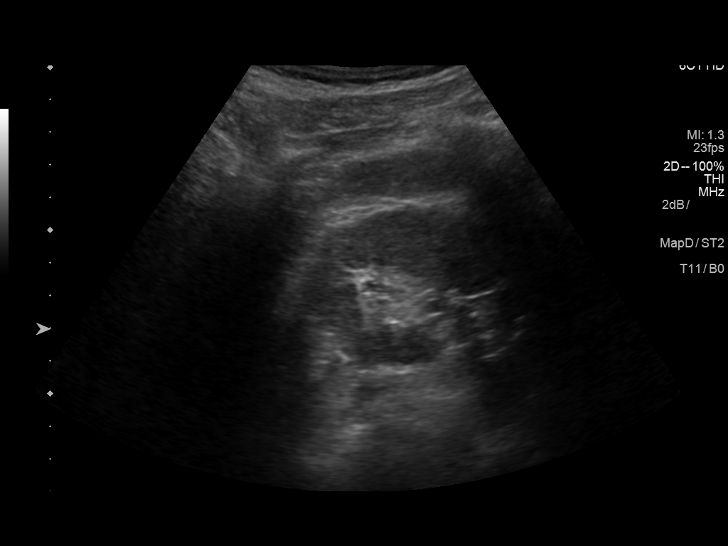
[im 16/34]
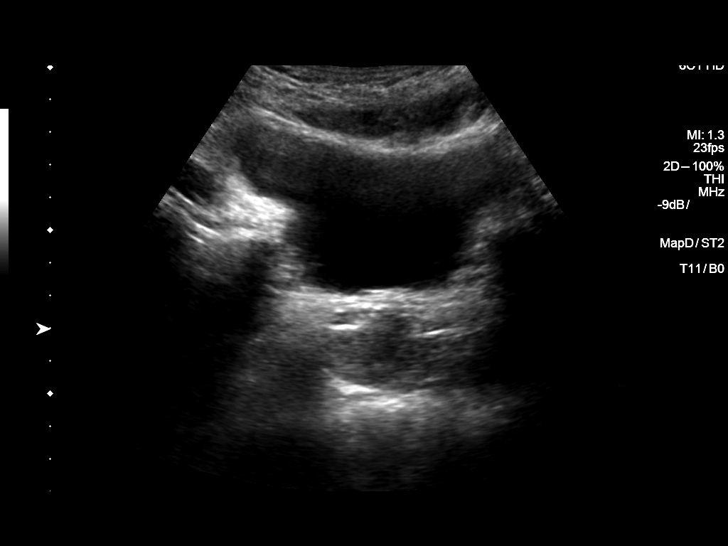
[im 18/34]
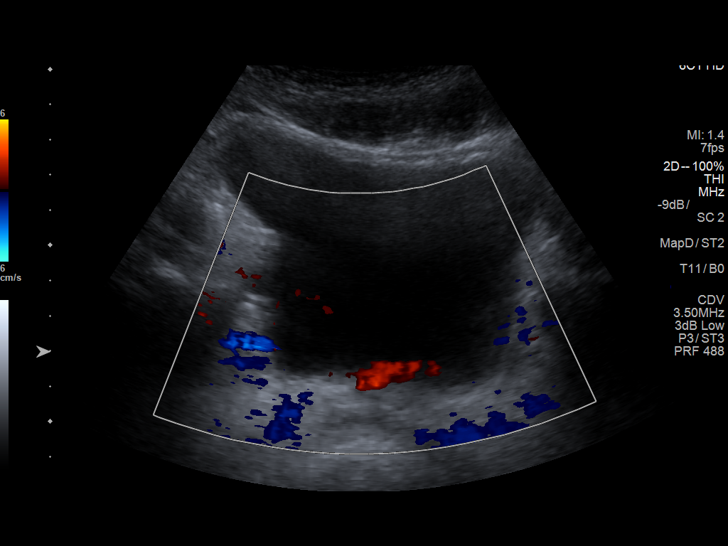
[im 21/34]
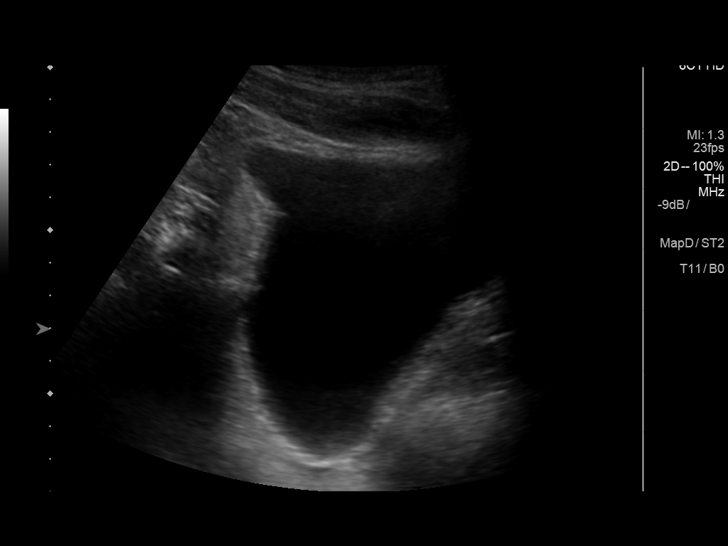
[im 23/34]
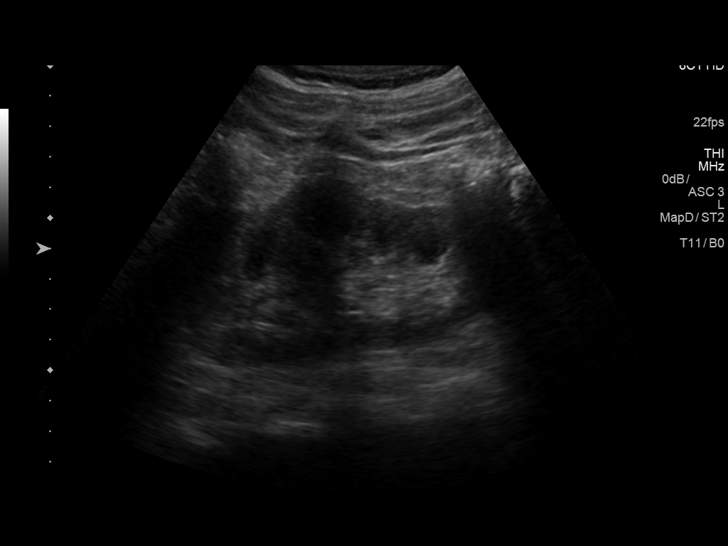
[im 25/34]
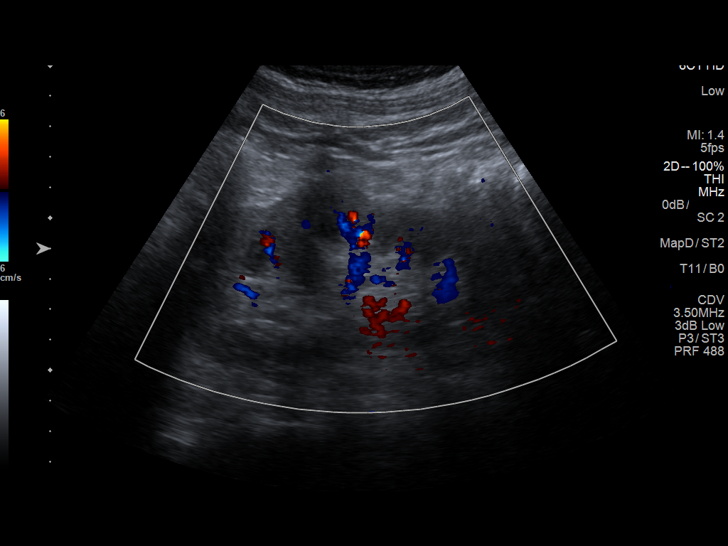
[im 28/34]
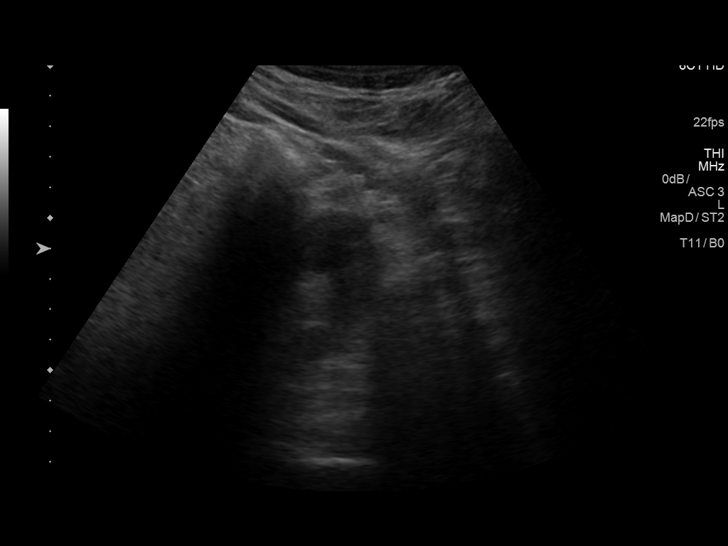
[im 31/34]
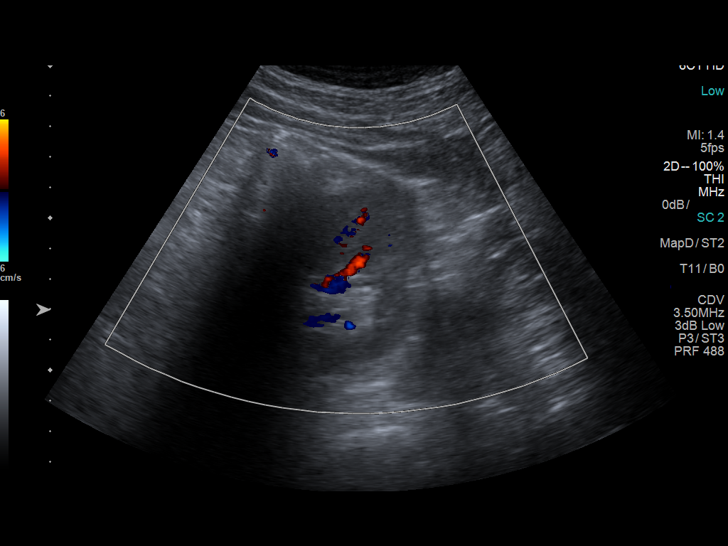
[im 34/34]
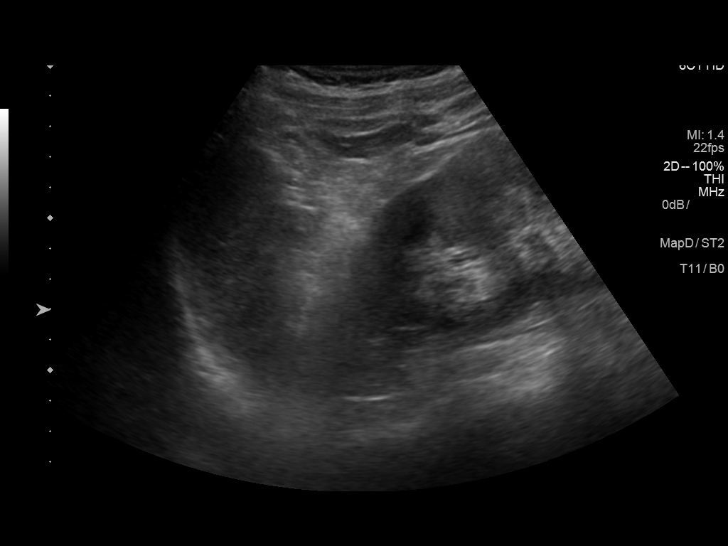

[14 of 25 positions shown; findings below may reference images not displayed]

FINDINGS: Right Kidney:

Length: 10.9 cm. Echogenicity within normal limits. No mass or
hydronephrosis visualized.

Left Kidney:

Length: 11.7 cm. Echogenicity within normal limits. No mass or
hydronephrosis visualized.

Bladder:

Appears normal for degree of bladder distention.
IMPRESSION: Unremarkable renal ultrasound.

## 2020-01-17 ENCOUNTER — Ambulatory Visit: Payer: Self-pay | Admitting: Cardiology

## 2020-06-20 ENCOUNTER — Encounter: Payer: Self-pay | Admitting: Cardiology

## 2020-06-20 ENCOUNTER — Ambulatory Visit (INDEPENDENT_AMBULATORY_CARE_PROVIDER_SITE_OTHER): Payer: Self-pay | Admitting: Cardiology

## 2020-06-20 ENCOUNTER — Other Ambulatory Visit: Payer: Self-pay

## 2020-06-20 ENCOUNTER — Telehealth: Payer: Self-pay | Admitting: Cardiology

## 2020-06-20 VITALS — BP 242/126 | HR 104 | Ht 72.0 in | Wt 192.0 lb

## 2020-06-20 DIAGNOSIS — I5032 Chronic diastolic (congestive) heart failure: Secondary | ICD-10-CM

## 2020-06-20 DIAGNOSIS — I10 Essential (primary) hypertension: Secondary | ICD-10-CM

## 2020-06-20 MED ORDER — AMLODIPINE BESYLATE 10 MG PO TABS: 10.0000 mg | ORAL_TABLET | Freq: Every day | ORAL | 6 refills | Status: DC

## 2020-06-20 MED ORDER — FUROSEMIDE 40 MG PO TABS: 40.0000 mg | ORAL_TABLET | Freq: Every day | ORAL | 6 refills | Status: DC

## 2020-06-20 NOTE — Patient Instructions (Signed)
  Medication Instructions:  Re-start Lasix 40 mg daily   Re-Start Amlodipine 10 mg daily    *If you need a refill on your cardiac medications before your next appointment, please call your pharmacy*   Lab Work: Cbc,cmet,tsh,a1c,lipids, tsh  If you have labs (blood work) drawn today and your tests are completely normal, you will receive your results only by: Marland Kitchen MyChart Message (if you have MyChart) OR . A paper copy in the mail If you have any lab test that is abnormal or we need to change your treatment, we will call you to review the results.   Testing/Procedures: None today   Follow-Up: At [facility], you and your health needs are our priority.  As part of our continuing mission to provide you with exceptional heart care, we have created designated Provider Care Teams.  These Care Teams include your primary Cardiologist (physician) and Advanced Practice Providers (APPs -  Physician Assistants and Nurse Practitioners) who all work together to provide you with the care you need, when you need it.  We recommend signing up for the patient portal called "MyChart".  Sign up information is provided on this After Visit Summary.  MyChart is used to connect with patients for Virtual Visits (Telemedicine).  Patients are able to view lab/test results, encounter notes, upcoming appointments, etc.  Non-urgent messages can be sent to your provider as well.   To learn more about what you can do with MyChart, go to [email or web address].    Your next appointment:   3 week(s)  The format for your next appointment:   In Person  Provider:   You will see one of the following Advanced Practice Providers on your designated Care Team:    [place], PA-C   Jacolyn Reedy, PA-C       Other Instructions Call in 1 week with blood pressure readings.      Thank you for choosing [facility] !

## 2020-06-20 NOTE — Progress Notes (Signed)
Clinical Summary Matthew Robles is a 65 y.o.male seen today for follow up of the following medical problems.    1. HTN - admission 02/2018 with HTN emergency and SBP in 260s, had not been taking his home meds.  - presented with pulmonary edema. Mild trop elevation in setting of severe HTN and CHF.  Jan 2020 renal US: no stenosis - 07/2018 high aldo/renin ratio but aldo level was normal not consistent with hyperaldo - OSA screening questions normal, BMI 24  - off clonidine due to constipation, fatigue.   - he stopped all his bp meds on his own at home. Has been on norvasc 10, hydral 100mg  tid, labetalol 800mg  tid, lasix 40mg  daily    2. Chronic diastolic HF - echo 02/2018 LVEF 50-55%, grade II diastolic dysfunction, mild RV dysfunction  - stopped lasix on his own - reports recent increased LE edema  3. CKD 3 - has not seen nephrology in some time - did not go for repeat labs that we ordered last visit   4. History of CVA   Past Medical History:  Diagnosis Date  . Diabetes mellitus without complication (HCC)   . Hypertension   . Stroke [facility])    2017     No Known Allergies   Current Outpatient Medications  Medication Sig Dispense Refill  . amLODipine (NORVASC) 10 MG tablet Take 1 tablet by mouth once daily 30 tablet 2  . aspirin EC 81 MG EC tablet Take 1 tablet (81 mg total) by mouth daily.    . COD LIVER OIL PO Take by mouth.    . furosemide (LASIX) 40 MG tablet Take 1 tablet (40 mg total) by mouth daily. 90 tablet 0  . hydrALAZINE (APRESOLINE) 100 MG tablet Take 1 tablet (100 mg total) by mouth 3 (three) times daily. 270 tablet 3  . insulin glargine (LANTUS) 100 UNIT/ML injection Inject 10 Units into the skin daily.    03/2018 labetalol (NORMODYNE) 200 MG tablet Take 4 tablets (800 mg total) by mouth 3 (three) times daily. 1080 tablet 3  . metFORMIN (GLUCOPHAGE) 1000 MG tablet TAKE 1 TABLET BY MOUTH TWICE DAILY WITH MEALS 60 tablet 1  . Specialty  Vitamins Products (MAGNESIUM, AMINO ACID CHELATE,) 133 MG tablet Take 1 tablet by mouth 2 (two) times daily.     No current facility-administered medications for this visit.     Past Surgical History:  Procedure Laterality Date  . WISDOM TOOTH EXTRACTION       No Known Allergies    Family History  Problem Relation Age of Onset  . Diabetes Mother   . Hypertension Mother   . Diabetes Maternal Grandfather      Social History Matthew Robles reports that he quit smoking about 2 years ago. His smoking use included cigarettes. He smoked 1.00 pack per day. He has never used smokeless tobacco. Matthew Robles reports current alcohol use.   Review of Systems CONSTITUTIONAL: No weight loss, fever, chills, weakness or fatigue.  HEENT: Eyes: No visual loss, blurred vision, double vision or yellow sclerae.No hearing loss, sneezing, congestion, runny nose or sore throat.  SKIN: No rash or itching.  CARDIOVASCULAR: per hpi RESPIRATORY: No shortness of breath, cough or sputum.  GASTROINTESTINAL: No anorexia, nausea, vomiting or diarrhea. No abdominal pain or blood.  GENITOURINARY: No burning on urination, no polyuria NEUROLOGICAL: No headache, dizziness, syncope, paralysis, ataxia, numbness or tingling in the extremities. No change in bowel or bladder control.  MUSCULOSKELETAL: No muscle,  back pain, joint pain or stiffness.  LYMPHATICS: No enlarged nodes. No history of splenectomy.  PSYCHIATRIC: No history of depression or anxiety.  ENDOCRINOLOGIC: No reports of sweating, cold or heat intolerance. No polyuria or polydipsia.  Marland Kitchen   Physical Examination Today's Vitals   06/20/20 1253  BP: (!) 242/126  Pulse: (!) 104  SpO2: 96%  Weight: 192 lb (87.1 kg)  Height: 6' (1.829 m)   Body mass index is 26.04 kg/m.  Gen: resting comfortably, no acute distress HEENT: no scleral icterus, pupils equal round and reactive, no palptable cervical adenopathy,  CV: RRR, no m/r/g, +JVD Resp: Clear  to auscultation bilaterally GI: abdomen is soft, non-tender, non-distended, normal bowel sounds, no hepatosplenomegaly MSK: 2+ bilateral LE edema Skin: warm, no rash Neuro:  no focal deficits Psych: appropriate affect   Diagnostic Studies 02/2018 echo Study Conclusions  - Left ventricle: The cavity size was normal. Wall thickness was increased in a pattern of moderate LVH. Systolic function was normal. The estimated ejection fraction was in the range of 50% to 55%. Wall motion was normal; there were no regional wall motion abnormalities. Features are consistent with a pseudonormal left ventricular filling pattern, with concomitant abnormal relaxation and increased filling pressure (grade 2 diastolic dysfunction). Doppler parameters are consistent with high ventricular filling pressure. - Aortic valve: Mildly calcified annulus. Trileaflet; mildly thickened leaflets. Valve area (VTI): 2.17 cm^2. Valve area (Vmax): 2.28 cm^2. - Left atrium: The atrium was severely dilated. - Right ventricle: The cavity size was mildly dilated. Systolic function was mildly reduced. - Right atrium: The atrium was severely dilated. - Technically adequate study.    Assessment and Plan  1. Resistant HTN -stopped all his bp meds on his own at home. Severe HTN in clinic today, no acute symptoms - restart norvasc 10mg ,lasix 40mg  today. Reintroduce hydralazine and labetalol over next few weeks and titrate to prior doses.  - discussed the importance of strict medication compliance - EKG shows LVH with strain pattern.   2. Chronic diastolic HF - significant edema on exam, he stopped his lasix at home - restart lasix 40mg  daily, recheck labs   He is to call with bp's in 1 week, at that time would restart hydralazine moderate dose and titrate, over time restart his labetolol and titrate up. Close f/u for reevaluation of his edema.  Repeat labs ordered, strongly encouraged to  have done.      , M.D.,

## 2020-06-20 NOTE — Telephone Encounter (Signed)
refilled norvasc and lasix to [facility]

## 2020-06-20 NOTE — Telephone Encounter (Signed)
New message     *STAT* If patient is at the pharmacy, call can be transferred to refill team.   1. Which medications need to be refilled? (please list name of each medication and dose if known) amLODipine (NORVASC) 10 MG tablet furosemide (LASIX) 40 MG tablet(Expired)  2. Which pharmacy/location (including street and city if local pharmacy) is medication to be sent to?[facility] [place]  3. Do they need a 30 day or 90 day supply?  30 day supply

## 2020-06-25 ENCOUNTER — Other Ambulatory Visit (HOSPITAL_COMMUNITY)
Admission: RE | Admit: 2020-06-25 | Discharge: 2020-06-25 | Disposition: A | Discharge status: Home / Self Care | Payer: Self-pay | Source: Ambulatory Visit | Attending: Cardiology | Admitting: Cardiology

## 2020-06-25 ENCOUNTER — Other Ambulatory Visit: Payer: Self-pay

## 2020-06-25 ENCOUNTER — Encounter: Payer: Self-pay | Admitting: Cardiology

## 2020-06-25 DIAGNOSIS — I10 Essential (primary) hypertension: Secondary | ICD-10-CM | POA: Insufficient documentation

## 2020-06-25 LAB — COMPREHENSIVE METABOLIC PANEL
ALT: 33 U/L (ref 0–44)
AST: 27 U/L (ref 15–41)
Albumin: 3.9 g/dL (ref 3.5–5.0)
Alkaline Phosphatase: 70 U/L (ref 38–126)
Anion gap: 11 (ref 5–15)
BUN: 31 mg/dL — ABNORMAL HIGH (ref 8–23)
CO2: 27 mmol/L (ref 22–32)
Calcium: 8.6 mg/dL — ABNORMAL LOW (ref 8.9–10.3)
Chloride: 99 mmol/L (ref 98–111)
Creatinine, Ser: 2.63 mg/dL — ABNORMAL HIGH (ref 0.61–1.24)
GFR, Estimated: 26 mL/min — ABNORMAL LOW (ref >60)
Glucose, Bld: 105 mg/dL — ABNORMAL HIGH (ref 70–99)
Potassium: 3.4 mmol/L — ABNORMAL LOW (ref 3.5–5.1)
Sodium: 137 mmol/L (ref 135–145)
Total Bilirubin: 1.1 mg/dL (ref 0.3–1.2)
Total Protein: 8 g/dL (ref 6.5–8.1)

## 2020-06-25 LAB — CBC
HCT: 27.2 % — ABNORMAL LOW (ref 39.0–52.0)
Hemoglobin: 8.1 g/dL — ABNORMAL LOW (ref 13.0–17.0)
MCH: 24.8 pg — ABNORMAL LOW (ref 26.0–34.0)
MCHC: 29.8 g/dL — ABNORMAL LOW (ref 30.0–36.0)
MCV: 83.2 fL (ref 80.0–100.0)
Platelets: 323 10*3/uL (ref 150–400)
RBC: 3.27 MIL/uL — ABNORMAL LOW (ref 4.22–5.81)
RDW: 16.8 % — ABNORMAL HIGH (ref 11.5–15.5)
WBC: 8.6 10*3/uL (ref 4.0–10.5)
nRBC: 0 % (ref 0.0–0.2)

## 2020-06-25 LAB — LIPID PANEL
Cholesterol: 108 mg/dL (ref 0–200)
HDL: 38 mg/dL — ABNORMAL LOW (ref >40)
LDL Cholesterol: 58 mg/dL (ref 0–99)
Total CHOL/HDL Ratio: 2.8 RATIO
Triglycerides: 61 mg/dL (ref <150)
VLDL: 12 mg/dL (ref 0–40)

## 2020-06-25 LAB — HEMOGLOBIN A1C
Hgb A1c MFr Bld: 6.4 % — ABNORMAL HIGH (ref 4.8–5.6)
Mean Plasma Glucose: 136.98 mg/dL

## 2020-06-25 LAB — MAGNESIUM: Magnesium: 2.1 mg/dL (ref 1.7–2.4)

## 2020-06-25 LAB — TSH: TSH: 2.161 u[IU]/mL (ref 0.350–4.500)

## 2020-06-30 ENCOUNTER — Other Ambulatory Visit: Payer: Self-pay

## 2020-06-30 ENCOUNTER — Telehealth: Payer: Self-pay

## 2020-06-30 DIAGNOSIS — Z79899 Other long term (current) drug therapy: Secondary | ICD-10-CM

## 2020-06-30 MED ORDER — LABETALOL HCL 100 MG PO TABS: 100.0000 mg | ORAL_TABLET | Freq: Two times a day (BID) | ORAL | 3 refills | Status: DC

## 2020-06-30 MED ORDER — LABETALOL HCL 100 MG PO TABS: 200.0000 mg | ORAL_TABLET | Freq: Two times a day (BID) | ORAL | 3 refills | Status: DC

## 2020-06-30 MED ORDER — POTASSIUM CHLORIDE CRYS ER 20 MEQ PO TBCR: 20.0000 meq | EXTENDED_RELEASE_TABLET | Freq: Every day | ORAL | 3 refills | Status: DC

## 2020-06-30 NOTE — Telephone Encounter (Signed)
Patient voiced understanding and said he will keep a bp diary for the next week and update our office

## 2020-06-30 NOTE — Telephone Encounter (Signed)
-----   Message from Antoine Poche, MD sent at 06/30/2020 11:15 AM EST ----- BP's too high, can he start labetalol 200mg  bid, update on bp's next week. Will work to increase his doses over time slowly back to his prior regimen to avoid significant side effects   [place] MD

## 2020-06-30 NOTE — Telephone Encounter (Signed)
Called patient with no answer. Let message for call back.

## 2020-06-30 NOTE — Telephone Encounter (Signed)
-----   Message from Antoine Poche, MD sent at 06/30/2020 11:18 AM EST ----- Labs show potassium is a little low, can we start KCl daily. Kidney function has declined a mild degree further. He is also anemic, this could be due to worsening kidney disease which can cause anemia. He really needs a pcp to help monitor these multiple issues. Repeat BMET in 2 weeks  Dominga Ferry MD

## 2020-06-30 NOTE — Telephone Encounter (Signed)
Pt returning call

## 2020-06-30 NOTE — Telephone Encounter (Signed)
Spoke with patient. He verbalized understanding of results. He verbalized understanding the need for a PCP per Dr. Wyline Mood. Medications sent to patients pharmacy and orders for BMET put in.

## 2020-07-13 NOTE — Progress Notes (Deleted)
Cardiology Office Note:    Date:  07/13/2020   ID:  Matthew Robles, DOB August 01, 1955, MRN [id number]  PCP:  Patient, No Pcp Per  Cardiologist:  Dina Rich, MD   Referring MD: No ref. provider found   No chief complaint on file. ***  History of Present Illness:    Matthew Robles is a 65 y.o. male with a hx of HTN,  HFpEF, cerebellar stroke, CKD stage III, DM, and noncompliance with medications. He was admitted with hypertensive urgency due to medication noncompliance in 02/2018. He had severe HTN, CHF, and pulmonary edema. Renal US with no stenosis. Aldo/renin ratio not consistent with hyperaldosteronism. OSA not suspected. He did not tolerate clonidine due to constipation and fatigue. Prior to stopping medications, he was on 10 mg norvasc, 100 mg hydralazine TID, 800 mg labetalol TID, and 40 mg lasix. Echo 2019 wit LVEF 50-55% and grade 2 DD, mild RV dysfunction.   He was seen by Dr. Wyline Mood 06/20/20. He had not gone for repeat labs as instructed. He had stopped all of his BP meds on his own. Norvasc and lasix were restarted. Repeat labs showed anemia, hypokalemia, and worsening renal function. He was asked to establish with a PCP and was started on low dose potassium.   Labetalol was restarted at 200 mg BID - plan to titrate slowly to avoid side effects.    Hypertension - he has restarted 200 mg labetalol BID - continue norvasc and lasix   Hx of stroke - continue ASA   Chronic diastolic heart failure - restarted 40 mg lasix   CKD stage III - sCr 2.63 at last check - will recheck today - needs to re-establish with nephrology   Hypokalemia - has been taking 20 mEq potassium - recheck BMP today   IDDM - A1c surprisingly was 6.4% - down from 13.6 2 years ago - still needs to establish with a PCP to follow this     Past Medical History:  Diagnosis Date  . Diabetes mellitus without complication (HCC)   . Hypertension   . Stroke [facility])    2017    Past  Surgical History:  Procedure Laterality Date  . WISDOM TOOTH EXTRACTION      Current Medications: No outpatient medications have been marked as taking for the 07/15/20 encounter (Appointment) with Marcelino Duster, PA.     Allergies:   Patient has no known allergies.   Social History   Socioeconomic History  . Marital status: Single    Spouse name: Not on file  . Number of children: Not on file  . Years of education: Not on file  . Highest education level: Not on file  Occupational History  . Not on file  Tobacco Use  . Smoking status: Former Smoker    Packs/day: 1.00    Types: Cigarettes    Quit date: 09/15/2017    Years since quitting: 2.8  . Smokeless tobacco: Never Used  Vaping Use  . Vaping Use: Never used  Substance and Sexual Activity  . Alcohol use: Yes    Comment: a couple of times a week  . Drug use: Not Currently    Types: Marijuana    Comment: occassionally  . Sexual activity: Not on file  Other Topics Concern  . Not on file  Social History Narrative  . Not on file   Social Determinants of Health   Financial Resource Strain: Not on file  Food Insecurity: Not on file  Transportation Needs: Not on  file  Physical Activity: Not on file  Stress: Not on file  Social Connections: Not on file     Family History: The patient's ***family history includes Diabetes in his maternal grandfather and mother; Hypertension in his mother.  ROS:   Please see the history of present illness.    *** All other systems reviewed and are negative.  EKGs/Labs/Other Studies Reviewed:    The following studies were reviewed today:  Echo 02/2018: Study Conclusions   - Left ventricle: The cavity size was normal. Wall thickness was  increased in a pattern of moderate LVH. Systolic function was  normal. The estimated ejection fraction was in the range of 50%  to 55%. Wall motion was normal; there were no regional wall  motion abnormalities. Features are  consistent with a pseudonormal  left ventricular filling pattern, with concomitant abnormal  relaxation and increased filling pressure (grade 2 diastolic  dysfunction). Doppler parameters are consistent with high  ventricular filling pressure.  - Aortic valve: Mildly calcified annulus. Trileaflet; mildly  thickened leaflets. Valve area (VTI): 2.17 cm^2. Valve area  (Vmax): 2.28 cm^2.  - Left atrium: The atrium was severely dilated.  - Right ventricle: The cavity size was mildly dilated. Systolic  function was mildly reduced.  - Right atrium: The atrium was severely dilated.  - Technically adequate study.   EKG:  EKG is *** ordered today.  The ekg ordered today demonstrates ***  Recent Labs: 06/25/2020: ALT 33; BUN 31; Creatinine, Ser 2.63; Hemoglobin 8.1; Magnesium 2.1; Platelets 323; Potassium 3.4; Sodium 137; TSH 2.161  Recent Lipid Panel    Component Value Date/Time   CHOL 108 06/25/2020 1056   TRIG 61 06/25/2020 1056   HDL 38 (L) 06/25/2020 1056   CHOLHDL 2.8 06/25/2020 1056   VLDL 12 06/25/2020 1056   LDLCALC 58 06/25/2020 1056    Physical Exam:    VS:  There were no vitals taken for this visit.    Wt Readings from Last 3 Encounters:  06/20/20 192 lb (87.1 kg)  09/21/19 181 lb (82.1 kg)  08/14/19 173 lb (78.5 kg)     GEN: *** Well nourished, well developed in no acute distress HEENT: Normal NECK: No JVD; No carotid bruits LYMPHATICS: No lymphadenopathy CARDIAC: ***RRR, no murmurs, rubs, gallops RESPIRATORY:  Clear to auscultation without rales, wheezing or rhonchi  ABDOMEN: Soft, non-tender, non-distended MUSCULOSKELETAL:  No edema; No deformity  SKIN: Warm and dry NEUROLOGIC:  Alert and oriented x 3 PSYCHIATRIC:  Normal affect   ASSESSMENT:    No diagnosis found. PLAN:    In order of problems listed above:  No diagnosis found.   Medication Adjustments/Labs and Tests Ordered: Current medicines are reviewed at length with the patient  today.  Concerns regarding medicines are outlined above.  No orders of the defined types were placed in this encounter.  No orders of the defined types were placed in this encounter.   Signed, Marcelino Duster, [place]  07/13/2020 10:03 PM    [facility]

## 2020-07-15 ENCOUNTER — Ambulatory Visit: Payer: Self-pay | Admitting: Physician Assistant

## 2020-12-25 ENCOUNTER — Encounter: Payer: Self-pay | Admitting: Emergency Medicine

## 2020-12-25 ENCOUNTER — Ambulatory Visit
Admission: EM | Admit: 2020-12-25 | Discharge: 2020-12-25 | Disposition: A | Discharge status: Home / Self Care | Payer: Self-pay

## 2020-12-25 ENCOUNTER — Emergency Department (HOSPITAL_COMMUNITY)
Admission: EM | Admit: 2020-12-25 | Discharge: 2020-12-26 | Disposition: A | Discharge status: Home / Self Care | Payer: Self-pay | Attending: Emergency Medicine | Admitting: Emergency Medicine

## 2020-12-25 ENCOUNTER — Encounter (HOSPITAL_COMMUNITY): Payer: Self-pay

## 2020-12-25 ENCOUNTER — Other Ambulatory Visit: Payer: Self-pay

## 2020-12-25 ENCOUNTER — Emergency Department (HOSPITAL_COMMUNITY): Payer: Self-pay

## 2020-12-25 DIAGNOSIS — R600 Localized edema: Secondary | ICD-10-CM | POA: Insufficient documentation

## 2020-12-25 DIAGNOSIS — D649 Anemia, unspecified: Secondary | ICD-10-CM | POA: Insufficient documentation

## 2020-12-25 DIAGNOSIS — Z79899 Other long term (current) drug therapy: Secondary | ICD-10-CM | POA: Insufficient documentation

## 2020-12-25 DIAGNOSIS — N183 Chronic kidney disease, stage 3 unspecified: Secondary | ICD-10-CM | POA: Insufficient documentation

## 2020-12-25 DIAGNOSIS — Z794 Long term (current) use of insulin: Secondary | ICD-10-CM | POA: Insufficient documentation

## 2020-12-25 DIAGNOSIS — I129 Hypertensive chronic kidney disease with stage 1 through stage 4 chronic kidney disease, or unspecified chronic kidney disease: Secondary | ICD-10-CM | POA: Insufficient documentation

## 2020-12-25 DIAGNOSIS — Z7982 Long term (current) use of aspirin: Secondary | ICD-10-CM | POA: Insufficient documentation

## 2020-12-25 DIAGNOSIS — N179 Acute kidney failure, unspecified: Secondary | ICD-10-CM | POA: Insufficient documentation

## 2020-12-25 DIAGNOSIS — I160 Hypertensive urgency: Secondary | ICD-10-CM | POA: Insufficient documentation

## 2020-12-25 DIAGNOSIS — E1122 Type 2 diabetes mellitus with diabetic chronic kidney disease: Secondary | ICD-10-CM | POA: Insufficient documentation

## 2020-12-25 DIAGNOSIS — Z87891 Personal history of nicotine dependence: Secondary | ICD-10-CM | POA: Insufficient documentation

## 2020-12-25 DIAGNOSIS — R609 Edema, unspecified: Secondary | ICD-10-CM

## 2020-12-25 LAB — URINALYSIS, ROUTINE W REFLEX MICROSCOPIC
Bilirubin Urine: NEGATIVE
Glucose, UA: 50 mg/dL — AB
Ketones, ur: NEGATIVE mg/dL
Leukocytes,Ua: NEGATIVE
Nitrite: NEGATIVE
Protein, ur: 30 mg/dL — AB
Specific Gravity, Urine: 1.006 (ref 1.005–1.030)
pH: 8 (ref 5.0–8.0)

## 2020-12-25 LAB — CBC WITH DIFFERENTIAL/PLATELET
Abs Immature Granulocytes: 0.03 10*3/uL (ref 0.00–0.07)
Basophils Absolute: 0.1 10*3/uL (ref 0.0–0.1)
Basophils Relative: 1 %
Eosinophils Absolute: 0.3 10*3/uL (ref 0.0–0.5)
Eosinophils Relative: 3 %
HCT: 27.3 % — ABNORMAL LOW (ref 39.0–52.0)
Hemoglobin: 8.1 g/dL — ABNORMAL LOW (ref 13.0–17.0)
Immature Granulocytes: 0 %
Lymphocytes Relative: 7 %
Lymphs Abs: 0.7 10*3/uL (ref 0.7–4.0)
MCH: 23.8 pg — ABNORMAL LOW (ref 26.0–34.0)
MCHC: 29.7 g/dL — ABNORMAL LOW (ref 30.0–36.0)
MCV: 80.3 fL (ref 80.0–100.0)
Monocytes Absolute: 1 10*3/uL (ref 0.1–1.0)
Monocytes Relative: 9 %
Neutro Abs: 8.2 10*3/uL — ABNORMAL HIGH (ref 1.7–7.7)
Neutrophils Relative %: 80 %
Platelets: 318 10*3/uL (ref 150–400)
RBC: 3.4 MIL/uL — ABNORMAL LOW (ref 4.22–5.81)
RDW: 20.3 % — ABNORMAL HIGH (ref 11.5–15.5)
WBC: 10.3 10*3/uL (ref 4.0–10.5)
nRBC: 0 % (ref 0.0–0.2)

## 2020-12-25 LAB — LACTIC ACID, PLASMA: Lactic Acid, Venous: 1.1 mmol/L (ref 0.5–1.9)

## 2020-12-25 LAB — COMPREHENSIVE METABOLIC PANEL
ALT: 32 U/L (ref 0–44)
AST: 36 U/L (ref 15–41)
Albumin: 3.2 g/dL — ABNORMAL LOW (ref 3.5–5.0)
Alkaline Phosphatase: 88 U/L (ref 38–126)
Anion gap: 9 (ref 5–15)
BUN: 48 mg/dL — ABNORMAL HIGH (ref 8–23)
CO2: 29 mmol/L (ref 22–32)
Calcium: 8.6 mg/dL — ABNORMAL LOW (ref 8.9–10.3)
Chloride: 96 mmol/L — ABNORMAL LOW (ref 98–111)
Creatinine, Ser: 3.44 mg/dL — ABNORMAL HIGH (ref 0.61–1.24)
GFR, Estimated: 19 mL/min — ABNORMAL LOW (ref >60)
Glucose, Bld: 125 mg/dL — ABNORMAL HIGH (ref 70–99)
Potassium: 3.8 mmol/L (ref 3.5–5.1)
Sodium: 134 mmol/L — ABNORMAL LOW (ref 135–145)
Total Bilirubin: 1.7 mg/dL — ABNORMAL HIGH (ref 0.3–1.2)
Total Protein: 6.9 g/dL (ref 6.5–8.1)

## 2020-12-25 MED ORDER — FUROSEMIDE 40 MG PO TABS: 40.0000 mg | ORAL_TABLET | Freq: Two times a day (BID) | ORAL | 0 refills | Status: DC

## 2020-12-25 MED ORDER — AMLODIPINE BESYLATE 10 MG PO TABS: 10.0000 mg | ORAL_TABLET | Freq: Every day | ORAL | 0 refills | Status: DC

## 2020-12-25 MED ORDER — LORAZEPAM 2 MG/ML IJ SOLN: 1.0000 mg | Freq: Once | INTRAMUSCULAR | Status: DC

## 2020-12-25 MED ORDER — POTASSIUM CHLORIDE CRYS ER 20 MEQ PO TBCR: 20.0000 meq | EXTENDED_RELEASE_TABLET | Freq: Every day | ORAL | 0 refills | Status: DC

## 2020-12-25 MED ORDER — HYDRALAZINE HCL 100 MG PO TABS: 100.0000 mg | ORAL_TABLET | Freq: Three times a day (TID) | ORAL | 0 refills | Status: DC

## 2020-12-25 MED ORDER — ALUM & MAG HYDROXIDE-SIMETH 200-200-20 MG/5ML PO SUSP: 30.0000 mL | Freq: Once | ORAL | Status: DC

## 2020-12-25 MED ORDER — SODIUM CHLORIDE 0.9 % IV BOLUS: 1000.0000 mL | Freq: Once | INTRAVENOUS | Status: DC

## 2020-12-25 MED ORDER — PANTOPRAZOLE SODIUM 40 MG IV SOLR: 40.0000 mg | Freq: Once | INTRAVENOUS | Status: DC

## 2020-12-25 MED ORDER — ONDANSETRON HCL 4 MG/2ML IJ SOLN: 4.0000 mg | Freq: Once | INTRAMUSCULAR | Status: DC

## 2020-12-25 NOTE — ED Triage Notes (Signed)
Swelling to bilateral lower legs x 3 weeks.  Pt has copious amount of yellow drainage from RT lower leg. Pt states he does not have a primary care doctor.

## 2020-12-25 NOTE — ED Notes (Signed)
Pt advised to go to the Emergency department d/t him needing further workup for hypertensive crisis and drainage from leg .  Pt has sandwich bag taped around his lower RT leg with collection of yellow fluid from his leg.  Ask for container to empty fluid, gave patient a urinal.  Pt began to empty the bag and the urinal tipped over.  Pt states in a hostile manner that he needs some assistance.  When asking the patient what could be done to further assistance him pt did not respond and dumped the sandwich bag full of fluid on a chuck pad in the floor.  Xray tech entered the room and ask the patient if he needed any assistance, pt became verbally hostile and complaining that no one gave him a trash can to empty his bag.  Ask pt to please leave or we will call the police, pt states to call them.  Left room and called police. Pt left premises after being ask by officer to leave.

## 2020-12-25 NOTE — Discharge Instructions (Addendum)
The testing today indicates that your blood pressure is very high, causing some damage to your kidneys, and likely contributing to your edema.  Since you are choosing to leave AGAINST MEDICAL ADVICE, we are giving you prescriptions that can hopefully help you get better.  Please fill the prescriptions and start taking them tomorrow.  Also, call Dr. Wyline Mood for follow-up appointment to make sure that your blood pressure is getting better

## 2020-12-25 NOTE — ED Notes (Signed)
Pt is resting at this time with no needs.

## 2020-12-25 NOTE — ED Notes (Signed)
Patient is being discharged from the Urgent Care and sent to the Emergency Department via pov . Per [place], PA-C patient is in need of higher level of care due to hypertension. Patient is aware and verbalizes understanding of plan of care.  Vitals:   12/25/20 1713  BP: (!) 227/128  Pulse: (!) 109  Resp: 20  Temp: 98.3 F (36.8 C)  SpO2: 95%

## 2020-12-25 NOTE — ED Provider Notes (Signed)
[facility] EMERGENCY DEPARTMENT Provider Note   CSN: [id number] Arrival date & time: 12/25/20  1750     History No chief complaint on file.   Matthew Robles is a 65 y.o. male.  HPI He is here for evaluation of swelling in the legs for several months, with fluid leaking from his right lower leg, after he bumped it while working in his shed the other day.  He denies shortness of breath, chest pain, weakness or dizziness.  He takes his medicine sporadically.  He was taking labetalol, but stopped it because he did not like how it made him feel.  He takes Norvasc sometimes.  He states he is taking furosemide once a day but did not take it today.  He last saw his cardiologist, 5 months ago.  He is apparently prescribing the blood pressure medication.  At that time the patient was noted to have stopped taking Norvasc, hydralazine, labetalol, and Lasix.  He has an EF of 50 to 55% with grade 2 diastolic dysfunction.  He has chronic kidney disease and does not follow-up with nephrology.  He has diabetes, has been prescribed insulin but prefers to not take it and try to control his diet.  He denies chest pain, fever, chills, nausea or vomiting.  He is not having headache or back pain.  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/15/2016   Hypokalemia 01/15/2016   ARF (acute renal failure) (HCC) 01/15/2016    Past Surgical History:  Procedure Laterality  Date   WISDOM TOOTH EXTRACTION         Family History  Problem Relation Age of Onset   Diabetes Mother    Hypertension Mother    Diabetes Maternal Grandfather     Social History   Tobacco Use   Smoking status: Former    Packs/day: 1.00    Types: Cigarettes    Quit date: 09/15/2017    Years since quitting: 3.2   Smokeless tobacco: Never  Vaping Use   Vaping Use: Never used  Substance Use Topics   Alcohol use: Not Currently    Alcohol/week: 4.0 standard drinks    Types: 4 Glasses of wine per week    Comment: month   Drug use: Not Currently    Types: Marijuana    Comment: occassionally    Home Medications Prior to Admission medications   Medication Sig Start Date End Date Taking? Authorizing Provider  hydrALAZINE (APRESOLINE) 100 MG tablet Take 1 tablet (100 mg total) by mouth 3 (three) times daily. 12/25/20  Yes Mancel Bale, MD  amLODipine (NORVASC) 10 MG tablet Take 1 tablet (10 mg total) by mouth daily. 12/25/20   Mancel Bale, MD  aspirin EC 81 MG EC tablet Take 1 tablet (81 mg total) by mouth daily. Patient not taking: No sig reported 02/26/18   Standley Dakins L, MD  furosemide (LASIX) 40 MG tablet Take 1 tablet (40 mg total)  by mouth 2 (two) times daily. 12/25/20 03/25/21  Mancel Bale, MD  insulin glargine (LANTUS) 100 UNIT/ML injection Inject 10 Units into the skin daily. Patient not taking: No sig reported    [provider]  labetalol (NORMODYNE) 100 MG tablet Take 2 tablets (200 mg total) by mouth 2 (two) times daily. Patient not taking: No sig reported 06/30/20   Antoine Poche, MD  potassium chloride SA (KLOR-CON) 20 MEQ tablet Take 1 tablet (20 mEq total) by mouth daily. 12/25/20   Mancel Bale, MD    Allergies    Patient has no known allergies.  Review of Systems   Review of Systems  All other systems reviewed and are negative.  Physical Exam Updated Vital Signs BP (!) 168/131   Pulse (!) 109   Temp 98.2 F (36.8 C) (Oral)   Resp  18   Ht 6' (1.829 m)   Wt 88 kg   SpO2 94%   BMI 26.31 kg/m   Physical Exam Vitals and nursing note reviewed.  Constitutional:      General: He is not in acute distress.    Appearance: He is well-developed. He is not ill-appearing.  HENT:     Head: Normocephalic and atraumatic.     Right Ear: External ear normal.     Left Ear: External ear normal.  Eyes:     Conjunctiva/sclera: Conjunctivae normal.     Pupils: Pupils are equal, round, and reactive to light.  Neck:     Trachea: Phonation normal.  Cardiovascular:     Rate and Rhythm: Normal rate and regular rhythm.     Heart sounds: Normal heart sounds.  Pulmonary:     Effort: Pulmonary effort is normal. No respiratory distress.     Breath sounds: Normal breath sounds. No stridor.  Abdominal:     General: There is no distension.     Palpations: Abdomen is soft.     Tenderness: There is no abdominal tenderness.  Musculoskeletal:        General: Normal range of motion.     Cervical back: Normal range of motion and neck supple.     Right lower leg: Edema present.     Left lower leg: Edema present.     Comments: Right anterior lower leg draining small amount of clear fluid from a small puncture.  Leg swelling is 3-4+ bilaterally.  Lower extremities are nontender to palpation.  Skin:    General: Skin is warm and dry.  Neurological:     Mental Status: He is alert and oriented to person, place, and time.     Cranial Nerves: No cranial nerve deficit.     Sensory: No sensory deficit.     Motor: No abnormal muscle tone.     Coordination: Coordination normal.  Psychiatric:        Mood and Affect: Mood normal.        Behavior: Behavior normal.        Thought Content: Thought content normal.        Judgment: Judgment normal.    ED Results / Procedures / Treatments   Labs (all labs ordered are listed, but only abnormal results are displayed) Labs Reviewed  CBC WITH DIFFERENTIAL/PLATELET - Abnormal; Notable for the following  components:      Result Value   RBC 3.40 (*)    Hemoglobin 8.1 (*)    HCT 27.3 (*)    MCH 23.8 (*)    MCHC 29.7 (*)  RDW 20.3 (*)    Neutro Abs 8.2 (*)    All other components within normal limits  COMPREHENSIVE METABOLIC PANEL - Abnormal; Notable for the following components:   Sodium 134 (*)    Chloride 96 (*)    Glucose, Bld 125 (*)    BUN 48 (*)    Creatinine, Ser 3.44 (*)    Calcium 8.6 (*)    Albumin 3.2 (*)    Total Bilirubin 1.7 (*)    GFR, Estimated 19 (*)    All other components within normal limits  LACTIC ACID, PLASMA  LACTIC ACID, PLASMA  URINALYSIS, ROUTINE W REFLEX MICROSCOPIC    EKG None  Radiology DG Chest 2 View  Result Date: 12/25/2020 CLINICAL DATA:  Swelling.  Bilateral leg swelling and cough EXAM: CHEST - 2 VIEW COMPARISON:  Chest x-ray 02/24/2018 FINDINGS: Prominent cardiac silhouette. The heart size and mediastinal contours are unchanged. No focal consolidation. No pulmonary edema. No pleural effusion. No pneumothorax. No acute osseous abnormality. Degenerative changes bilateral shoulders. IMPRESSION: No active cardiopulmonary disease. Electronically Signed   By: Tish Frederickson M.D.   On: 12/25/2020 21:35    Procedures Procedures   Medications Ordered in ED Medications - No data to display  ED Course  I have reviewed the triage vital signs and the nursing notes.  Pertinent labs & imaging results that were available during my care of the patient were reviewed by me and considered in my medical decision making (see chart for details).    MDM Rules/Calculators/A&P                            Patient Vitals for the past 24 hrs:  BP Temp Temp src Pulse Resp SpO2 Height Weight  12/25/20 2300 (!) 168/131 -- -- (!) 109 18 94 % -- --  12/25/20 2100 (!) 202/116 -- -- (!) 106 (!) 28 97 % -- --  12/25/20 2045 -- -- -- (!) 103 14 97 % -- --  12/25/20 1945 -- -- -- (!) 103 12 94 % -- --  12/25/20 1930 (!) 193/125 -- -- (!) 103 (!) 21 95 % -- --   12/25/20 1915 -- -- -- (!) 103 (!) 22 93 % -- --  12/25/20 1900 (!) 198/112 -- -- (!) 103 17 90 % -- --  12/25/20 1845 -- -- -- (!) 117 17 96 % -- --  12/25/20 1830 (!) 224/152 -- -- (!) 114 16 (!) 87 % -- --  12/25/20 1817 -- -- -- -- -- -- 6' (1.829 m) 88 kg  12/25/20 1816 (!) 228/148 98.2 F (36.8 C) Oral (!) 110 20 100 % -- --    11:36 PM Reevaluation with update and discussion. After initial assessment and treatment, an updated evaluation reveals he remains hypertensive.  He is mentating normally.  Findings discussed with the patient.  I advised him to be hospitalized for hypertensive urgency leading to renal failure.  He prefers to go home and states he will leave AGAINST MEDICAL ADVICE.  I offered to prescribe him medicines to help his condition and he seemed to agree.  All questions answered. Mancel Bale   Medical Decision Making:  This patient is presenting for evaluation of leg swelling, which does require a range of treatment options, and is a complaint that involves a moderate risk of morbidity and mortality. The differential diagnoses include peripheral edema, central edema, heart failure complications, hypertensive urgency. I decided to review  old records, and in summary elderly male, prescribed medications which he does not take for high blood pressure.  He is choosing to make his own medical choices and not following recommendations.  He has been prescribed insulin but not taking it..  I did not require additional historical information from anyone.  Clinical Laboratory Tests Ordered, included CBC, Metabolic panel, Urinalysis, and lactic acid . Review indicates normal except sodium low, chloride low, glucose high, BUN high, creatinine high, calcium high, albumin low, total bilirubin high, hemoglobin low, urinalysis abnormal. Radiologic Tests Ordered, included chest x-ray.  I independently Visualized: Radiographic images, which show no infiltrate or edema      Critical  Interventions-clinical evaluation, laboratory testing, radiography, observation, discussion with patient regarding necessity of hospitalization, he declined and decided to leave AGAINST MEDICAL ADVICE  After These Interventions, the Patient was reevaluated and was found patient with peripheral edema, leaking fluid from a minor injury, without signs of infection of the legs.  He has hypertensive urgency, secondary to noncompliance with prescribed medications.  Patient declines to be hospitalized, preferring to manage his condition at home.  He seemed agreeable to starting medication so I wrote him prescriptions to use after he left AGAINST MEDICAL ADVICE.  He was encouraged to follow-up with his cardiologist for further management care.  CRITICAL CARE-no Performed by: Mancel BaleElliott Darolyn Double  Nursing Notes Reviewed/ Care Coordinated Applicable Imaging Reviewed Interpretation of Laboratory Data incorporated into ED treatment  The patient appears reasonably screened and/or stabilized for discharge and I doubt any other medical condition or other [facility] requiring further screening, evaluation, or treatment in the ED at this time prior to discharge.  Plan: Home Medications-restart prescribed medications; Home Treatments-low-salt diet; return here if the recommended treatment, does not improve the symptoms; Recommended follow up-cardiology follow-up for further care soon as possible     Final Clinical Impression(s) / ED Diagnoses Final diagnoses:  Hypertensive urgency  Peripheral edema  Acute kidney injury (HCC)  Anemia, unspecified type    Rx / DC Orders ED Discharge Orders          Ordered    amLODipine (NORVASC) 10 MG tablet  Daily        12/25/20 2346    furosemide (LASIX) 40 MG tablet  2 times daily        12/25/20 2346    potassium chloride SA (KLOR-CON) 20 MEQ tablet  Daily        12/25/20 2346    hydrALAZINE (APRESOLINE) 100 MG tablet  3 times daily        12/25/20 2346              Mancel BaleWentz, Janeliz Prestwood, MD 12/28/20 1116

## 2020-12-25 NOTE — ED Triage Notes (Signed)
Right leg ozzing, pt injured it last week since has been leaking fluid.  Pt has a zip lock bag to his leg with tape.  Pt alert and oriented, pt has a history of fluid in his legs and abdomen.  Not pain.

## 2021-07-28 ENCOUNTER — Other Ambulatory Visit: Payer: Self-pay | Admitting: Cardiology

## 2022-06-24 ENCOUNTER — Emergency Department (HOSPITAL_COMMUNITY): Payer: Self-pay

## 2022-06-24 ENCOUNTER — Other Ambulatory Visit (HOSPITAL_COMMUNITY): Payer: Self-pay | Admitting: *Deleted

## 2022-06-24 ENCOUNTER — Inpatient Hospital Stay (HOSPITAL_COMMUNITY): Payer: Self-pay

## 2022-06-24 ENCOUNTER — Inpatient Hospital Stay (HOSPITAL_COMMUNITY)
Admission: EM | Admit: 2022-06-24 | Discharge: 2022-07-05 | DRG: 291 | Disposition: A | Discharge status: Home / Self Care | Payer: Self-pay | Attending: Internal Medicine | Admitting: Internal Medicine

## 2022-06-24 ENCOUNTER — Other Ambulatory Visit: Payer: Self-pay

## 2022-06-24 ENCOUNTER — Encounter (HOSPITAL_COMMUNITY): Payer: Self-pay

## 2022-06-24 DIAGNOSIS — Z833 Family history of diabetes mellitus: Secondary | ICD-10-CM

## 2022-06-24 DIAGNOSIS — D72829 Elevated white blood cell count, unspecified: Secondary | ICD-10-CM | POA: Diagnosis present

## 2022-06-24 DIAGNOSIS — Z87891 Personal history of nicotine dependence: Secondary | ICD-10-CM

## 2022-06-24 DIAGNOSIS — D631 Anemia in chronic kidney disease: Secondary | ICD-10-CM | POA: Diagnosis present

## 2022-06-24 DIAGNOSIS — N185 Chronic kidney disease, stage 5: Secondary | ICD-10-CM

## 2022-06-24 DIAGNOSIS — Z8673 Personal history of transient ischemic attack (TIA), and cerebral infarction without residual deficits: Secondary | ICD-10-CM

## 2022-06-24 DIAGNOSIS — E871 Hypo-osmolality and hyponatremia: Secondary | ICD-10-CM | POA: Diagnosis present

## 2022-06-24 DIAGNOSIS — E1149 Type 2 diabetes mellitus with other diabetic neurological complication: Secondary | ICD-10-CM | POA: Diagnosis present

## 2022-06-24 DIAGNOSIS — Z79899 Other long term (current) drug therapy: Secondary | ICD-10-CM

## 2022-06-24 DIAGNOSIS — E1122 Type 2 diabetes mellitus with diabetic chronic kidney disease: Secondary | ICD-10-CM | POA: Diagnosis present

## 2022-06-24 DIAGNOSIS — R051 Acute cough: Secondary | ICD-10-CM | POA: Diagnosis present

## 2022-06-24 DIAGNOSIS — N250 Renal osteodystrophy: Secondary | ICD-10-CM | POA: Diagnosis present

## 2022-06-24 DIAGNOSIS — J90 Pleural effusion, not elsewhere classified: Secondary | ICD-10-CM | POA: Diagnosis present

## 2022-06-24 DIAGNOSIS — Z992 Dependence on renal dialysis: Secondary | ICD-10-CM

## 2022-06-24 DIAGNOSIS — Z1152 Encounter for screening for COVID-19: Secondary | ICD-10-CM

## 2022-06-24 DIAGNOSIS — J9601 Acute respiratory failure with hypoxia: Secondary | ICD-10-CM | POA: Diagnosis present

## 2022-06-24 DIAGNOSIS — N186 End stage renal disease: Secondary | ICD-10-CM | POA: Diagnosis present

## 2022-06-24 DIAGNOSIS — N200 Calculus of kidney: Secondary | ICD-10-CM | POA: Diagnosis present

## 2022-06-24 DIAGNOSIS — E269 Hyperaldosteronism, unspecified: Secondary | ICD-10-CM | POA: Diagnosis present

## 2022-06-24 DIAGNOSIS — M109 Gout, unspecified: Secondary | ICD-10-CM | POA: Diagnosis present

## 2022-06-24 DIAGNOSIS — Z8249 Family history of ischemic heart disease and other diseases of the circulatory system: Secondary | ICD-10-CM

## 2022-06-24 DIAGNOSIS — D638 Anemia in other chronic diseases classified elsewhere: Secondary | ICD-10-CM | POA: Diagnosis present

## 2022-06-24 DIAGNOSIS — E876 Hypokalemia: Secondary | ICD-10-CM | POA: Diagnosis present

## 2022-06-24 DIAGNOSIS — I5031 Acute diastolic (congestive) heart failure: Secondary | ICD-10-CM

## 2022-06-24 DIAGNOSIS — E8721 Acute metabolic acidosis: Secondary | ICD-10-CM | POA: Diagnosis present

## 2022-06-24 DIAGNOSIS — E872 Acidosis, unspecified: Secondary | ICD-10-CM | POA: Diagnosis present

## 2022-06-24 DIAGNOSIS — N179 Acute kidney failure, unspecified: Secondary | ICD-10-CM | POA: Diagnosis present

## 2022-06-24 DIAGNOSIS — D509 Iron deficiency anemia, unspecified: Secondary | ICD-10-CM | POA: Diagnosis present

## 2022-06-24 DIAGNOSIS — Z91148 Patient's other noncompliance with medication regimen for other reason: Secondary | ICD-10-CM

## 2022-06-24 DIAGNOSIS — E8809 Other disorders of plasma-protein metabolism, not elsewhere classified: Secondary | ICD-10-CM | POA: Diagnosis present

## 2022-06-24 DIAGNOSIS — I509 Heart failure, unspecified: Secondary | ICD-10-CM

## 2022-06-24 DIAGNOSIS — I132 Hypertensive heart and chronic kidney disease with heart failure and with stage 5 chronic kidney disease, or end stage renal disease: Principal | ICD-10-CM | POA: Diagnosis present

## 2022-06-24 DIAGNOSIS — J810 Acute pulmonary edema: Secondary | ICD-10-CM | POA: Diagnosis present

## 2022-06-24 DIAGNOSIS — R7989 Other specified abnormal findings of blood chemistry: Secondary | ICD-10-CM | POA: Insufficient documentation

## 2022-06-24 DIAGNOSIS — I5043 Acute on chronic combined systolic (congestive) and diastolic (congestive) heart failure: Secondary | ICD-10-CM | POA: Diagnosis present

## 2022-06-24 DIAGNOSIS — I161 Hypertensive emergency: Principal | ICD-10-CM | POA: Diagnosis present

## 2022-06-24 DIAGNOSIS — I5021 Acute systolic (congestive) heart failure: Secondary | ICD-10-CM | POA: Insufficient documentation

## 2022-06-24 HISTORY — DX: Heart failure, unspecified: I50.9

## 2022-06-24 LAB — ABO/RH: ABO/RH(D): O POS

## 2022-06-24 LAB — MRSA NEXT GEN BY PCR, NASAL: MRSA by PCR Next Gen: NOT DETECTED

## 2022-06-24 LAB — COMPREHENSIVE METABOLIC PANEL
ALT: 13 U/L (ref 0–44)
AST: 25 U/L (ref 15–41)
Albumin: 2 g/dL — ABNORMAL LOW (ref 3.5–5.0)
Alkaline Phosphatase: 70 U/L (ref 38–126)
Anion gap: 15 (ref 5–15)
BUN: 76 mg/dL — ABNORMAL HIGH (ref 8–23)
CO2: 15 mmol/L — ABNORMAL LOW (ref 22–32)
Calcium: 6.7 mg/dL — ABNORMAL LOW (ref 8.9–10.3)
Chloride: 103 mmol/L (ref 98–111)
Creatinine, Ser: 12.15 mg/dL — ABNORMAL HIGH (ref 0.61–1.24)
GFR, Estimated: 4 mL/min — ABNORMAL LOW (ref >60)
Glucose, Bld: 228 mg/dL — ABNORMAL HIGH (ref 70–99)
Potassium: 3.2 mmol/L — ABNORMAL LOW (ref 3.5–5.1)
Sodium: 133 mmol/L — ABNORMAL LOW (ref 135–145)
Total Bilirubin: 0.7 mg/dL (ref 0.3–1.2)
Total Protein: 6.4 g/dL — ABNORMAL LOW (ref 6.5–8.1)

## 2022-06-24 LAB — CBC WITH DIFFERENTIAL/PLATELET
Abs Immature Granulocytes: 0 10*3/uL (ref 0.00–0.07)
Basophils Absolute: 0 10*3/uL (ref 0.0–0.1)
Basophils Relative: 0 %
Eosinophils Absolute: 3.9 10*3/uL — ABNORMAL HIGH (ref 0.0–0.5)
Eosinophils Relative: 29 %
HCT: 27.1 % — ABNORMAL LOW (ref 39.0–52.0)
Hemoglobin: 8.4 g/dL — ABNORMAL LOW (ref 13.0–17.0)
Lymphocytes Relative: 13 %
Lymphs Abs: 1.7 10*3/uL (ref 0.7–4.0)
MCH: 30.3 pg (ref 26.0–34.0)
MCHC: 31 g/dL (ref 30.0–36.0)
MCV: 97.8 fL (ref 80.0–100.0)
Monocytes Absolute: 0.7 10*3/uL (ref 0.1–1.0)
Monocytes Relative: 5 %
Neutro Abs: 7.1 10*3/uL (ref 1.7–7.7)
Neutrophils Relative %: 53 %
Platelets: 298 10*3/uL (ref 150–400)
RBC: 2.77 MIL/uL — ABNORMAL LOW (ref 4.22–5.81)
RDW: 14.6 % (ref 11.5–15.5)
WBC: 13.4 10*3/uL — ABNORMAL HIGH (ref 4.0–10.5)
nRBC: 0 % (ref 0.0–0.2)

## 2022-06-24 LAB — CREATININE, SERUM
Creatinine, Ser: 12.27 mg/dL — ABNORMAL HIGH (ref 0.61–1.24)
GFR, Estimated: 4 mL/min — ABNORMAL LOW (ref >60)

## 2022-06-24 LAB — ECHOCARDIOGRAM COMPLETE
Area-P 1/2: 5.66 cm2
Height: 72 in
S' Lateral: 4.1 cm
Weight: 3174.62 oz

## 2022-06-24 LAB — RETICULOCYTES
Immature Retic Fract: 15.9 % (ref 2.3–15.9)
RBC.: 2.14 MIL/uL — ABNORMAL LOW (ref 4.22–5.81)
Retic Count, Absolute: 40.4 10*3/uL (ref 19.0–186.0)
Retic Ct Pct: 1.9 % (ref 0.4–3.1)

## 2022-06-24 LAB — MAGNESIUM: Magnesium: 2.5 mg/dL — ABNORMAL HIGH (ref 1.7–2.4)

## 2022-06-24 LAB — TROPONIN I (HIGH SENSITIVITY): Troponin I (High Sensitivity): 76 ng/L — ABNORMAL HIGH (ref <18)

## 2022-06-24 LAB — HEMOGLOBIN A1C
Hgb A1c MFr Bld: 5.9 % — ABNORMAL HIGH (ref 4.8–5.6)
Mean Plasma Glucose: 122.63 mg/dL

## 2022-06-24 LAB — IRON AND TIBC
Iron: 33 ug/dL — ABNORMAL LOW (ref 45–182)
Saturation Ratios: 21 % (ref 17.9–39.5)
TIBC: 157 ug/dL — ABNORMAL LOW (ref 250–450)
UIBC: 124 ug/dL

## 2022-06-24 LAB — BLOOD GAS, ARTERIAL
Acid-base deficit: 12.4 mmol/L — ABNORMAL HIGH (ref 0.0–2.0)
Bicarbonate: 13.6 mmol/L — ABNORMAL LOW (ref 20.0–28.0)
Drawn by: 38235
O2 Saturation: 99.3 %
Patient temperature: 37
pCO2 arterial: 31 mmHg — ABNORMAL LOW (ref 32–48)
pH, Arterial: 7.25 — ABNORMAL LOW (ref 7.35–7.45)
pO2, Arterial: 116 mmHg — ABNORMAL HIGH (ref 83–108)

## 2022-06-24 LAB — CBC
HCT: 20.6 % — ABNORMAL LOW (ref 39.0–52.0)
Hemoglobin: 6.7 g/dL — CL (ref 13.0–17.0)
MCH: 30.7 pg (ref 26.0–34.0)
MCHC: 32.5 g/dL (ref 30.0–36.0)
MCV: 94.5 fL (ref 80.0–100.0)
Platelets: 235 10*3/uL (ref 150–400)
RBC: 2.18 MIL/uL — ABNORMAL LOW (ref 4.22–5.81)
RDW: 14.6 % (ref 11.5–15.5)
WBC: 11.7 10*3/uL — ABNORMAL HIGH (ref 4.0–10.5)
nRBC: 0 % (ref 0.0–0.2)

## 2022-06-24 LAB — FOLATE: Folate: 6.9 ng/mL (ref >5.9)

## 2022-06-24 LAB — HIV ANTIBODY (ROUTINE TESTING W REFLEX): HIV Screen 4th Generation wRfx: NONREACTIVE

## 2022-06-24 LAB — GLUCOSE, CAPILLARY
Glucose-Capillary: 138 mg/dL — ABNORMAL HIGH (ref 70–99)
Glucose-Capillary: 128 mg/dL — ABNORMAL HIGH (ref 70–99)

## 2022-06-24 LAB — RESP PANEL BY RT-PCR (RSV, FLU A&B, COVID)  RVPGX2
Influenza A by PCR: NEGATIVE
Influenza B by PCR: NEGATIVE
Resp Syncytial Virus by PCR: NEGATIVE
SARS Coronavirus 2 by RT PCR: NEGATIVE

## 2022-06-24 LAB — HEMOGLOBIN AND HEMATOCRIT, BLOOD
HCT: 22.3 % — ABNORMAL LOW (ref 39.0–52.0)
Hemoglobin: 7.4 g/dL — ABNORMAL LOW (ref 13.0–17.0)

## 2022-06-24 LAB — CBG MONITORING, ED: Glucose-Capillary: 129 mg/dL — ABNORMAL HIGH (ref 70–99)

## 2022-06-24 LAB — BRAIN NATRIURETIC PEPTIDE: B Natriuretic Peptide: 2945 pg/mL — ABNORMAL HIGH (ref 0.0–100.0)

## 2022-06-24 LAB — FERRITIN: Ferritin: 457 ng/mL — ABNORMAL HIGH (ref 24–336)

## 2022-06-24 LAB — VITAMIN B12: Vitamin B-12: 837 pg/mL (ref 180–914)

## 2022-06-24 LAB — PREPARE RBC (CROSSMATCH)

## 2022-06-24 LAB — TSH: TSH: 3.169 u[IU]/mL (ref 0.350–4.500)

## 2022-06-24 MED ORDER — ONDANSETRON HCL 4 MG PO TABS: 4.0000 mg | ORAL_TABLET | Freq: Four times a day (QID) | ORAL | Status: DC | PRN

## 2022-06-24 MED ORDER — POTASSIUM CHLORIDE CRYS ER 10 MEQ PO TBCR
10.0000 meq | EXTENDED_RELEASE_TABLET | Freq: Once | ORAL | Status: AC
  Administered 2022-06-24: 10 meq via ORAL
  Filled 2022-06-24: qty 1

## 2022-06-24 MED ORDER — FENTANYL CITRATE PF 50 MCG/ML IJ SOSY: 12.5000 ug | PREFILLED_SYRINGE | INTRAMUSCULAR | Status: DC | PRN

## 2022-06-24 MED ORDER — FUROSEMIDE 10 MG/ML IJ SOLN
80.0000 mg | Freq: Once | INTRAMUSCULAR | Status: AC
  Administered 2022-06-24: 80 mg via INTRAVENOUS
  Filled 2022-06-24: qty 8

## 2022-06-24 MED ORDER — METOLAZONE 2.5 MG PO TABS
5.0000 mg | ORAL_TABLET | Freq: Once | ORAL | Status: AC
  Administered 2022-06-24: 5 mg via ORAL
  Filled 2022-06-24: qty 2

## 2022-06-24 MED ORDER — HYDRALAZINE HCL 20 MG/ML IJ SOLN
10.0000 mg | INTRAMUSCULAR | Status: DC | PRN
  Administered 2022-06-24 – 2022-07-05 (×15): 10 mg via INTRAVENOUS
  Filled 2022-06-24 (×15): qty 1

## 2022-06-24 MED ORDER — POTASSIUM CHLORIDE 10 MEQ/100ML IV SOLN
10.0000 meq | Freq: Once | INTRAVENOUS | Status: AC
  Administered 2022-06-24: 10 meq via INTRAVENOUS
  Filled 2022-06-24: qty 100

## 2022-06-24 MED ORDER — TRAZODONE HCL 50 MG PO TABS: 25.0000 mg | ORAL_TABLET | Freq: Every evening | ORAL | Status: DC | PRN

## 2022-06-24 MED ORDER — METOPROLOL TARTRATE 50 MG PO TABS
50.0000 mg | ORAL_TABLET | Freq: Two times a day (BID) | ORAL | Status: DC
  Administered 2022-06-24 – 2022-06-26 (×4): 50 mg via ORAL
  Filled 2022-06-24 (×4): qty 1

## 2022-06-24 MED ORDER — FUROSEMIDE 10 MG/ML IJ SOLN
120.0000 mg | Freq: Once | INTRAVENOUS | Status: AC
  Administered 2022-06-24: 120 mg via INTRAVENOUS
  Filled 2022-06-24: qty 12

## 2022-06-24 MED ORDER — INSULIN ASPART 100 UNIT/ML IJ SOLN
0.0000 [IU] | Freq: Three times a day (TID) | INTRAMUSCULAR | Status: DC
  Administered 2022-06-26 – 2022-07-01 (×3): 1 [IU] via SUBCUTANEOUS

## 2022-06-24 MED ORDER — METOPROLOL TARTRATE 25 MG PO TABS
25.0000 mg | ORAL_TABLET | Freq: Two times a day (BID) | ORAL | Status: DC
  Administered 2022-06-24: 25 mg via ORAL
  Filled 2022-06-24: qty 1

## 2022-06-24 MED ORDER — HYDRALAZINE HCL 25 MG PO TABS
100.0000 mg | ORAL_TABLET | Freq: Three times a day (TID) | ORAL | Status: DC
  Administered 2022-06-24 – 2022-06-28 (×10): 100 mg via ORAL
  Filled 2022-06-24 (×10): qty 4

## 2022-06-24 MED ORDER — SODIUM CHLORIDE 0.9% IV SOLUTION: Freq: Once | INTRAVENOUS | Status: AC

## 2022-06-24 MED ORDER — LABETALOL HCL 5 MG/ML IV SOLN
15.0000 mg | INTRAVENOUS | Status: DC | PRN
  Administered 2022-06-25 – 2022-07-04 (×11): 15 mg via INTRAVENOUS
  Filled 2022-06-24 (×11): qty 4

## 2022-06-24 MED ORDER — OXYCODONE HCL 5 MG PO TABS: 2.5000 mg | ORAL_TABLET | Freq: Four times a day (QID) | ORAL | Status: DC | PRN

## 2022-06-24 MED ORDER — LIVING BETTER WITH HEART FAILURE BOOK: Freq: Once | Status: AC

## 2022-06-24 MED ORDER — INSULIN ASPART 100 UNIT/ML IJ SOLN
2.0000 [IU] | Freq: Three times a day (TID) | INTRAMUSCULAR | Status: DC
  Administered 2022-06-24 – 2022-07-05 (×21): 2 [IU] via SUBCUTANEOUS

## 2022-06-24 MED ORDER — BISACODYL 5 MG PO TBEC: 5.0000 mg | DELAYED_RELEASE_TABLET | Freq: Every day | ORAL | Status: DC | PRN

## 2022-06-24 MED ORDER — NITROGLYCERIN IN D5W 200-5 MCG/ML-% IV SOLN
0.0000 ug/min | INTRAVENOUS | Status: AC
  Administered 2022-06-24: 100 ug/min via INTRAVENOUS
  Administered 2022-06-24 – 2022-06-25 (×5): 200 ug/min via INTRAVENOUS
  Administered 2022-06-25: 150 ug/min via INTRAVENOUS
  Administered 2022-06-25: 200 ug/min via INTRAVENOUS
  Administered 2022-06-25: 150 ug/min via INTRAVENOUS
  Administered 2022-06-26 (×4): 200 ug/min via INTRAVENOUS
  Administered 2022-06-27: 75 ug/min via INTRAVENOUS
  Administered 2022-06-27: 150 ug/min via INTRAVENOUS
  Administered 2022-06-28: 30 ug/min via INTRAVENOUS
  Filled 2022-06-24 (×17): qty 250

## 2022-06-24 MED ORDER — CHLORHEXIDINE GLUCONATE CLOTH 2 % EX PADS
6.0000 | MEDICATED_PAD | Freq: Every day | CUTANEOUS | Status: DC
  Administered 2022-06-24 – 2022-07-04 (×7): 6 via TOPICAL

## 2022-06-24 MED ORDER — HEPARIN SODIUM (PORCINE) 5000 UNIT/ML IJ SOLN
5000.0000 [IU] | Freq: Three times a day (TID) | INTRAMUSCULAR | Status: DC
  Administered 2022-06-24 – 2022-07-05 (×32): 5000 [IU] via SUBCUTANEOUS
  Filled 2022-06-24 (×33): qty 1

## 2022-06-24 MED ORDER — ACETAMINOPHEN 650 MG RE SUPP: 650.0000 mg | Freq: Four times a day (QID) | RECTAL | Status: DC | PRN

## 2022-06-24 MED ORDER — ACETAMINOPHEN 325 MG PO TABS
650.0000 mg | ORAL_TABLET | Freq: Four times a day (QID) | ORAL | Status: DC | PRN
  Administered 2022-06-26: 650 mg via ORAL
  Filled 2022-06-24: qty 2

## 2022-06-24 MED ORDER — HYDRALAZINE HCL 25 MG PO TABS
50.0000 mg | ORAL_TABLET | Freq: Three times a day (TID) | ORAL | Status: DC
  Administered 2022-06-24 (×2): 50 mg via ORAL
  Filled 2022-06-24 (×3): qty 2

## 2022-06-24 MED ORDER — ONDANSETRON HCL 4 MG/2ML IJ SOLN
4.0000 mg | Freq: Four times a day (QID) | INTRAMUSCULAR | Status: DC | PRN
  Administered 2022-06-24 – 2022-06-27 (×2): 4 mg via INTRAVENOUS
  Filled 2022-06-24 (×2): qty 2

## 2022-06-24 NOTE — Hospital Course (Addendum)
67 year old gentleman with longstanding hypertension since age 73, stage IV CKD, type 2 diabetes mellitus with neuropathy, gout, sleep disorder, history of stroke, congestive heart failure, anemia, chronic edema who established nephrology care with Dr. Trinda Pascal on 06/16/2021.  He apparently was lost to follow up.   He reported that over the past 2 weeks has had progressive symptoms of increasing edema in the lower extremities and poor urine output.  He reports that he noticed less than 2 ounces of urine in the past couple of days.  He is also had a cough and increasing shortness of breath that has worsened over the past 3 days.  He denies chest pain and palpitations.  He has noticed significant increase in the edema in the legs.  It is affecting his ambulation.  He also noticed increased abdominal distention from edema.  He reports that he has been taking his medications for blood pressure.  He reports that he takes amlodipine and hydralazine.  He does have a history of poor compliance with medications.     Patient presented to the emergency department with acute respiratory distress that woke him up this morning.  His pulse ox was 60%.  EMS had him on CPAP.  The ED placed him on BiPAP with improvement in symptoms.  His chest x-ray showed pulmonary edema and bilateral pleural effusions.  His BNP was markedly elevated.  He appears clinically volume overloaded.  He was given IV Lasix with no significant urine output.  His creatinine was elevated at 12.  His blood pressure was markedly elevated and he was placed on an IV nitroglycerin infusion.  Nephrology was consulted.  He is being admitted for management of hypertensive emergency.  Nephrology was consulted, and ultimately, the patient was initiated on HD.  He went through the CLIP process and an outpt dialysis chair was arranged.  Cardiology was consulted for his reduced EF on echo, and his cardiac and anti-HTN meds were introduced and adjusted.

## 2022-06-24 NOTE — Progress Notes (Signed)
*  PRELIMINARY RESULTS* Echocardiogram 2D Echocardiogram has been performed.  Matthew Robles 06/24/2022, 2:03 PM

## 2022-06-24 NOTE — H&P (Addendum)
History and Physical  [facility]  Willett Lefeber XYB:[id number] DOB: 1955-05-28 DOA: 06/24/2022  PCP: Pcp, No  Patient coming from: Home  Level of care: Stepdown  I have personally briefly reviewed patient's old medical records in [facility]  Chief Complaint: SOB   HPI: Zeph Riebel is a 67 year old gentleman with longstanding hypertension since age 25, stage IV CKD, type 2 diabetes mellitus with neuropathy, gout, sleep disorder, history of stroke, congestive heart failure, anemia, chronic edema who established nephrology care with Dr. Molli Barrows on 06/16/2021.  He apparently was lost to follow up.   He reported that over the past 2 weeks has had progressive symptoms of increasing edema in the lower extremities and poor urine output.  He reports that he noticed less than 2 ounces of urine in the past couple of days.  He is also had a cough and increasing shortness of breath that has worsened over the past 3 days.  He denies chest pain and palpitations.  He has noticed significant increase in the edema in the legs.  It is affecting his ambulation.  He also noticed increased abdominal distention from edema.  He reports that he has been taking his medications for blood pressure.  He reports that he takes amlodipine and hydralazine.  He does have a history of poor compliance with medications.     Patient presented to the emergency department with acute respiratory distress that woke him up this morning.  His pulse ox was 60%.  EMS had him on CPAP.  The ED placed him on BiPAP with improvement in symptoms.  His chest x-ray showed pulmonary edema and bilateral pleural effusions.  His BNP was markedly elevated.  He appears clinically volume overloaded.  He was given IV Lasix with no significant urine output.  His creatinine was elevated at 12.  His blood pressure was markedly elevated and he was placed on an IV nitroglycerin infusion.  Nephrology was consulted.  He is being admitted for  management of hypertensive emergency.    Past Medical History:  Diagnosis Date   CHF (congestive heart failure) (HCC)    Diabetes mellitus without complication (HCC)    Hypertension    Stroke (HCC)    2017    Past Surgical History:  Procedure Laterality Date   WISDOM TOOTH EXTRACTION       reports that he quit smoking about 4 years ago. His smoking use included cigarettes. He smoked an average of 1 pack per day. He has never used smokeless tobacco. He reports that he does not currently use alcohol after a past usage of about 4.0 standard drinks of alcohol per week. He reports that he does not currently use drugs after having used the following drugs: Marijuana.  No Known Allergies  Family History  Problem Relation Age of Onset   Diabetes Mother    Hypertension Mother    Diabetes Maternal Grandfather     Prior to Admission medications   Medication Sig Start Date End Date Taking? Authorizing Provider  furosemide (LASIX) 20 MG tablet Take 60 mg by mouth 2 (two) times daily.   Yes [provider]  potassium chloride SA (KLOR-CON) 20 MEQ tablet Take 1 tablet (20 mEq total) by mouth daily. 12/25/20  Yes Mancel Bale, MD  amLODipine (NORVASC) 10 MG tablet Take 1 tablet by mouth once daily Patient not taking: Reported on 06/24/2022 07/28/21   Antoine Poche, MD  aspirin EC 81 MG EC tablet Take 1 tablet (81 mg  total) by mouth daily. Patient not taking: Reported on 12/25/2020 02/26/18   Murlean Iba, MD  hydrALAZINE (APRESOLINE) 100 MG tablet Take 1 tablet (100 mg total) by mouth 3 (three) times daily. Patient not taking: Reported on 06/24/2022 12/25/20   Daleen Bo, MD  insulin glargine (LANTUS) 100 UNIT/ML injection Inject 10 Units into the skin daily. Patient not taking: Reported on 12/25/2020    [provider]  metoprolol succinate (TOPROL-XL) 25 MG 24 hr tablet Take 25 mg by mouth 2 (two) times daily. Patient not taking: Reported on 06/24/2022    [provider]    Physical Exam: Vitals:   06/24/22 0700 06/24/22 0800 06/24/22 0815 06/24/22 0819  BP: (!) 159/89 (!) 153/100 (!) 162/97 (!) 162/97  Pulse: 100 98 97 (!) 101  Resp: 18 (!) 22 (!) 21 (!) 23  Temp:      TempSrc:      SpO2: 95% 93% 100% 97%    Constitutional: NAD, calm, uncomfortable but not distressed, now on HFNC. He is cooperative.  Eyes: PERRL, lids and conjunctivae normal ENMT: Mucous membranes are pale but moist. Posterior pharynx clear of any exudate or lesions.  Neck: normal, supple, no masses, no thyromegaly Respiratory: diffuse crackles heard bilaterally with rales. Mild tachypnea.  Cardiovascular: normal s1, s2 sounds, systolic murmur heard.  3+ pitting lower extremity edema. 2+ pedal pulses.   Abdomen: no tenderness, mild distension, no masses palpated. No hepatosplenomegaly. Bowel sounds positive.  Musculoskeletal: 3+ pitting edema BLEs, no clubbing / cyanosis. No joint deformity upper and lower extremities. Good ROM, no contractures. Normal muscle tone.  Skin: no gross lesions seen. No induration Neurologic: CN 2-12 grossly intact. Sensation intact, DTR normal. Strength 5/5 in all 4.  Psychiatric: judgment and insight unable to determine. Alert and oriented x 3. Normal mood.   Labs on Admission: I have personally reviewed following labs and imaging studies  CBC: Recent Labs  Lab 06/24/22 0532  WBC 13.4*  NEUTROABS 7.1  HGB 8.4*  HCT 27.1*  MCV 97.8  PLT 542   Basic Metabolic Panel: Recent Labs  Lab 06/24/22 0532  NA 133*  K 3.2*  CL 103  CO2 15*  GLUCOSE 228*  BUN 76*  CREATININE 12.15*  CALCIUM 6.7*  MG 2.5*   GFR: CrCl cannot be calculated (Unknown ideal weight.). Liver Function Tests: Recent Labs  Lab 06/24/22 0532  AST 25  ALT 13  ALKPHOS 70  BILITOT 0.7  PROT 6.4*  ALBUMIN 2.0*   No results for input(s): "LIPASE", "AMYLASE" in the last 168 hours. No results for input(s): "AMMONIA" in the last 168 hours. Coagulation  Profile: No results for input(s): "INR", "PROTIME" in the last 168 hours. Cardiac Enzymes: No results for input(s): "CKTOTAL", "CKMB", "CKMBINDEX", "TROPONINI" in the last 168 hours. BNP (last 3 results) No results for input(s): "PROBNP" in the last 8760 hours. HbA1C: No results for input(s): "HGBA1C" in the last 72 hours. CBG: No results for input(s): "GLUCAP" in the last 168 hours. Lipid Profile: No results for input(s): "CHOL", "HDL", "LDLCALC", "TRIG", "CHOLHDL", "LDLDIRECT" in the last 72 hours. Thyroid Function Tests: No results for input(s): "TSH", "T4TOTAL", "FREET4", "T3FREE", "THYROIDAB" in the last 72 hours. Anemia Panel: No results for input(s): "VITAMINB12", "FOLATE", "FERRITIN", "TIBC", "IRON", "RETICCTPCT" in the last 72 hours. Urine analysis:    Component Value Date/Time   COLORURINE STRAW (A) 12/25/2020 2057   APPEARANCEUR CLEAR 12/25/2020 2057   LABSPEC 1.006 12/25/2020 2057   PHURINE 8.0 12/25/2020 2057  GLUCOSEU 50 (A) 12/25/2020 2057   HGBUR SMALL (A) 12/25/2020 2057   BILIRUBINUR NEGATIVE 12/25/2020 2057   KETONESUR NEGATIVE 12/25/2020 2057   PROTEINUR 30 (A) 12/25/2020 2057   NITRITE NEGATIVE 12/25/2020 2057   LEUKOCYTESUR NEGATIVE 12/25/2020 2057    Radiological Exams on Admission: CT Renal Stone Study  Result Date: 06/24/2022 CLINICAL DATA:  Abdominal pain and flank pain with kidney stone suspected in a 67 year old male. EXAM: CT ABDOMEN AND PELVIS WITHOUT CONTRAST TECHNIQUE: Multidetector CT imaging of the abdomen and pelvis was performed following the standard protocol without IV contrast. RADIATION DOSE REDUCTION: This exam was performed according to the departmental dose-optimization program which includes automated exposure control, adjustment of the mA and/or kV according to patient size and/or use of iterative reconstruction technique. COMPARISON:  Renal sonogram from 2019. No recent abdominal imaging for comparison. FINDINGS: Lower chest: Moderately  large RIGHT and moderate LEFT pleural effusions. Septal thickening at the lung bases. Basilar volume loss/consolidation bilaterally. Heart size is enlarged. Low-attenuation cardiac blood pool. No pericardial effusion. Heart is incompletely imaged. No chest wall abnormality. Hepatobiliary: Smooth hepatic contours. No pericholecystic stranding. No gross biliary duct distension. No visible lesion on noncontrast imaging. Small amount of sludge in the gallbladder. Pancreas: Pancreas with normal contours, no signs of inflammation or peripancreatic fluid. Spleen: Normal. Adrenals/Urinary Tract: LEFT adrenal nodule measures 25 Hounsfield units and is 12 x 11 mm. Lobular bilateral renal contours a represent fetal lobation or scarring. No nephrolithiasis. No hydronephrosis. Mild perinephric stranding. No substantial perivesical stranding. No ureteral calculi. Stomach/Bowel: Small hiatal hernia. No stranding adjacent to the stomach. No sign of small bowel obstruction. No sign of small bowel inflammation. Normal appendix. No colonic distension or inflammation. Vascular/Lymphatic: Aortic atherosclerosis. No sign of aneurysm. Smooth contour of the IVC. There is no gastrohepatic or hepatoduodenal ligament lymphadenopathy. No retroperitoneal or mesenteric lymphadenopathy. No pelvic sidewall lymphadenopathy. Calcified atherosclerotic changes are moderate is limited assessment of vascular structures due to lack of intravenous contrast. Reproductive: Unremarkable by CT. Other: Body wall edema. More pronounced periumbilical edema than other areas over the abdomen. Flank edema is symmetric. No substantial intra-abdominal fluid. No pneumoperitoneum. Musculoskeletal: No acute or destructive bone process. Mild spinal degenerative changes. IMPRESSION: 1. Suggestion of heart failure or volume overload in this patient with cardiomegaly, with mild to moderate anasarca associated with bilateral pleural effusions and pulmonary edema. 2. Basilar  airspace disease likely related to volume loss. Would also correlate with signs of infection. 3. More pronounced stranding about the umbilicus of uncertain significance, potentially related to underlying edema but given more concentrated stranding in this area would suggest correlation with any signs of infection/cellulitis. 4. No acute intra-abdominal or pelvic findings. No signs of nephrolithiasis or ureteral calculi. Normal appendix. 5. 1.2 cm left adrenal mass, probable benign adenoma. Recommend follow-up adrenal washout CT in 1 year. If stable for = 1 year, no further follow-up imaging. JACR 2017 Aug; 14(8):1038-44, JCAT 2016 Mar-Apr; 40(2):194-200, Urol J 2006 Spring; 3(2):71-4. Also consider the following. Based on current clinical literature, biochemical evaluation to exclude possible functioning adrenal nodule is suggested if not already performed. Please refer to current clinical guidelines for detailed recommendations. NEJM 1610:960 154-51. 6. Low-attenuation cardiac blood pool, can be seen in the setting of anemia. 7. Small hiatal hernia. 8. Aortic atherosclerosis. Aortic Atherosclerosis (ICD10-I70.0). Electronically Signed   By: Donzetta Kohut M.D.   On: 06/24/2022 08:28   DG Chest Portable 1 View  Result Date: 06/24/2022 CLINICAL DATA:  Evaluate for dyspnea.  History  of CHF EXAM: PORTABLE CHEST 1 VIEW COMPARISON:  12/25/2020 FINDINGS: Chronic cardiomegaly. Small pleural effusions and bilateral airspace disease greater on the right. No pneumothorax. IMPRESSION: CHF pattern. Electronically Signed   By: Jorje Guild M.D.   On: 06/24/2022 05:48    EKG: Independently reviewed.   Assessment/Plan Principal Problem:   Hypertensive emergency Active Problems:   Bilateral pleural effusion   DM (diabetes mellitus), type 2 with neurological complications (HCC)   Hypokalemia   ARF (acute renal failure) (HCC)   Congestive heart failure (HCC)   Noncompliance with medications   Acute respiratory  failure with hypoxia (HCC)   Metabolic acidosis   Anemia in chronic kidney disease (CKD)   Leukocytosis   Acute pulmonary edema (HCC)   Hypoalbuminemia   Elevated brain natriuretic peptide (BNP) level   Hypertensive emergency  - pt presented with markedly elevated BP and significant renal failure - continue IV nitroglycerin for now - admit to stepdown ICU  - IV lasix 120 mg x 1 dose per nephrology team with metolazone - restart hydralazine and metoprolol and hopefully can wean off IV nitro infusion later today  AKI on CKD stage IV - records indicate he was stage IV CKD 1 year ago - he is having poor urinary output at this time - foley catheter in place  - appreciate nephrology consultation - if no response to the IV lasix and metolazone he will likely need to start HD 2/2 - Daily renal function panel ordered  Non Anion Gap Metabolic Acidosis - secondary to acute renal failure  - likely will need to start HD if no improvement  - nephrology team consulting   Acute HFpEF  - LVEF 50-55% with grade 2 DD from 2019 TTE - IV lasix and metolazone as ordered - if no response, plan is for starting hemodialysis 2/2 per nephrology team - update TTE ordered   Acute respiratory failure with hypoxia  - secondary to pulmonary edema, Acute HFpEF - pt responded well to bipap therapy - he is now off bipap therapy but will leave for PRN treatment and QHS treatment - volume removal is the treatment - following closely in stepdown ICU - check for RSV, Covid, Flu   Type 2 Diabetes Mellitus with neuropathy  - Carb modified diet ordered - very sensitive SSI coverage and frequent CBG monitoring at least 5x per day  Leukocytosis - suspect this is reactive - repeating CBC with diff in AM   Hypokalemia - ordering diet now, will let him eat - following daily renal function panel  Bilateral Pleural Effusion  - secondary to volume overload  - treating with diuresis vs hemodialysis   Anemia  in CKD  - Hg down to 8.4 - checking anemia panel - CBC in AM   Critical Care Procedure Note Authorized and Performed by: Murvin Natal MD  Total Critical Care time:  65 mins Due to a high probability of clinically significant, life threatening deterioration, the patient required my highest level of preparedness to intervene emergently and I personally spent this critical care time directly and personally managing the patient.  This critical care time included obtaining a history; examining the patient, pulse oximetry; ordering and review of studies; arranging urgent treatment with development of a management plan; evaluation of patient's response of treatment; frequent reassessment; and discussions with other providers.  This critical care time was performed to assess and manage the high probability of imminent and life threatening deterioration that could result in multi-organ failure.  It  was exclusive of separately billable procedures and treating other patients and teaching time.    DVT prophylaxis: [place] heparin   Code Status: full   Family Communication: updated plan of care with patient at bedside, he verbalized understanding   Disposition Plan: TBD   Consults called: nephrology   Admission status: INP   Level of care: Stepdown Irwin Brakeman MD [facility] How to contact the [facility] Attending or Consulting provider [place] or covering provider during after hours [place], for this patient?  Check the care team in [facility] and look for a) attending/consulting [facility] provider listed and b) the [facility] team listed Log into [email or web address] and use [place]'s universal password to access. If you do not have the password, please contact the hospital operator. Locate the [facility] provider you are looking for under [facility] and page to a number that you can be directly reached. If you still have difficulty reaching the provider, please page the [facility] (Director on Call) for the Hospitalists listed on  amion for assistance.   If 7PM-7AM, please contact night-coverage [email or web address] Password [facility]  06/24/2022, 10:34 AM

## 2022-06-24 NOTE — ED Provider Notes (Signed)
Patient care transferred to me.  CT shows fluid overload which is expected.  He has had a little bit of a cough but no fevers I do not think pneumonia is likely.  He does have significant renal dysfunction and is having minimal urine output.  He is not actually urinated in the whole time he is been here and states he has been having decreased urine output for over a week.  Discussed again with Dr. Royce Macadamia, for now she will come see him and decide on next step in management of his fluids but would like a Foley catheter placed.  Will also admit to the hospital service and per her there is no indication to transfer. Discussed with Dr. Wynetta Emery.   Sherwood Gambler, MD 06/24/22 1536

## 2022-06-24 NOTE — ED Triage Notes (Addendum)
Pt presents with respiratory distress with oxgen at 60% on CPAP. Pt has history of CHF. Woke up SOB this morning.

## 2022-06-24 NOTE — ED Notes (Signed)
No urine noted in canister and pt verbalized he has not had any urine output and has no urge to urinate. Pt verbalized for the past several days he has only urinated about 2 oz during urination.

## 2022-06-24 NOTE — TOC Progression Note (Addendum)
Transition of Care [facility]) - Progression Note    Patient Details  Name: Cal Gindlesperger MRN: [id number] Date of Birth: [birth year]  [facility] of [facility]) CM/SW Contact  Boneta Lucks, RN Phone Number: 06/24/2022, 3:39 PM  Clinical Narrative:   Patient admitted with Hypertensive emergency. TOC following to see if patient will be a new dialysis. Per MD they will assess again tomorrow and update TOC if he will need dialysis. May be temporary. TOC following.   PCP list added to AVS. CHF educate book orders.     Barriers to Discharge: Continued Medical Work up  Expected Discharge Plan and Services      Social Determinants of Health (SDOH) Interventions SDOH Screenings   Food Insecurity: No Food Insecurity (06/24/2022)  Housing: Low Risk  (06/24/2022)  Transportation Needs: No Transportation Needs (06/24/2022)  Utilities: Not At Risk (06/24/2022)  Tobacco Use: Medium Risk (06/24/2022)    Readmission Risk Interventions     No data to display

## 2022-06-24 NOTE — ED Notes (Signed)
Per EDP increase nitro drip to 233mcg

## 2022-06-24 NOTE — ED Notes (Signed)
Pt refusing foley at this time; pt states he would like to do that as the last resort.

## 2022-06-24 NOTE — Progress Notes (Signed)
Transported patient to CT Scan on BIPAP 100% FIO2.  Tolerated well.

## 2022-06-24 NOTE — Consult Note (Addendum)
[facility] Renal Consultation Note  Requesting MD: Merrily Pew, MD Indication for Consultation:  AKI on CKD   Chief complaint: shortness of breath   HPI:  Matthew Robles is a 67 y.o. male with a history of HTN, DM, hx CVA, and CKD stage IV who presented to [facility] with shortness of breath.  He has been placed on BIPAP and then was just transitioned to 15 liters oxygen.  He was given lasix 80 mg IV with no response.  He had a bladder scan with minimal retained and foley was ordered.  He declined.  He previously followed with Dr. Theador Hawthorne but states that he hasn't seen him in a while as his kidney function was abnormal but stable.  He does not have dialysis access in place. He states that his breathing is much better since when he got here.  He's concerned because he still hasn't urinated after getting lasix 80 mg IV this AM.  Can't lie flat.  He hasn't had any sick contacted.  He has had a cough which he attributes to fluid overload.   Creatinine, Ser  Date/Time Value Ref Range Status  06/24/2022 05:32 AM 12.15 (H) 0.61 - 1.24 mg/dL Final  12/25/2020 06:40 PM 3.44 (H) 0.61 - 1.24 mg/dL Final  06/25/2020 10:56 AM 2.63 (H) 0.61 - 1.24 mg/dL Final  10/31/2018 02:57 PM 2.21 (H) 0.61 - 1.24 mg/dL Final  07/07/2018 04:00 PM 2.25 (H) 0.61 - 1.24 mg/dL Final  03/15/2018 02:41 PM 2.27 (H) 0.61 - 1.24 mg/dL Final  02/25/2018 04:33 AM 2.30 (H) 0.61 - 1.24 mg/dL Final  02/24/2018 04:14 AM 2.19 (H) 0.61 - 1.24 mg/dL Final  02/23/2018 04:35 AM 2.47 (H) 0.61 - 1.24 mg/dL Final  02/22/2018 06:50 AM 2.89 (H) 0.61 - 1.24 mg/dL Final  02/21/2018 01:13 PM 2.28 (H) 0.61 - 1.24 mg/dL Final  01/17/2017 10:47 AM 2.24 (H) 0.61 - 1.24 mg/dL Final  01/17/2016 05:48 AM 1.95 (H) 0.61 - 1.24 mg/dL Final  01/16/2016 05:26 AM 2.71 (H) 0.61 - 1.24 mg/dL Final    Comment:    DELTA CHECK NOTED  01/15/2016 05:49 AM 1.30 (H) 0.61 - 1.24 mg/dL Final  01/14/2016 11:20 PM 1.50 (H) 0.61 - 1.24 mg/dL Final      PMHx:   Past Medical History:  Diagnosis Date   CHF (congestive heart failure) ([place])    Diabetes mellitus without complication ([place])    Hypertension    Stroke ([place])    2017    Past Surgical History:  Procedure Laterality Date   WISDOM TOOTH EXTRACTION      Family Hx:  Family History  Problem Relation Age of Onset   Diabetes Mother    Hypertension Mother    Diabetes Maternal Grandfather     Social History:  reports that he quit smoking about 4 years ago. His smoking use included cigarettes. He smoked an average of 1 pack per day. He has never used smokeless tobacco. He reports that he does not currently use alcohol after a past usage of about 4.0 standard drinks of alcohol per week. He reports that he does not currently use drugs after having used the following drugs: Marijuana.  Allergies: No Known Allergies  Medications: Prior to Admission medications   Medication Sig Start Date End Date Taking? Authorizing Provider  amLODipine (NORVASC) 10 MG tablet Take 1 tablet by mouth once daily 07/28/21   Arnoldo Lenis, MD  aspirin EC 81 MG EC tablet Take 1 tablet (81 mg total)  by mouth daily. Patient not taking: No sig reported 02/26/18   Irwin Brakeman L, MD  furosemide (LASIX) 20 MG tablet Take 60 mg by mouth 2 (two) times daily.    [provider]  hydrALAZINE (APRESOLINE) 100 MG tablet Take 1 tablet (100 mg total) by mouth 3 (three) times daily. 12/25/20   Daleen Bo, MD  insulin glargine (LANTUS) 100 UNIT/ML injection Inject 10 Units into the skin daily. Patient not taking: No sig reported    [provider]  metoprolol succinate (TOPROL-XL) 25 MG 24 hr tablet Take 25 mg by mouth 2 (two) times daily.    [provider]  potassium chloride SA (KLOR-CON) 20 MEQ tablet Take 1 tablet (20 mEq total) by mouth daily. 12/25/20   Daleen Bo, MD    I have reviewed the patient's current and reported prior to admission medications.  Labs:      Latest Ref Rng & Units 06/24/2022    5:32 AM 12/25/2020    6:40 PM 06/25/2020   10:56 AM  BMP  Glucose 70 - 99 mg/dL 228  125  105   BUN 8 - 23 mg/dL 76  48  31   Creatinine 0.61 - 1.24 mg/dL 12.15  3.44  2.63   Sodium 135 - 145 mmol/L 133  134  137   Potassium 3.5 - 5.1 mmol/L 3.2  3.8  3.4   Chloride 98 - 111 mmol/L 103  96  99   CO2 22 - 32 mmol/L 15  29  27    Calcium 8.9 - 10.3 mg/dL 6.7  8.6  8.6     Urinalysis    Component Value Date/Time   COLORURINE STRAW (A) 12/25/2020 2057   APPEARANCEUR CLEAR 12/25/2020 2057   LABSPEC 1.006 12/25/2020 2057   PHURINE 8.0 12/25/2020 2057   GLUCOSEU 50 (A) 12/25/2020 2057   HGBUR SMALL (A) 12/25/2020 2057   BILIRUBINUR NEGATIVE 12/25/2020 2057   [place] NEGATIVE 12/25/2020 2057   PROTEINUR 30 (A) 12/25/2020 2057   NITRITE NEGATIVE 12/25/2020 2057   LEUKOCYTESUR NEGATIVE 12/25/2020 2057     ROS:  Pertinent items noted in HPI and remainder of comprehensive ROS otherwise negative.  Physical Exam: Vitals:   06/24/22 0815 06/24/22 0819  BP: (!) 162/97 (!) 162/97  Pulse: 97 (!) 101  Resp: (!) 21 (!) 23  Temp:    SpO2: 100% 97%     General: adult male in stretcher in NAD at rest  HEENT: NCAT Eyes: EOMI sclera anicteric Neck: supple trachea midline  Heart: S1S2 no rub Lungs: reduced on auscultation; unlabored at rest; on 15 liters oxygen Abdomen: soft/nt/nd Extremities: 2+ edema; no cyanosis or clubbing  Skin: no rash on extremities exposed Neuro: alert and oriented x 3 provides hx and follows commands  Psych normal mood and affect  Assessment/Plan:  # AKI  - He may have actually had CKD progression.  We discussed initiating dialysis if no improvement by tomorrow - NPO after midnight tonight in the event dialysis access is needed tomorrow - With decreased urine output and overloaded - diuresis as below  # CKD stage IV - Follows with Dr. Theador Hawthorne - most recent Cr 3.44 from 2022 as above.  Most recently appears to have been  lost to follow-up   # HTN emergency  - on nitro gtt - Lasix 120 mg IV once now  - Metolazone 5 mg PO once now  # Acute hypoxic respiratory failure  - s/p bipap and now on 15 liters  oxygen - attempt diuresis as above - I have asked his team to send covid and RSV screening   # Anemia - normocytic  - PRBC's per primary team - team is giving a unit of blood  - Check iron panel  # Metabolic bone disease - Hypocalcemia - Check intact PTH  Disposition - continue inpatient monitoring and anticipate will need dialysis initiation    Claudia Desanctis 06/24/2022, 1:12 PM   Addendum:  Hypokalemia - replete gently potassium 20 meq once   Claudia Desanctis, MD 1:14 PM 06/24/2022

## 2022-06-24 NOTE — Discharge Instructions (Signed)
  Providers Accepting New Patients in [place], [place]    [facility] [address]  [place], [place] [address] [phone] Accepts most insurances  [facility] [address] [place], [place] [address] [phone] Accepts most insurances  [facility] [place] [address] [place], [place] [address]  [phone] Must meet requirements  [facility] [address]  [place], [place] [address] [phone] Accepts most insurances  [facility] [address]  [place], [place] [address] [phone] Accepts most insurances  [facility] [address]   [place], [place]   [phone] Accepts most insurances  [facility] ([facility])  [address]  [place], [place] [address] [phone] Accepts most insurances     [facility] [address]  [place], [place] [address] [phone] Accepts most insurances  [facility] [facility] [address] [place], [place] [address] [phone] option 1 Accepts Medicaid and Uninsured  [facility] [address]  [place], [place] [address] [phone] Accepts most insurances  Tesfaye Fanta, MD [address]  [place], [place] [address] [phone] Accepts most insurances  [facility] [place] [address]  [place], [place] [address] [phone] Accepts most insurances  [facility] [address] [place], [place] [address] [phone] Accepts most insurances  Zack Hall, MD [address] [place],  [address] [phone]  Accepts most insurances                    

## 2022-06-24 NOTE — ED Provider Notes (Signed)
[facility] Provider Note   CSN: [id number] Arrival date & time: 06/24/22  2595     History  Chief Complaint  Patient presents with   Respiratory Distress    Matthew Robles is a 67 y.o. male.  67 yo M comes in via EMS 2/2 ARD with LE edema, crackles. Apparently suddenly worsened this AM. EMS with cpap 100% and still satting in 60's. Hypertension, tachycardic. No pain. No recent illnesses.  As symptoms improved his MS improved and he was able to relay that he has had worsening edema for a couple weeks and is now having minimal to no urinary output. Was seen for CKD but last time he was seen was almost a year ago it seems.         Home Medications Prior to Admission medications   Medication Sig Start Date End Date Taking? Authorizing Provider  amLODipine (NORVASC) 10 MG tablet Take 1 tablet by mouth once daily 07/28/21   Arnoldo Lenis, MD  aspirin EC 81 MG EC tablet Take 1 tablet (81 mg total) by mouth daily. Patient not taking: No sig reported 02/26/18   Irwin Brakeman L, MD  furosemide (LASIX) 40 MG tablet Take 1 tablet (40 mg total) by mouth 2 (two) times daily. 12/25/20 03/25/21  Daleen Bo, MD  hydrALAZINE (APRESOLINE) 100 MG tablet Take 1 tablet (100 mg total) by mouth 3 (three) times daily. 12/25/20   Daleen Bo, MD  insulin glargine (LANTUS) 100 UNIT/ML injection Inject 10 Units into the skin daily. Patient not taking: No sig reported    [provider]  labetalol (NORMODYNE) 100 MG tablet Take 2 tablets (200 mg total) by mouth 2 (two) times daily. Patient not taking: No sig reported 06/30/20   Arnoldo Lenis, MD  potassium chloride SA (KLOR-CON) 20 MEQ tablet Take 1 tablet (20 mEq total) by mouth daily. 12/25/20   Daleen Bo, MD      Allergies    Patient has no known allergies.    Review of Systems   Review of Systems  Physical Exam Updated Vital Signs BP (!) 192/110   Pulse (!) 109   Resp (!)  22   SpO2 95%  Physical Exam Vitals and nursing note reviewed.  Constitutional:      General: He is in acute distress.     Appearance: He is well-developed. He is ill-appearing.  HENT:     Head: Normocephalic and atraumatic.     Mouth/Throat:     Mouth: Mucous membranes are moist.  Eyes:     Pupils: Pupils are equal, round, and reactive to light.  Cardiovascular:     Rate and Rhythm: Tachycardia present.  Pulmonary:     Effort: Respiratory distress (diminished breath sounds throughout) present.     Comments: Hypoxic, tachypneic Abdominal:     General: Abdomen is flat. There is no distension.  Musculoskeletal:        General: Normal range of motion.     Cervical back: Normal range of motion.  Skin:    General: Skin is warm and dry.  Neurological:     Mental Status: He is alert.     ED Results / Procedures / Treatments   Labs (all labs ordered are listed, but only abnormal results are displayed) Labs Reviewed  CBC WITH DIFFERENTIAL/PLATELET  COMPREHENSIVE METABOLIC PANEL  BRAIN NATRIURETIC PEPTIDE  I-STAT ARTERIAL BLOOD GAS, ED  TROPONIN I (HIGH SENSITIVITY)    EKG None  Radiology  No results found.  Procedures .Critical Care  Performed by: Merrily Pew, MD Authorized by: Merrily Pew, MD   Critical care provider statement:    Critical care time (minutes):  30   Critical care was necessary to treat or prevent imminent or life-threatening deterioration of the following conditions:  Respiratory failure and cardiac failure   Critical care was time spent personally by me on the following activities:  Development of treatment plan with patient or surrogate, discussions with consultants, evaluation of patient's response to treatment, examination of patient, ordering and review of laboratory studies, ordering and review of radiographic studies, ordering and performing treatments and interventions, pulse oximetry, re-evaluation of patient's condition and review of old  charts     Medications Ordered in ED Medications  nitroGLYCERIN 50 mg in dextrose 5 % 250 mL (0.2 mg/mL) infusion (has no administration in time range)  furosemide (LASIX) injection 80 mg (has no administration in time range)    ED Course/ Medical Decision Making/ A&P                             Medical Decision Making Amount and/or Complexity of Data Reviewed Labs: ordered. Radiology: ordered.  Risk Prescription drug management. Decision regarding hospitalization.  Xray appears c/w pulm edema vs less likely atypical infection on my interpretation. In association with hypoxia, hypertension, edema most likely CHF exacerbation.  Lasix ordered NTG infusion initiated Bipap started  Creatinine came back markedly elevated. Fluid overloaded on exam. CXR interpreted by myself and radiology read reviewed as well as likely pulmonary edema. Lasix given.   Symptoms improving. Nephrology will see patient, states he is ok to be admitted at [facility] even if they have to initiate dialysis.   Care transferred to Dr. Regenia Skeeter pending nephro eval and recs.   Final Clinical Impression(s) / ED Diagnoses Final diagnoses:  None    Rx / DC Orders ED Discharge Orders     None         Corrissa Martello, Corene Cornea, MD 06/25/22 [phone]

## 2022-06-24 NOTE — Progress Notes (Signed)
Patient placed on full face auto cpap with 10L oxygen.  Patient tolerating well at this time.

## 2022-06-24 NOTE — Progress Notes (Signed)
Date and time results received: 06/24/22 1210 (use smartphrase ".now" to insert current time)  Test: Hgb Critical Value: 6.7  Name of Provider Notified: Wynetta Emery  Orders Received? Or Actions Taken?: awaiting orders

## 2022-06-25 ENCOUNTER — Inpatient Hospital Stay (HOSPITAL_COMMUNITY): Payer: Self-pay

## 2022-06-25 ENCOUNTER — Other Ambulatory Visit: Payer: Self-pay

## 2022-06-25 ENCOUNTER — Inpatient Hospital Stay (HOSPITAL_COMMUNITY): Payer: Self-pay | Admitting: Anesthesiology

## 2022-06-25 ENCOUNTER — Encounter (HOSPITAL_COMMUNITY): Payer: Self-pay | Admitting: Family Medicine

## 2022-06-25 ENCOUNTER — Encounter (HOSPITAL_COMMUNITY)
Admission: EM | Disposition: A | Discharge status: Home / Self Care | Payer: Self-pay | Source: Home / Self Care | Attending: Family Medicine

## 2022-06-25 DIAGNOSIS — I5032 Chronic diastolic (congestive) heart failure: Secondary | ICD-10-CM

## 2022-06-25 DIAGNOSIS — N186 End stage renal disease: Secondary | ICD-10-CM

## 2022-06-25 DIAGNOSIS — J9601 Acute respiratory failure with hypoxia: Secondary | ICD-10-CM

## 2022-06-25 HISTORY — PX: INSERTION OF DIALYSIS CATHETER: SHX1324

## 2022-06-25 LAB — RENAL FUNCTION PANEL
Albumin: 1.8 g/dL — ABNORMAL LOW (ref 3.5–5.0)
Anion gap: 9 (ref 5–15)
BUN: 78 mg/dL — ABNORMAL HIGH (ref 8–23)
CO2: 15 mmol/L — ABNORMAL LOW (ref 22–32)
Calcium: 6.3 mg/dL — CL (ref 8.9–10.3)
Chloride: 104 mmol/L (ref 98–111)
Creatinine, Ser: 12.34 mg/dL — ABNORMAL HIGH (ref 0.61–1.24)
GFR, Estimated: 4 mL/min — ABNORMAL LOW (ref >60)
Glucose, Bld: 102 mg/dL — ABNORMAL HIGH (ref 70–99)
Phosphorus: 7.8 mg/dL — ABNORMAL HIGH (ref 2.5–4.6)
Potassium: 3.2 mmol/L — ABNORMAL LOW (ref 3.5–5.1)
Sodium: 128 mmol/L — ABNORMAL LOW (ref 135–145)

## 2022-06-25 LAB — MISC LABCORP TEST (SEND OUT): Labcorp test code: 81950

## 2022-06-25 LAB — HEPATITIS B SURFACE ANTIGEN: Hepatitis B Surface Ag: NONREACTIVE

## 2022-06-25 LAB — CBC WITH DIFFERENTIAL/PLATELET
Abs Immature Granulocytes: 0.05 10*3/uL (ref 0.00–0.07)
Basophils Absolute: 0.1 10*3/uL (ref 0.0–0.1)
Basophils Relative: 1 %
Eosinophils Absolute: 1.8 10*3/uL — ABNORMAL HIGH (ref 0.0–0.5)
Eosinophils Relative: 16 %
HCT: 22.7 % — ABNORMAL LOW (ref 39.0–52.0)
Hemoglobin: 7.4 g/dL — ABNORMAL LOW (ref 13.0–17.0)
Immature Granulocytes: 1 %
Lymphocytes Relative: 7 %
Lymphs Abs: 0.7 10*3/uL (ref 0.7–4.0)
MCH: 30.3 pg (ref 26.0–34.0)
MCHC: 32.6 g/dL (ref 30.0–36.0)
MCV: 93 fL (ref 80.0–100.0)
Monocytes Absolute: 1.1 10*3/uL — ABNORMAL HIGH (ref 0.1–1.0)
Monocytes Relative: 10 %
Neutro Abs: 7.1 10*3/uL (ref 1.7–7.7)
Neutrophils Relative %: 65 %
Platelets: 198 10*3/uL (ref 150–400)
RBC: 2.44 MIL/uL — ABNORMAL LOW (ref 4.22–5.81)
RDW: 15 % (ref 11.5–15.5)
WBC: 10.9 10*3/uL — ABNORMAL HIGH (ref 4.0–10.5)
nRBC: 0 % (ref 0.0–0.2)

## 2022-06-25 LAB — GLUCOSE, CAPILLARY
Glucose-Capillary: 89 mg/dL (ref 70–99)
Glucose-Capillary: 125 mg/dL — ABNORMAL HIGH (ref 70–99)
Glucose-Capillary: 115 mg/dL — ABNORMAL HIGH (ref 70–99)
Glucose-Capillary: 127 mg/dL — ABNORMAL HIGH (ref 70–99)
Glucose-Capillary: 121 mg/dL — ABNORMAL HIGH (ref 70–99)
Glucose-Capillary: 142 mg/dL — ABNORMAL HIGH (ref 70–99)

## 2022-06-25 LAB — PATHOLOGIST SMEAR REVIEW

## 2022-06-25 LAB — MAGNESIUM: Magnesium: 2.3 mg/dL (ref 1.7–2.4)

## 2022-06-25 LAB — BRAIN NATRIURETIC PEPTIDE: B Natriuretic Peptide: 2536 pg/mL — ABNORMAL HIGH (ref 0.0–100.0)

## 2022-06-25 SURGERY — INSERTION OF DIALYSIS CATHETER
Anesthesia: Monitor Anesthesia Care | Laterality: Right

## 2022-06-25 MED ORDER — LIDOCAINE HCL (PF) 2 % IJ SOLN
INTRAMUSCULAR | Status: AC
  Filled 2022-06-25: qty 5

## 2022-06-25 MED ORDER — CHLORHEXIDINE GLUCONATE 0.12 % MT SOLN
15.0000 mL | Freq: Once | OROMUCOSAL | Status: AC
  Administered 2022-06-25: 15 mL via OROMUCOSAL

## 2022-06-25 MED ORDER — GLYCOPYRROLATE PF 0.2 MG/ML IJ SOSY
PREFILLED_SYRINGE | INTRAMUSCULAR | Status: AC
  Filled 2022-06-25: qty 1

## 2022-06-25 MED ORDER — CEFAZOLIN SODIUM-DEXTROSE 2-4 GM/100ML-% IV SOLN
2.0000 g | INTRAVENOUS | Status: AC
  Administered 2022-06-25: 2 g via INTRAVENOUS
  Filled 2022-06-25: qty 100

## 2022-06-25 MED ORDER — MIDAZOLAM HCL 5 MG/5ML IJ SOLN
INTRAMUSCULAR | Status: DC | PRN
  Administered 2022-06-25: .5 mg via INTRAVENOUS

## 2022-06-25 MED ORDER — CHLORHEXIDINE GLUCONATE CLOTH 2 % EX PADS
6.0000 | MEDICATED_PAD | Freq: Every day | CUTANEOUS | Status: DC
  Administered 2022-06-25: 6 via TOPICAL

## 2022-06-25 MED ORDER — HEPARIN SODIUM (PORCINE) 1000 UNIT/ML IJ SOLN
INTRAMUSCULAR | Status: DC | PRN
  Administered 2022-06-25: 4000 [IU] via INTRAVENOUS

## 2022-06-25 MED ORDER — METOCLOPRAMIDE HCL 5 MG/ML IJ SOLN
10.0000 mg | Freq: Once | INTRAMUSCULAR | Status: AC
  Administered 2022-06-25: 10 mg via INTRAVENOUS
  Filled 2022-06-25: qty 2

## 2022-06-25 MED ORDER — HEPARIN SODIUM (PORCINE) 1000 UNIT/ML IJ SOLN
INTRAMUSCULAR | Status: AC
  Filled 2022-06-25: qty 4

## 2022-06-25 MED ORDER — FUROSEMIDE 10 MG/ML IJ SOLN
80.0000 mg | Freq: Every day | INTRAMUSCULAR | Status: DC
  Administered 2022-06-25 – 2022-07-04 (×10): 80 mg via INTRAVENOUS
  Filled 2022-06-25 (×10): qty 8

## 2022-06-25 MED ORDER — LIDOCAINE HCL (CARDIAC) PF 100 MG/5ML IV SOSY
PREFILLED_SYRINGE | INTRAVENOUS | Status: DC | PRN
  Administered 2022-06-25: 50 mg via INTRATRACHEAL

## 2022-06-25 MED ORDER — IPRATROPIUM-ALBUTEROL 0.5-2.5 (3) MG/3ML IN SOLN
3.0000 mL | Freq: Once | RESPIRATORY_TRACT | Status: AC
  Administered 2022-06-25: 3 mL via RESPIRATORY_TRACT

## 2022-06-25 MED ORDER — IPRATROPIUM-ALBUTEROL 0.5-2.5 (3) MG/3ML IN SOLN
RESPIRATORY_TRACT | Status: AC
  Filled 2022-06-25: qty 3

## 2022-06-25 MED ORDER — FENTANYL CITRATE (PF) 100 MCG/2ML IJ SOLN
INTRAMUSCULAR | Status: DC | PRN
  Administered 2022-06-25: 25 ug via INTRAVENOUS
  Administered 2022-06-25: 15 ug via INTRAVENOUS

## 2022-06-25 MED ORDER — ALTEPLASE 2 MG IJ SOLR: 2.0000 mg | Freq: Once | INTRAMUSCULAR | Status: DC | PRN

## 2022-06-25 MED ORDER — LIDOCAINE HCL (PF) 1 % IJ SOLN
INTRAMUSCULAR | Status: AC
  Filled 2022-06-25: qty 30

## 2022-06-25 MED ORDER — SODIUM CHLORIDE (PF) 0.9 % IJ SOLN
INTRAMUSCULAR | Status: DC | PRN
  Administered 2022-06-25: 50 mL via INTRAVENOUS

## 2022-06-25 MED ORDER — CHLORHEXIDINE GLUCONATE CLOTH 2 % EX PADS
6.0000 | MEDICATED_PAD | Freq: Every day | CUTANEOUS | Status: DC
  Administered 2022-06-26 – 2022-07-05 (×10): 6 via TOPICAL

## 2022-06-25 MED ORDER — FENTANYL CITRATE PF 50 MCG/ML IJ SOSY: 25.0000 ug | PREFILLED_SYRINGE | INTRAMUSCULAR | Status: DC | PRN

## 2022-06-25 MED ORDER — CHLORHEXIDINE GLUCONATE CLOTH 2 % EX PADS: 6.0000 | MEDICATED_PAD | Freq: Once | CUTANEOUS | Status: AC

## 2022-06-25 MED ORDER — MIDAZOLAM HCL 2 MG/2ML IJ SOLN
INTRAMUSCULAR | Status: AC
  Filled 2022-06-25: qty 2

## 2022-06-25 MED ORDER — PROPOFOL 10 MG/ML IV BOLUS
INTRAVENOUS | Status: AC
  Filled 2022-06-25: qty 20

## 2022-06-25 MED ORDER — FENTANYL CITRATE (PF) 100 MCG/2ML IJ SOLN
INTRAMUSCULAR | Status: AC
  Filled 2022-06-25: qty 2

## 2022-06-25 MED ORDER — CALCIUM GLUCONATE-NACL 1-0.675 GM/50ML-% IV SOLN
1.0000 g | Freq: Once | INTRAVENOUS | Status: AC
  Administered 2022-06-25: 1000 mg via INTRAVENOUS
  Filled 2022-06-25: qty 50

## 2022-06-25 MED ORDER — HEPARIN SODIUM (PORCINE) 1000 UNIT/ML DIALYSIS
1000.0000 [IU] | INTRAMUSCULAR | Status: DC | PRN
  Administered 2022-06-25 – 2022-07-05 (×5): 3800 [IU]

## 2022-06-25 MED ORDER — SODIUM CHLORIDE 0.9 % IV SOLN: INTRAVENOUS | Status: DC

## 2022-06-25 MED ORDER — LIDOCAINE HCL (PF) 1 % IJ SOLN
INTRAMUSCULAR | Status: DC | PRN
  Administered 2022-06-25: 20 mL

## 2022-06-25 MED ORDER — DEXMEDETOMIDINE HCL IN NACL 80 MCG/20ML IV SOLN
INTRAVENOUS | Status: AC
  Filled 2022-06-25: qty 20

## 2022-06-25 MED ORDER — ORAL CARE MOUTH RINSE: 15.0000 mL | Freq: Once | OROMUCOSAL | Status: AC

## 2022-06-25 MED ORDER — POTASSIUM CHLORIDE CRYS ER 20 MEQ PO TBCR
20.0000 meq | EXTENDED_RELEASE_TABLET | Freq: Once | ORAL | Status: AC
  Administered 2022-06-25: 20 meq via ORAL
  Filled 2022-06-25: qty 1

## 2022-06-25 SURGICAL SUPPLY — 42 items
APPLICATOR CHLORAPREP 10.5 ORG (MISCELLANEOUS) ×1 IMPLANT
BAG DECANTER FOR FLEXI CONT (MISCELLANEOUS) ×1 IMPLANT
BIOPATCH RED 1 DISK 7.0 (GAUZE/BANDAGES/DRESSINGS) ×1 IMPLANT
CATH PALINDROME-P 19CM W/VT (CATHETERS) IMPLANT
CATH PALINDROME-P 23CM W/VT (CATHETERS) IMPLANT
COVER LIGHT HANDLE STERIS (MISCELLANEOUS) ×2 IMPLANT
COVER PROBE U/S 5X48 (MISCELLANEOUS) ×1 IMPLANT
DECANTER SPIKE VIAL GLASS SM (MISCELLANEOUS) ×2 IMPLANT
DERMABOND ADVANCED .7 DNX12 (GAUZE/BANDAGES/DRESSINGS) ×1 IMPLANT
DRAPE C-ARM FOLDED MOBILE STRL (DRAPES) ×1 IMPLANT
DRAPE CHEST BREAST 15X10 FENES (DRAPES) ×1 IMPLANT
DRSG SORBAVIEW 3.5X5-5/16 MED (GAUZE/BANDAGES/DRESSINGS) ×1 IMPLANT
DRSG TEGADERM 4X4.75 (GAUZE/BANDAGES/DRESSINGS) IMPLANT
ELECT REM PT RETURN 9FT ADLT (ELECTROSURGICAL) ×1
ELECTRODE REM PT RTRN 9FT ADLT (ELECTROSURGICAL) ×1 IMPLANT
GAUZE 4X4 16PLY ~~LOC~~+RFID DBL (SPONGE) ×1 IMPLANT
GEL ULTRASOUND 20GR AQUASONIC (MISCELLANEOUS) ×1 IMPLANT
GLOVE BIO SURGEON STRL SZ 6.5 (GLOVE) ×1 IMPLANT
GLOVE BIOGEL PI IND STRL 6.5 (GLOVE) ×1 IMPLANT
GLOVE BIOGEL PI IND STRL 7.0 (GLOVE) ×2 IMPLANT
GOWN STRL REUS W/TWL LRG LVL3 (GOWN DISPOSABLE) ×2 IMPLANT
IV CONNECTOR ONE LINK NDLESS (IV SETS) IMPLANT
IV NS 500ML (IV SOLUTION) ×1
IV NS 500ML BAXH (IV SOLUTION) ×1 IMPLANT
KIT BLADEGUARD II DBL (SET/KITS/TRAYS/PACK) ×1 IMPLANT
KIT TURNOVER KIT A (KITS) ×1 IMPLANT
MARKER SKIN DUAL TIP RULER LAB (MISCELLANEOUS) ×1 IMPLANT
NDL HYPO 18GX1.5 BLUNT FILL (NEEDLE) ×1 IMPLANT
NDL HYPO 25X1 1.5 SAFETY (NEEDLE) ×1 IMPLANT
NEEDLE HYPO 18GX1.5 BLUNT FILL (NEEDLE) ×1 IMPLANT
NEEDLE HYPO 25X1 1.5 SAFETY (NEEDLE) ×1 IMPLANT
PACK BASIC III (CUSTOM PROCEDURE TRAY) ×1
PACK SRG BSC III STRL LF ECLPS (CUSTOM PROCEDURE TRAY) ×1 IMPLANT
PAD ARMBOARD 7.5X6 YLW CONV (MISCELLANEOUS) ×1 IMPLANT
PENCIL SMOKE EVACUATOR COATED (MISCELLANEOUS) ×1 IMPLANT
SET BASIN LINEN APH (SET/KITS/TRAYS/PACK) ×1 IMPLANT
SUT MNCRL AB 4-0 PS2 18 (SUTURE) ×1 IMPLANT
SUT SILK 2 0 FSL 18 (SUTURE) ×1 IMPLANT
SUT VIC AB 3-0 SH 27 (SUTURE) ×1
SUT VIC AB 3-0 SH 27X BRD (SUTURE) ×1 IMPLANT
SYR 10ML LL (SYRINGE) ×2 IMPLANT
SYR CONTROL 10ML LL (SYRINGE) ×1 IMPLANT

## 2022-06-25 NOTE — Progress Notes (Signed)
[facility]  Cxr with catheter in good position, no obvious ptx.  [name], MD [facility] [address] [place], [place] [address] [phone] (office)

## 2022-06-25 NOTE — Anesthesia Postprocedure Evaluation (Signed)
Anesthesia Post Note  Patient: Matthew Robles  Procedure(s) Performed: INSERTION OF DIALYSIS CATHETER (Right)  Patient location during evaluation: PACU Anesthesia Type: MAC Level of consciousness: awake and alert, oriented and patient cooperative (level of consciousness same as preop) Pain management: pain level controlled Vital Signs Assessment: post-procedure vital signs reviewed and stable Respiratory status: spontaneous breathing, nonlabored ventilation, respiratory function stable and patient connected to nasal cannula oxygen Cardiovascular status: blood pressure returned to baseline and stable Postop Assessment: no apparent nausea or vomiting Anesthetic complications: no  No notable events documented.   Last Vitals:  Vitals:   06/25/22 1830 06/25/22 1845  BP: (!) 149/87 (!) 151/86  Pulse: 84 82  Resp: 15 13  Temp:    SpO2: 96% 96%    Last Pain:  Vitals:   06/25/22 1845  TempSrc:   PainSc: 0-No pain                 [name] C Donaldo Teegarden

## 2022-06-25 NOTE — Transfer of Care (Signed)
Immediate Anesthesia Transfer of Care Note  Patient: Matthew Robles  Procedure(s) Performed: INSERTION OF DIALYSIS CATHETER (Right)  Patient Location: PACU  Anesthesia Type:General  Level of Consciousness: awake, alert , oriented, and patient cooperative  Airway & Oxygen Therapy: Patient Spontanous Breathing and Patient connected to face mask oxygen  Post-op Assessment: Report given to RN and Post -op Vital signs reviewed and stable  Post vital signs: Reviewed and stable  Last Vitals:  Vitals Value Taken Time  BP 149/87 06/25/22 1830  Temp 97.9 06/25/22 1830  Pulse 84 06/25/22 1830  Resp 15 06/25/22  1830  SpO2 96 % 06/25/22 1830  Vitals shown include unvalidated device data.  Last Pain:  Vitals:   06/25/22 1400  TempSrc:   PainSc: 0-No pain         Complications: No notable events documented.

## 2022-06-25 NOTE — Op Note (Signed)
Operative Note 06/25/22   Preoperative Diagnosis: End Stage Renal Disease    Postoperative Diagnosis: Same   Procedure(s) Performed: Tunneled Dialysis Catheter Placement, Right Internal Jugular    Surgeon: Matthew Matar. Constance Haw, MD   Assistants: No qualified resident was available   Anesthesia: Monitored anesthesia care   Anesthesiologist: Denese Killings, MD    Specimens: None   Estimated Blood Loss: Minimal   Fluoroscopy time: 13 seconds   Blood Replacement: None    Complications: None    Operative Findings: Normal anatomy  Indications: [name] is a 67 yo with worsening renal failure that needs dialysis. We discussed risk of bleeding, infection, injury to vessels, pneumothorax, malfunction.   Procedure: The patient was brought into the operating room and monitor anesthesia care was induced.   The right chest and neck was prepped and draped in the usual sterile fashion.  Preoperative antibiotics were given.   An Ultrasound was used to verify that the right internal jugular vein was patent.  One percent lidocaine was used for local anesthesia.  The patient was measured and a 23 cm Palindrome dual lumen dialysis catheter.  The needles advanced into the right internal jugular vein using the Seldinger technique without difficulty.  A guidewire was then advanced into the right atrium under fluoroscopic guidance.  Ectopia was noted and the wire was pulled back.  The wire was secured.  An incision was made over the right chest and the catheter was tunneled to the neck.  The ultrasound again confirmed the wire was going into the vein only. Dilators were used over the wire to dilate the track.  An introducer and peel-away sheath were placed over the guidewire. The catheter was then inserted through the peel-away sheath and the peel-away sheath was removed.  A spot film was performed to confirm the position.  The catheter drew back and flushed easily. The lumens were packed with heparin.  Hemostats were used to position the catheter in the neck incision. The neck incision was closed with 4-0 Monocryl and Dermabond. The catheter was secured with 2-0 silk suture and a sterile Biopatch and dressing was applied.  Hemostasis was confirmed.     All tape and needle counts were correct at the end of the procedure. The patient was transferred to PACU in stable condition. A chest x-ray will be performed at that time.  [name], MD [facility] [address] [place], [place] [address] [phone] (office)

## 2022-06-25 NOTE — Anesthesia Preprocedure Evaluation (Addendum)
Anesthesia Evaluation  Patient identified by MRN, date of birth, ID band Patient awake    Reviewed: Allergy & Precautions, H&P , NPO status , Patient's Chart, lab work & pertinent test results, reviewed documented beta blocker date and time   History of Anesthesia Complications Negative for: history of anesthetic complications  Airway Mallampati: III  TM Distance: >3 FB Neck ROM: Full    Dental  (+) Dental Advisory Given, Missing   Pulmonary shortness of breath (on oxygen 10 l/min), former smoker Pulmonary edema, Bilateral pleural effusion   breath sounds clear to auscultation       Cardiovascular Exercise Tolerance: Poor hypertension, Pt. on medications and Pt. on home beta blockers +CHF   Rhythm:Regular Rate:Normal + Friction Rub  1. Left ventricular ejection fraction, by estimation, is 45 to 50%. The  left ventricle has mildly decreased function. The left ventricle  demonstrates global hypokinesis. There is moderate concentric left  ventricular hypertrophy. Left ventricular  diastolic parameters are consistent with Grade II diastolic dysfunction  (pseudonormalization).   2. Right ventricular systolic function is mildly reduced. The right  ventricular size is normal. Tricuspid regurgitation signal is inadequate  for assessing PA pressure.   3. Left atrial size was severely dilated.   4. Right atrial size was moderately dilated.   5. Left pleural effusion noted.   6. The mitral valve is degenerative. Mild mitral valve regurgitation.   7. The aortic valve is tricuspid. There is mild calcification of the  aortic valve. Aortic valve regurgitation is trivial. Aortic valve  sclerosis/calcification is present, without any evidence of aortic  stenosis.   8. The inferior vena cava is dilated in size with >50% respiratory  variability, suggesting right atrial pressure of 8 mmHg.     Neuro/Psych CVA  negative psych ROS    GI/Hepatic negative GI ROS, Neg liver ROS,,,  Endo/Other  diabetes, Well Controlled, Type 2, Oral Hypoglycemic Agents    Renal/GU ESRF and CRFRenal disease  negative genitourinary   Musculoskeletal negative musculoskeletal ROS (+)    Abdominal   Peds negative pediatric ROS (+)  Hematology  (+) Blood dyscrasia, anemia   Anesthesia Other Findings   Reproductive/Obstetrics negative OB ROS                             Anesthesia Physical Anesthesia Plan  ASA: 4 and emergent  Anesthesia Plan: MAC   Post-op Pain Management: Minimal or no pain anticipated   Induction: Intravenous  PONV Risk Score and Plan: 1 and Ondansetron and Metaclopromide  Airway Management Planned: Nasal Cannula and Natural Airway  Additional Equipment:   Intra-op Plan:   Post-operative Plan:   Informed Consent: I have reviewed the patients History and Physical, chart, labs and discussed the procedure including the risks, benefits and alternatives for the proposed anesthesia with the patient or authorized representative who has indicated his/her understanding and acceptance.     Dental advisory given  Plan Discussed with: CRNA and Surgeon  Anesthesia Plan Comments:        Anesthesia Quick Evaluation

## 2022-06-25 NOTE — Progress Notes (Signed)
[facility] Progress Note  Name: Matthew Robles MRN: [id number] DOB: April 19, 1956  Chief Complaint:  Shortness of breath  Subjective:  He was admitted to the ICU.  On CPAP overnight.  He had 750 mL uop over 2/1 as well as one unmeasured urine void.  He shared that he thought he was going to die before he came to the hospital.  Patient received PRBC's on 2/1  Review of systems:  Shortness of breath is better but still present.  He can't turn on his side  Foley catheter is in place and is uncomfortable  Denies n/v  Has been NPO for procedure -------------- Background on consult:  Matthew Robles is a 67 y.o. male with a history of HTN, DM, hx CVA, and CKD stage IV who presented to [facility] with shortness of breath.  He has been placed on BIPAP and then was just transitioned to 15 liters oxygen.  He was given lasix 80 mg IV with no response.  He had a bladder scan with minimal retained and foley was ordered.  He declined.  He previously followed with Dr. Theador Hawthorne but states that he hasn't seen him in a while as his kidney function was abnormal but stable.  He does not have dialysis access in place. He states that his breathing is much better since when he got here.  He's concerned because he still hasn't urinated after getting lasix 80 mg IV this AM.  Can't lie flat.  He hasn't had any sick contacted.  He has had a cough which he attributes to fluid overload.     Intake/Output Summary (Last 24 hours) at 06/25/2022 0935 Last data filed at 06/25/2022 0600 Gross per 24 hour  Intake 2250.48 ml  Output 750 ml  Net 1500.48 ml    Vitals:  Vitals:   06/25/22 0815 06/25/22 0830 06/25/22 0916 06/25/22 0917  BP:   (!) 186/100   Pulse: 95 89  88  Resp: (!) 22 18    Temp:      TempSrc:      SpO2: 100% 100%    Weight:      Height:         Physical Exam:  General: adult male in bed in NAD at rest  HEENT: NCAT Eyes: EOMI sclera anicteric Neck: supple trachea midline  Heart:  S1S2 no rub Lungs: reduced on auscultation; unlabored at rest; on 10 liters oxygen Abdomen: soft/nt/nd Extremities: 2+ edema; no cyanosis or clubbing  Neuro: alert and oriented x 3 provides hx and follows commands  Psych normal mood and affect GU foley catheter in place   Medications reviewed   Labs:     Latest Ref Rng & Units 06/25/2022    4:27 AM 06/24/2022   11:36 AM 06/24/2022    5:32 AM  BMP  Glucose 70 - 99 mg/dL 102   228   BUN 8 - 23 mg/dL 78   76   Creatinine 0.61 - 1.24 mg/dL 12.34  12.27  12.15   Sodium 135 - 145 mmol/L 128   133   Potassium 3.5 - 5.1 mmol/L 3.2   3.2   Chloride 98 - 111 mmol/L 104   103   CO2 22 - 32 mmol/L 15   15   Calcium 8.9 - 10.3 mg/dL 6.3   6.7      Assessment/Plan:   # 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  Attempted diuresis on admission.  He  has progressed to ESRD - He consents for dialysis and understands that I recommend initiating dialysis today  - Has been NPO  - I have consulted surgery for a dialysis catheter.  Ideally would need tunneled catheter today or alternatively a nontunneled catheter today followed by a tunneled catheter early next week when surgery or IR is able to place  - HD today and tomorrow.  Orders placed and I spoke with HD RN. Then would coordinate next treatment around his access placement (if he needs tunneled catheter on Monday).  I have gone ahead and placed orders for 2/5 HD treatment - I have contacted the SW to set up outpatient HD unit - he previously followed with Dr. Theador Hawthorne - [facility]illiant to remove foley and monitor for retention   # HTN emergency  - on nitro gtt   - continue diuresis as well     # Acute hypoxic respiratory failure   - covid, flu and RSV negative  - s/p bipap - optimize volume status as above   # Anemia - normocytic  - s/p PRBC's on 2/1. Iron deficiency noted - PRBC's per primary team    # Metabolic bone disease - Hypocalcemia - Intact PTH in  process   # Hypokalemia - replete gently potassium 20 meq once    # Hyponatremia - initiating HD. Impaired free water excretion    Disposition - continue inpatient monitoring.  Nephrology follow his labs over the weekend and see if needed.  Please do not hesitate to reach out with questions   Claudia Desanctis, MD 06/25/2022 10:30 AM

## 2022-06-25 NOTE — Progress Notes (Signed)
PROGRESS NOTE   Matthew Robles  YTK:[id number] DOB: [birth year] DOA: 06/24/2022 PCP: Pcp, No   Chief Complaint  Patient presents with   Respiratory Distress   Level of care: Stepdown  Brief Admission History:  67 year old gentleman with longstanding hypertension since age 9, stage IV CKD, type 2 diabetes mellitus with neuropathy, gout, sleep disorder, history of stroke, congestive heart failure, anemia, chronic edema who established nephrology care with Dr. Molli Barrows on 06/16/2021.  He apparently was lost to follow up.   He reported that over the past 2 weeks has had progressive symptoms of increasing edema in the lower extremities and poor urine output.  He reports that he noticed less than 2 ounces of urine in the past couple of days.  He is also had a cough and increasing shortness of breath that has worsened over the past 3 days.  He denies chest pain and palpitations.  He has noticed significant increase in the edema in the legs.  It is affecting his ambulation.  He also noticed increased abdominal distention from edema.  He reports that he has been taking his medications for blood pressure.  He reports that he takes amlodipine and hydralazine.  He does have a history of poor compliance with medications.     Patient presented to the emergency department with acute respiratory distress that woke him up this morning.  His pulse ox was 60%.  EMS had him on CPAP.  The ED placed him on BiPAP with improvement in symptoms.  His chest x-ray showed pulmonary edema and bilateral pleural effusions.  His BNP was markedly elevated.  He appears clinically volume overloaded.  He was given IV Lasix with no significant urine output.  His creatinine was elevated at 12.  His blood pressure was markedly elevated and he was placed on an IV nitroglycerin infusion.  Nephrology was consulted.  He is being admitted for management of hypertensive emergency.   Assessment and Plan:  Hypertensive emergency  - pt  presented with markedly elevated BP and significant renal failure - continue IV nitroglycerin for now - IV lasix 120 mg x 1 dose per nephrology team with metolazone with small amount of urine output  - restarted hydralazine and metoprolol and hopefully can wean off IV nitro infusion  - hopefully we can wean off nitro infusion after volume removal with HD    AKI on CKD stage IV - records indicate he was stage IV CKD 1 year ago - he is having poor urinary output at this time - foley catheter in place  - appreciate nephrology consultation - nephrology planning to start HD 2/2 after temp cath placed by surgery - Daily renal function panel ordered   Non Anion Gap Metabolic Acidosis - secondary to acute renal failure  - likely will need to start HD if no improvement  - nephrology team consulting    Acute HFpEF  - LVEF 50-55% with grade 2 DD from 2019 TTE - IV lasix and metolazone as ordered - if no response, plan is for starting hemodialysis 2/2 per nephrology team - update TTE ordered, EF down to 45-50%     Acute respiratory failure with hypoxia  - secondary to pulmonary edema, Acute HFpEF - pt responded well to bipap therapy - he is now off bipap therapy but will leave for PRN treatment and QHS treatment - volume removal is the treatment, nephrology team planning HD on 2/2 - following closely in stepdown ICU - RSV, Covid, Flu testing negative  Type 2 Diabetes Mellitus with neuropathy  - Carb modified diet ordered - very sensitive SSI coverage and frequent CBG monitoring at least 5x per day CBG (last 3)  Recent Labs    06/24/22 1622 06/24/22 1945 06/25/22 0243  GLUCAP 138* 128* 89      Leukocytosis - suspect this is reactive - repeating CBC with diff in AM    Hypokalemia - ordering diet now, will let him eat - following daily renal function panel - oral replacement given   Bilateral Pleural Effusion  - secondary to volume overload  - treating with diuresis vs  hemodialysis - hemodialysis planned to start 2/2 per nephrology     Anemia in CKD  - Hg down to 6 - transfused 1 unit PRBC on 06/24/22 - follow up anemia panel - CBC in AM   DVT prophylaxis: [place] heparin Code Status: full  Family Communication: discussed with patient at bedside, he verbalized understanding  Disposition: Status is: Inpatient Remains inpatient appropriate because: IV treatments    Consultants:  Nephrology Surgery  Procedures:  Dialysis cath placement planned for 2/2 Hemodialysis planned to start 2/2  Antimicrobials:    Subjective: Pt reports that overall starting to feel a little better. No CP, mild SOB. Cough is nonproductive.   Objective: Vitals:   06/25/22 0945 06/25/22 1000 06/25/22 1015 06/25/22 1030  BP: (!) 195/99 (!) 196/100 (!) 187/106 (!) 170/95  Pulse: 90 92 92 92  Resp: 18 20 16  (!) 21  Temp:      TempSrc:      SpO2: 97% 94% 95% 93%  Weight:      Height:        Intake/Output Summary (Last 24 hours) at 06/25/2022 1036 Last data filed at 06/25/2022 1015 Gross per 24 hour  Intake 2550.04 ml  Output 750 ml  Net 1800.04 ml   Filed Weights   06/24/22 1205 06/25/22 0500  Weight: 90 kg 91.5 kg   Examination:  General exam: Appears calm and comfortable, sitting in chair, no distress seen.   Respiratory system: bibasilar crackles, mild tachypnea.  Cardiovascular system: normal S1 & S2 heard. Mild JVD, soft systolic murmurs, No rubs, gallops or clicks. 2+ pedal edema. Gastrointestinal system: Abdomen is mildly distended, soft and nontender. No organomegaly or masses felt. Normal bowel sounds heard. Central nervous system: Alert and oriented. No focal neurological deficits. Extremities: 2+ pitting edema BLEs, Symmetric 5 x 5 power. Skin: No rashes, lesions or ulcers. Psychiatry: Judgement and insight appear normal. Mood & affect appropriate.   Data Reviewed: I have personally reviewed following labs and imaging studies  CBC: Recent Labs  Lab  06/24/22 0532 06/24/22 1136 06/24/22 2238 06/25/22 0427  WBC 13.4* 11.7*  --  10.9*  NEUTROABS 7.1  --   --  7.1  HGB 8.4* 6.7* 7.4* 7.4*  HCT 27.1* 20.6* 22.3* 22.7*  MCV 97.8 94.5  --  93.0  PLT 298 235  --  323    Basic Metabolic Panel: Recent Labs  Lab 06/24/22 0532 06/24/22 1136 06/25/22 0427  NA 133*  --  128*  K 3.2*  --  3.2*  CL 103  --  104  CO2 15*  --  15*  GLUCOSE 228*  --  102*  BUN 76*  --  78*  CREATININE 12.15* 12.27* 12.34*  CALCIUM 6.7*  --  6.3*  MG 2.5*  --  2.3  PHOS  --   --  7.8*    CBG: Recent Labs  Lab  06/24/22 1145 06/24/22 1622 06/24/22 1945 06/25/22 0243  GLUCAP 129* 138* 128* 89    Recent Results (from the past 240 hour(s))  Resp panel by RT-PCR (RSV, Flu A&B, Covid) Anterior Nasal Swab     Status: None   Collection Time: 06/24/22 10:12 AM   Specimen: Anterior Nasal Swab  Result Value Ref Range Status   SARS Coronavirus 2 by RT PCR NEGATIVE NEGATIVE Final    Comment: (NOTE) SARS-CoV-2 target nucleic acids are NOT DETECTED.  The SARS-CoV-2 RNA is generally detectable in upper respiratory specimens during the acute phase of infection. The lowest concentration of SARS-CoV-2 viral copies this assay can detect is 138 copies/mL. A negative result does not preclude SARS-Cov-2 infection and should not be used as the sole basis for treatment or other patient management decisions. A negative result may occur with  improper specimen collection/handling, submission of specimen other than nasopharyngeal swab, presence of viral mutation(s) within the areas targeted by this assay, and inadequate number of viral copies(<138 copies/mL). A negative result must be combined with clinical observations, patient history, and epidemiological information. The expected result is Negative.  Fact Sheet for Patients:  BloggerCourse.comhttps://www.fda.gov/media/152166/download  Fact Sheet for Healthcare Providers:  SeriousBroker.ithttps://www.fda.gov/media/152162/download  This  test is no t yet approved or cleared by the [place] FDA and  has been authorized for detection and/or diagnosis of SARS-CoV-2 by FDA under an Emergency Use Authorization (EUA). This EUA will remain  in effect (meaning this test can be used) for the duration of the COVID-19 declaration under Section 564(b)(1) of the Act, 21 U.S.C.section 360bbb-3(b)(1), unless the authorization is terminated  or revoked sooner.       Influenza A by PCR NEGATIVE NEGATIVE Final   Influenza B by PCR NEGATIVE NEGATIVE Final    Comment: (NOTE) The Xpert Xpress SARS-CoV-2/FLU/RSV plus assay is intended as an aid in the diagnosis of influenza from Nasopharyngeal swab specimens and should not be used as a sole basis for treatment. Nasal washings and aspirates are unacceptable for Xpert Xpress SARS-CoV-2/FLU/RSV testing.  Fact Sheet for Patients: BloggerCourse.comhttps://www.fda.gov/media/152166/download  Fact Sheet for Healthcare Providers: SeriousBroker.ithttps://www.fda.gov/media/152162/download  This test is not yet approved or cleared by the [place] FDA and has been authorized for detection and/or diagnosis of SARS-CoV-2 by FDA under an Emergency Use Authorization (EUA). This EUA will remain in effect (meaning this test can be used) for the duration of the COVID-19 declaration under Section 564(b)(1) of the Act, 21 U.S.C. section 360bbb-3(b)(1), unless the authorization is terminated or revoked.     Resp Syncytial Virus by PCR NEGATIVE NEGATIVE Final    Comment: (NOTE) Fact Sheet for Patients: BloggerCourse.comhttps://www.fda.gov/media/152166/download  Fact Sheet for Healthcare Providers: SeriousBroker.ithttps://www.fda.gov/media/152162/download  This test is not yet approved or cleared by the [place] FDA and has been authorized for detection and/or diagnosis of SARS-CoV-2 by FDA under an Emergency Use Authorization (EUA). This EUA will remain in effect (meaning this test can be used) for the duration of the COVID-19 declaration under  Section 564(b)(1) of the Act, 21 U.S.C. section 360bbb-3(b)(1), unless the authorization is terminated or revoked.  Performed at [facility], [address] [place], [place] [id number]   MRSA Next Gen by PCR, Nasal     Status: None   Collection Time: 06/24/22 10:12 AM   Specimen: Anterior Nasal Swab  Result Value Ref Range Status   MRSA by PCR Next Gen NOT DETECTED NOT DETECTED Final    Comment: (NOTE) The GeneXpert MRSA Assay (FDA approved for NASAL specimens  only), is one component of a comprehensive MRSA colonization surveillance program. It is not intended to diagnose MRSA infection nor to guide or monitor treatment for MRSA infections. Test performance is not FDA approved in patients less than 46 years old. Performed at [facility], [address], [place], [place] [address]      Radiology Studies: ECHOCARDIOGRAM COMPLETE  Result Date: 06/24/2022    ECHOCARDIOGRAM REPORT   Patient Name:   RYDGE TEXIDOR Date of Exam: 06/24/2022 Medical Rec #:  [id number]       Height:       72.0 in Accession #:    [id number]      Weight:       194.0 lb Date of Birth:  1955/11/07       BSA:          2.103 m Patient Age:    66 years        BP:           154/90 mmHg Patient Gender: M               HR:           86 bpm. [facility] Procedure: 2D Echo, Cardiac Doppler and Color Doppler Indications:    Congestive Heart Failure I50.9  History:        Patient has no prior history of Echocardiogram examinations.                 CHF, Stroke; Risk Factors:Hypertension, Diabetes and Current                 Smoker. Acute respiratory failure with hypoxia, ARF (acute renal                 failure).  Sonographer:    Celesta Gentile RCS Referring Phys: [phone] Sparkle Aube L Jerre Diguglielmo IMPRESSIONS  1. Left ventricular ejection fraction, by estimation, is 45 to 50%. The left ventricle has mildly decreased function. The left ventricle demonstrates global hypokinesis. There is moderate concentric left ventricular  hypertrophy. Left ventricular diastolic parameters are consistent with Grade II diastolic dysfunction (pseudonormalization).  2. Right ventricular systolic function is mildly reduced. The right ventricular size is normal. Tricuspid regurgitation signal is inadequate for assessing PA pressure.  3. Left atrial size was severely dilated.  4. Right atrial size was moderately dilated.  5. Left pleural effusion noted.  6. The mitral valve is degenerative. Mild mitral valve regurgitation.  7. The aortic valve is tricuspid. There is mild calcification of the aortic valve. Aortic valve regurgitation is trivial. Aortic valve sclerosis/calcification is present, without any evidence of aortic stenosis.  8. The inferior vena cava is dilated in size with >50% respiratory variability, suggesting right atrial pressure of 8 mmHg. Comparison(s): Prior images unable to be directly viewed. FINDINGS  Left Ventricle: Left ventricular ejection fraction, by estimation, is 45 to 50%. The left ventricle has mildly decreased function. The left ventricle demonstrates global hypokinesis. The left ventricular internal cavity size was normal in size. There is  moderate concentric left ventricular hypertrophy. Left ventricular diastolic parameters are consistent with Grade II diastolic dysfunction (pseudonormalization). Right Ventricle: The right ventricular size is normal. No increase in right ventricular wall thickness. Right ventricular systolic function is mildly reduced. Tricuspid regurgitation signal is inadequate for assessing PA pressure. Left Atrium: Left atrial size was severely dilated. Right Atrium: Right atrial size was moderately dilated. Pericardium: Left pleural effusion noted. There is no evidence of pericardial effusion. Mitral Valve:  The mitral valve is degenerative in appearance. There is mild calcification of the mitral valve leaflet(s). Mild to moderate mitral annular calcification. Mild mitral valve regurgitation. Tricuspid  Valve: The tricuspid valve is grossly normal. Tricuspid valve regurgitation is trivial. Aortic Valve: The aortic valve is tricuspid. There is mild calcification of the aortic valve. There is mild aortic valve annular calcification. Aortic valve regurgitation is trivial. Aortic valve sclerosis/calcification is present, without any evidence of aortic stenosis. Pulmonic Valve: The pulmonic valve was grossly normal. Pulmonic valve regurgitation is trivial. Aorta: The aortic root is normal in size and structure. Venous: The inferior vena cava is dilated in size with greater than 50% respiratory variability, suggesting right atrial pressure of 8 mmHg. IAS/Shunts: No atrial level shunt detected by color flow Doppler.  LEFT VENTRICLE PLAX 2D LVIDd:         5.40 cm   Diastology LVIDs:         4.10 cm   LV e' medial:    6.42 cm/s LV PW:         1.40 cm   LV E/e' medial:  22.1 LV IVS:        1.40 cm   LV e' lateral:   8.26 cm/s LVOT diam:     2.00 cm   LV E/e' lateral: 17.2 LV SV:         55 LV SV Index:   26 LVOT Area:     3.14 cm  RIGHT VENTRICLE RV S prime:     14.50 cm/s TAPSE (M-mode): 2.4 cm LEFT ATRIUM              Index        RIGHT ATRIUM           Index LA diam:        4.60 cm  2.19 cm/m   RA Area:     28.20 cm LA Vol (A2C):   140.0 ml 66.57 ml/m  RA Volume:   100.00 ml 47.55 ml/m LA Vol (A4C):   105.0 ml 49.92 ml/m LA Biplane Vol: 122.0 ml 58.01 ml/m  AORTIC VALVE LVOT Vmax:   90.00 cm/s LVOT Vmean:  63.600 cm/s LVOT VTI:    0.174 m  AORTA Ao Root diam: 3.50 cm MITRAL VALVE MV Area (PHT): 5.66 cm     SHUNTS MV Decel Time: 134 msec     Systemic VTI:  0.17 m MV E velocity: 142.00 cm/s  Systemic Diam: 2.00 cm MV A velocity: 56.90 cm/s MV E/A ratio:  2.50 Rozann Lesches MD Electronically signed by Rozann Lesches MD Signature Date/Time: 06/24/2022/2:58:46 PM    Final    CT Renal Stone Study  Result Date: 06/24/2022 CLINICAL DATA:  Abdominal pain and flank pain with kidney stone suspected in a 67 year old male.  EXAM: CT ABDOMEN AND PELVIS WITHOUT CONTRAST TECHNIQUE: Multidetector CT imaging of the abdomen and pelvis was performed following the standard protocol without IV contrast. RADIATION DOSE REDUCTION: This exam was performed according to the departmental dose-optimization program which includes automated exposure control, adjustment of the mA and/or kV according to patient size and/or use of iterative reconstruction technique. COMPARISON:  Renal sonogram from 2019. No recent abdominal imaging for comparison. FINDINGS: Lower chest: Moderately large RIGHT and moderate LEFT pleural effusions. Septal thickening at the lung bases. Basilar volume loss/consolidation bilaterally. Heart size is enlarged. Low-attenuation cardiac blood pool. No pericardial effusion. Heart is incompletely imaged. No chest wall abnormality. Hepatobiliary: Smooth hepatic contours. No pericholecystic stranding. No gross  biliary duct distension. No visible lesion on noncontrast imaging. Small amount of sludge in the gallbladder. Pancreas: Pancreas with normal contours, no signs of inflammation or peripancreatic fluid. Spleen: Normal. Adrenals/Urinary Tract: LEFT adrenal nodule measures 25 Hounsfield units and is 12 x 11 mm. Lobular bilateral renal contours a represent fetal lobation or scarring. No nephrolithiasis. No hydronephrosis. Mild perinephric stranding. No substantial perivesical stranding. No ureteral calculi. Stomach/Bowel: Small hiatal hernia. No stranding adjacent to the stomach. No sign of small bowel obstruction. No sign of small bowel inflammation. Normal appendix. No colonic distension or inflammation. Vascular/Lymphatic: Aortic atherosclerosis. No sign of aneurysm. Smooth contour of the IVC. There is no gastrohepatic or hepatoduodenal ligament lymphadenopathy. No retroperitoneal or mesenteric lymphadenopathy. No pelvic sidewall lymphadenopathy. Calcified atherosclerotic changes are moderate is limited assessment of vascular  structures due to lack of intravenous contrast. Reproductive: Unremarkable by CT. Other: Body wall edema. More pronounced periumbilical edema than other areas over the abdomen. Flank edema is symmetric. No substantial intra-abdominal fluid. No pneumoperitoneum. Musculoskeletal: No acute or destructive bone process. Mild spinal degenerative changes. IMPRESSION: 1. Suggestion of heart failure or volume overload in this patient with cardiomegaly, with mild to moderate anasarca associated with bilateral pleural effusions and pulmonary edema. 2. Basilar airspace disease likely related to volume loss. Would also correlate with signs of infection. 3. More pronounced stranding about the umbilicus of uncertain significance, potentially related to underlying edema but given more concentrated stranding in this area would suggest correlation with any signs of infection/cellulitis. 4. No acute intra-abdominal or pelvic findings. No signs of nephrolithiasis or ureteral calculi. Normal appendix. 5. 1.2 cm left adrenal mass, probable benign adenoma. Recommend follow-up adrenal washout CT in 1 year. If stable for = 1 year, no further follow-up imaging. JACR 2017 Aug; 14(8):1038-44, JCAT 2016 Mar-Apr; 40(2):194-200, Urol J 2006 Spring; 3(2):71-4. Also consider the following. Based on current clinical literature, biochemical evaluation to exclude possible functioning adrenal nodule is suggested if not already performed. Please refer to current clinical guidelines for detailed recommendations. NEJM 9798:921 154-51. 6. Low-attenuation cardiac blood pool, can be seen in the setting of anemia. 7. Small hiatal hernia. 8. Aortic atherosclerosis. Aortic Atherosclerosis (ICD10-I70.0). Electronically Signed   By: Donzetta Kohut M.D.   On: 06/24/2022 08:28   DG Chest Portable 1 View  Result Date: 06/24/2022 CLINICAL DATA:  Evaluate for dyspnea.  History of CHF EXAM: PORTABLE CHEST 1 VIEW COMPARISON:  12/25/2020 FINDINGS: Chronic  cardiomegaly. Small pleural effusions and bilateral airspace disease greater on the right. No pneumothorax. IMPRESSION: CHF pattern. Electronically Signed   By: Tiburcio Pea M.D.   On: 06/24/2022 05:48    Scheduled Meds:  Chlorhexidine Gluconate Cloth  6 each Topical Daily   Chlorhexidine Gluconate Cloth  6 each Topical Q0600   Chlorhexidine Gluconate Cloth  6 each Topical Q0600   Chlorhexidine Gluconate Cloth  6 each Topical Q0600   heparin  5,000 Units Subcutaneous Q8H   hydrALAZINE  100 mg Oral TID   insulin aspart  0-6 Units Subcutaneous TID WC   insulin aspart  2 Units Subcutaneous TID WC   metoprolol tartrate  50 mg Oral BID   Continuous Infusions:  calcium gluconate     nitroGLYCERIN 200 mcg/min (06/25/22 1015)     LOS: 1 day   Critical Care Procedure Note Authorized and Performed by: Maryln Manuel MD  Total Critical Care time:  51 mins Due to a high probability of clinically significant, life threatening deterioration, the patient required my highest level of  preparedness to intervene emergently and I personally spent this critical care time directly and personally managing the patient.  This critical care time included obtaining a history; examining the patient, pulse oximetry; ordering and review of studies; arranging urgent treatment with development of a management plan; evaluation of patient's response of treatment; frequent reassessment; and discussions with other providers.  This critical care time was performed to assess and manage the high probability of imminent and life threatening deterioration that could result in multi-organ failure.  It was exclusive of separately billable procedures and treating other patients and teaching time.    Irwin Brakeman, MD How to contact the [facility] Attending or Consulting provider [place] or covering provider during after hours [place], for this patient?  Check the care team in [facility] and look for a) attending/consulting TRH provider listed  and b) the [facility] team listed Log into [email or web address] and use [place]'s universal password to access. If you do not have the password, please contact the hospital operator. Locate the [facility] provider you are looking for under [facility] and page to a number that you can be directly reached. If you still have difficulty reaching the provider, please page the [facility] (Director on Call) for the Hospitalists listed on amion for assistance.  06/25/2022, 10:36 AM

## 2022-06-25 NOTE — TOC Progression Note (Signed)
Transition of Care [facility]) - Progression Note    Patient Details  Name: Matthew Robles MRN: [id number] Date of Birth: July 17, 1955  Transition of Care [facility]) CM/SW Contact  Salome Arnt, [place] Phone Number: 06/25/2022, 12:43 PM  Clinical Narrative:  Notified by nephrologist that pt will be starting dialysis. Pt requested [facility]. CMA will start CLIP.        Barriers to Discharge: Continued Medical Work up  Expected Discharge Plan and Services                                               Social Determinants of Health (SDOH) Interventions SDOH Screenings   Food Insecurity: No Food Insecurity (06/24/2022)  Housing: Low Risk  (06/24/2022)  Transportation Needs: No Transportation Needs (06/24/2022)  Utilities: Not At Risk (06/24/2022)  Tobacco Use: Medium Risk (06/24/2022)    Readmission Risk Interventions     No data to display

## 2022-06-25 NOTE — Consult Note (Signed)
[facility] Consult  Reason for Consult: ESRD Referring Physician:  Dr. Malen GauzeFoster  Chief Complaint   Respiratory Distress     HPI: Matthew Robles is a 67 y.o. male with HTN, DM, hx CVA, CKD worsening that needs dialysis. He has been hypertensive and short of breath. Nephrology is saying he needs to start dialysis now. They tried lasix without success.   Past Medical History:  Diagnosis Date   CHF (congestive heart failure) (HCC)    Diabetes mellitus without complication (HCC)    Hypertension    Stroke (HCC)    2017    Past Surgical History:  Procedure Laterality Date   WISDOM TOOTH EXTRACTION      Family History  Problem Relation Age of Onset   Diabetes Mother    Hypertension Mother    Diabetes Maternal Grandfather     Social History   Tobacco Use   Smoking status: Former    Packs/day: 1.00    Types: Cigarettes    Quit date: 09/15/2017    Years since quitting: 4.7   Smokeless tobacco: Never  Vaping Use   Vaping Use: Never used  Substance Use Topics   Alcohol use: Not Currently    Alcohol/week: 4.0 standard drinks of alcohol    Types: 4 Glasses of wine per week    Comment: month   Drug use: Not Currently    Types: Marijuana    Comment: occassionally    Medications: I have reviewed the patient's current medications. Prior to Admission:  Medications Prior to Admission  Medication Sig Dispense Refill Last Dose   furosemide (LASIX) 20 MG tablet Take 60 mg by mouth 2 (two) times daily.   06/23/2022   potassium chloride SA (KLOR-CON) 20 MEQ tablet Take 1 tablet (20 mEq total) by mouth daily. 30 tablet 0 06/23/2022   amLODipine (NORVASC) 10 MG tablet Take 1 tablet by mouth once daily (Patient not taking: Reported on 06/24/2022) 90 tablet 3 Not Taking   aspirin EC 81 MG EC tablet Take 1 tablet (81 mg total) by mouth daily. (Patient not taking: Reported on 12/25/2020)   Not Taking   hydrALAZINE (APRESOLINE) 100 MG tablet Take 1 tablet (100 mg total) by  mouth 3 (three) times daily. (Patient not taking: Reported on 06/24/2022) 90 tablet 0 Not Taking   insulin glargine (LANTUS) 100 UNIT/ML injection Inject 10 Units into the skin daily. (Patient not taking: Reported on 12/25/2020)   Not Taking   metoprolol succinate (TOPROL-XL) 25 MG 24 hr tablet Take 25 mg by mouth 2 (two) times daily. (Patient not taking: Reported on 06/24/2022)   Not Taking   Scheduled:  [MAR Hold] Chlorhexidine Gluconate Cloth  6 each Topical Daily   [MAR Hold] Chlorhexidine Gluconate Cloth  6 each Topical Q0600   [MAR Hold] Chlorhexidine Gluconate Cloth  6 each Topical Q0600   [MAR Hold] Chlorhexidine Gluconate Cloth  6 each Topical Q0600   [MAR Hold] furosemide  80 mg Intravenous Daily   [MAR Hold] heparin  5,000 Units Subcutaneous Q8H   [MAR Hold] hydrALAZINE  100 mg Oral TID   [MAR Hold] insulin aspart  0-6 Units Subcutaneous TID WC   [MAR Hold] insulin aspart  2 Units Subcutaneous TID WC   ipratropium-albuterol       [MAR Hold] metoprolol tartrate  50 mg Oral BID   Continuous:  sodium chloride 10 mL/hr at 06/25/22 1552    ceFAZolin (ANCEF) IV     [MAR Hold] nitroGLYCERIN 150 mcg/min (06/25/22 1552)  PRN:[MAR Hold] acetaminophen **OR** [MAR Hold] acetaminophen, [MAR Hold] bisacodyl, [MAR Hold] fentaNYL (SUBLIMAZE) injection, [MAR Hold] hydrALAZINE, ipratropium-albuterol, [MAR Hold] labetalol, [MAR Hold] ondansetron **OR** [MAR Hold] ondansetron (ZOFRAN) IV, [MAR Hold] oxyCODONE, [MAR Hold] traZODone  No Known Allergies   ROS:  A comprehensive review of systems was negative except for: Respiratory: positive for SOB  Blood pressure (!) 170/90, pulse 87, temperature 98.4 F (36.9 C), resp. rate 17, height 6' (1.829 m), weight 91.5 kg, SpO2 97 %. Physical Exam Vitals reviewed.  HENT:     Head: Normocephalic.     Nose: Nose normal.     Mouth/Throat:     Mouth: Mucous membranes are moist.  Eyes:     Extraocular Movements: Extraocular movements intact.   Cardiovascular:     Rate and Rhythm: Normal rate.  Pulmonary:     Effort: Pulmonary effort is normal.  Abdominal:     Palpations: Abdomen is soft.  Musculoskeletal:        General: Swelling present.  Neurological:     General: No focal deficit present.     Mental Status: He is alert and oriented to person, place, and time.  Psychiatric:        Mood and Affect: Mood normal.        Behavior: Behavior normal.     Results: Results for orders placed or performed during the hospital encounter of 06/24/22 (from the past 48 hour(s))  CBC with Differential     Status: Abnormal   Collection Time: 06/24/22  5:32 AM  Result Value Ref Range   WBC 13.4 (H) 4.0 - 10.5 K/uL   RBC 2.77 (L) 4.22 - 5.81 MIL/uL   Hemoglobin 8.4 (L) 13.0 - 17.0 g/dL   HCT 78.427.1 (L) 69.639.0 - 29.552.0 %   MCV 97.8 80.0 - 100.0 fL   MCH 30.3 26.0 - 34.0 pg   MCHC 31.0 30.0 - 36.0 g/dL   RDW 28.414.6 13.211.5 - 44.015.5 %   Platelets 298 150 - 400 K/uL   nRBC 0.0 0.0 - 0.2 %   Neutrophils Relative % 53 %   Neutro Abs 7.1 1.7 - 7.7 K/uL   Lymphocytes Relative 13 %   Lymphs Abs 1.7 0.7 - 4.0 K/uL   Monocytes Relative 5 %   Monocytes Absolute 0.7 0.1 - 1.0 K/uL   Eosinophils Relative 29 %   Eosinophils Absolute 3.9 (H) 0.0 - 0.5 K/uL   Basophils Relative 0 %   Basophils Absolute 0.0 0.0 - 0.1 K/uL   WBC Morphology EOSINOPHILIA    RBC Morphology MORPHOLOGY UNREMARKABLE    Smear Review MORPHOLOGY UNREMARKABLE    Abs Immature Granulocytes 0.00 0.00 - 0.07 K/uL    Comment: Performed at [facility], [address] [place], [place] [address]  Comprehensive metabolic panel     Status: Abnormal   Collection Time: 06/24/22  5:32 AM  Result Value Ref Range   Sodium 133 (L) 135 - 145 mmol/L   Potassium 3.2 (L) 3.5 - 5.1 mmol/L   Chloride 103 98 - 111 mmol/L   CO2 15 (L) 22 - 32 mmol/L   Glucose, Bld 228 (H) 70 - 99 mg/dL    Comment: Glucose reference range applies only to samples taken after fasting for at least 8 hours.   BUN 76  (H) 8 - 23 mg/dL   Creatinine, Ser 53.6612.15 (H) 0.61 - 1.24 mg/dL   Calcium 6.7 (L) 8.9 - 10.3 mg/dL   Total Protein 6.4 (L) 6.5 - 8.1 g/dL  Albumin 2.0 (L) 3.5 - 5.0 g/dL   AST 25 15 - 41 U/L   ALT 13 0 - 44 U/L   Alkaline Phosphatase 70 38 - 126 U/L   Total Bilirubin 0.7 0.3 - 1.2 mg/dL   GFR, Estimated 4 (L) >60 mL/min    Comment: (NOTE) Calculated using the CKD-EPI Creatinine Equation (2021)    Anion gap 15 5 - 15    Comment: Performed at [facility], [address] [place], [place] [address]  Troponin I (High Sensitivity)     Status: Abnormal   Collection Time: 06/24/22  5:32 AM  Result Value Ref Range   Troponin I (High Sensitivity) 76 (H) <18 ng/L    Comment: (NOTE) Elevated high sensitivity troponin I (hsTnI) values and significant  changes across serial measurements may suggest ACS but many other  chronic and acute conditions are known to elevate hsTnI results.  Refer to the "Links" section for chest pain algorithms and additional  guidance. Performed at [facility], [address] [place], [place] [address]   Brain natriuretic peptide     Status: Abnormal   Collection Time: 06/24/22  5:32 AM  Result Value Ref Range   B Natriuretic Peptide 2,945.0 (H) 0.0 - 100.0 pg/mL    Comment: Performed at [facility], [address] [place], [place] [address]  Pathologist smear review     Status: None   Collection Time: 06/24/22  5:32 AM  Result Value Ref Range   Path Review Reviewed By Havery MorosBassam N. Smir, M.D.     Comment: 2.2.2024 NORMOCYTIC ANEMIA AND LEUKOCYTOSIS WITH EOSINOPHILIA. Performed at [facility], [address] [place], [place] [address]   Magnesium     Status: Abnormal   Collection Time: 06/24/22  5:32 AM  Result Value Ref Range   Magnesium 2.5 (H) 1.7 - 2.4 mg/dL    Comment: Performed at [facility], [address] [place], [place] [address]  Blood gas, arterial     Status: Abnormal   Collection Time: 06/24/22  5:47 AM  Result Value Ref  Range   pH, Arterial 7.25 (L) 7.35 - 7.45   pCO2 arterial 31 (L) 32 - 48 mmHg   pO2, Arterial 116 (H) 83 - 108 mmHg   Bicarbonate 13.6 (L) 20.0 - 28.0 mmol/L   Acid-base deficit 12.4 (H) 0.0 - 2.0 mmol/L   O2 Saturation 99.3 %   Patient temperature 37.0    Collection site RIGHT RADIAL    Drawn by [id number]    Allens test (pass/fail) PASS PASS    Comment: Performed at [facility], [address] [place], [place] [address]  Resp panel by RT-PCR (RSV, Flu A&B, Covid) Anterior Nasal Swab     Status: None   Collection Time: 06/24/22 10:12 AM   Specimen: Anterior Nasal Swab  Result Value Ref Range   SARS Coronavirus 2 by RT PCR NEGATIVE NEGATIVE    Comment: (NOTE) SARS-CoV-2 target nucleic acids are NOT DETECTED.  The SARS-CoV-2 RNA is generally detectable in upper respiratory specimens during the acute phase of infection. The lowest concentration of SARS-CoV-2 viral copies this assay can detect is 138 copies/mL. A negative result does not preclude SARS-Cov-2 infection and should not be used as the sole basis for treatment or other patient management decisions. A negative result may occur with  improper specimen collection/handling, submission of specimen other than nasopharyngeal swab, presence of viral mutation(s) within the areas targeted by this assay, and inadequate number of viral copies(<138  copies/mL). A negative result must be combined with clinical observations, patient history, and epidemiological information. The expected result is Negative.  Fact Sheet for Patients:  BloggerCourse.com  Fact Sheet for Healthcare Providers:  SeriousBroker.it  This test is no t yet approved or cleared by the [place] FDA and  has been authorized for detection and/or diagnosis of SARS-CoV-2 by FDA under an Emergency Use Authorization (EUA). This EUA will remain  in effect (meaning this test can be used) for the duration of the COVID-19  declaration under Section 564(b)(1) of the Act, 21 U.S.C.section 360bbb-3(b)(1), unless the authorization is terminated  or revoked sooner.       Influenza A by PCR NEGATIVE NEGATIVE   Influenza B by PCR NEGATIVE NEGATIVE    Comment: (NOTE) The Xpert Xpress SARS-CoV-2/FLU/RSV plus assay is intended as an aid in the diagnosis of influenza from Nasopharyngeal swab specimens and should not be used as a sole basis for treatment. Nasal washings and aspirates are unacceptable for Xpert Xpress SARS-CoV-2/FLU/RSV testing.  Fact Sheet for Patients: BloggerCourse.com  Fact Sheet for Healthcare Providers: SeriousBroker.it  This test is not yet approved or cleared by the [place] FDA and has been authorized for detection and/or diagnosis of SARS-CoV-2 by FDA under an Emergency Use Authorization (EUA). This EUA will remain in effect (meaning this test can be used) for the duration of the COVID-19 declaration under Section 564(b)(1) of the Act, 21 U.S.C. section 360bbb-3(b)(1), unless the authorization is terminated or revoked.     Resp Syncytial Virus by PCR NEGATIVE NEGATIVE    Comment: (NOTE) Fact Sheet for Patients: BloggerCourse.com  Fact Sheet for Healthcare Providers: SeriousBroker.it  This test is not yet approved or cleared by the [place] FDA and has been authorized for detection and/or diagnosis of SARS-CoV-2 by FDA under an Emergency Use Authorization (EUA). This EUA will remain in effect (meaning this test can be used) for the duration of the COVID-19 declaration under Section 564(b)(1) of the Act, 21 U.S.C. section 360bbb-3(b)(1), unless the authorization is terminated or revoked.  Performed at [facility], [address] [place], [place] [address]   MRSA Next Gen by PCR, Nasal     Status: None   Collection Time: 06/24/22 10:12 AM   Specimen: Anterior Nasal  Swab  Result Value Ref Range   MRSA by PCR Next Gen NOT DETECTED NOT DETECTED    Comment: (NOTE) The GeneXpert MRSA Assay (FDA approved for NASAL specimens only), is one component of a comprehensive MRSA colonization surveillance program. It is not intended to diagnose MRSA infection nor to guide or monitor treatment for MRSA infections. Test performance is not FDA approved in patients less than 55 years old. Performed at [facility], [address], [place], [place] [address]   Miscellaneous [facility] test (send-out)     Status: None   Collection Time: 06/24/22 11:32 AM  Result Value Ref Range   [facility] test code [id number]    [facility] test name VITAMIN D 25 HYDROXY    Source ([facility]) SERUM     Comment: Performed at [facility], [address], [place], [place] [address]   Misc [facility] result COMMENT     Comment: (NOTE) Test Ordered: [id number] Vitamin D, 25-Hydroxy Vitamin D, 25-Hydroxy          5.3         [L ] ng/mL    BN     Reference Range: 30.0-100.0  Vitamin D deficiency has been defined by the [place] practice guideline as a level of serum 25-OH vitamin D less than 20 ng/mL (1,2). The Endocrine Society went on to further define vitamin D insufficiency as a level between 21 and 29 ng/mL (2). 1. IOM (Institute of Medicine). 2010. Dietary reference   intakes for calcium and D. [place]: The   [employer]. 2. Holick MF, Binkley Daniels, Bischoff-Ferrari HA, et al.   Evaluation, treatment, and prevention of vitamin D   deficiency: an Endocrine Society clinical practice   guideline. JCEM. 2011 Jul; 96(7):1911-30. Performed At: [facility] [place], [place] [id number] Rush Farmer MD YC:[id number]   Vitamin B12     Status: None   Collection Time: 06/24/22 11:36 AM  Result Value Ref Range   Vitamin B-12 837 180 - 914 pg/mL    Comment: (NOTE) This assay is not validated for  testing neonatal or myeloproliferative syndrome specimens for Vitamin B12 levels. Performed at [facility], [address] [place], [place] [address]   Folate     Status: None   Collection Time: 06/24/22 11:36 AM  Result Value Ref Range   Folate 6.9 >5.9 ng/mL    Comment: Performed at [facility], [address], [place], [place] [address]  Iron and TIBC     Status: Abnormal   Collection Time: 06/24/22 11:36 AM  Result Value Ref Range   Iron 33 (L) 45 - 182 ug/dL   TIBC 157 (L) 250 - 450 ug/dL   Saturation Ratios 21 17.9 - 39.5 %   UIBC 124 ug/dL    Comment: Performed at [facility], [address] [place], [place] [address]  Ferritin     Status: Abnormal   Collection Time: 06/24/22 11:36 AM  Result Value Ref Range   Ferritin 457 (H) 24 - 336 ng/mL    Comment: Performed at [facility], [address], [place], [place] [address]  Reticulocytes     Status: Abnormal   Collection Time: 06/24/22 11:36 AM  Result Value Ref Range   Retic Ct Pct 1.9 0.4 - 3.1 %   RBC. 2.14 (L) 4.22 - 5.81 MIL/uL   Retic Count, Absolute 40.4 19.0 - 186.0 K/uL   Immature Retic Fract 15.9 2.3 - 15.9 %    Comment: Performed at [facility], [address], [place], [place] [address]  Hemoglobin A1c     Status: Abnormal   Collection Time: 06/24/22 11:36 AM  Result Value Ref Range   Hgb A1c MFr Bld 5.9 (H) 4.8 - 5.6 %    Comment: (NOTE) Pre diabetes:          5.7%-6.4%  Diabetes:              >6.4%  Glycemic control for   <7.0% adults with diabetes    Mean Plasma Glucose 122.63 mg/dL    Comment: Performed at [facility], [place] [address] [place], [place] [address]  HIV Antibody (routine testing w rflx)     Status: None   Collection Time: 06/24/22 11:36 AM  Result Value Ref Range   HIV Screen 4th Generation wRfx Non Reactive Non Reactive    Comment: Performed at [facility], [place] [address], [place], [place] [address]  CBC     Status: Abnormal   Collection Time: 06/24/22 11:36 AM   Result Value Ref Range   WBC 11.7 (H) 4.0 - 10.5 K/uL  RBC 2.18 (L) 4.22 - 5.81 MIL/uL   Hemoglobin 6.7 (LL) 13.0 - 17.0 g/dL    Comment: REPEATED TO VERIFY THIS CRITICAL RESULT HAS VERIFIED AND BEEN CALLED TO A COE RN BY KIRSTENE FORSYTH ON 02 01 2024 AT 1208, AND HAS BEEN READ BACK.     HCT 20.6 (L) 39.0 - 52.0 %   MCV 94.5 80.0 - 100.0 fL   MCH 30.7 26.0 - 34.0 pg   MCHC 32.5 30.0 - 36.0 g/dL   RDW 44.0 34.7 - 42.5 %   Platelets 235 150 - 400 K/uL   nRBC 0.0 0.0 - 0.2 %    Comment: Performed at [facility], [address], [place], [place] [address]  Creatinine, serum     Status: Abnormal   Collection Time: 06/24/22 11:36 AM  Result Value Ref Range   Creatinine, Ser 12.27 (H) 0.61 - 1.24 mg/dL   GFR, Estimated 4 (L) >60 mL/min    Comment: (NOTE) Calculated using the CKD-EPI Creatinine Equation (2021) Performed at [facility], [address], [place], [place] [address]   TSH     Status: None   Collection Time: 06/24/22 11:36 AM  Result Value Ref Range   TSH 3.169 0.350 - 4.500 uIU/mL    Comment: Performed by a 3rd Generation assay with a functional sensitivity of <=0.01 uIU/mL. Performed at [facility], [address], [place], [place] [address]   Prepare RBC (crossmatch)     Status: None   Collection Time: 06/24/22 11:36 AM  Result Value Ref Range   Order Confirmation      ORDER PROCESSED BY BLOOD BANK Performed at [facility], [address] [place], [place] [address]   ABO/Rh     Status: None   Collection Time: 06/24/22 11:43 AM  Result Value Ref Range   ABO/RH(D)      O POS Performed at [facility], [address] [place], [place] [address]   CBG monitoring, ED     Status: Abnormal   Collection Time: 06/24/22 11:45 AM  Result Value Ref Range   Glucose-Capillary 129 (H) 70 - 99 mg/dL    Comment: Glucose reference range applies only to samples taken after fasting for at least 8 hours.  Type and screen [facility]     Status: None   Collection Time:  06/24/22 12:40 PM  Result Value Ref Range   ABO/RH(D) O POS    Antibody Screen NEG    Sample Expiration 06/27/2022,2359    Unit Number [id number]    Blood Component Type RED CELLS,LR    Unit division 00    Status of Unit ISSUED,FINAL    Transfusion Status OK TO TRANSFUSE    Crossmatch Result      Compatible Performed at [facility], [address], [place], [place] [address]   Glucose, capillary     Status: Abnormal   Collection Time: 06/24/22  4:22 PM  Result Value Ref Range   Glucose-Capillary 138 (H) 70 - 99 mg/dL    Comment: Glucose reference range applies only to samples taken after fasting for at least 8 hours.  Glucose, capillary     Status: Abnormal   Collection Time: 06/24/22  7:45 PM  Result Value Ref Range   Glucose-Capillary 128 (H) 70 - 99 mg/dL    Comment: Glucose reference range applies only to samples taken after fasting for at least 8 hours.  Hemoglobin and hematocrit, blood     Status: Abnormal   Collection Time: 06/24/22  10:38 PM  Result Value Ref Range   Hemoglobin 7.4 (L) 13.0 - 17.0 g/dL   HCT 22.3 (L) 39.0 - 52.0 %    Comment: Performed at [facility], [address] [place], [place] [address]  Glucose, capillary     Status: None   Collection Time: 06/25/22  2:43 AM  Result Value Ref Range   Glucose-Capillary 89 70 - 99 mg/dL    Comment: Glucose reference range applies only to samples taken after fasting for at least 8 hours.  Brain natriuretic peptide     Status: Abnormal   Collection Time: 06/25/22  4:27 AM  Result Value Ref Range   B Natriuretic Peptide 2,536.0 (H) 0.0 - 100.0 pg/mL    Comment: Performed at [facility], [address], [place], [place] [address]  Renal function panel     Status: Abnormal   Collection Time: 06/25/22  4:27 AM  Result Value Ref Range   Sodium 128 (L) 135 - 145 mmol/L   Potassium 3.2 (L) 3.5 - 5.1 mmol/L   Chloride 104 98 - 111 mmol/L   CO2 15 (L) 22 - 32 mmol/L   Glucose, Bld 102 (H) 70 - 99 mg/dL    Comment:  Glucose reference range applies only to samples taken after fasting for at least 8 hours.   BUN 78 (H) 8 - 23 mg/dL   Creatinine, Ser 12.34 (H) 0.61 - 1.24 mg/dL   Calcium 6.3 (LL) 8.9 - 10.3 mg/dL    Comment: CRITICAL RESULT CALLED TO, READ BACK BY AND VERIFIED WITH: GEORGE R @ [phone] ON [id number] BY HENDERSON L    Phosphorus 7.8 (H) 2.5 - 4.6 mg/dL   Albumin 1.8 (L) 3.5 - 5.0 g/dL   GFR, Estimated 4 (L) >60 mL/min    Comment: (NOTE) Calculated using the CKD-EPI Creatinine Equation (2021)    Anion gap 9 5 - 15    Comment: Performed at [facility], [address], [place], [place] [address]  CBC with Differential/Platelet     Status: Abnormal   Collection Time: 06/25/22  4:27 AM  Result Value Ref Range   WBC 10.9 (H) 4.0 - 10.5 K/uL   RBC 2.44 (L) 4.22 - 5.81 MIL/uL   Hemoglobin 7.4 (L) 13.0 - 17.0 g/dL   HCT 22.7 (L) 39.0 - 52.0 %   MCV 93.0 80.0 - 100.0 fL   MCH 30.3 26.0 - 34.0 pg   MCHC 32.6 30.0 - 36.0 g/dL   RDW 15.0 11.5 - 15.5 %   Platelets 198 150 - 400 K/uL   nRBC 0.0 0.0 - 0.2 %   Neutrophils Relative % 65 %   Neutro Abs 7.1 1.7 - 7.7 K/uL   Lymphocytes Relative 7 %   Lymphs Abs 0.7 0.7 - 4.0 K/uL   Monocytes Relative 10 %   Monocytes Absolute 1.1 (H) 0.1 - 1.0 K/uL   Eosinophils Relative 16 %   Eosinophils Absolute 1.8 (H) 0.0 - 0.5 K/uL   Basophils Relative 1 %   Basophils Absolute 0.1 0.0 - 0.1 K/uL   Immature Granulocytes 1 %   Abs Immature Granulocytes 0.05 0.00 - 0.07 K/uL    Comment: Performed at [facility], [address], [place], [place] [address]  Magnesium     Status: None   Collection Time: 06/25/22  4:27 AM  Result Value Ref Range   Magnesium 2.3 1.7 - 2.4 mg/dL    Comment: Performed at [facility], [address] [place], [place] [address]  Glucose, capillary     Status: Abnormal   Collection Time: 06/25/22  9:21 AM  Result Value Ref Range   Glucose-Capillary 125 (H) 70 - 99 mg/dL    Comment: Glucose reference range applies only to samples  taken after fasting for at least 8 hours.  Glucose, capillary     Status: Abnormal   Collection Time: 06/25/22 11:03 AM  Result Value Ref Range   Glucose-Capillary 115 (H) 70 - 99 mg/dL    Comment: Glucose reference range applies only to samples taken after fasting for at least 8 hours.  Glucose, capillary     Status: Abnormal   Collection Time: 06/25/22  2:07 PM  Result Value Ref Range   Glucose-Capillary 127 (H) 70 - 99 mg/dL    Comment: Glucose reference range applies only to samples taken after fasting for at least 8 hours.    ECHOCARDIOGRAM COMPLETE  Result Date: 06/24/2022    ECHOCARDIOGRAM REPORT   Patient Name:   DRYSTAN READER Date of Exam: 06/24/2022 Medical Rec #:  [id number]       Height:       72.0 in Accession #:    [id number]      Weight:       194.0 lb Date of Birth:  16-Aug-1955       BSA:          2.103 m Patient Age:    18 years        BP:           154/90 mmHg Patient Gender: M               HR:           86 bpm. [facility] Procedure: 2D Echo, Cardiac Doppler and Color Doppler Indications:    Congestive Heart Failure I50.9  History:        Patient has no prior history of Echocardiogram examinations.                 CHF, Stroke; Risk Factors:Hypertension, Diabetes and Current                 Smoker. Acute respiratory failure with hypoxia, ARF (acute renal                 failure).  Sonographer:    Celesta Gentile RCS Referring Phys: [phone] [facility] L J[facility]NSON IMPRESSIONS  1. Left ventricular ejection fraction, by estimation, is 45 to 50%. The left ventricle has mildly decreased function. The left ventricle demonstrates global hypokinesis. There is moderate concentric left ventricular hypertrophy. Left ventricular diastolic parameters are consistent with Grade II diastolic dysfunction (pseudonormalization).  2. Right ventricular systolic function is mildly reduced. The right ventricular size is normal. Tricuspid regurgitation signal is inadequate for assessing PA pressure.  3.  Left atrial size was severely dilated.  4. Right atrial size was moderately dilated.  5. Left pleural effusion noted.  6. The mitral valve is degenerative. Mild mitral valve regurgitation.  7. The aortic valve is tricuspid. There is mild calcification of the aortic valve. Aortic valve regurgitation is trivial. Aortic valve sclerosis/calcification is present, without any evidence of aortic stenosis.  8. The inferior vena cava is dilated in size with >50% respiratory variability, suggesting right atrial pressure of 8 mmHg. Comparison(s): Prior images unable to be directly viewed. FINDINGS  Left Ventricle: Left ventricular ejection fraction, by estimation, is 45 to 50%. The left ventricle has mildly decreased function. The left ventricle demonstrates  global hypokinesis. The left ventricular internal cavity size was normal in size. There is  moderate concentric left ventricular hypertrophy. Left ventricular diastolic parameters are consistent with Grade II diastolic dysfunction (pseudonormalization). Right Ventricle: The right ventricular size is normal. No increase in right ventricular wall thickness. Right ventricular systolic function is mildly reduced. Tricuspid regurgitation signal is inadequate for assessing PA pressure. Left Atrium: Left atrial size was severely dilated. Right Atrium: Right atrial size was moderately dilated. Pericardium: Left pleural effusion noted. There is no evidence of pericardial effusion. Mitral Valve: The mitral valve is degenerative in appearance. There is mild calcification of the mitral valve leaflet(s). Mild to moderate mitral annular calcification. Mild mitral valve regurgitation. Tricuspid Valve: The tricuspid valve is grossly normal. Tricuspid valve regurgitation is trivial. Aortic Valve: The aortic valve is tricuspid. There is mild calcification of the aortic valve. There is mild aortic valve annular calcification. Aortic valve regurgitation is trivial. Aortic valve  sclerosis/calcification is present, without any evidence of aortic stenosis. Pulmonic Valve: The pulmonic valve was grossly normal. Pulmonic valve regurgitation is trivial. Aorta: The aortic root is normal in size and structure. Venous: The inferior vena cava is dilated in size with greater than 50% respiratory variability, suggesting right atrial pressure of 8 mmHg. IAS/Shunts: No atrial level shunt detected by color flow Doppler.  LEFT VENTRICLE PLAX 2D LVIDd:         5.40 cm   Diastology LVIDs:         4.10 cm   LV e' medial:    6.42 cm/s LV PW:         1.40 cm   LV E/e' medial:  22.1 LV IVS:        1.40 cm   LV e' lateral:   8.26 cm/s LVOT diam:     2.00 cm   LV E/e' lateral: 17.2 LV SV:         55 LV SV Index:   26 LVOT Area:     3.14 cm  RIGHT VENTRICLE RV S prime:     14.50 cm/s TAPSE (M-mode): 2.4 cm LEFT ATRIUM              Index        RIGHT ATRIUM           Index LA diam:        4.60 cm  2.19 cm/m   RA Area:     28.20 cm LA Vol (A2C):   140.0 ml 66.57 ml/m  RA Volume:   100.00 ml 47.55 ml/m LA Vol (A4C):   105.0 ml 49.92 ml/m LA Biplane Vol: 122.0 ml 58.01 ml/m  AORTIC VALVE LVOT Vmax:   90.00 cm/s LVOT Vmean:  63.600 cm/s LVOT VTI:    0.174 m  AORTA Ao Root diam: 3.50 cm MITRAL VALVE MV Area (PHT): 5.66 cm     SHUNTS MV Decel Time: 134 msec     Systemic VTI:  0.17 m MV E velocity: 142.00 cm/s  Systemic Diam: 2.00 cm MV A velocity: 56.90 cm/s MV E/A ratio:  2.50 Rozann Lesches MD Electronically signed by Rozann Lesches MD Signature Date/Time: 06/24/2022/2:58:46 PM    Final    CT Renal Stone Study  Result Date: 06/24/2022 CLINICAL DATA:  Abdominal pain and flank pain with kidney stone suspected in a 67 year old male. EXAM: CT ABDOMEN AND PELVIS WITHOUT CONTRAST TECHNIQUE: Multidetector CT imaging of the abdomen and pelvis was performed following the standard protocol without IV contrast. RADIATION DOSE REDUCTION:  This exam was performed according to the departmental dose-optimization program  which includes automated exposure control, adjustment of the mA and/or kV according to patient size and/or use of iterative reconstruction technique. COMPARISON:  Renal sonogram from 2019. No recent abdominal imaging for comparison. FINDINGS: Lower chest: Moderately large RIGHT and moderate LEFT pleural effusions. Septal thickening at the lung bases. Basilar volume loss/consolidation bilaterally. Heart size is enlarged. Low-attenuation cardiac blood pool. No pericardial effusion. Heart is incompletely imaged. No chest wall abnormality. Hepatobiliary: Smooth hepatic contours. No pericholecystic stranding. No gross biliary duct distension. No visible lesion on noncontrast imaging. Small amount of sludge in the gallbladder. Pancreas: Pancreas with normal contours, no signs of inflammation or peripancreatic fluid. Spleen: Normal. Adrenals/Urinary Tract: LEFT adrenal nodule measures 25 Hounsfield units and is 12 x 11 mm. Lobular bilateral renal contours a represent fetal lobation or scarring. No nephrolithiasis. No hydronephrosis. Mild perinephric stranding. No substantial perivesical stranding. No ureteral calculi. Stomach/Bowel: Small hiatal hernia. No stranding adjacent to the stomach. No sign of small bowel obstruction. No sign of small bowel inflammation. Normal appendix. No colonic distension or inflammation. Vascular/Lymphatic: Aortic atherosclerosis. No sign of aneurysm. Smooth contour of the IVC. There is no gastrohepatic or hepatoduodenal ligament lymphadenopathy. No retroperitoneal or mesenteric lymphadenopathy. No pelvic sidewall lymphadenopathy. Calcified atherosclerotic changes are moderate is limited assessment of vascular structures due to lack of intravenous contrast. Reproductive: Unremarkable by CT. Other: Body wall edema. More pronounced periumbilical edema than other areas over the abdomen. Flank edema is symmetric. No substantial intra-abdominal fluid. No pneumoperitoneum. Musculoskeletal: No acute  or destructive bone process. Mild spinal degenerative changes. IMPRESSION: 1. Suggestion of heart failure or volume overload in this patient with cardiomegaly, with mild to moderate anasarca associated with bilateral pleural effusions and pulmonary edema. 2. Basilar airspace disease likely related to volume loss. Would also correlate with signs of infection. 3. More pronounced stranding about the umbilicus of uncertain significance, potentially related to underlying edema but given more concentrated stranding in this area would suggest correlation with any signs of infection/cellulitis. 4. No acute intra-abdominal or pelvic findings. No signs of nephrolithiasis or ureteral calculi. Normal appendix. 5. 1.2 cm left adrenal mass, probable benign adenoma. Recommend follow-up adrenal washout CT in 1 year. If stable for = 1 year, no further follow-up imaging. JACR 2017 Aug; 14(8):1038-44, JCAT 2016 Mar-Apr; 40(2):194-200, Urol J 2006 Spring; 3(2):71-4. Also consider the following. Based on current clinical literature, biochemical evaluation to exclude possible functioning adrenal nodule is suggested if not already performed. Please refer to current clinical guidelines for detailed recommendations. NEJM [id number] [id number]. 6. Low-attenuation cardiac blood pool, can be seen in the setting of anemia. 7. Small hiatal hernia. 8. Aortic atherosclerosis. Aortic Atherosclerosis (ICD10-I70.0). Electronically Signed   By: Donzetta KohutGeoffrey  Wile M.D.   On: 06/24/2022 08:28   DG Chest Portable 1 View  Result Date: 06/24/2022 CLINICAL DATA:  Evaluate for dyspnea.  History of CHF EXAM: PORTABLE CHEST 1 VIEW COMPARISON:  12/25/2020 FINDINGS: Chronic cardiomegaly. Small pleural effusions and bilateral airspace disease greater on the right. No pneumothorax. IMPRESSION: CHF pattern. Electronically Signed   By: Tiburcio PeaJonathan  Watts M.D.   On: 06/24/2022 05:48     Assessment & Plan:  Matthew BarrowSteve Robles is a 67 y.o. male with worsening renal failure  needing dialysis. Nephrology would like a tunneled catheter. He is HTN and SOB. Will try to place a tunneled in the OR and if unable will do a temporary. Discussed use of US and fluorsoscopy. Discussed risk of bleeding,  infection pneumothorax, malfunction of the catheters.    All questions were answered to the satisfaction of the patient.     Lucretia Roers 06/25/2022, 4:11 PM

## 2022-06-25 NOTE — Progress Notes (Addendum)
  INITIAL HEMODIALYSIS TREATMENT NOTE (HD#1):  Indications / risks / benefits of hemodialysis were discussed at length with pt prior to tunneled catheter insertion for first ever HD session today.    2 hour treatment completed.  New catheter tolerated prescribed flow with stable pressures.  Goal met: 558ml removed without interruption in UF.  Persistent HTN noted, NTG infusion was increased by primary nurse.  Additionally, Apresoline 10mg  was given mid-treatment.  All blood was returned.  "I feel 10 pounds lighter." Eyes closed and sonorous respirations observed upon my departure from the room.  Will see him tomorrow for further therapy.  Post-HD:   06/25/22 2200  Vitals  Temp 98.2 F (36.8 C)  Temp Source Oral  BP (!) 173/91  MAP (mmHg) 119  BP Location Left Arm  BP Method Automatic  Patient Position (if appropriate) Lying  Pulse Rate 91  Pulse Rate Source Monitor  ECG Heart Rate 93  Resp 14  Oxygen Therapy  SpO2 96 %  O2 Device HFNC  Post Treatment  Dialyzer Clearance Lightly streaked  Duration of HD Treatment -hour(s) 2 hour(s)  Hemodialysis Intake (mL) 0 mL  Liters Processed 24.1  Fluid Removed (mL) 500 mL  Tolerated HD Treatment Yes  Post-Hemodialysis Comments Goal met  Hemodialysis Catheter Right Internal jugular Double lumen Permanent (Tunneled)  Placement Date/Time: 06/25/22 1759   Placed prior to admission: No  Serial / Lot #: [id number]  Expiration Date: 06/24/26  Time Out: Correct patient;Correct site;Correct procedure  Maximum sterile barrier precautions: Hand hygiene;Cap;Mask;Sterile go...  Site Condition No complications  Blue Lumen Status Heparin locked;Dead end cap in place  Red Lumen Status Heparin locked;Dead end cap in place  Purple Lumen Status N/A  Catheter fill solution Heparin 1000 units/ml  Catheter fill volume (Arterial) 1.9 cc  Catheter fill volume (Venous) 1.9  Dressing Type Transparent;Tube stabilization device  Dressing Status Antimicrobial  disc in place;Clean, Dry, Intact  Interventions New dressing  Drainage Description None  Dressing Change Due 06/26/22  Post treatment catheter status Capped and Clamped   Report given to Morton Peters, RN.   Rockwell Alexandria, RN AP [facility]

## 2022-06-25 NOTE — Progress Notes (Signed)
[facility]  Tried to Updated patient's mother about dialysis catheter procedure and left her a VM after no answer at home or mobile.   CXR pending.   Back to ICU and dialysis RN notified.   [name], MD [facility] [address] [place], [place] [id number] [phone] (office)

## 2022-06-26 DIAGNOSIS — E876 Hypokalemia: Secondary | ICD-10-CM

## 2022-06-26 LAB — RENAL FUNCTION PANEL
Albumin: 1.8 g/dL — ABNORMAL LOW (ref 3.5–5.0)
Anion gap: 13 (ref 5–15)
BUN: 60 mg/dL — ABNORMAL HIGH (ref 8–23)
CO2: 18 mmol/L — ABNORMAL LOW (ref 22–32)
Calcium: 6.7 mg/dL — ABNORMAL LOW (ref 8.9–10.3)
Chloride: 102 mmol/L (ref 98–111)
Creatinine, Ser: 10.26 mg/dL — ABNORMAL HIGH (ref 0.61–1.24)
GFR, Estimated: 5 mL/min — ABNORMAL LOW (ref >60)
Glucose, Bld: 108 mg/dL — ABNORMAL HIGH (ref 70–99)
Phosphorus: 6.9 mg/dL — ABNORMAL HIGH (ref 2.5–4.6)
Potassium: 3.3 mmol/L — ABNORMAL LOW (ref 3.5–5.1)
Sodium: 133 mmol/L — ABNORMAL LOW (ref 135–145)

## 2022-06-26 LAB — CBC WITH DIFFERENTIAL/PLATELET
Abs Immature Granulocytes: 0.04 10*3/uL (ref 0.00–0.07)
Basophils Absolute: 0.1 10*3/uL (ref 0.0–0.1)
Basophils Relative: 1 %
Eosinophils Absolute: 1.5 10*3/uL — ABNORMAL HIGH (ref 0.0–0.5)
Eosinophils Relative: 17 %
HCT: 21.3 % — ABNORMAL LOW (ref 39.0–52.0)
Hemoglobin: 6.9 g/dL — CL (ref 13.0–17.0)
Immature Granulocytes: 1 %
Lymphocytes Relative: 6 %
Lymphs Abs: 0.5 10*3/uL — ABNORMAL LOW (ref 0.7–4.0)
MCH: 29.9 pg (ref 26.0–34.0)
MCHC: 32.4 g/dL (ref 30.0–36.0)
MCV: 92.2 fL (ref 80.0–100.0)
Monocytes Absolute: 0.8 10*3/uL (ref 0.1–1.0)
Monocytes Relative: 10 %
Neutro Abs: 5.8 10*3/uL (ref 1.7–7.7)
Neutrophils Relative %: 65 %
Platelets: 163 10*3/uL (ref 150–400)
RBC: 2.31 MIL/uL — ABNORMAL LOW (ref 4.22–5.81)
RDW: 15.1 % (ref 11.5–15.5)
WBC: 8.8 10*3/uL (ref 4.0–10.5)
nRBC: 0 % (ref 0.0–0.2)

## 2022-06-26 LAB — GLUCOSE, CAPILLARY
Glucose-Capillary: 115 mg/dL — ABNORMAL HIGH (ref 70–99)
Glucose-Capillary: 126 mg/dL — ABNORMAL HIGH (ref 70–99)
Glucose-Capillary: 156 mg/dL — ABNORMAL HIGH (ref 70–99)
Glucose-Capillary: 106 mg/dL — ABNORMAL HIGH (ref 70–99)
Glucose-Capillary: 123 mg/dL — ABNORMAL HIGH (ref 70–99)

## 2022-06-26 LAB — HEPATITIS B SURFACE ANTIBODY, QUANTITATIVE: Hep B S AB Quant (Post): <3.1 m[IU]/mL — ABNORMAL LOW (ref >9.9)

## 2022-06-26 LAB — PREPARE RBC (CROSSMATCH)

## 2022-06-26 LAB — MAGNESIUM: Magnesium: 2.2 mg/dL (ref 1.7–2.4)

## 2022-06-26 LAB — PARATHYROID HORMONE, INTACT (NO CA): PTH: 418 pg/mL — ABNORMAL HIGH (ref 15–65)

## 2022-06-26 MED ORDER — METOPROLOL TARTRATE 50 MG PO TABS
100.0000 mg | ORAL_TABLET | Freq: Two times a day (BID) | ORAL | Status: DC
  Administered 2022-06-26 – 2022-06-30 (×8): 100 mg via ORAL
  Filled 2022-06-26 (×8): qty 2

## 2022-06-26 MED ORDER — SODIUM CHLORIDE 0.9% IV SOLUTION: Freq: Once | INTRAVENOUS | Status: DC

## 2022-06-26 MED ORDER — HEPARIN SODIUM (PORCINE) 1000 UNIT/ML IJ SOLN
INTRAMUSCULAR | Status: AC
  Administered 2022-06-26: 3800 [IU]
  Filled 2022-06-26: qty 6

## 2022-06-26 NOTE — Progress Notes (Signed)
Patient OOB to bedside chair with assist x1, no discomfort voiced this shift. No urine collected or reported since foley cath removed this am, bladder scan performed 315ml recorded. Encouraged patient to stand periodically and ambulate short distances to encourage urination. Nitroglycerin still running at max dosage as SBP's are 170's consistently. Physician aware of anuria, bladder scan performed showed 356ml, no new orders at this time.

## 2022-06-26 NOTE — Progress Notes (Addendum)
PROGRESS NOTE   Matthew Robles  [id number] DOB: 11-Feb-1956 DOA: 06/24/2022 PCP: Pcp, No   Chief Complaint  Patient presents with   Respiratory Distress   Level of care: Stepdown  Brief Admission History:  67 year old gentleman with longstanding hypertension since age 48, stage IV CKD, type 2 diabetes mellitus with neuropathy, gout, sleep disorder, history of stroke, congestive heart failure, anemia, chronic edema who established nephrology care with Dr. Trinda Pascal on 06/16/2021.  He apparently was lost to follow up.   He reported that over the past 2 weeks has had progressive symptoms of increasing edema in the lower extremities and poor urine output.  He reports that he noticed less than 2 ounces of urine in the past couple of days.  He is also had a cough and increasing shortness of breath that has worsened over the past 3 days.  He denies chest pain and palpitations.  He has noticed significant increase in the edema in the legs.  It is affecting his ambulation.  He also noticed increased abdominal distention from edema.  He reports that he has been taking his medications for blood pressure.  He reports that he takes amlodipine and hydralazine.  He does have a history of poor compliance with medications.     Patient presented to the emergency department with acute respiratory distress that woke him up this morning.  His pulse ox was 60%.  EMS had him on CPAP.  The ED placed him on BiPAP with improvement in symptoms.  His chest x-ray showed pulmonary edema and bilateral pleural effusions.  His BNP was markedly elevated.  He appears clinically volume overloaded.  He was given IV Lasix with no significant urine output.  His creatinine was elevated at 12.  His blood pressure was markedly elevated and he was placed on an IV nitroglycerin infusion.  Nephrology was consulted.  He is being admitted for management of hypertensive emergency.   Assessment and Plan:  Hypertensive emergency  - pt  presented with markedly elevated BP and significant renal failure - remains on max dose IV nitroglycerin - IV lasix 120 mg x 1 dose per nephrology team with metolazone with small amount of urine output  - restarted hydralazine and metoprolol and hopefully can wean off IV nitro infusion  - if cannot wean off nitro infusion after 2nd HD today, DC nitro infusion and start nicardipine infusion    AKI on CKD stage IV--->now ESRD  - records indicate he was stage IV CKD 1 year ago - he is having poor urinary output at this time - foley catheter in place  - appreciate nephrology consultation - nephrology planning to start HD 2/2 after temp cath placed by surgery - Daily renal function panel ordered   Non Anion Gap Metabolic Acidosis - secondary to acute renal failure  - likely will need to start HD if no improvement  - nephrology team consulting    Acute HFpEF  - LVEF 50-55% with grade 2 DD from 2019 TTE - IV lasix and metolazone as ordered - if no response, plan is for starting hemodialysis 2/2 per nephrology team - update TTE ordered, EF down to 45-50%     Acute respiratory failure with hypoxia  - secondary to pulmonary edema, Acute HFpEF - pt responded well to bipap therapy - he is now off bipap therapy but will leave for PRN treatment and QHS treatment - volume removal is the treatment, nephrology team planning HD on 2/2 - following closely in stepdown ICU -  RSV, Covid, Flu testing negative    Type 2 Diabetes Mellitus with neuropathy  - Carb modified diet ordered - very sensitive SSI coverage and frequent CBG monitoring at least 5x per day CBG (last 3)  Recent Labs    06/26/22 0347 06/26/22 0735 06/26/22 1106  GLUCAP 115* 126* 156*     Leukocytosis - resolved - suspect this is reactive - repeating CBC in AM    Hypokalemia - ordering diet now, will let him eat - following daily renal function panel - improved; HD today and recheck in AM   Bilateral Pleural Effusion   - secondary to volume overload  - treating with diuresis vs hemodialysis - hemodialysis started 2/2 per nephrology     Anemia in CKD  - Hg down to 6 - transfused 1 unit PRBC on 06/24/22 - transfuse 1 unit PRBC with HD on 06/26/22 - follow up anemia panel - CBC in AM   DVT prophylaxis: Paden heparin Code Status: full  Family Communication: discussed with patient at bedside, he verbalized understanding  Disposition: Status is: Inpatient Remains inpatient appropriate because: IV treatments    Consultants:  Nephrology Surgery  Procedures:  Dialysis cath placement planned for 2/2 Hemodialysis planned to start 2/2  Antimicrobials:    Subjective: Pt tolerated 1st HD treatment, still on max dose nitro infusion, no CP or SOB  Objective: Vitals:   06/26/22 0600 06/26/22 0615 06/26/22 0800 06/26/22 1116  BP: (!) 188/87 (!) 191/94  (!) 187/88  Pulse: 91 95  96  Resp: 16 17  17   Temp:   97.9 F (36.6 C) 98 F (36.7 C)  TempSrc:   Axillary Oral  SpO2: 98% 99%  96%  Weight:      Height:        Intake/Output Summary (Last 24 hours) at 06/26/2022 1238 Last data filed at 06/26/2022 0630 Gross per 24 hour  Intake 1160.75 ml  Output 962 ml  Net 198.75 ml   Filed Weights   06/25/22 0500 06/25/22 1915 06/26/22 0538  Weight: 91.5 kg 91.7 kg 91.5 kg   Examination:  General exam: Appears calm and comfortable, sitting in chair, no distress seen.   Respiratory system: bibasilar crackles, mild tachypnea.  Cardiovascular system: normal S1 & S2 heard. Mild JVD, soft systolic murmurs, No rubs, gallops or clicks. 2+ pedal edema. Gastrointestinal system: Abdomen is mildly distended, soft and nontender. No organomegaly or masses felt. Normal bowel sounds heard. Central nervous system: Alert and oriented. No focal neurological deficits. Extremities: 2+ pitting edema BLEs, Symmetric 5 x 5 power. Skin: No rashes, lesions or ulcers. Psychiatry: Judgement and insight appear normal. Mood & affect  appropriate.   Data Reviewed: I have personally reviewed following labs and imaging studies  CBC: Recent Labs  Lab 06/24/22 0532 06/24/22 1136 06/24/22 2238 06/25/22 0427 06/26/22 0411  WBC 13.4* 11.7*  --  10.9* 8.8  NEUTROABS 7.1  --   --  7.1 5.8  HGB 8.4* 6.7* 7.4* 7.4* 6.9*  HCT 27.1* 20.6* 22.3* 22.7* 21.3*  MCV 97.8 94.5  --  93.0 92.2  PLT 298 235  --  198 885    Basic Metabolic Panel: Recent Labs  Lab 06/24/22 0532 06/24/22 1136 06/25/22 0427 06/26/22 0411  NA 133*  --  128* 133*  K 3.2*  --  3.2* 3.3*  CL 103  --  104 102  CO2 15*  --  15* 18*  GLUCOSE 228*  --  102* 108*  BUN 76*  --  78* 60*  CREATININE 12.15* 12.27* 12.34* 10.26*  CALCIUM 6.7*  --  6.3* 6.7*  MG 2.5*  --  2.3 2.2  PHOS  --   --  7.8* 6.9*    CBG: Recent Labs  Lab 06/25/22 1659 06/25/22 1944 06/26/22 0347 06/26/22 0735 06/26/22 1106  GLUCAP 121* 142* 115* 126* 156*    Recent Results (from the past 240 hour(s))  Resp panel by RT-PCR (RSV, Flu A&B, Covid) Anterior Nasal Swab     Status: None   Collection Time: 06/24/22 10:12 AM   Specimen: Anterior Nasal Swab  Result Value Ref Range Status   SARS Coronavirus 2 by RT PCR NEGATIVE NEGATIVE Final    Comment: (NOTE) SARS-CoV-2 target nucleic acids are NOT DETECTED.  The SARS-CoV-2 RNA is generally detectable in upper respiratory specimens during the acute phase of infection. The lowest concentration of SARS-CoV-2 viral copies this assay can detect is 138 copies/mL. A negative result does not preclude SARS-Cov-2 infection and should not be used as the sole basis for treatment or other patient management decisions. A negative result may occur with  improper specimen collection/handling, submission of specimen other than nasopharyngeal swab, presence of viral mutation(s) within the areas targeted by this assay, and inadequate number of viral copies(<138 copies/mL). A negative result must be combined with clinical observations,  patient history, and epidemiological information. The expected result is Negative.  Fact Sheet for Patients:  EntrepreneurPulse.com.au  Fact Sheet for Healthcare Providers:  IncredibleEmployment.be  This test is no t yet approved or cleared by the [place] FDA and  has been authorized for detection and/or diagnosis of SARS-CoV-2 by FDA under an Emergency Use Authorization (EUA). This EUA will remain  in effect (meaning this test can be used) for the duration of the COVID-19 declaration under Section 564(b)(1) of the Act, 21 U.S.C.section 360bbb-3(b)(1), unless the authorization is terminated  or revoked sooner.       Influenza A by PCR NEGATIVE NEGATIVE Final   Influenza B by PCR NEGATIVE NEGATIVE Final    Comment: (NOTE) The Xpert Xpress SARS-CoV-2/FLU/RSV plus assay is intended as an aid in the diagnosis of influenza from Nasopharyngeal swab specimens and should not be used as a sole basis for treatment. Nasal washings and aspirates are unacceptable for Xpert Xpress SARS-CoV-2/FLU/RSV testing.  Fact Sheet for Patients: EntrepreneurPulse.com.au  Fact Sheet for Healthcare Providers: IncredibleEmployment.be  This test is not yet approved or cleared by the [place] FDA and has been authorized for detection and/or diagnosis of SARS-CoV-2 by FDA under an Emergency Use Authorization (EUA). This EUA will remain in effect (meaning this test can be used) for the duration of the COVID-19 declaration under Section 564(b)(1) of the Act, 21 U.S.C. section 360bbb-3(b)(1), unless the authorization is terminated or revoked.     Resp Syncytial Virus by PCR NEGATIVE NEGATIVE Final    Comment: (NOTE) Fact Sheet for Patients: EntrepreneurPulse.com.au  Fact Sheet for Healthcare Providers: IncredibleEmployment.be  This test is not yet approved or cleared by the [place] FDA and has been authorized for detection and/or diagnosis of SARS-CoV-2 by FDA under an Emergency Use Authorization (EUA). This EUA will remain in effect (meaning this test can be used) for the duration of the COVID-19 declaration under Section 564(b)(1) of the Act, 21 U.S.C. section 360bbb-3(b)(1), unless the authorization is terminated or revoked.  Performed at [facility], [address] [place], [place] [address]   MRSA Next Gen by PCR, Nasal     Status: None  Collection Time: 06/24/22 10:12 AM   Specimen: Anterior Nasal Swab  Result Value Ref Range Status   MRSA by PCR Next Gen NOT DETECTED NOT DETECTED Final    Comment: (NOTE) The GeneXpert MRSA Assay (FDA approved for NASAL specimens only), is one component of a comprehensive MRSA colonization surveillance program. It is not intended to diagnose MRSA infection nor to guide or monitor treatment for MRSA infections. Test performance is not FDA approved in patients less than 67 years old. Performed at [facility], [address], [place], [place] [address]      Radiology Studies: [facility] Chest [facility] 1 View  Result Date: 06/25/2022 CLINICAL DATA:  Postoperative vascular dialysis catheter. EXAM: PORTABLE CHEST 1 VIEW COMPARISON:  06/24/2022 FINDINGS: Interval placement of a right central venous catheter with tip over the low SVC region. No pneumothorax. Mediastinal contours appear intact. Heart size and pulmonary vascularity are mildly prominent but improved since prior study. Small bilateral pleural effusions. IMPRESSION: 1. Right central venous catheter appears in appropriate radiographic position. No pneumothorax. 2. Cardiac enlargement with mild vascular congestion, improved since prior study. Small bilateral pleural effusions. Electronically Signed   By: Lucienne Capers M.D.   On: 06/25/2022 18:53   DG C-Arm 1-60 Min-No Report  Result Date: 06/25/2022 Fluoroscopy was utilized by the requesting physician.  No radiographic  interpretation.   ECHOCARDIOGRAM COMPLETE  Result Date: 06/24/2022    ECHOCARDIOGRAM REPORT   Patient Name:   MARCELLO TUZZOLINO Date of Exam: 06/24/2022 Medical Rec #:  [id number]       Height:       72.0 in Accession #:    [id number]      Weight:       194.0 lb Date of Birth:  1955/07/17       BSA:          2.103 m Patient Age:    36 years        BP:           154/90 mmHg Patient Gender: M               HR:           86 bpm. Exam Location:  [facility] Procedure: 2D Echo, Cardiac Doppler and Color Doppler Indications:    Congestive Heart Failure I50.9  History:        Patient has no prior history of Echocardiogram examinations.                 CHF, Stroke; Risk Factors:Hypertension, Diabetes and Current                 Smoker. Acute respiratory failure with hypoxia, ARF (acute renal                 failure).  Sonographer:    Alvino Chapel RCS Referring Phys: [place]  1. Left ventricular ejection fraction, by estimation, is 45 to 50%. The left ventricle has mildly decreased function. The left ventricle demonstrates global hypokinesis. There is moderate concentric left ventricular hypertrophy. Left ventricular diastolic parameters are consistent with Grade II diastolic dysfunction (pseudonormalization).  2. Right ventricular systolic function is mildly reduced. The right ventricular size is normal. Tricuspid regurgitation signal is inadequate for assessing PA pressure.  3. Left atrial size was severely dilated.  4. Right atrial size was moderately dilated.  5. Left pleural effusion noted.  6. The mitral valve is degenerative. Mild mitral valve regurgitation.  7. The aortic valve is  tricuspid. There is mild calcification of the aortic valve. Aortic valve regurgitation is trivial. Aortic valve sclerosis/calcification is present, without any evidence of aortic stenosis.  8. The inferior vena cava is dilated in size with >50% respiratory variability, suggesting right atrial pressure of 8 mmHg.  Comparison(s): Prior images unable to be directly viewed. FINDINGS  Left Ventricle: Left ventricular ejection fraction, by estimation, is 45 to 50%. The left ventricle has mildly decreased function. The left ventricle demonstrates global hypokinesis. The left ventricular internal cavity size was normal in size. There is  moderate concentric left ventricular hypertrophy. Left ventricular diastolic parameters are consistent with Grade II diastolic dysfunction (pseudonormalization). Right Ventricle: The right ventricular size is normal. No increase in right ventricular wall thickness. Right ventricular systolic function is mildly reduced. Tricuspid regurgitation signal is inadequate for assessing PA pressure. Left Atrium: Left atrial size was severely dilated. Right Atrium: Right atrial size was moderately dilated. Pericardium: Left pleural effusion noted. There is no evidence of pericardial effusion. Mitral Valve: The mitral valve is degenerative in appearance. There is mild calcification of the mitral valve leaflet(s). Mild to moderate mitral annular calcification. Mild mitral valve regurgitation. Tricuspid Valve: The tricuspid valve is grossly normal. Tricuspid valve regurgitation is trivial. Aortic Valve: The aortic valve is tricuspid. There is mild calcification of the aortic valve. There is mild aortic valve annular calcification. Aortic valve regurgitation is trivial. Aortic valve sclerosis/calcification is present, without any evidence of aortic stenosis. Pulmonic Valve: The pulmonic valve was grossly normal. Pulmonic valve regurgitation is trivial. Aorta: The aortic root is normal in size and structure. Venous: The inferior vena cava is dilated in size with greater than 50% respiratory variability, suggesting right atrial pressure of 8 mmHg. IAS/Shunts: No atrial level shunt detected by color flow Doppler.  LEFT VENTRICLE PLAX 2D LVIDd:         5.40 cm   Diastology LVIDs:         4.10 cm   LV e' medial:     6.42 cm/s LV PW:         1.40 cm   LV E/e' medial:  22.1 LV IVS:        1.40 cm   LV e' lateral:   8.26 cm/s LVOT diam:     2.00 cm   LV E/e' lateral: 17.2 LV SV:         55 LV SV Index:   26 LVOT Area:     3.14 cm  RIGHT VENTRICLE RV S prime:     14.50 cm/s TAPSE (M-mode): 2.4 cm LEFT ATRIUM              Index        RIGHT ATRIUM           Index LA diam:        4.60 cm  2.19 cm/m   RA Area:     28.20 cm LA Vol (A2C):   140.0 ml 66.57 ml/m  RA Volume:   100.00 ml 47.55 ml/m LA Vol (A4C):   105.0 ml 49.92 ml/m LA Biplane Vol: 122.0 ml 58.01 ml/m  AORTIC VALVE LVOT Vmax:   90.00 cm/s LVOT Vmean:  63.600 cm/s LVOT VTI:    0.174 m  AORTA Ao Root diam: 3.50 cm MITRAL VALVE MV Area (PHT): 5.66 cm     SHUNTS MV Decel Time: 134 msec     Systemic VTI:  0.17 m MV E velocity: 142.00 cm/s  Systemic Diam: 2.00 cm MV A  velocity: 56.90 cm/s MV E/A ratio:  2.50 Nona Dell MD Electronically signed by Nona Dell MD Signature Date/Time: 06/24/2022/2:58:46 PM    Final     Scheduled Meds:  sodium chloride   Intravenous Once   Chlorhexidine Gluconate Cloth  6 each Topical Daily   Chlorhexidine Gluconate Cloth  6 each Topical Q0600   Chlorhexidine Gluconate Cloth  6 each Topical Q0600   Chlorhexidine Gluconate Cloth  6 each Topical Q0600   furosemide  80 mg Intravenous Daily   heparin  5,000 Units Subcutaneous Q8H   hydrALAZINE  100 mg Oral TID   insulin aspart  0-6 Units Subcutaneous TID WC   insulin aspart  2 Units Subcutaneous TID WC   metoprolol tartrate  50 mg Oral BID   Continuous Infusions:  nitroGLYCERIN 200 mcg/min (06/26/22 1025)     LOS: 2 days   Critical Care Procedure Note Authorized and Performed by: Maryln Manuel MD  Total Critical Care time: 46 mins Due to a high probability of clinically significant, life threatening deterioration, the patient required my highest level of preparedness to intervene emergently and I personally spent this critical care time directly and personally  managing the patient.  This critical care time included obtaining a history; examining the patient, pulse oximetry; ordering and review of studies; arranging urgent treatment with development of a management plan; evaluation of patient's response of treatment; frequent reassessment; and discussions with other providers.  This critical care time was performed to assess and manage the high probability of imminent and life threatening deterioration that could result in multi-organ failure.  It was exclusive of separately billable procedures and treating other patients and teaching time.    Standley Dakins, MD How to contact the [facility] Attending or Consulting provider 7A - 7P or covering provider during after hours 7P -7A, for this patient?  Check the care team in [facility] and look for a) attending/consulting [facility] provider listed and b) the [facility] team listed Log into [email or web address] and use [place]'s universal password to access. If you do not have the password, please contact the hospital operator. Locate the [facility] provider you are looking for under [facility] and page to a number that you can be directly reached. If you still have difficulty reaching the provider, please page the [facility] (Director on Call) for the Hospitalists listed on amion for assistance.  06/26/2022, 12:38 PM

## 2022-06-26 NOTE — Progress Notes (Signed)
  HEMODIALYSIS TREATMENT NOTE (HD#2):   Uneventful 2.5 hour heparin-free treatment completed.  One unit pRBCs transfused.  Goal met: Net UF 1.5 liters.  HTN persisted with SBPs 170s-190s and MAP 120-129.  NTG infusing at maximum dose.  All blood was returned.  Catheter dressing was changed; exit site is unremarkable.  Report given to Matthew Peters, RN  POST-HD:   06/26/22 2130  Vital Signs  Temp 98.2 F (36.8 C)  Temp Source Oral  Pulse Rate 90  Pulse Rate Source Monitor  Resp 19  BP (!) 201/100  BP Location Right Arm  BP Method Automatic  Patient Position (if appropriate) Lying  Oxygen Therapy  SpO2 96 %  O2 Device HFNC  O2 Flow Rate (L/min) 6 L/min  Dialysis Weight  Weight 88 kg  Type of Weight Post-Dialysis  Post Treatment  Dialyzer Clearance Lightly streaked  Duration of HD Treatment -hour(s) 2.5 hour(s)  Hemodialysis Intake (mL) 315 mL  Liters Processed 37.5  Fluid Removed (mL) 1900 mL  Tolerated HD Treatment Yes  Post-Hemodialysis Comments Goal met

## 2022-06-27 DIAGNOSIS — Z91148 Patient's other noncompliance with medication regimen for other reason: Secondary | ICD-10-CM

## 2022-06-27 DIAGNOSIS — J90 Pleural effusion, not elsewhere classified: Secondary | ICD-10-CM

## 2022-06-27 LAB — CBC
HCT: 24.6 % — ABNORMAL LOW (ref 39.0–52.0)
Hemoglobin: 8 g/dL — ABNORMAL LOW (ref 13.0–17.0)
MCH: 30 pg (ref 26.0–34.0)
MCHC: 32.5 g/dL (ref 30.0–36.0)
MCV: 92.1 fL (ref 80.0–100.0)
Platelets: 178 10*3/uL (ref 150–400)
RBC: 2.67 MIL/uL — ABNORMAL LOW (ref 4.22–5.81)
RDW: 15.5 % (ref 11.5–15.5)
WBC: 9.1 10*3/uL (ref 4.0–10.5)
nRBC: 0 % (ref 0.0–0.2)

## 2022-06-27 LAB — RENAL FUNCTION PANEL
Albumin: 1.7 g/dL — ABNORMAL LOW (ref 3.5–5.0)
Anion gap: 10 (ref 5–15)
BUN: 41 mg/dL — ABNORMAL HIGH (ref 8–23)
CO2: 23 mmol/L (ref 22–32)
Calcium: 6.5 mg/dL — ABNORMAL LOW (ref 8.9–10.3)
Chloride: 100 mmol/L (ref 98–111)
Creatinine, Ser: 8 mg/dL — ABNORMAL HIGH (ref 0.61–1.24)
GFR, Estimated: 7 mL/min — ABNORMAL LOW (ref >60)
Glucose, Bld: 103 mg/dL — ABNORMAL HIGH (ref 70–99)
Phosphorus: 5 mg/dL — ABNORMAL HIGH (ref 2.5–4.6)
Potassium: 3.2 mmol/L — ABNORMAL LOW (ref 3.5–5.1)
Sodium: 133 mmol/L — ABNORMAL LOW (ref 135–145)

## 2022-06-27 LAB — GLUCOSE, CAPILLARY
Glucose-Capillary: 110 mg/dL — ABNORMAL HIGH (ref 70–99)
Glucose-Capillary: 121 mg/dL — ABNORMAL HIGH (ref 70–99)
Glucose-Capillary: 100 mg/dL — ABNORMAL HIGH (ref 70–99)
Glucose-Capillary: 93 mg/dL (ref 70–99)

## 2022-06-27 MED ORDER — POTASSIUM CHLORIDE CRYS ER 20 MEQ PO TBCR
40.0000 meq | EXTENDED_RELEASE_TABLET | Freq: Once | ORAL | Status: AC
  Administered 2022-06-27: 40 meq via ORAL
  Filled 2022-06-27: qty 2

## 2022-06-27 NOTE — Plan of Care (Signed)
  Problem: Education: Goal: Knowledge of General Education information will improve Description: Including pain rating scale, medication(s)/side effects and non-pharmacologic comfort measures Outcome: Progressing   Problem: Health Behavior/Discharge Planning: Goal: Ability to manage health-related needs will improve Outcome: Progressing   Problem: Clinical Measurements: Goal: Ability to maintain clinical measurements within normal limits will improve Outcome: Progressing Goal: Will remain free from infection Outcome: Progressing Goal: Diagnostic test results will improve Outcome: Progressing Goal: Respiratory complications will improve Outcome: Progressing Goal: Cardiovascular complication will be avoided Outcome: Progressing   Problem: Activity: Goal: Risk for activity intolerance will decrease Outcome: Progressing   Problem: Nutrition: Goal: Adequate nutrition will be maintained Outcome: Progressing   Problem: Coping: Goal: Level of anxiety will decrease Outcome: Progressing   Problem: Elimination: Goal: Will not experience complications related to bowel motility Outcome: Progressing Goal: Will not experience complications related to urinary retention Outcome: Progressing   Problem: Pain Managment: Goal: General experience of comfort will improve Outcome: Progressing   Problem: Safety: Goal: Ability to remain free from injury will improve Outcome: Progressing   Problem: Skin Integrity: Goal: Risk for impaired skin integrity will decrease Outcome: Progressing   Problem: Education: Goal: Ability to demonstrate management of disease process will improve Outcome: Progressing Goal: Ability to verbalize understanding of medication therapies will improve Outcome: Progressing Goal: Individualized Educational Video(s) Outcome: Progressing   Problem: Activity: Goal: Capacity to carry out activities will improve Outcome: Progressing   Problem: Cardiac: Goal:  Ability to achieve and maintain adequate cardiopulmonary perfusion will improve Outcome: Progressing   Problem: Education: Goal: Ability to describe self-care measures that may prevent or decrease complications (Diabetes Survival Skills Education) will improve Outcome: Progressing Goal: Individualized Educational Video(s) Outcome: Progressing   Problem: Coping: Goal: Ability to adjust to condition or change in health will improve Outcome: Progressing   Problem: Fluid Volume: Goal: Ability to maintain a balanced intake and output will improve Outcome: Progressing   Problem: Health Behavior/Discharge Planning: Goal: Ability to identify and utilize available resources and services will improve Outcome: Progressing Goal: Ability to manage health-related needs will improve Outcome: Progressing   Problem: Metabolic: Goal: Ability to maintain appropriate glucose levels will improve Outcome: Progressing   Problem: Nutritional: Goal: Maintenance of adequate nutrition will improve Outcome: Progressing Goal: Progress toward achieving an optimal weight will improve Outcome: Progressing   Problem: Skin Integrity: Goal: Risk for impaired skin integrity will decrease Outcome: Progressing   Problem: Tissue Perfusion: Goal: Adequacy of tissue perfusion will improve Outcome: Progressing   Problem: Education: Goal: Ability to demonstrate management of disease process will improve Outcome: Progressing Goal: Ability to verbalize understanding of medication therapies will improve Outcome: Progressing Goal: Individualized Educational Video(s) Outcome: Progressing   Problem: Activity: Goal: Capacity to carry out activities will improve Outcome: Progressing   Problem: Cardiac: Goal: Ability to achieve and maintain adequate cardiopulmonary perfusion will improve Outcome: Progressing   

## 2022-06-27 NOTE — Progress Notes (Signed)
PROGRESS NOTE   Matthew Robles  WUJ:[id number] DOB: May 24, 1956 DOA: 06/24/2022 PCP: Pcp, No   Chief Complaint  Patient presents with   Respiratory Distress   Level of care: Stepdown  Brief Admission History:  67 year old gentleman with longstanding hypertension since age 31, stage IV CKD, type 2 diabetes mellitus with neuropathy, gout, sleep disorder, history of stroke, congestive heart failure, anemia, chronic edema who established nephrology care with Dr. Trinda Pascal on 06/16/2021.  He apparently was lost to follow up.   He reported that over the past 2 weeks has had progressive symptoms of increasing edema in the lower extremities and poor urine output.  He reports that he noticed less than 2 ounces of urine in the past couple of days.  He is also had a cough and increasing shortness of breath that has worsened over the past 3 days.  He denies chest pain and palpitations.  He has noticed significant increase in the edema in the legs.  It is affecting his ambulation.  He also noticed increased abdominal distention from edema.  He reports that he has been taking his medications for blood pressure.  He reports that he takes amlodipine and hydralazine.  He does have a history of poor compliance with medications.     Patient presented to the emergency department with acute respiratory distress that woke him up this morning.  His pulse ox was 60%.  EMS had him on CPAP.  The ED placed him on BiPAP with improvement in symptoms.  His chest x-ray showed pulmonary edema and bilateral pleural effusions.  His BNP was markedly elevated.  He appears clinically volume overloaded.  He was given IV Lasix with no significant urine output.  His creatinine was elevated at 12.  His blood pressure was markedly elevated and he was placed on an IV nitroglycerin infusion.  Nephrology was consulted.  He is being admitted for management of hypertensive emergency.   Assessment and Plan:  Hypertensive emergency  - pt  presented with markedly elevated BP and significant renal failure - remains on max dose IV nitroglycerin - IV lasix 120 mg x 1 dose per nephrology team with metolazone with small amount of urine output  - restarted hydralazine and metoprolol and hopefully can wean off IV nitro infusion  -increased metoprolol to 100 mg BID  - continue to wean off nitro infusion    AKI on CKD stage IV--->now ESRD  - records indicate he was stage IV CKD 1 year ago - he is having poor urinary output at this time - foley catheter in place  - appreciate nephrology consultation - nephrology planning to start HD 2/2 after temp cath placed by surgery - Daily renal function panel ordered   Non Anion Gap Metabolic Acidosis - secondary to acute renal failure  - likely will need to start HD if no improvement  - nephrology team consulting    Acute HFpEF  - LVEF 50-55% with grade 2 DD from 2019 TTE - IV lasix and metolazone as ordered - if no response, plan is for starting hemodialysis 2/2 per nephrology team - update TTE ordered, EF down to 45-50%     Acute respiratory failure with hypoxia  - secondary to pulmonary edema, Acute HFpEF - pt responded well to bipap therapy - he is now off bipap therapy but will leave for PRN treatment and QHS treatment - volume removal is the treatment, nephrology team planning HD on 2/2 - following closely in stepdown ICU - RSV, Covid, Flu  testing negative    Type 2 Diabetes Mellitus with neuropathy  - Carb modified diet ordered - very sensitive SSI coverage and frequent CBG monitoring at least 5x per day CBG (last 3)  Recent Labs    06/27/22 0746 06/27/22 1154 06/27/22 1641  GLUCAP 110* 121* 100*     Leukocytosis - resolved - suspect this is reactive - repeating CBC in AM    Hypokalemia - ordering diet now, will let him eat - following daily renal function panel - improved   Bilateral Pleural Effusion  - secondary to volume overload  - treating with diuresis  vs hemodialysis - hemodialysis started 2/2 per nephrology - remains fluid overloaded but much improved     Anemia in CKD  - Hg down to 6 - transfused 1 unit PRBC on 06/24/22 - transfuse 1 unit PRBC with HD on 06/26/22 - follow up anemia panel - Hg improved to 8 after 2nd unit given on 2/3   DVT prophylaxis: [place] heparin Code Status: full  Family Communication: discussed with patient at bedside, he verbalized understanding  Disposition: Status is: Inpatient Remains inpatient appropriate because: IV treatments    Consultants:  Nephrology Surgery  Procedures:  Dialysis cath placement 2/2 Hemodialysis 2/2, 2/3 Antimicrobials:    Subjective: Pt reports feeling better after dialysis #2 no headaches, no vision changes, no chest pain. SOB is improving but not resolved.   Objective: Vitals:   06/27/22 0630 06/27/22 0645 06/27/22 0700 06/27/22 1100  BP: (!) 190/92 (!) 170/79 (!) 162/69 (!) 161/70  Pulse: 88 83 78 85  Resp: 15 13 12 13   Temp:    98.9 F (37.2 C)  TempSrc:    Oral  SpO2: 99% 99% 98% 98%  Weight:      Height:        Intake/Output Summary (Last 24 hours) at 06/27/2022 1703 Last data filed at 06/27/2022 1116 Gross per 24 hour  Intake 849.87 ml  Output 2150 ml  Net -1300.13 ml   Filed Weights   06/26/22 1815 06/26/22 2130 06/27/22 0500  Weight: 89.6 kg 88 kg 88.6 kg   Examination:  General exam: Appears calm and comfortable, sitting in chair, no distress seen.   Respiratory system: bibasilar crackles, mild tachypnea.  Cardiovascular system: normal S1 & S2 heard. Mild JVD, soft systolic murmurs, No rubs, gallops or clicks. 1+ pedal edema. Gastrointestinal system: Abdomen is mildly distended, soft and nontender. No organomegaly or masses felt. Normal bowel sounds heard. Central nervous system: Alert and oriented. No focal neurological deficits. Extremities: 2+ pitting edema BLEs, Symmetric 5 x 5 power. Skin: No rashes, lesions or ulcers. Psychiatry: Judgement and  insight appear normal. Mood & affect appropriate.   Data Reviewed: I have personally reviewed following labs and imaging studies  CBC: Recent Labs  Lab 06/24/22 0532 06/24/22 1136 06/24/22 2238 06/25/22 0427 06/26/22 0411 06/27/22 0443  WBC 13.4* 11.7*  --  10.9* 8.8 9.1  NEUTROABS 7.1  --   --  7.1 5.8  --   HGB 8.4* 6.7* 7.4* 7.4* 6.9* 8.0*  HCT 27.1* 20.6* 22.3* 22.7* 21.3* 24.6*  MCV 97.8 94.5  --  93.0 92.2 92.1  PLT 298 235  --  198 163 416    Basic Metabolic Panel: Recent Labs  Lab 06/24/22 0532 06/24/22 1136 06/25/22 0427 06/26/22 0411 06/27/22 0443  NA 133*  --  128* 133* 133*  K 3.2*  --  3.2* 3.3* 3.2*  CL 103  --  104 102 100  CO2 15*  --  15* 18* 23  GLUCOSE 228*  --  102* 108* 103*  BUN 76*  --  78* 60* 41*  CREATININE 12.15* 12.27* 12.34* 10.26* 8.00*  CALCIUM 6.7*  --  6.3* 6.7* 6.5*  MG 2.5*  --  2.3 2.2  --   PHOS  --   --  7.8* 6.9* 5.0*    CBG: Recent Labs  Lab 06/26/22 1658 06/26/22 2139 06/27/22 0746 06/27/22 1154 06/27/22 1641  GLUCAP 106* 123* 110* 121* 100*    Recent Results (from the past 240 hour(s))  Resp panel by RT-PCR (RSV, Flu A&B, Covid) Anterior Nasal Swab     Status: None   Collection Time: 06/24/22 10:12 AM   Specimen: Anterior Nasal Swab  Result Value Ref Range Status   SARS Coronavirus 2 by RT PCR NEGATIVE NEGATIVE Final    Comment: (NOTE) SARS-CoV-2 target nucleic acids are NOT DETECTED.  The SARS-CoV-2 RNA is generally detectable in upper respiratory specimens during the acute phase of infection. The lowest concentration of SARS-CoV-2 viral copies this assay can detect is 138 copies/mL. A negative result does not preclude SARS-Cov-2 infection and should not be used as the sole basis for treatment or other patient management decisions. A negative result may occur with  improper specimen collection/handling, submission of specimen other than nasopharyngeal swab, presence of viral mutation(s) within the areas  targeted by this assay, and inadequate number of viral copies(<138 copies/mL). A negative result must be combined with clinical observations, patient history, and epidemiological information. The expected result is Negative.  Fact Sheet for Patients:  BloggerCourse.com  Fact Sheet for Healthcare Providers:  SeriousBroker.it  This test is no t yet approved or cleared by the [place] FDA and  has been authorized for detection and/or diagnosis of SARS-CoV-2 by FDA under an Emergency Use Authorization (EUA). This EUA will remain  in effect (meaning this test can be used) for the duration of the COVID-19 declaration under Section 564(b)(1) of the Act, 21 U.S.C.section 360bbb-3(b)(1), unless the authorization is terminated  or revoked sooner.       Influenza A by PCR NEGATIVE NEGATIVE Final   Influenza B by PCR NEGATIVE NEGATIVE Final    Comment: (NOTE) The Xpert Xpress SARS-CoV-2/FLU/RSV plus assay is intended as an aid in the diagnosis of influenza from Nasopharyngeal swab specimens and should not be used as a sole basis for treatment. Nasal washings and aspirates are unacceptable for Xpert Xpress SARS-CoV-2/FLU/RSV testing.  Fact Sheet for Patients: BloggerCourse.com  Fact Sheet for Healthcare Providers: SeriousBroker.it  This test is not yet approved or cleared by the [place] FDA and has been authorized for detection and/or diagnosis of SARS-CoV-2 by FDA under an Emergency Use Authorization (EUA). This EUA will remain in effect (meaning this test can be used) for the duration of the COVID-19 declaration under Section 564(b)(1) of the Act, 21 U.S.C. section 360bbb-3(b)(1), unless the authorization is terminated or revoked.     Resp Syncytial Virus by PCR NEGATIVE NEGATIVE Final    Comment: (NOTE) Fact Sheet for  Patients: BloggerCourse.com  Fact Sheet for Healthcare Providers: SeriousBroker.it  This test is not yet approved or cleared by the [place] FDA and has been authorized for detection and/or diagnosis of SARS-CoV-2 by FDA under an Emergency Use Authorization (EUA). This EUA will remain in effect (meaning this test can be used) for the duration of the COVID-19 declaration under Section 564(b)(1) of the Act, 21 U.S.C. section 360bbb-3(b)(1), unless the authorization  is terminated or revoked.  Performed at [facility], [address], [place], [place] [address]   MRSA Next Gen by PCR, Nasal     Status: None   Collection Time: 06/24/22 10:12 AM   Specimen: Anterior Nasal Swab  Result Value Ref Range Status   MRSA by PCR Next Gen NOT DETECTED NOT DETECTED Final    Comment: (NOTE) The GeneXpert MRSA Assay (FDA approved for NASAL specimens only), is one component of a comprehensive MRSA colonization surveillance program. It is not intended to diagnose MRSA infection nor to guide or monitor treatment for MRSA infections. Test performance is not FDA approved in patients less than 64 years old. Performed at [facility], [address], [place], [place] [address]      Radiology Studies: [facility] Chest [facility] 1 View  Result Date: 06/25/2022 CLINICAL DATA:  Postoperative vascular dialysis catheter. EXAM: PORTABLE CHEST 1 VIEW COMPARISON:  06/24/2022 FINDINGS: Interval placement of a right central venous catheter with tip over the low SVC region. No pneumothorax. Mediastinal contours appear intact. Heart size and pulmonary vascularity are mildly prominent but improved since prior study. Small bilateral pleural effusions. IMPRESSION: 1. Right central venous catheter appears in appropriate radiographic position. No pneumothorax. 2. Cardiac enlargement with mild vascular congestion, improved since prior study. Small bilateral pleural effusions.  Electronically Signed   By: Lucienne Capers M.D.   On: 06/25/2022 18:53   DG C-Arm 1-60 Min-No Report  Result Date: 06/25/2022 Fluoroscopy was utilized by the requesting physician.  No radiographic interpretation.    Scheduled Meds:  sodium chloride   Intravenous Once   Chlorhexidine Gluconate Cloth  6 each Topical Daily   Chlorhexidine Gluconate Cloth  6 each Topical Q0600   Chlorhexidine Gluconate Cloth  6 each Topical Q0600   Chlorhexidine Gluconate Cloth  6 each Topical Q0600   furosemide  80 mg Intravenous Daily   heparin  5,000 Units Subcutaneous Q8H   hydrALAZINE  100 mg Oral TID   insulin aspart  0-6 Units Subcutaneous TID WC   insulin aspart  2 Units Subcutaneous TID WC   metoprolol tartrate  100 mg Oral BID   Continuous Infusions:  nitroGLYCERIN 60 mcg/min (06/27/22 1643)     LOS: 3 days   Time Spent: 52 mins   Jaye Polidori, MD How to contact the [facility] Attending or Consulting provider [place] or covering provider during after hours [place], for this patient?  Check the care team in [facility] and look for a) attending/consulting TR[facility] provider listed and b) the [facility] team listed Log into [email or web address] and use [place]'s universal password to access. If you do not have the password, please contact the hospital operator. Locate the [facility] provider you are looking for under [facility] and page to a number that you can be directly reached. If you still have difficulty reaching the provider, please page the [facility] (Director on Call) for the Hospitalists listed on amion for assistance.  06/27/2022, 5:03 PM

## 2022-06-28 ENCOUNTER — Inpatient Hospital Stay (HOSPITAL_COMMUNITY): Payer: Self-pay

## 2022-06-28 LAB — GLUCOSE, CAPILLARY
Glucose-Capillary: 94 mg/dL (ref 70–99)
Glucose-Capillary: 98 mg/dL (ref 70–99)
Glucose-Capillary: 160 mg/dL — ABNORMAL HIGH (ref 70–99)
Glucose-Capillary: 139 mg/dL — ABNORMAL HIGH (ref 70–99)

## 2022-06-28 LAB — RENAL FUNCTION PANEL
Albumin: 1.7 g/dL — ABNORMAL LOW (ref 3.5–5.0)
Anion gap: 11 (ref 5–15)
BUN: 51 mg/dL — ABNORMAL HIGH (ref 8–23)
CO2: 21 mmol/L — ABNORMAL LOW (ref 22–32)
Calcium: 6.3 mg/dL — CL (ref 8.9–10.3)
Chloride: 99 mmol/L (ref 98–111)
Creatinine, Ser: 9.26 mg/dL — ABNORMAL HIGH (ref 0.61–1.24)
GFR, Estimated: 6 mL/min — ABNORMAL LOW (ref >60)
Glucose, Bld: 91 mg/dL (ref 70–99)
Phosphorus: 6.2 mg/dL — ABNORMAL HIGH (ref 2.5–4.6)
Potassium: 3.6 mmol/L (ref 3.5–5.1)
Sodium: 131 mmol/L — ABNORMAL LOW (ref 135–145)

## 2022-06-28 LAB — TYPE AND SCREEN
ABO/RH(D): O POS
Antibody Screen: NEGATIVE
Unit division: 0
Unit division: 0

## 2022-06-28 LAB — BPAM RBC
Blood Product Expiration Date: 202402242359
Blood Product Expiration Date: 202403062359
ISSUE DATE / TIME: 202402011625
ISSUE DATE / TIME: 202402031916
Unit Type and Rh: 5100
Unit Type and Rh: 5100

## 2022-06-28 LAB — CBC
HCT: 24.2 % — ABNORMAL LOW (ref 39.0–52.0)
Hemoglobin: 7.8 g/dL — ABNORMAL LOW (ref 13.0–17.0)
MCH: 30.1 pg (ref 26.0–34.0)
MCHC: 32.2 g/dL (ref 30.0–36.0)
MCV: 93.4 fL (ref 80.0–100.0)
Platelets: 153 10*3/uL (ref 150–400)
RBC: 2.59 MIL/uL — ABNORMAL LOW (ref 4.22–5.81)
RDW: 15.3 % (ref 11.5–15.5)
WBC: 10.2 10*3/uL (ref 4.0–10.5)
nRBC: 0 % (ref 0.0–0.2)

## 2022-06-28 MED ORDER — LOSARTAN POTASSIUM 50 MG PO TABS
100.0000 mg | ORAL_TABLET | Freq: Every day | ORAL | Status: DC
  Administered 2022-06-28 – 2022-06-30 (×3): 100 mg via ORAL
  Filled 2022-06-28 (×3): qty 2

## 2022-06-28 MED ORDER — DARBEPOETIN ALFA 300 MCG/0.6ML IJ SOSY
200.0000 ug | PREFILLED_SYRINGE | INTRAMUSCULAR | Status: DC
  Administered 2022-06-28 – 2022-07-05 (×2): 200 ug via SUBCUTANEOUS
  Filled 2022-06-28 (×2): qty 0.6

## 2022-06-28 MED ORDER — NICARDIPINE HCL IN NACL 20-0.86 MG/200ML-% IV SOLN
3.0000 mg/h | INTRAVENOUS | Status: DC
  Administered 2022-06-28: 5 mg/h via INTRAVENOUS
  Administered 2022-06-28 (×2): 10 mg/h via INTRAVENOUS
  Administered 2022-06-28: 3 mg/h via INTRAVENOUS
  Administered 2022-06-29: 4 mg/h via INTRAVENOUS
  Administered 2022-06-29: 5 mg/h via INTRAVENOUS
  Administered 2022-06-30 (×3): 10 mg/h via INTRAVENOUS
  Administered 2022-06-30: 5 mg/h via INTRAVENOUS
  Administered 2022-06-30: 4 mg/h via INTRAVENOUS
  Filled 2022-06-28 (×11): qty 200

## 2022-06-28 MED ORDER — CALCITRIOL 0.25 MCG PO CAPS
0.5000 ug | ORAL_CAPSULE | Freq: Every day | ORAL | Status: DC
  Administered 2022-06-28 – 2022-07-05 (×8): 0.5 ug via ORAL
  Filled 2022-06-28 (×8): qty 2

## 2022-06-28 MED ORDER — SODIUM CHLORIDE 0.9 % IV SOLN
250.0000 mg | Freq: Every day | INTRAVENOUS | Status: AC
  Administered 2022-06-28 – 2022-07-01 (×4): 250 mg via INTRAVENOUS
  Filled 2022-06-28 (×3): qty 20
  Filled 2022-06-28: qty 250

## 2022-06-28 NOTE — Progress Notes (Signed)
   HEMODIALYSIS TREATMENT NOTE (HD#3):  3 hour heparin-free treatment completed bedside in ICU. Goal challenge met: 3 liters removed without interruption in UF.  All blood was returned.  HTN persists. Primary nurse messaged Dr. Wynetta Emery.    POST-HD:   06/28/22 1315  Vital Signs  Temp 98.2 F (36.8 C)  Temp Source Oral  Pulse Rate 78  Pulse Rate Source Monitor  Resp 15  BP (!) 202/113  BP Location Left Arm  BP Method Automatic  Patient Position (if appropriate) Sitting  Oxygen Therapy  SpO2 97 %  O2 Device HFNC  O2 Flow Rate (L/min) 6 L/min  Patient Activity (if Appropriate) In bed  Dialysis Weight  Weight 86 kg  Type of Weight Post-Dialysis  Post Treatment  Dialyzer Clearance Lightly streaked  Duration of HD Treatment -hour(s) 3 hour(s)  Hemodialysis Intake (mL) 0 mL  Liters Processed 72  Fluid Removed (mL) 3000 mL  Tolerated HD Treatment Yes  Post-Hemodialysis Comments HTN persists despite goal challenge.  Primary nurse is messaging Dr. Wynetta Emery   Report given to Cyndra Numbers, RN   Rockwell Alexandria, RN AP K[facility]

## 2022-06-28 NOTE — Progress Notes (Signed)
Patient hemodialysis completed, removed 3 liters of fluid, patient tolerated treatment well. Patient ate 75%  of lunch meal . Nitroglycerin discontinued and patient started Nicardipine, will continue to monitor SBP and titrate medication for goal of 150.

## 2022-06-28 NOTE — Plan of Care (Signed)
  Problem: Education: Goal: Knowledge of General Education information will improve Description: Including pain rating scale, medication(s)/side effects and non-pharmacologic comfort measures Outcome: Progressing   Problem: Health Behavior/Discharge Planning: Goal: Ability to manage health-related needs will improve Outcome: Progressing   Problem: Clinical Measurements: Goal: Ability to maintain clinical measurements within normal limits will improve Outcome: Progressing Goal: Will remain free from infection Outcome: Progressing Goal: Diagnostic test results will improve Outcome: Progressing Goal: Respiratory complications will improve Outcome: Progressing Goal: Cardiovascular complication will be avoided Outcome: Progressing   Problem: Activity: Goal: Risk for activity intolerance will decrease Outcome: Progressing   Problem: Nutrition: Goal: Adequate nutrition will be maintained Outcome: Progressing   Problem: Coping: Goal: Level of anxiety will decrease Outcome: Progressing   Problem: Elimination: Goal: Will not experience complications related to bowel motility Outcome: Progressing Goal: Will not experience complications related to urinary retention Outcome: Progressing   Problem: Pain Managment: Goal: General experience of comfort will improve Outcome: Progressing   Problem: Safety: Goal: Ability to remain free from injury will improve Outcome: Progressing   Problem: Skin Integrity: Goal: Risk for impaired skin integrity will decrease Outcome: Progressing   Problem: Education: Goal: Ability to demonstrate management of disease process will improve Outcome: Progressing Goal: Ability to verbalize understanding of medication therapies will improve Outcome: Progressing Goal: Individualized Educational Video(s) Outcome: Progressing   Problem: Activity: Goal: Capacity to carry out activities will improve Outcome: Progressing   Problem: Cardiac: Goal:  Ability to achieve and maintain adequate cardiopulmonary perfusion will improve Outcome: Progressing   Problem: Education: Goal: Ability to describe self-care measures that may prevent or decrease complications (Diabetes Survival Skills Education) will improve Outcome: Progressing Goal: Individualized Educational Video(s) Outcome: Progressing   Problem: Coping: Goal: Ability to adjust to condition or change in health will improve Outcome: Progressing   Problem: Fluid Volume: Goal: Ability to maintain a balanced intake and output will improve Outcome: Progressing   Problem: Health Behavior/Discharge Planning: Goal: Ability to identify and utilize available resources and services will improve Outcome: Progressing Goal: Ability to manage health-related needs will improve Outcome: Progressing   Problem: Metabolic: Goal: Ability to maintain appropriate glucose levels will improve Outcome: Progressing   Problem: Nutritional: Goal: Maintenance of adequate nutrition will improve Outcome: Progressing Goal: Progress toward achieving an optimal weight will improve Outcome: Progressing   Problem: Skin Integrity: Goal: Risk for impaired skin integrity will decrease Outcome: Progressing   Problem: Tissue Perfusion: Goal: Adequacy of tissue perfusion will improve Outcome: Progressing   Problem: Education: Goal: Ability to demonstrate management of disease process will improve Outcome: Progressing Goal: Ability to verbalize understanding of medication therapies will improve Outcome: Progressing Goal: Individualized Educational Video(s) Outcome: Progressing   Problem: Activity: Goal: Capacity to carry out activities will improve Outcome: Progressing   Problem: Cardiac: Goal: Ability to achieve and maintain adequate cardiopulmonary perfusion will improve Outcome: Progressing   

## 2022-06-28 NOTE — Progress Notes (Signed)
[facility] Progress Note   Subjective:   Pt has been initiated on dialysis over the weekend-  1st tx on 2/2, second on 2/3 and third slated for today.  BP is still high still on NTG drip.  Is asking what he can do to get his kidney function better  -------------- Background on consult:  Matthew Robles is a 67 y.o. male with a history of HTN, DM, hx CVA, and CKD stage IV who presented to [facility] with shortness of breath.  He has been placed on BIPAP and then was just transitioned to 15 liters oxygen.  He was given lasix 80 mg IV with no response.  He had a bladder scan with minimal retained and foley was ordered.  He declined.  He previously followed with Dr. Theador Hawthorne but states that he hasn't seen him in a while as his kidney function was abnormal but stable.  He does not have dialysis access in place. He states that his breathing is much better since when he got here.  He's concerned because he still hasn't urinated after getting lasix 80 mg IV this AM.  Can't lie flat.  He hasn't had any sick contacted.  He has had a cough which he attributes to fluid overload.     Intake/Output Summary (Last 24 hours) at 06/28/2022 0847 Last data filed at 06/28/2022 0719 Gross per 24 hour  Intake 359.88 ml  Output 250 ml  Net 109.88 ml    Vitals:  Vitals:   06/28/22 0630 06/28/22 0645 06/28/22 0733 06/28/22 0829  BP: (!) 169/89 (!) 182/89  (!) 202/95  Pulse: 78 78  84  Resp: 15 16  15   Temp:   98.6 F (37 C)   TempSrc:   Oral   SpO2: 99% 99%  (!) 89%  Weight:      Height:         Physical Exam:  General: adult male in bed in NAD at rest  HEENT: NCAT Eyes: EOMI sclera anicteric Neck: supple trachea midline  Heart: S1S2 no rub Lungs: reduced on auscultation; unlabored at rest; on 10 liters oxygen Abdomen: soft/nt/nd Extremities: 2+ edema; no cyanosis or clubbing  Neuro: alert and oriented x 3 provides hx and follows commands  Psych normal mood and  affect    Medications reviewed   Labs:     Latest Ref Rng & Units 06/28/2022    4:19 AM 06/27/2022    4:43 AM 06/26/2022    4:11 AM  BMP  Glucose 70 - 99 mg/dL 91  103  108   BUN 8 - 23 mg/dL 51  41  60   Creatinine 0.61 - 1.24 mg/dL 9.26  8.00  10.26   Sodium 135 - 145 mmol/L 131  133  133   Potassium 3.5 - 5.1 mmol/L 3.6  3.2  3.3   Chloride 98 - 111 mmol/L 99  100  102   CO2 22 - 32 mmol/L 21  23  18    Calcium 8.9 - 10.3 mg/dL 6.3  6.5  6.7      Assessment/Plan:   # CKD stage IV with progression to ESRD -   He has progressed to ESRD - He consents for dialysis - s/p  TDC 2/2.  1st on 2/2, 2nd on 2/3 and third slated for today 2/5 - Dr. Royce Macadamia contacted the SW to set up outpatient HD unit - he previously followed with Dr. Theador Hawthorne   # HTN emergency  - on nitro  gtt   - continue diuresis/UF as well  -  on metoprolol and hydralazine.  Will simplify to losartan daily at night and stop hydralazine   # Acute hypoxic respiratory failure   - covid, flu and RSV negative  - s/p bipap - optimize volume status as above   # Anemia - normocytic  - s/p PRBC's on 2/1. Iron deficiency noted - PRBC's per primary team -  I will give iron and ESA   # Metabolic bone disease - Hypocalcemia - Intact PTH 418 Start calcitriol   # Hypokalemia - replete gently /high k bath   # Hyponatremia - initiating HD. Impaired free water excretion     Louis Meckel, MD 06/28/2022 8:47 AM

## 2022-06-28 NOTE — Progress Notes (Signed)
PROGRESS NOTE   Matthew Robles  FIE:[id number] DOB: 02-08-1956 DOA: 06/24/2022 PCP: Pcp, No   Chief Complaint  Patient presents with   Respiratory Distress   Level of care: Stepdown  Brief Admission History:  67 year old gentleman with longstanding hypertension since age 60, stage IV CKD, type 2 diabetes mellitus with neuropathy, gout, sleep disorder, history of stroke, congestive heart failure, anemia, chronic edema who established nephrology care with Dr. Trinda Pascal on 06/16/2021.  He apparently was lost to follow up.   He reported that over the past 2 weeks has had progressive symptoms of increasing edema in the lower extremities and poor urine output.  He reports that he noticed less than 2 ounces of urine in the past couple of days.  He is also had a cough and increasing shortness of breath that has worsened over the past 3 days.  He denies chest pain and palpitations.  He has noticed significant increase in the edema in the legs.  It is affecting his ambulation.  He also noticed increased abdominal distention from edema.  He reports that he has been taking his medications for blood pressure.  He reports that he takes amlodipine and hydralazine.  He does have a history of poor compliance with medications.     Patient presented to the emergency department with acute respiratory distress that woke him up this morning.  His pulse ox was 60%.  EMS had him on CPAP.  The ED placed him on BiPAP with improvement in symptoms.  His chest x-ray showed pulmonary edema and bilateral pleural effusions.  His BNP was markedly elevated.  He appears clinically volume overloaded.  He was given IV Lasix with no significant urine output.  His creatinine was elevated at 12.  His blood pressure was markedly elevated and he was placed on an IV nitroglycerin infusion.  Nephrology was consulted.  He is being admitted for management of hypertensive emergency.   Assessment and Plan:  Hypertensive emergency  - pt  presented with markedly elevated BP and significant renal failure - weaning off IV nitroglycerin - HD treatments for volume removal  - continue to wean off nitro infusion  - nephrology team started losartan, continue metoprolol, stopped hydralazine   AKI on CKD stage IV--->now ESRD  - records indicate he was stage IV CKD 1 year ago - he is having poor urinary output at this time - foley catheter discontinued  - appreciate nephrology consultation - nephrology started HD 2/2 after temp cath placed by surgery - Daily renal function panel ordered   Non Anion Gap Metabolic Acidosis - secondary to renal failure  - HD treatments started   - nephrology team consulting    Acute HFpEF  - LVEF 50-55% with grade 2 DD from 2019 TTE - IV lasix and metolazone as ordered - if no response, plan is for starting hemodialysis 2/2 per nephrology team - update TTE ordered, EF down to 45-50% - CXR 2/5 showing some improvement in pulmonary edema      Acute respiratory failure with hypoxia  - secondary to pulmonary edema, Acute HFpEF - pt responded well to bipap therapy - he is now off bipap therapy but will leave for PRN treatment and QHS treatment - volume removal is the treatment, nephrology team planning HD on 2/2 - transfer to telemetry  - RSV, Covid, Flu testing negative    Type 2 Diabetes Mellitus with neuropathy  - Carb modified diet ordered - very sensitive SSI coverage and frequent CBG monitoring at  least 5x per day CBG (last 3)  Recent Labs    06/27/22 1641 06/27/22 2200 06/28/22 0733  GLUCAP 100* 93 94     Leukocytosis - resolved - suspect this is reactive - resolved    Hypokalemia - ordering diet now, will let him eat - following daily renal function panel - improved   Bilateral Pleural Effusion  - secondary to volume overload  - treating with diuresis vs hemodialysis - hemodialysis started 2/2 per nephrology - remains fluid overloaded but much improved  - CXR showing  improvement in pulmonary edema     Anemia in CKD  - Hg down to 6 - transfused 1 unit PRBC on 06/24/22 - transfuse 1 unit PRBC with HD on 06/26/22 - follow up anemia panel - Hg improved to 8 after 2nd unit given on 2/3 - recheck CBC in AM    DVT prophylaxis: [place] heparin Code Status: full  Family Communication: discussed with patient at bedside, he verbalized understanding  Disposition: Status is: Inpatient Remains inpatient appropriate because: IV treatments    Consultants:  Nephrology Surgery  Procedures:  Dialysis cath placement 2/2 Hemodialysis 2/2, 2/3, 2/5 Antimicrobials:    Subjective: Much less SOB now and tolerating HD treatments with no problem so far   Objective: Vitals:   06/28/22 0630 06/28/22 0645 06/28/22 0733 06/28/22 0829  BP: (!) 169/89 (!) 182/89  (!) 202/95  Pulse: 78 78  84  Resp: 15 16  15   Temp:   98.6 F (37 C)   TempSrc:   Oral   SpO2: 99% 99%  (!) 89%  Weight:      Height:        Intake/Output Summary (Last 24 hours) at 06/28/2022 1018 Last data filed at 06/28/2022 0931 Gross per 24 hour  Intake 479.88 ml  Output 250 ml  Net 229.88 ml   Filed Weights   06/26/22 2130 06/27/22 0500 06/28/22 0528  Weight: 88 kg 88.6 kg 89.1 kg   Examination:  General exam: Appears calm and comfortable, sitting in chair, no distress seen.   Respiratory system: improving crackles, no increased work of breathing.  Cardiovascular system: normal S1 & S2 heard. Mild JVD, soft systolic murmurs, No rubs, gallops or clicks. 1+ pedal edema. Gastrointestinal system: Abdomen is mildly distended, soft and nontender. No organomegaly or masses felt. Normal bowel sounds heard. Central nervous system: Alert and oriented. No focal neurological deficits. Extremities: trace lower edema, Symmetric 5 x 5 power. Skin: No rashes, lesions or ulcers. Psychiatry: Judgement and insight appear normal. Mood & affect appropriate.   Data Reviewed: I have personally reviewed following labs  and imaging studies  CBC: Recent Labs  Lab 06/24/22 0532 06/24/22 1136 06/24/22 2238 06/25/22 0427 06/26/22 0411 06/27/22 0443 06/28/22 0419  WBC 13.4* 11.7*  --  10.9* 8.8 9.1 10.2  NEUTROABS 7.1  --   --  7.1 5.8  --   --   HGB 8.4* 6.7* 7.4* 7.4* 6.9* 8.0* 7.8*  HCT 27.1* 20.6* 22.3* 22.7* 21.3* 24.6* 24.2*  MCV 97.8 94.5  --  93.0 92.2 92.1 93.4  PLT 298 235  --  198 163 178 153    Basic Metabolic Panel: Recent Labs  Lab 06/24/22 0532 06/24/22 1136 06/25/22 0427 06/26/22 0411 06/27/22 0443 06/28/22 0419  NA 133*  --  128* 133* 133* 131*  K 3.2*  --  3.2* 3.3* 3.2* 3.6  CL 103  --  104 102 100 99  CO2 15*  --  15*  18* 23 21*  GLUCOSE 228*  --  102* 108* 103* 91  BUN 76*  --  78* 60* 41* 51*  CREATININE 12.15* 12.27* 12.34* 10.26* 8.00* 9.26*  CALCIUM 6.7*  --  6.3* 6.7* 6.5* 6.3*  MG 2.5*  --  2.3 2.2  --   --   PHOS  --   --  7.8* 6.9* 5.0* 6.2*    CBG: Recent Labs  Lab 06/27/22 0746 06/27/22 1154 06/27/22 1641 06/27/22 2200 06/28/22 0733  GLUCAP 110* 121* 100* 93 94    Recent Results (from the past 240 hour(s))  Resp panel by RT-PCR (RSV, Flu A&B, Covid) Anterior Nasal Swab     Status: None   Collection Time: 06/24/22 10:12 AM   Specimen: Anterior Nasal Swab  Result Value Ref Range Status   SARS Coronavirus 2 by RT PCR NEGATIVE NEGATIVE Final    Comment: (NOTE) SARS-CoV-2 target nucleic acids are NOT DETECTED.  The SARS-CoV-2 RNA is generally detectable in upper respiratory specimens during the acute phase of infection. The lowest concentration of SARS-CoV-2 viral copies this assay can detect is 138 copies/mL. A negative result does not preclude SARS-Cov-2 infection and should not be used as the sole basis for treatment or other patient management decisions. A negative result may occur with  improper specimen collection/handling, submission of specimen other than nasopharyngeal swab, presence of viral mutation(s) within the areas targeted by  this assay, and inadequate number of viral copies(<138 copies/mL). A negative result must be combined with clinical observations, patient history, and epidemiological information. The expected result is Negative.  Fact Sheet for Patients:  EntrepreneurPulse.com.au  Fact Sheet for Healthcare Providers:  IncredibleEmployment.be  This test is no t yet approved or cleared by the [place] FDA and  has been authorized for detection and/or diagnosis of SARS-CoV-2 by FDA under an Emergency Use Authorization (EUA). This EUA will remain  in effect (meaning this test can be used) for the duration of the COVID-19 declaration under Section 564(b)(1) of the Act, 21 U.S.C.section 360bbb-3(b)(1), unless the authorization is terminated  or revoked sooner.       Influenza A by PCR NEGATIVE NEGATIVE Final   Influenza B by PCR NEGATIVE NEGATIVE Final    Comment: (NOTE) The Xpert Xpress SARS-CoV-2/FLU/RSV plus assay is intended as an aid in the diagnosis of influenza from Nasopharyngeal swab specimens and should not be used as a sole basis for treatment. Nasal washings and aspirates are unacceptable for Xpert Xpress SARS-CoV-2/FLU/RSV testing.  Fact Sheet for Patients: EntrepreneurPulse.com.au  Fact Sheet for Healthcare Providers: IncredibleEmployment.be  This test is not yet approved or cleared by the [place] FDA and has been authorized for detection and/or diagnosis of SARS-CoV-2 by FDA under an Emergency Use Authorization (EUA). This EUA will remain in effect (meaning this test can be used) for the duration of the COVID-19 declaration under Section 564(b)(1) of the Act, 21 U.S.C. section 360bbb-3(b)(1), unless the authorization is terminated or revoked.     Resp Syncytial Virus by PCR NEGATIVE NEGATIVE Final    Comment: (NOTE) Fact Sheet for Patients: EntrepreneurPulse.com.au  Fact Sheet  for Healthcare Providers: IncredibleEmployment.be  This test is not yet approved or cleared by the [place] FDA and has been authorized for detection and/or diagnosis of SARS-CoV-2 by FDA under an Emergency Use Authorization (EUA). This EUA will remain in effect (meaning this test can be used) for the duration of the COVID-19 declaration under Section 564(b)(1) of the Act, 21 U.S.C. section 360bbb-3(b)(1),  unless the authorization is terminated or revoked.  Performed at [facility], [address] [place], [place] [address]   MRSA Next Gen by PCR, Nasal     Status: None   Collection Time: 06/24/22 10:12 AM   Specimen: Anterior Nasal Swab  Result Value Ref Range Status   MRSA by PCR Next Gen NOT DETECTED NOT DETECTED Final    Comment: (NOTE) The GeneXpert MRSA Assay (FDA approved for NASAL specimens only), is one component of a comprehensive MRSA colonization surveillance program. It is not intended to diagnose MRSA infection nor to guide or monitor treatment for MRSA infections. Test performance is not FDA approved in patients less than 75 years old. Performed at [facility], [address] [place], [place] [address]      Radiology Studies: DG CHEST PORT 1 VIEW  Result Date: 06/28/2022 CLINICAL DATA:  Pulmonary edema EXAM: PORTABLE CHEST 1 VIEW COMPARISON:  Chest radiograph 06/25/2022 FINDINGS: Right-sided central venous catheter with the tip in the mid SVC, unchanged. Right costophrenic angle is out of the field of view. Possible small left pleural effusion. No pneumothorax. Unchanged cardiac mediastinal contours. No focal airspace opacity. There are prominent bilateral interstitial opacities, which could represent pulmonary venous congestion or mild pulmonary edema. Degenerative changes of the right AC joint. No radiographically apparent displaced rib fractures. IMPRESSION: Prominent bilateral interstitial opacities, which could represent pulmonary venous  congestion or mild pulmonary edema. Unchanged small left pleural effusion. Electronically Signed   By: Marin Roberts M.D.   On: 06/28/2022 06:54    Scheduled Meds:  sodium chloride   Intravenous Once   calcitRIOL  0.5 mcg Oral Daily   Chlorhexidine Gluconate Cloth  6 each Topical Daily   Chlorhexidine Gluconate Cloth  6 each Topical Q0600   Chlorhexidine Gluconate Cloth  6 each Topical Q0600   Chlorhexidine Gluconate Cloth  6 each Topical Q0600   darbepoetin (ARANESP) injection - DIALYSIS  200 mcg Subcutaneous Q Mon-1800   furosemide  80 mg Intravenous Daily   heparin  5,000 Units Subcutaneous Q8H   hydrALAZINE  100 mg Oral TID   insulin aspart  0-6 Units Subcutaneous TID WC   insulin aspart  2 Units Subcutaneous TID WC   losartan  100 mg Oral QHS   metoprolol tartrate  100 mg Oral BID   Continuous Infusions:  ferric gluconate (FERRLECIT) IVPB     nitroGLYCERIN 30 mcg/min (06/28/22 0745)     LOS: 4 days   Time Spent: 35 mins   Alonte Wulff, MD How to contact the [facility] Attending or Consulting provider [place] or covering provider during after hours [place], for this patient?  Check the care team in [facility] and look for a) attending/consulting TR[facility] provider listed and b) the [facility] team listed Log into [email or web address] and use [place]'s universal password to access. If you do not have the password, please contact the hospital operator. Locate the [facility] provider you are looking for under [facility] and page to a number that you can be directly reached. If you still have difficulty reaching the provider, please page the [facility] (Director on Call) for the Hospitalists listed on amion for assistance.  06/28/2022, 10:18 AM

## 2022-06-28 NOTE — Progress Notes (Signed)
Pt awake and alert, ate 75% of morning breakfast, transferred to and from [facility]. Patient is now having bedside dialysis rendered by dialysis nurse.

## 2022-06-29 DIAGNOSIS — N185 Chronic kidney disease, stage 5: Secondary | ICD-10-CM

## 2022-06-29 DIAGNOSIS — I509 Heart failure, unspecified: Secondary | ICD-10-CM

## 2022-06-29 LAB — RENAL FUNCTION PANEL
Albumin: 1.7 g/dL — ABNORMAL LOW (ref 3.5–5.0)
Anion gap: 8 (ref 5–15)
BUN: 35 mg/dL — ABNORMAL HIGH (ref 8–23)
CO2: 25 mmol/L (ref 22–32)
Calcium: 6.4 mg/dL — CL (ref 8.9–10.3)
Chloride: 98 mmol/L (ref 98–111)
Creatinine, Ser: 6.81 mg/dL — ABNORMAL HIGH (ref 0.61–1.24)
GFR, Estimated: 8 mL/min — ABNORMAL LOW (ref >60)
Glucose, Bld: 109 mg/dL — ABNORMAL HIGH (ref 70–99)
Phosphorus: 4.7 mg/dL — ABNORMAL HIGH (ref 2.5–4.6)
Potassium: 3.3 mmol/L — ABNORMAL LOW (ref 3.5–5.1)
Sodium: 131 mmol/L — ABNORMAL LOW (ref 135–145)

## 2022-06-29 LAB — CBC
HCT: 25.1 % — ABNORMAL LOW (ref 39.0–52.0)
Hemoglobin: 8.1 g/dL — ABNORMAL LOW (ref 13.0–17.0)
MCH: 29.9 pg (ref 26.0–34.0)
MCHC: 32.3 g/dL (ref 30.0–36.0)
MCV: 92.6 fL (ref 80.0–100.0)
Platelets: 218 10*3/uL (ref 150–400)
RBC: 2.71 MIL/uL — ABNORMAL LOW (ref 4.22–5.81)
RDW: 14.8 % (ref 11.5–15.5)
WBC: 10.3 10*3/uL (ref 4.0–10.5)
nRBC: 0 % (ref 0.0–0.2)

## 2022-06-29 LAB — GLUCOSE, CAPILLARY
Glucose-Capillary: 102 mg/dL — ABNORMAL HIGH (ref 70–99)
Glucose-Capillary: 106 mg/dL — ABNORMAL HIGH (ref 70–99)
Glucose-Capillary: 125 mg/dL — ABNORMAL HIGH (ref 70–99)
Glucose-Capillary: 123 mg/dL — ABNORMAL HIGH (ref 70–99)
Glucose-Capillary: 92 mg/dL (ref 70–99)

## 2022-06-29 LAB — HEPATITIS B CORE ANTIBODY, TOTAL: Hep B Core Total Ab: NONREACTIVE

## 2022-06-29 MED ORDER — ALBUTEROL SULFATE (2.5 MG/3ML) 0.083% IN NEBU
2.5000 mg | INHALATION_SOLUTION | Freq: Four times a day (QID) | RESPIRATORY_TRACT | Status: DC | PRN
  Administered 2022-06-29 – 2022-07-04 (×3): 2.5 mg via RESPIRATORY_TRACT
  Filled 2022-06-29 (×3): qty 3

## 2022-06-29 MED ORDER — AMLODIPINE BESYLATE 5 MG PO TABS
10.0000 mg | ORAL_TABLET | Freq: Every day | ORAL | Status: DC
  Administered 2022-06-29 – 2022-07-05 (×7): 10 mg via ORAL
  Filled 2022-06-29 (×7): qty 2

## 2022-06-29 NOTE — Plan of Care (Signed)
  Problem: Education: Goal: Knowledge of General Education information will improve Description: Including pain rating scale, medication(s)/side effects and non-pharmacologic comfort measures Outcome: Progressing   Problem: Health Behavior/Discharge Planning: Goal: Ability to manage health-related needs will improve Outcome: Progressing   Problem: Clinical Measurements: Goal: Ability to maintain clinical measurements within normal limits will improve Outcome: Progressing Goal: Will remain free from infection Outcome: Progressing Goal: Diagnostic test results will improve Outcome: Progressing Goal: Respiratory complications will improve Outcome: Progressing Goal: Cardiovascular complication will be avoided Outcome: Progressing   Problem: Activity: Goal: Risk for activity intolerance will decrease Outcome: Progressing   Problem: Nutrition: Goal: Adequate nutrition will be maintained Outcome: Progressing   Problem: Coping: Goal: Level of anxiety will decrease Outcome: Progressing   Problem: Elimination: Goal: Will not experience complications related to bowel motility Outcome: Progressing Goal: Will not experience complications related to urinary retention Outcome: Progressing   Problem: Pain Managment: Goal: General experience of comfort will improve Outcome: Progressing   Problem: Safety: Goal: Ability to remain free from injury will improve Outcome: Progressing   Problem: Skin Integrity: Goal: Risk for impaired skin integrity will decrease Outcome: Progressing   Problem: Education: Goal: Ability to demonstrate management of disease process will improve Outcome: Progressing Goal: Ability to verbalize understanding of medication therapies will improve Outcome: Progressing Goal: Individualized Educational Video(s) Outcome: Progressing   Problem: Activity: Goal: Capacity to carry out activities will improve Outcome: Progressing   Problem: Cardiac: Goal:  Ability to achieve and maintain adequate cardiopulmonary perfusion will improve Outcome: Progressing   Problem: Education: Goal: Ability to describe self-care measures that may prevent or decrease complications (Diabetes Survival Skills Education) will improve Outcome: Progressing Goal: Individualized Educational Video(s) Outcome: Progressing   Problem: Coping: Goal: Ability to adjust to condition or change in health will improve Outcome: Progressing   Problem: Fluid Volume: Goal: Ability to maintain a balanced intake and output will improve Outcome: Progressing   Problem: Health Behavior/Discharge Planning: Goal: Ability to identify and utilize available resources and services will improve Outcome: Progressing Goal: Ability to manage health-related needs will improve Outcome: Progressing   Problem: Metabolic: Goal: Ability to maintain appropriate glucose levels will improve Outcome: Progressing   Problem: Nutritional: Goal: Maintenance of adequate nutrition will improve Outcome: Progressing Goal: Progress toward achieving an optimal weight will improve Outcome: Progressing   Problem: Skin Integrity: Goal: Risk for impaired skin integrity will decrease Outcome: Progressing   Problem: Tissue Perfusion: Goal: Adequacy of tissue perfusion will improve Outcome: Progressing   Problem: Education: Goal: Ability to demonstrate management of disease process will improve Outcome: Progressing Goal: Ability to verbalize understanding of medication therapies will improve Outcome: Progressing Goal: Individualized Educational Video(s) Outcome: Progressing   Problem: Activity: Goal: Capacity to carry out activities will improve Outcome: Progressing   Problem: Cardiac: Goal: Ability to achieve and maintain adequate cardiopulmonary perfusion will improve Outcome: Progressing   

## 2022-06-29 NOTE — Progress Notes (Signed)
[facility] Progress Note   Subjective:   S/p third HD yest-  removed 3 liters-  HTN remains-  now on cardene drip-  has appropriate questions about his future , more open to education regarding AVF-  home options  -------------- Background on consult:  Zak Gondek is a 67 y.o. male with a history of HTN, DM, hx CVA, and CKD stage IV who presented to [facility] with shortness of breath.  He has been placed on BIPAP and then was just transitioned to 15 liters oxygen.  He was given lasix 80 mg IV with no response.  He had a bladder scan with minimal retained and foley was ordered.  He declined.  He previously followed with Dr. Theador Hawthorne but states that he hasn't seen him in a while as his kidney function was abnormal but stable.  He does not have dialysis access in place. He states that his breathing is much better since when he got here.  He's concerned because he still hasn't urinated after getting lasix 80 mg IV this AM.  Can't lie flat.  He hasn't had any sick contacted.  He has had a cough which he attributes to fluid overload.     Intake/Output Summary (Last 24 hours) at 06/29/2022 0759 Last data filed at 06/29/2022 0754 Gross per 24 hour  Intake 1881.18 ml  Output 3325 ml  Net -1443.82 ml    Vitals:  Vitals:   06/29/22 0715 06/29/22 0730 06/29/22 0733 06/29/22 0745  BP: (!) 167/85 (!) 164/90  (!) 169/83  Pulse: 82 83  89  Resp:      Temp:   98.4 F (36.9 C)   TempSrc:   Oral   SpO2: 96% 91%  93%  Weight:      Height:         Physical Exam:  General: adult male in bed in NAD at rest  HEENT: NCAT Eyes: EOMI sclera anicteric Neck: supple trachea midline  Heart: S1S2 no rub Lungs: reduced on auscultation; unlabored at rest; on 10 liters oxygen Abdomen: soft/nt/nd Extremities: 1+ edema; no cyanosis or clubbing  Neuro: alert and oriented x 3 provides hx and follows commands  Psych normal mood and affect    Medications reviewed   Labs:     Latest Ref  Rng & Units 06/29/2022    4:38 AM 06/28/2022    4:19 AM 06/27/2022    4:43 AM  BMP  Glucose 70 - 99 mg/dL 109  91  103   BUN 8 - 23 mg/dL 35  51  41   Creatinine 0.61 - 1.24 mg/dL 6.81  9.26  8.00   Sodium 135 - 145 mmol/L 131  131  133   Potassium 3.5 - 5.1 mmol/L 3.3  3.6  3.2   Chloride 98 - 111 mmol/L 98  99  100   CO2 22 - 32 mmol/L 25  21  23    Calcium 8.9 - 10.3 mg/dL 6.4  6.3  6.5      Assessment/Plan:   # CKD stage IV with progression to ESRD -   He has progressed to ESRD - He consents for dialysis - s/p  TDC 2/2.  1st on 2/2, 2nd on 2/3 and third  2/5- plan on 4th tx on 2/7 - Dr. Royce Macadamia contacted the SW to set up outpatient HD unit - he previously followed with Dr. Theador Hawthorne-  wants to go to the unit that is better performing -  I would say that  is [facility]-  am also going to contact Dr. Donnetta Hutching about AVF placement    # HTN emergency  - on nitro gtt   - continue diuresis/UF  -  was on metoprolol and hydralazine.  Will simplify to losartan daily at night and stop hydralazine-  got first dose last night-  also ordered amlodipine to start today to try and get him off the drip    # Acute hypoxic respiratory failure   - covid, flu and RSV negative  - s/p bipap - optimize volume status as above   # Anemia - normocytic  - s/p PRBC's on 2/1. Iron deficiency noted - PRBC's per primary team -  giving iron and ESA   # Metabolic bone disease - Hypocalcemia-  but corrected is OK  - Intact PTH 418 Started calcitriol   # Hypokalemia - replete gently /high k bath   # Hyponatremia - initiating HD. Impaired free water excretion     Louis Meckel, MD 06/29/2022 7:59 AM

## 2022-06-29 NOTE — Progress Notes (Signed)
PROGRESS NOTE   Matthew Robles  [id number]:[id number] DOB: 10/28/1955 DOA: 06/24/2022 PCP: Pcp, No   Chief Complaint  Patient presents with   Respiratory Distress   Level of care: Stepdown  Brief Admission History:  67 year old gentleman with longstanding hypertension since age 43, stage IV CKD, type 2 diabetes mellitus with neuropathy, gout, sleep disorder, history of stroke, congestive heart failure, anemia, chronic edema who established nephrology care with Dr. Trinda Pascal on 06/16/2021.  He apparently was lost to follow up.   He reported that over the past 2 weeks has had progressive symptoms of increasing edema in the lower extremities and poor urine output.  He reports that he noticed less than 2 ounces of urine in the past couple of days.  He is also had a cough and increasing shortness of breath that has worsened over the past 3 days.  He denies chest pain and palpitations.  He has noticed significant increase in the edema in the legs.  It is affecting his ambulation.  He also noticed increased abdominal distention from edema.  He reports that he has been taking his medications for blood pressure.  He reports that he takes amlodipine and hydralazine.  He does have a history of poor compliance with medications.     Patient presented to the emergency department with acute respiratory distress that woke him up this morning.  His pulse ox was 60%.  EMS had him on CPAP.  The ED placed him on BiPAP with improvement in symptoms.  His chest x-ray showed pulmonary edema and bilateral pleural effusions.  His BNP was markedly elevated.  He appears clinically volume overloaded.  He was given IV Lasix with no significant urine output.  His creatinine was elevated at 12.  His blood pressure was markedly elevated and he was placed on an IV nitroglycerin infusion.  Nephrology was consulted.  He is being admitted for management of hypertensive emergency.   Assessment and Plan:  Hypertensive emergency  - pt  presented with markedly elevated BP and significant renal failure - now is off IV nitroglycerin (ineffective); started IV nicardipine on 2/5   - HD treatments for volume removal  - continue to wean off nitro infusion  - nephrology team started losartan, continue metoprolol, stopped hydralazine, started amlodipine, goal is to wean off IV nicardipine infusion   AKI on CKD stage IV--->now ESRD  - records indicate he was stage IV CKD 1 year ago - he is having poor urinary output at this time - foley catheter discontinued  - appreciate nephrology consultation - nephrology started HD 2/2 after temp cath placed by surgery - Daily renal function panel ordered   Non Anion Gap Metabolic Acidosis - resolved  - secondary to acute renal failure  - HD treatments started and acidosis has resolved   - nephrology team consulting    Acute HFpEF  - LVEF 50-55% with grade 2 DD from 2019 TTE - IV lasix and metolazone as ordered - if no response, plan is for starting hemodialysis 2/2 per nephrology team - update TTE ordered, EF down to 45-50% - CXR 2/5 showing some improvement in pulmonary edema      Acute respiratory failure with hypoxia  - secondary to pulmonary edema, Acute HFpEF - pt responded well to bipap therapy - he is now off bipap therapy but will leave for PRN treatment and QHS treatment - volume removal is the treatment, nephrology team planning HD on 2/2 - transfer to telemetry  - RSV, Covid, Flu  testing negative    Type 2 Diabetes Mellitus with neuropathy  - Carb modified diet ordered - very sensitive SSI coverage and frequent CBG monitoring at least 5x per day CBG (last 3)  Recent Labs    06/29/22 0300 06/29/22 0732 06/29/22 1107  GLUCAP 102* 106* 125*     Leukocytosis - resolved - suspect this is reactive - resolved    Hypokalemia - ordering diet now, will let him eat - following daily renal function panel - improved   Bilateral Pleural Effusion  - secondary to  volume overload  - treating with diuresis vs hemodialysis - hemodialysis started 2/2 per nephrology - remains fluid overloaded but much improved  - CXR showing improvement in pulmonary edema     Anemia in CKD  - Hg down to 6 - transfused 1 unit PRBC on 06/24/22 - transfuse 1 unit PRBC with HD on 06/26/22 - follow up anemia panel - Hg improved to 8 after 2nd unit given on 2/3 - recheck CBC in AM    DVT prophylaxis: [place] heparin Code Status: full  Family Communication: discussed with patient at bedside, he verbalized understanding  Disposition: Status is: Inpatient Remains inpatient appropriate because: IV treatments    Consultants:  Nephrology Surgery  Procedures:  Dialysis cath placement 2/2 Hemodialysis 2/2, 2/3, 2/5 Antimicrobials:    Subjective: Pt denies CP, he says that he tolerated his last HD treatment   Objective: Vitals:   06/29/22 1108 06/29/22 1130 06/29/22 1200 06/29/22 1215  BP:  (!) 190/88 (!) 174/88   Pulse:  77 82 80  Resp:  20 20 19   Temp: 98.6 F (37 C)     TempSrc: Oral     SpO2:   (!) 89% 90%  Weight:      Height:        Intake/Output Summary (Last 24 hours) at 06/29/2022 1236 Last data filed at 06/29/2022 0916 Gross per 24 hour  Intake 1802.16 ml  Output 3325 ml  Net -1522.84 ml   Filed Weights   06/28/22 0945 06/28/22 1315 06/29/22 0600  Weight: 89.1 kg 86 kg 88.1 kg   Examination:  General exam: Appears calm and comfortable, sitting in chair, no distress seen.   Respiratory system: improving crackles, no increased work of breathing.  Cardiovascular system: normal S1 & S2 heard. Mild JVD, soft systolic murmurs, No rubs, gallops or clicks. 1+ pedal edema. Gastrointestinal system: Abdomen is mildly distended, soft and nontender. No organomegaly or masses felt. Normal bowel sounds heard. Central nervous system: Alert and oriented. No focal neurological deficits. Extremities: trace lower edema, Symmetric 5 x 5 power. Skin: No rashes, lesions or  ulcers. Psychiatry: Judgement and insight appear normal. Mood & affect appropriate.   Data Reviewed: I have personally reviewed following labs and imaging studies  CBC: Recent Labs  Lab 06/24/22 0532 06/24/22 1136 06/25/22 0427 06/26/22 0411 06/27/22 0443 06/28/22 0419 06/29/22 0438  WBC 13.4*   < > 10.9* 8.8 9.1 10.2 10.3  NEUTROABS 7.1  --  7.1 5.8  --   --   --   HGB 8.4*   < > 7.4* 6.9* 8.0* 7.8* 8.1*  HCT 27.1*   < > 22.7* 21.3* 24.6* 24.2* 25.1*  MCV 97.8   < > 93.0 92.2 92.1 93.4 92.6  PLT 298   < > 198 163 178 153 218   < > = values in this interval not displayed.    Basic Metabolic Panel: Recent Labs  Lab 06/24/22 0532 06/24/22 1136  06/25/22 0427 06/26/22 0411 06/27/22 0443 06/28/22 0419 06/29/22 0438  NA 133*  --  128* 133* 133* 131* 131*  K 3.2*  --  3.2* 3.3* 3.2* 3.6 3.3*  CL 103  --  104 102 100 99 98  CO2 15*  --  15* 18* 23 21* 25  GLUCOSE 228*  --  102* 108* 103* 91 109*  BUN 76*  --  78* 60* 41* 51* 35*  CREATININE 12.15*   < > 12.34* 10.26* 8.00* 9.26* 6.81*  CALCIUM 6.7*  --  6.3* 6.7* 6.5* 6.3* 6.4*  MG 2.5*  --  2.3 2.2  --   --   --   PHOS  --   --  7.8* 6.9* 5.0* 6.2* 4.7*   < > = values in this interval not displayed.    CBG: Recent Labs  Lab 06/28/22 1606 06/28/22 2215 06/29/22 0300 06/29/22 0732 06/29/22 1107  GLUCAP 160* 139* 102* 106* 125*    Recent Results (from the past 240 hour(s))  Resp panel by RT-PCR (RSV, Flu A&B, Covid) Anterior Nasal Swab     Status: None   Collection Time: 06/24/22 10:12 AM   Specimen: Anterior Nasal Swab  Result Value Ref Range Status   SARS Coronavirus 2 by RT PCR NEGATIVE NEGATIVE Final    Comment: (NOTE) SARS-CoV-2 target nucleic acids are NOT DETECTED.  The SARS-CoV-2 RNA is generally detectable in upper respiratory specimens during the acute phase of infection. The lowest concentration of SARS-CoV-2 viral copies this assay can detect is 138 copies/mL. A negative result does not preclude  SARS-Cov-2 infection and should not be used as the sole basis for treatment or other patient management decisions. A negative result may occur with  improper specimen collection/handling, submission of specimen other than nasopharyngeal swab, presence of viral mutation(s) within the areas targeted by this assay, and inadequate number of viral copies(<138 copies/mL). A negative result must be combined with clinical observations, patient history, and epidemiological information. The expected result is Negative.  Fact Sheet for Patients:  EntrepreneurPulse.com.au  Fact Sheet for Healthcare Providers:  IncredibleEmployment.be  This test is no t yet approved or cleared by the [place] FDA and  has been authorized for detection and/or diagnosis of SARS-CoV-2 by FDA under an Emergency Use Authorization (EUA). This EUA will remain  in effect (meaning this test can be used) for the duration of the COVID-19 declaration under Section 564(b)(1) of the Act, 21 U.S.C.section 360bbb-3(b)(1), unless the authorization is terminated  or revoked sooner.       Influenza A by PCR NEGATIVE NEGATIVE Final   Influenza B by PCR NEGATIVE NEGATIVE Final    Comment: (NOTE) The Xpert Xpress SARS-CoV-2/FLU/RSV plus assay is intended as an aid in the diagnosis of influenza from Nasopharyngeal swab specimens and should not be used as a sole basis for treatment. Nasal washings and aspirates are unacceptable for Xpert Xpress SARS-CoV-2/FLU/RSV testing.  Fact Sheet for Patients: EntrepreneurPulse.com.au  Fact Sheet for Healthcare Providers: IncredibleEmployment.be  This test is not yet approved or cleared by the [place] FDA and has been authorized for detection and/or diagnosis of SARS-CoV-2 by FDA under an Emergency Use Authorization (EUA). This EUA will remain in effect (meaning this test can be used) for the duration of  the COVID-19 declaration under Section 564(b)(1) of the Act, 21 U.S.C. section 360bbb-3(b)(1), unless the authorization is terminated or revoked.     Resp Syncytial Virus by PCR NEGATIVE NEGATIVE Final    Comment: (NOTE)  Fact Sheet for Patients: EntrepreneurPulse.com.au  Fact Sheet for Healthcare Providers: IncredibleEmployment.be  This test is not yet approved or cleared by the [place] FDA and has been authorized for detection and/or diagnosis of SARS-CoV-2 by FDA under an Emergency Use Authorization (EUA). This EUA will remain in effect (meaning this test can be used) for the duration of the COVID-19 declaration under Section 564(b)(1) of the Act, 21 U.S.C. section 360bbb-3(b)(1), unless the authorization is terminated or revoked.  Performed at [facility], [address] [place], [place] [address]   MRSA Next Gen by PCR, Nasal     Status: None   Collection Time: 06/24/22 10:12 AM   Specimen: Anterior Nasal Swab  Result Value Ref Range Status   MRSA by PCR Next Gen NOT DETECTED NOT DETECTED Final    Comment: (NOTE) The GeneXpert MRSA Assay (FDA approved for NASAL specimens only), is one component of a comprehensive MRSA colonization surveillance program. It is not intended to diagnose MRSA infection nor to guide or monitor treatment for MRSA infections. Test performance is not FDA approved in patients less than 77 years old. Performed at [facility], [address] [place], [place] [address]      Radiology Studies: DG CHEST PORT 1 VIEW  Result Date: 06/28/2022 CLINICAL DATA:  Pulmonary edema EXAM: PORTABLE CHEST 1 VIEW COMPARISON:  Chest radiograph 06/25/2022 FINDINGS: Right-sided central venous catheter with the tip in the mid SVC, unchanged. Right costophrenic angle is out of the field of view. Possible small left pleural effusion. No pneumothorax. Unchanged cardiac mediastinal contours. No focal airspace opacity. There are  prominent bilateral interstitial opacities, which could represent pulmonary venous congestion or mild pulmonary edema. Degenerative changes of the right AC joint. No radiographically apparent displaced rib fractures. IMPRESSION: Prominent bilateral interstitial opacities, which could represent pulmonary venous congestion or mild pulmonary edema. Unchanged small left pleural effusion. Electronically Signed   By: Marin Roberts M.D.   On: 06/28/2022 06:54    Scheduled Meds:  sodium chloride   Intravenous Once   amLODipine  10 mg Oral Daily   calcitRIOL  0.5 mcg Oral Daily   Chlorhexidine Gluconate Cloth  6 each Topical Daily   Chlorhexidine Gluconate Cloth  6 each Topical Q0600   darbepoetin (ARANESP) injection - DIALYSIS  200 mcg Subcutaneous Q Mon-1800   furosemide  80 mg Intravenous Daily   heparin  5,000 Units Subcutaneous Q8H   insulin aspart  0-6 Units Subcutaneous TID WC   insulin aspart  2 Units Subcutaneous TID WC   losartan  100 mg Oral QHS   metoprolol tartrate  100 mg Oral BID   Continuous Infusions:  ferric gluconate (FERRLECIT) IVPB 250 mg (06/29/22 0914)   niCARDipine Stopped (06/29/22 0915)    LOS: 5 days   Time spent: 58 mins    Adrien Dietzman Wynetta Emery, MD How to contact the [facility] Attending or Consulting provider [place] or covering provider during after hours [place], for this patient?  Check the care team in [facility] and look for a) attending/consulting TRH provider listed and b) the [facility] team listed Log into [email or web address] and use [place]'s universal password to access. If you do not have the password, please contact the hospital operator. Locate the [facility] provider you are looking for under [facility] and page to a number that you can be directly reached. If you still have difficulty reaching the provider, please page the [facility] (Director on Call) for the Hospitalists listed on amion for assistance.  06/29/2022, 12:36 PM

## 2022-06-29 NOTE — Progress Notes (Signed)
NEPHROLOGY NURSING NOTE:  2 hour UF-only treatment for volume unloading completed using RIJ TDC.  Goal met: 3 liters removed.  All blood was returned.  Nicardipine infusion continues at 4mg /h.  For regular HD treatment tomorrow.  Will wait until pt is seen by Dr. Marval Regal before dialyzing.  POST-DIALYSIS:  06/29/22 2215  Vitals  BP (!) 153/72  MAP (mmHg) 99  BP Location Right Arm  BP Method Automatic  Patient Position (if appropriate) Lying  Pulse Rate 71  Pulse Rate Source Monitor  ECG Heart Rate 73  Resp 18  Oxygen Therapy  SpO2 97 %  O2 Device HFNC  O2 Flow Rate (L/min) 12 L/min  Patient Activity (if Appropriate) In bed  Post Treatment  Dialyzer Clearance Lightly streaked  Duration of HD Treatment -hour(s) 2 hour(s)  Hemodialysis Intake (mL) 0 mL  Liters Processed 36  Fluid Removed (mL) 3000 mL  Tolerated HD Treatment Yes  Post-Hemodialysis Comments Goal met   Report given to Morton Peters, RN   Rockwell Alexandria, RN AP KDU

## 2022-06-29 NOTE — Progress Notes (Signed)
  Nephrology Nursing Note:  Was asked to evaluate patient for increased dyspnea and oxygen requirement since this morning.  Pt is sitting on side of the bed ("it's easier to breathe like this").  Bibasilar rales on 8L O2 HFNC.  He is agreeable to extra UF treatment today, feels he can wait until this evening as we have one more patient to dialyze first.  Discussed with Dr. Moshe Cipro; orders received.  Rockwell Alexandria, RN AP KDU

## 2022-06-29 NOTE — Consult Note (Signed)
V[facility] and Vein Specialist of [place]  Patient name: Matthew Robles MRN: [id number] DOB: [birth year] Sex: male   HPI: Matthew Robles is a 67 y.o. male admitted with worsening respiratory issues.  Found to have progression of his chronic renal insufficiency now to end-stage renal disease.  Had tunnel hemodialysis catheter placed and has had 3 sessions without difficulty from this.  I am seeing him today for discussion of permanent access.  He is right-handed.  He does not have a pacemaker.  Past Medical History:  Diagnosis Date   CHF (congestive heart failure) ([place])    Diabetes mellitus without complication ([place])    Hypertension    Stroke ([place])    2017    Family History  Problem Relation Age of Onset   Diabetes Mother    Hypertension Mother    Diabetes Maternal Grandfather     SOCIAL HISTORY: Social History   Tobacco Use   Smoking status: Former    Packs/day: 1.00    Types: Cigarettes    Quit date: 09/15/2017    Years since quitting: 4.7   Smokeless tobacco: Never  Substance Use Topics   Alcohol use: Not Currently    Alcohol/week: 4.0 standard drinks of alcohol    Types: 4 Glasses of wine per week    Comment: month    No Known Allergies  Current Facility-Administered Medications  Medication Dose Route Frequency Provider Last Rate Last Admin   0.9 %  sodium chloride infusion (Manually program via Guardrails IV Fluids)   Intravenous Once Johnson, Clanford L, MD       acetaminophen (TYLENOL) tablet 650 mg  650 mg Oral Q6H PRN Virl Cagey, MD   650 mg at 06/26/22 2134   Or   acetaminophen (TYLENOL) suppository 650 mg  650 mg Rectal Q6H PRN Virl Cagey, MD       albuterol (PROVENTIL) (2.5 MG/3ML) 0.083% nebulizer solution 2.5 mg  2.5 mg Nebulization Q6H PRN Johnson, Clanford L, MD   2.5 mg at 06/29/22 1421   alteplase (CATHFLO ACTIVASE) injection 2 mg  2 mg Intracatheter Once PRN Claudia Desanctis, MD       amLODipine  (NORVASC) tablet 10 mg  10 mg Oral Daily Corliss Parish, MD   10 mg at 06/29/22 [id number]   bisacodyl (DULCOLAX) EC tablet 5 mg  5 mg Oral Daily PRN Virl Cagey, MD       calcitRIOL (ROCALTROL) capsule 0.5 mcg  0.5 mcg Oral Daily Corliss Parish, MD   0.5 mcg at 06/29/22 [id number]   Chlorhexidine Gluconate Cloth 2 % PADS 6 each  6 each Topical Daily Virl Cagey, MD   6 each at 06/26/22 0953   Chlorhexidine Gluconate Cloth 2 % PADS 6 each  6 each Topical Q0600 Virl Cagey, MD   6 each at 06/29/22 0559   Darbepoetin Alfa (ARANESP) injection 200 mcg  200 mcg Subcutaneous Q Mon-1800 Corliss Parish, MD   200 mcg at 06/28/22 1900   fentaNYL (SUBLIMAZE) injection 12.5 mcg  12.5 mcg Intravenous Q2H PRN Virl Cagey, MD       ferric gluconate (FERRLECIT) 250 mg in sodium chloride 0.9 % 250 mL IVPB  250 mg Intravenous Daily Corliss Parish, MD 135 mL/hr at 06/29/22 0914 250 mg at 06/29/22 0914   furosemide (LASIX) injection 80 mg  80 mg Intravenous Daily Virl Cagey, MD   80 mg at 06/29/22 0906   heparin injection 1,000 Units  1,000 Units Intracatheter  PRN Claudia Desanctis, MD   3,800 Units at 06/26/22 2115   heparin injection 5,000 Units  5,000 Units Subcutaneous Q8H Virl Cagey, MD   5,000 Units at 06/29/22 1338   hydrALAZINE (APRESOLINE) injection 10 mg  10 mg Intravenous Q4H PRN Virl Cagey, MD   10 mg at 06/27/22 1928   insulin aspart (novoLOG) injection 0-6 Units  0-6 Units Subcutaneous TID WC Virl Cagey, MD   1 Units at 06/28/22 1837   insulin aspart (novoLOG) injection 2 Units  2 Units Subcutaneous TID WC Virl Cagey, MD   2 Units at 06/28/22 1837   labetalol (NORMODYNE) injection 15 mg  15 mg Intravenous Q2H PRN Virl Cagey, MD   15 mg at 06/28/22 1358   losartan (COZAAR) tablet 100 mg  100 mg Oral QHS Corliss Parish, MD   100 mg at 06/28/22 2147   metoprolol tartrate (LOPRESSOR) tablet 100 mg  100 mg Oral BID  Wynetta Emery, Clanford L, MD   100 mg at 06/29/22 0906   nicardipine (CARDENE) 81m in 0.86% saline 2029mIV infusion (0.1 mg/ml)  3-15 mg/hr Intravenous Continuous JoWynetta EmeryClanford L, MD   Stopped at 06/29/22 0915   ondansetron (ZOFRAN) tablet 4 mg  4 mg Oral Q6H PRN BrVirl CageyMD       Or   ondansetron ([facility] Hospitalinjection 4 mg  4 mg Intravenous Q6H PRN BrVirl CageyMD   4 mg at 06/27/22 2255   oxyCODONE (Oxy IR/ROXICODONE) immediate release tablet 2.5 mg  2.5 mg Oral Q6H PRN BrVirl CageyMD       traZODone (DESYREL) tablet 25 mg  25 mg Oral QHS PRN BrVirl CageyMD        REVIEW OF SYSTEMS:  [X]$  denotes positive finding, [ ]$  denotes negative finding Cardiac  Comments:  Chest pain or chest pressure:    Shortness of breath upon exertion: x   Short of breath when lying flat: x   Irregular heart rhythm:        Vascular    Pain in calf, thigh, or hip brought on by ambulation:    Pain in feet at night that wakes you up from your sleep:     Blood clot in your veins:    Leg swelling:  x         PHYSICAL EXAM: Vitals:   06/29/22 1300 06/29/22 1400 06/29/22 1421 06/29/22 1430  BP: (!) 179/92 (!) 187/93    Pulse: 81 81 77 83  Resp: (!) 22 15 19 20  $ Temp:      TempSrc:      SpO2:  95% 95% 95%  Weight:      Height:        GENERAL: The patient is a well-nourished male, in no acute distress. The vital signs are documented above. CARDIOVASCULAR: Left radial pulses bilaterally.  Small surface veins bilaterally. PULMONARY: There is good air exchange  MUSCULOSKELETAL: There are no major deformities or cyanosis. NEUROLOGIC: No focal weakness or paresthesias are detected. SKIN: There are no ulcers or rashes noted. PSYCHIATRIC: The patient has a normal affect.  DATA:  None  MEDICAL ISSUES: I had a lengthy discussion with the patient regarding options for hemodialysis.  I explained the limitation with potential infection risk of long-term use of tunneled catheter.   I did discuss AV fistula and AV graft placement.  We would plan access on his left arm which is nondominant.  He does  not appear to have large surface veins bilaterally.  I explained that we would make the determination at the time of surgery as to whether to proceed with AV fistula versus AV graft.  I spoke to the advantages and disadvantages of each of these methods.  Explained that this would be done as an outpatient at [facility] in several weeks after he is established with outpatient dialysis.  My office will continue to follow and plan outpatient surgery in 2 or 3 weeks.  I will not follow actively while he is in the hospital.  Please let me know if there are questions    [name], MD [place] [facility] [phone]  Note: Portions of this report may have been transcribed using voice recognition software.  Every effort has been made to ensure accuracy; however, inadvertent computerized transcription errors may still be present.

## 2022-06-29 NOTE — H&P (View-Only) (Signed)
[facility]ist [facility] [place]  Patient name: Matthew Robles MRN: [id number] DOB: [birth year] Sex: male   HPI: Matthew Robles is a 67 y.o. male admitted with worsening respiratory issues.  Found to have progression of his chronic renal insufficiency now to end-stage renal disease.  Had tunnel hemodialysis catheter placed and has had 3 sessions without difficulty from this.  I am seeing him today for discussion of permanent access.  He is right-handed.  He does not have a pacemaker.  Past Medical History:  Diagnosis Date   CHF (congestive heart failure) ([place])    Diabetes mellitus without complication ([place])    Hypertension    Stroke ([place])    2017    Family History  Problem Relation Age of Onset   Diabetes Mother    Hypertension Mother    Diabetes Maternal Grandfather     SOCIAL HISTORY: Social History   Tobacco Use   Smoking status: Former    Packs/day: 1.00    Types: Cigarettes    Quit date: 09/15/2017    Years since quitting: 4.7   Smokeless tobacco: Never  Substance Use Topics   Alcohol use: Not Currently    Alcohol/week: 4.0 standard drinks of alcohol    Types: 4 Glasses of wine per week    Comment: month    No Known Allergies  Current Facility-Administered Medications  Medication Dose Route Frequency Provider Last Rate Last Admin   0.9 %  sodium chloride infusion (Manually program via Guardrails IV Fluids)   Intravenous Once Johnson, Clanford L, MD       acetaminophen (TYLENOL) tablet 650 mg  650 mg Oral Q6H PRN Virl Cagey, MD   650 mg at 06/26/22 2134   Or   acetaminophen (TYLENOL) suppository 650 mg  650 mg Rectal Q6H PRN Virl Cagey, MD       albuterol (PROVENTIL) (2.5 MG/3ML) 0.083% nebulizer solution 2.5 mg  2.5 mg Nebulization Q6H PRN Johnson, Clanford L, MD   2.5 mg at 06/29/22 1421   alteplase (CATHFLO ACTIVASE) injection 2 mg  2 mg Intracatheter Once PRN Claudia Desanctis, MD       amLODipine  (NORVASC) tablet 10 mg  10 mg Oral Daily Corliss Parish, MD   10 mg at 06/29/22 [id number]   bisacodyl (DULCOLAX) EC tablet 5 mg  5 mg Oral Daily PRN Virl Cagey, MD       calcitRIOL (ROCALTROL) capsule 0.5 mcg  0.5 mcg Oral Daily Corliss Parish, MD   0.5 mcg at 06/29/22 [id number]   Chlorhexidine Gluconate Cloth 2 % PADS 6 each  6 each Topical Daily Virl Cagey, MD   6 each at 06/26/22 0953   Chlorhexidine Gluconate Cloth 2 % PADS 6 each  6 each Topical Q0600 Virl Cagey, MD   6 each at 06/29/22 0559   Darbepoetin Alfa (ARANESP) injection 200 mcg  200 mcg Subcutaneous Q Mon-1800 Corliss Parish, MD   200 mcg at 06/28/22 1900   fentaNYL (SUBLIMAZE) injection 12.5 mcg  12.5 mcg Intravenous Q2H PRN Virl Cagey, MD       ferric gluconate (FERRLECIT) 250 mg in sodium chloride 0.9 % 250 mL IVPB  250 mg Intravenous Daily Corliss Parish, MD 135 mL/hr at 06/29/22 0914 250 mg at 06/29/22 0914   furosemide (LASIX) injection 80 mg  80 mg Intravenous Daily Virl Cagey, MD   80 mg at 06/29/22 0906   heparin injection 1,000 Units  1,000 Units Intracatheter  PRN Claudia Desanctis, MD   3,800 Units at 06/26/22 2115   heparin injection 5,000 Units  5,000 Units Subcutaneous Q8H Virl Cagey, MD   5,000 Units at 06/29/22 1338   hydrALAZINE (APRESOLINE) injection 10 mg  10 mg Intravenous Q4H PRN Virl Cagey, MD   10 mg at 06/27/22 1928   insulin aspart (novoLOG) injection 0-6 Units  0-6 Units Subcutaneous TID WC Virl Cagey, MD   1 Units at 06/28/22 1837   insulin aspart (novoLOG) injection 2 Units  2 Units Subcutaneous TID WC Virl Cagey, MD   2 Units at 06/28/22 1837   labetalol (NORMODYNE) injection 15 mg  15 mg Intravenous Q2H PRN Virl Cagey, MD   15 mg at 06/28/22 1358   losartan (COZAAR) tablet 100 mg  100 mg Oral QHS Corliss Parish, MD   100 mg at 06/28/22 2147   metoprolol tartrate (LOPRESSOR) tablet 100 mg  100 mg Oral BID  Wynetta Emery, Clanford L, MD   100 mg at 06/29/22 0906   nicardipine (CARDENE) 81m in 0.86% saline 2029mIV infusion (0.1 mg/ml)  3-15 mg/hr Intravenous Continuous JoWynetta EmeryClanford L, MD   Stopped at 06/29/22 0915   ondansetron (ZOFRAN) tablet 4 mg  4 mg Oral Q6H PRN BrVirl CageyMD       Or   ondansetron ([facility] 4 mg  4 mg Intravenous Q6H PRN BrVirl CageyMD   4 mg at 06/27/22 2255   oxyCODONE (Oxy IR/ROXICODONE) immediate release tablet 2.5 mg  2.5 mg Oral Q6H PRN BrVirl CageyMD       traZODone (DESYREL) tablet 25 mg  25 mg Oral QHS PRN BrVirl CageyMD        REVIEW OF SYSTEMS:  [X]$  denotes positive finding, [ ]$  denotes negative finding Cardiac  Comments:  Chest pain or chest pressure:    Shortness of breath upon exertion: x   Short of breath when lying flat: x   Irregular heart rhythm:        Vascular    Pain in calf, thigh, or hip brought on by ambulation:    Pain in feet at night that wakes you up from your sleep:     Blood clot in your veins:    Leg swelling:  x         PHYSICAL EXAM: Vitals:   06/29/22 1300 06/29/22 1400 06/29/22 1421 06/29/22 1430  BP: (!) 179/92 (!) 187/93    Pulse: 81 81 77 83  Resp: (!) 22 15 19 20  $ Temp:      TempSrc:      SpO2:  95% 95% 95%  Weight:      Height:        GENERAL: The patient is a well-nourished male, in no acute distress. The vital signs are documented above. CARDIOVASCULAR: Left radial pulses bilaterally.  Small surface veins bilaterally. PULMONARY: There is good air exchange  MUSCULOSKELETAL: There are no major deformities or cyanosis. NEUROLOGIC: No focal weakness or paresthesias are detected. SKIN: There are no ulcers or rashes noted. PSYCHIATRIC: The patient has a normal affect.  DATA:  None  MEDICAL ISSUES: I had a lengthy discussion with the patient regarding options for hemodialysis.  I explained the limitation with potential infection risk of long-term use of tunneled catheter.   I did discuss AV fistula and AV graft placement.  We would plan access on his left arm which is nondominant.  He does  not appear to have large surface veins bilaterally.  I explained that we would make the determination at the time of surgery as to whether to proceed with AV fistula versus AV graft.  I spoke to the advantages and disadvantages of each of these methods.  Explained that this would be done as an outpatient at [facility] in several weeks after he is established with outpatient dialysis.  My office will continue to follow and plan outpatient surgery in 2 or 3 weeks.  I will not follow actively while he is in the hospital.  Please let me know if there are questions    [name], MD [place] [phone]  Note: Portions of this report may have been transcribed using voice recognition software.  Every effort has been made to ensure accuracy; however, inadvertent computerized transcription errors may still be present.

## 2022-06-29 NOTE — TOC Progression Note (Signed)
Transition of Care [facility]) - Progression Note    Patient Details  Name: Matthew Robles MRN: [id number] Date of Birth: 1956/04/29  Transition of Care [facility]) CM/SW [place], [place] Phone Number: 06/29/2022, 11:58 AM  Clinical Narrative:    TOC working on clip process with [facility]. [facility] requested form be completed by pt and faxed back to this. CMA spoke with pt to get from completed and faxed in. CSW spoke to financial counselor who states that med assist has spoken with pt and his screening with them is under review. TOC to continue following.     Barriers to Discharge: Continued Medical Work up  Expected Discharge Plan and Services                                               Social Determinants of Health (SDOH) Interventions SDOH Screenings   Food Insecurity: No Food Insecurity (06/24/2022)  Housing: Low Risk  (06/24/2022)  Transportation Needs: No Transportation Needs (06/24/2022)  Utilities: Not At Risk (06/24/2022)  Tobacco Use: Medium Risk (06/25/2022)    Readmission Risk Interventions     No data to display

## 2022-06-30 DIAGNOSIS — I5021 Acute systolic (congestive) heart failure: Secondary | ICD-10-CM | POA: Insufficient documentation

## 2022-06-30 LAB — GLUCOSE, CAPILLARY
Glucose-Capillary: 76 mg/dL (ref 70–99)
Glucose-Capillary: 79 mg/dL (ref 70–99)
Glucose-Capillary: 110 mg/dL — ABNORMAL HIGH (ref 70–99)
Glucose-Capillary: 100 mg/dL — ABNORMAL HIGH (ref 70–99)
Glucose-Capillary: 195 mg/dL — ABNORMAL HIGH (ref 70–99)

## 2022-06-30 LAB — RENAL FUNCTION PANEL
Albumin: 1.8 g/dL — ABNORMAL LOW (ref 3.5–5.0)
Anion gap: 13 (ref 5–15)
BUN: 41 mg/dL — ABNORMAL HIGH (ref 8–23)
CO2: 23 mmol/L (ref 22–32)
Calcium: 6.7 mg/dL — ABNORMAL LOW (ref 8.9–10.3)
Chloride: 95 mmol/L — ABNORMAL LOW (ref 98–111)
Creatinine, Ser: 7.8 mg/dL — ABNORMAL HIGH (ref 0.61–1.24)
GFR, Estimated: 7 mL/min — ABNORMAL LOW (ref >60)
Glucose, Bld: 82 mg/dL (ref 70–99)
Phosphorus: 5.8 mg/dL — ABNORMAL HIGH (ref 2.5–4.6)
Potassium: 3.2 mmol/L — ABNORMAL LOW (ref 3.5–5.1)
Sodium: 131 mmol/L — ABNORMAL LOW (ref 135–145)

## 2022-06-30 LAB — CBC
HCT: 26 % — ABNORMAL LOW (ref 39.0–52.0)
Hemoglobin: 8.5 g/dL — ABNORMAL LOW (ref 13.0–17.0)
MCH: 30 pg (ref 26.0–34.0)
MCHC: 32.7 g/dL (ref 30.0–36.0)
MCV: 91.9 fL (ref 80.0–100.0)
Platelets: 236 10*3/uL (ref 150–400)
RBC: 2.83 MIL/uL — ABNORMAL LOW (ref 4.22–5.81)
RDW: 14.5 % (ref 11.5–15.5)
WBC: 11.1 10*3/uL — ABNORMAL HIGH (ref 4.0–10.5)
nRBC: 0 % (ref 0.0–0.2)

## 2022-06-30 LAB — MAGNESIUM: Magnesium: 2 mg/dL (ref 1.7–2.4)

## 2022-06-30 MED ORDER — HEPARIN SODIUM (PORCINE) 1000 UNIT/ML IJ SOLN
2000.0000 [IU] | INTRAMUSCULAR | Status: AC
  Administered 2022-06-30: 2000 [IU] via INTRAVENOUS

## 2022-06-30 MED ORDER — CARVEDILOL 12.5 MG PO TABS
25.0000 mg | ORAL_TABLET | Freq: Two times a day (BID) | ORAL | Status: DC
  Administered 2022-06-30 – 2022-07-01 (×3): 25 mg via ORAL
  Filled 2022-06-30 (×4): qty 2

## 2022-06-30 NOTE — Progress Notes (Signed)
Patient ID: Leeam Cedrone, male   DOB: [birth year], 67 y.o.   MRN: [id number] S: Feels well, no complaints. O:BP (!) 162/76   Pulse 78   Temp 99.2 F (37.3 C) (Axillary)   Resp (!) 24   Ht 6' (1.829 m)   Wt 86.1 kg   SpO2 99%   BMI 25.74 kg/m   Intake/Output Summary (Last 24 hours) at 06/30/2022 1037 Last data filed at 06/30/2022 0422 Gross per 24 hour  Intake 599.78 ml  Output 3625 ml  Net -3025.22 ml   Intake/Output: I/O last 3 completed shifts: In: 1964.7 [P.O.:480; I.V.:1223.6; IV Piggyback:261.1] Out: 4098 [Urine:950; Other:3000]  Intake/Output this shift:  No intake/output data recorded. Weight change: -3 kg Gen: NAD CVS: RRR Resp:CTA Abd: +BS, soft, NT/ND Ext:1+ pretibial edema  Recent Labs  Lab 06/24/22 0532 06/24/22 1136 06/25/22 0427 06/26/22 0411 06/27/22 0443 06/28/22 0419 06/29/22 0438 06/30/22 0346  NA 133*  --  128* 133* 133* 131* 131* 131*  K 3.2*  --  3.2* 3.3* 3.2* 3.6 3.3* 3.2*  CL 103  --  104 102 100 99 98 95*  CO2 15*  --  15* 18* 23 21* 25 23  GLUCOSE 228*  --  102* 108* 103* 91 109* 82  BUN 76*  --  78* 60* 41* 51* 35* 41*  CREATININE 12.15* 12.27* 12.34* 10.26* 8.00* 9.26* 6.81* 7.80*  ALBUMIN 2.0*  --  1.8* 1.8* 1.7* 1.7* 1.7* 1.8*  CALCIUM 6.7*  --  6.3* 6.7* 6.5* 6.3* 6.4* 6.7*  PHOS  --   --  7.8* 6.9* 5.0* 6.2* 4.7* 5.8*  AST 25  --   --   --   --   --   --   --   ALT 13  --   --   --   --   --   --   --    Liver Function Tests: Recent Labs  Lab 06/24/22 0532 06/25/22 0427 06/28/22 0419 06/29/22 0438 06/30/22 0346  AST 25  --   --   --   --   ALT 13  --   --   --   --   ALKPHOS 70  --   --   --   --   BILITOT 0.7  --   --   --   --   PROT 6.4*  --   --   --   --   ALBUMIN 2.0*   < > 1.7* 1.7* 1.8*   < > = values in this interval not displayed.   No results for input(s): "LIPASE", "AMYLASE" in the last 168 hours. No results for input(s): "AMMONIA" in the last 168 hours. CBC: Recent Labs  Lab 06/24/22 0532  06/24/22 1136 06/25/22 0427 06/26/22 0411 06/27/22 0443 06/28/22 0419 06/29/22 0438 06/30/22 0346  WBC 13.4*   < > 10.9* 8.8 9.1 10.2 10.3 11.1*  NEUTROABS 7.1  --  7.1 5.8  --   --   --   --   HGB 8.4*   < > 7.4* 6.9* 8.0* 7.8* 8.1* 8.5*  HCT 27.1*   < > 22.7* 21.3* 24.6* 24.2* 25.1* 26.0*  MCV 97.8   < > 93.0 92.2 92.1 93.4 92.6 91.9  PLT 298   < > 198 163 178 153 218 236   < > = values in this interval not displayed.   Cardiac Enzymes: No results for input(s): "CKTOTAL", "CKMB", "CKMBINDEX", "TROPONINI" in the last 168 hours. CBG: Recent Labs  Lab 06/29/22 1107 06/29/22 1620 06/29/22 2153 06/30/22 0345 06/30/22 0754  GLUCAP 125* 123* 92 76 79    Iron Studies: No results for input(s): "IRON", "TIBC", "TRANSFERRIN", "FERRITIN" in the last 72 hours. Studies/Results: No results found.  sodium chloride   Intravenous Once   amLODipine  10 mg Oral Daily   calcitRIOL  0.5 mcg Oral Daily   Chlorhexidine Gluconate Cloth  6 each Topical Daily   Chlorhexidine Gluconate Cloth  6 each Topical Q0600   darbepoetin (ARANESP) injection - DIALYSIS  200 mcg Subcutaneous Q Mon-1800   furosemide  80 mg Intravenous Daily   heparin  5,000 Units Subcutaneous Q8H   insulin aspart  0-6 Units Subcutaneous TID WC   insulin aspart  2 Units Subcutaneous TID WC   losartan  100 mg Oral QHS   metoprolol tartrate  100 mg Oral BID    BMET    Component Value Date/Time   NA 131 (L) 06/30/2022 0346   K 3.2 (L) 06/30/2022 0346   CL 95 (L) 06/30/2022 0346   CO2 23 06/30/2022 0346   GLUCOSE 82 06/30/2022 0346   BUN 41 (H) 06/30/2022 0346   CREATININE 7.80 (H) 06/30/2022 0346   CALCIUM 6.7 (L) 06/30/2022 0346   GFRNONAA 7 (L) 06/30/2022 0346   GFRAA 35 (L) 10/31/2018 1457   CBC    Component Value Date/Time   WBC 11.1 (H) 06/30/2022 0346   RBC 2.83 (L) 06/30/2022 0346   HGB 8.5 (L) 06/30/2022 0346   HCT 26.0 (L) 06/30/2022 0346   PLT 236 06/30/2022 0346   MCV 91.9 06/30/2022 0346   MCH  30.0 06/30/2022 0346   MCHC 32.7 06/30/2022 0346   RDW 14.5 06/30/2022 0346   LYMPHSABS 0.5 (L) 06/26/2022 0411   MONOABS 0.8 06/26/2022 0411   EOSABS 1.5 (H) 06/26/2022 0411   BASOSABS 0.1 06/26/2022 0411     Assessment/Plan:  New ESRD - had progressive CKD and now dialysis dependent.  S/p [facility] 06/25/22 and first HD on 2/2, 2nd on 2.3, third on 2/5, and for 4th treatment today.  Tolerating dialysis well.  Awaiting outpatient HD arrangements but leaning towards [facility] HTN emergency - improved with nicardipine gtt and UF with HD.  Continue to UF today with HD as above. Acute hypoxic respiratory failure - due to volume overload.  Covid, flud, and RSV negative.  Improved with UF. CKD-MBD - elevated iPTH at 418, low calcium at 6.8, phos 5.8.  Will start binders and continue with vitamin D. Anemia of CKD stage V - on ESA and iron, s/p PRBC's. Hyponatremia - impaired free water excretion and will follow with HD. Hypokalemia - improving.  Will use 4K bath with HD. Vascular access - has RIJ TDC placed 06/25/22 by Dr. Constance Haw.  Seen by Dr. Donnetta Hutching and will arrange outpatient vein mapping and possible left arm AVF. Disposition - still requiring nicardipine gtt and will need outpatient HD to be arranged.  Donetta Potts, MD [facility]

## 2022-06-30 NOTE — Progress Notes (Signed)
PROGRESS NOTE  Matthew Robles ENI:[id number] DOB: Sep 15, 1955 DOA: 06/24/2022 PCP: Pcp, No  Brief History:  67 year old gentleman with longstanding hypertension since age 59, stage IV CKD, type 2 diabetes mellitus with neuropathy, gout, sleep disorder, history of stroke, congestive heart failure, anemia, chronic edema who established nephrology care with Dr. Trinda Pascal on 06/16/2021.  He apparently was lost to follow up.   He reported that over the past 2 weeks has had progressive symptoms of increasing edema in the lower extremities and poor urine output.  He reports that he noticed less than 2 ounces of urine in the past couple of days.  He is also had a cough and increasing shortness of breath that has worsened over the past 3 days.  He denies chest pain and palpitations.  He has noticed significant increase in the edema in the legs.  It is affecting his ambulation.  He also noticed increased abdominal distention from edema.  He reports that he has been taking his medications for blood pressure.  He reports that he takes amlodipine and hydralazine.  He does have a history of poor compliance with medications.     Patient presented to the emergency department with acute respiratory distress that woke him up this morning.  His pulse ox was 60%.  EMS had him on CPAP.  The ED placed him on BiPAP with improvement in symptoms.  His chest x-ray showed pulmonary edema and bilateral pleural effusions.  His BNP was markedly elevated.  He appears clinically volume overloaded.  He was given IV Lasix with no significant urine output.  His creatinine was elevated at 12.  His blood pressure was markedly elevated and he was placed on an IV nitroglycerin infusion.  Nephrology was consulted.  He is being admitted for management of hypertensive emergency.   Assessment/Plan:  Hypertensive emergency  - pt presented with markedly elevated BP and significant renal failure - now is off IV nitroglycerin  (ineffective); started IV nicardipine on 2/5   - HD treatments for volume removal  - continue to wean off nicardipine - nephrology team started losartan, continue metoprolol, stopped hydralazine, started amlodipine, goal is to wean off IV nicardipine infusion   AKI on CKD stage IV--->now ESRD  - records indicate he was stage IV CKD 1 year ago - he is having poor urinary output at this time - foley catheter discontinued  - appreciate nephrology consultation - nephrology started HD 2/2 --TDC placed 2/2 - Daily renal function panel ordered - HD on 2/7  Acute HFmrEF -06/24/22 echo--EF 45-50%, mild decrease RVF, G2DD, global HK -02/22/18 Echo EF 50-55%, no WMA, G2DD -transition metoprolol to carvedilol -consult cardiology -continue losartan   Non Anion Gap Metabolic Acidosis - resolved  - secondary to acute renal failure  - HD treatments started and acidosis has resolved   - nephrology team consulting   Acute respiratory failure with hypoxia  - secondary to pulmonary edema, Acute HFpEF - pt responded well to bipap therapy - he is now off bipap therapy but will leave for PRN treatment and QHS treatment - volume removal is the treatment, nephrology team planning HD on 2/2 - transfer to telemetry  - RSV, Covid, Flu testing negative    Type 2 Diabetes Mellitus with nephropathy -06/24/22 A1C--5.9 -continue novolog sliding scale -CBGs controlled   Leukocytosis - resolved - likley stress demargination - resolved    Hypokalemia - correcting on HD - following daily renal function panel -  improved   Bilateral Pleural Effusion  - secondary to volume overload  - treating with diuresis vs hemodialysis - hemodialysis started 06/25/22 per nephrology - remains fluid overloaded but much improved  - CXR showing improvement in pulmonary edema     Anemia in CKD  - Hg down to 6.7 - transfused 1 unit PRBC on 06/24/22 - transfuse 1 unit PRBC with HD on 06/26/22 - follow up anemia panel - Hg  improved to 8 after 2nd unit given on 2/3 - Hgb remains stable     Family Communication:  no Family at bedside  Consultants:  renal, cardiology  Code Status:  FULL  DVT Prophylaxis:  [place] Heparin   Procedures: As Listed in Progress Note Above  Antibiotics: None     Subjective: He is breathing better.  Denies f/c, cp, sob, n/v/d, abd pain  Objective: Vitals:   06/30/22 0715 06/30/22 0800 06/30/22 0917 06/30/22 1134  BP:   (!) 162/76   Pulse:   78   Resp:      Temp:    98.1 F (36.7 C)  TempSrc:    Oral  SpO2: 98% 99%    Weight:      Height:        Intake/Output Summary (Last 24 hours) at 06/30/2022 1210 Last data filed at 06/30/2022 1151 Gross per 24 hour  Intake 1586.35 ml  Output 3625 ml  Net -2038.65 ml   Weight change: -3 kg Exam:  General:  Pt is alert, follows commands appropriately, not in acute distress HEENT: No icterus, No thrush, No neck mass, [place]/AT Cardiovascular: RRR, S1/S2, no rubs, no gallops Respiratory: bibasilar crackles. No wheeze Abdomen: Soft/+BS, non tender, non distended, no guarding Extremities: 1 + LE edema, No lymphangitis, No petechiae, No rashes, no synovitis   Data Reviewed: I have personally reviewed following labs and imaging studies Basic Metabolic Panel: Recent Labs  Lab 06/24/22 0532 06/24/22 1136 06/25/22 0427 06/26/22 0411 06/27/22 0443 06/28/22 0419 06/29/22 0438 06/30/22 0346  NA 133*  --  128* 133* 133* 131* 131* 131*  K 3.2*  --  3.2* 3.3* 3.2* 3.6 3.3* 3.2*  CL 103  --  104 102 100 99 98 95*  CO2 15*  --  15* 18* 23 21* 25 23  GLUCOSE 228*  --  102* 108* 103* 91 109* 82  BUN 76*  --  78* 60* 41* 51* 35* 41*  CREATININE 12.15*   < > 12.34* 10.26* 8.00* 9.26* 6.81* 7.80*  CALCIUM 6.7*  --  6.3* 6.7* 6.5* 6.3* 6.4* 6.7*  MG 2.5*  --  2.3 2.2  --   --   --  2.0  PHOS  --    < > 7.8* 6.9* 5.0* 6.2* 4.7* 5.8*   < > = values in this interval not displayed.   Liver Function Tests: Recent Labs  Lab  06/24/22 0532 06/25/22 0427 06/26/22 0411 06/27/22 0443 06/28/22 0419 06/29/22 0438 06/30/22 0346  AST 25  --   --   --   --   --   --   ALT 13  --   --   --   --   --   --   ALKPHOS 70  --   --   --   --   --   --   BILITOT 0.7  --   --   --   --   --   --   PROT 6.4*  --   --   --   --   --   --  ALBUMIN 2.0*   < > 1.8* 1.7* 1.7* 1.7* 1.8*   < > = values in this interval not displayed.   No results for input(s): "LIPASE", "AMYLASE" in the last 168 hours. No results for input(s): "AMMONIA" in the last 168 hours. Coagulation Profile: No results for input(s): "INR", "PROTIME" in the last 168 hours. CBC: Recent Labs  Lab 06/24/22 0532 06/24/22 1136 06/25/22 0427 06/26/22 0411 06/27/22 0443 06/28/22 0419 06/29/22 0438 06/30/22 0346  WBC 13.4*   < > 10.9* 8.8 9.1 10.2 10.3 11.1*  NEUTROABS 7.1  --  7.1 5.8  --   --   --   --   HGB 8.4*   < > 7.4* 6.9* 8.0* 7.8* 8.1* 8.5*  HCT 27.1*   < > 22.7* 21.3* 24.6* 24.2* 25.1* 26.0*  MCV 97.8   < > 93.0 92.2 92.1 93.4 92.6 91.9  PLT 298   < > 198 163 178 153 218 236   < > = values in this interval not displayed.   Cardiac Enzymes: No results for input(s): "CKTOTAL", "CKMB", "CKMBINDEX", "TROPONINI" in the last 168 hours. BNP: Invalid input(s): "POCBNP" CBG: Recent Labs  Lab 06/29/22 1620 06/29/22 2153 06/30/22 0345 06/30/22 0754 06/30/22 1130  GLUCAP 123* 92 76 79 110*   HbA1C: No results for input(s): "HGBA1C" in the last 72 hours. Urine analysis:    Component Value Date/Time   COLORURINE STRAW (A) 12/25/2020 2057   APPEARANCEUR CLEAR 12/25/2020 2057   LABSPEC 1.006 12/25/2020 2057   PHURINE 8.0 12/25/2020 2057   GLUCOSEU 50 (A) 12/25/2020 2057   HGBUR SMALL (A) 12/25/2020 2057   BILIRUBINUR NEGATIVE 12/25/2020 2057   KETONESUR NEGATIVE 12/25/2020 2057   PROTEINUR 30 (A) 12/25/2020 2057   NITRITE NEGATIVE 12/25/2020 2057   LEUKOCYTESUR NEGATIVE 12/25/2020 2057   Sepsis  Labs: @LABRCNTIP (procalcitonin:4,lacticidven:4) ) Recent Results (from the past 240 hour(s))  Resp panel by RT-PCR (RSV, Flu A&B, Covid) Anterior Nasal Swab     Status: None   Collection Time: 06/24/22 10:12 AM   Specimen: Anterior Nasal Swab  Result Value Ref Range Status   SARS Coronavirus 2 by RT PCR NEGATIVE NEGATIVE Final    Comment: (NOTE) SARS-CoV-2 target nucleic acids are NOT DETECTED.  The SARS-CoV-2 RNA is generally detectable in upper respiratory specimens during the acute phase of infection. The lowest concentration of SARS-CoV-2 viral copies this assay can detect is 138 copies/mL. A negative result does not preclude SARS-Cov-2 infection and should not be used as the sole basis for treatment or other patient management decisions. A negative result may occur with  improper specimen collection/handling, submission of specimen other than nasopharyngeal swab, presence of viral mutation(s) within the areas targeted by this assay, and inadequate number of viral copies(<138 copies/mL). A negative result must be combined with clinical observations, patient history, and epidemiological information. The expected result is Negative.  Fact Sheet for Patients:  08/23/22  Fact Sheet for Healthcare Providers:  BloggerCourse.com  This test is no t yet approved or cleared by the [employer] FDA and  has been authorized for detection and/or diagnosis of SARS-CoV-2 by FDA under an Emergency Use Authorization (EUA). This EUA will remain  in effect (meaning this test can be used) for the duration of the COVID-19 declaration under Section 564(b)(1) of the Act, 21 U.S.C.section 360bbb-3(b)(1), unless the authorization is terminated  or revoked sooner.       Influenza A by PCR NEGATIVE NEGATIVE Final   Influenza B by PCR NEGATIVE NEGATIVE  Final    Comment: (NOTE) The Xpert Xpress SARS-CoV-2/FLU/RSV plus assay is intended as  an aid in the diagnosis of influenza from Nasopharyngeal swab specimens and should not be used as a sole basis for treatment. Nasal washings and aspirates are unacceptable for Xpert Xpress SARS-CoV-2/FLU/RSV testing.  Fact Sheet for Patients: EntrepreneurPulse.com.au  Fact Sheet for Healthcare Providers: IncredibleEmployment.be  This test is not yet approved or cleared by the [place] FDA and has been authorized for detection and/or diagnosis of SARS-CoV-2 by FDA under an Emergency Use Authorization (EUA). This EUA will remain in effect (meaning this test can be used) for the duration of the COVID-19 declaration under Section 564(b)(1) of the Act, 21 U.S.C. section 360bbb-3(b)(1), unless the authorization is terminated or revoked.     Resp Syncytial Virus by PCR NEGATIVE NEGATIVE Final    Comment: (NOTE) Fact Sheet for Patients: EntrepreneurPulse.com.au  Fact Sheet for Healthcare Providers: IncredibleEmployment.be  This test is not yet approved or cleared by the [place] FDA and has been authorized for detection and/or diagnosis of SARS-CoV-2 by FDA under an Emergency Use Authorization (EUA). This EUA will remain in effect (meaning this test can be used) for the duration of the COVID-19 declaration under Section 564(b)(1) of the Act, 21 U.S.C. section 360bbb-3(b)(1), unless the authorization is terminated or revoked.  Performed at [facility], [address] [place], [place] [address]   MRSA Next Gen by PCR, Nasal     Status: None   Collection Time: 06/24/22 10:12 AM   Specimen: Anterior Nasal Swab  Result Value Ref Range Status   MRSA by PCR Next Gen NOT DETECTED NOT DETECTED Final    Comment: (NOTE) The GeneXpert MRSA Assay (FDA approved for NASAL specimens only), is one component of a comprehensive MRSA colonization surveillance program. It is not intended to diagnose MRSA infection nor  to guide or monitor treatment for MRSA infections. Test performance is not FDA approved in patients less than 49 years old. Performed at [facility], [address] [place], [place] [address]      Scheduled Meds:  sodium chloride   Intravenous Once   amLODipine  10 mg Oral Daily   calcitRIOL  0.5 mcg Oral Daily   Chlorhexidine Gluconate Cloth  6 each Topical Daily   Chlorhexidine Gluconate Cloth  6 each Topical Q0600   darbepoetin (ARANESP) injection - DIALYSIS  200 mcg Subcutaneous Q Mon-1800   furosemide  80 mg Intravenous Daily   heparin  5,000 Units Subcutaneous Q8H   insulin aspart  0-6 Units Subcutaneous TID WC   insulin aspart  2 Units Subcutaneous TID WC   losartan  100 mg Oral QHS   metoprolol tartrate  100 mg Oral BID   Continuous Infusions:  ferric gluconate (FERRLECIT) IVPB 75 mL/hr at 06/30/22 1151   niCARDipine 10 mg/hr (06/30/22 1151)    Procedures/Studies: DG CHEST PORT 1 VIEW  Result Date: 06/28/2022 CLINICAL DATA:  Pulmonary edema EXAM: PORTABLE CHEST 1 VIEW COMPARISON:  Chest radiograph 06/25/2022 FINDINGS: Right-sided central venous catheter with the tip in the mid SVC, unchanged. Right costophrenic angle is out of the field of view. Possible small left pleural effusion. No pneumothorax. Unchanged cardiac mediastinal contours. No focal airspace opacity. There are prominent bilateral interstitial opacities, which could represent pulmonary venous congestion or mild pulmonary edema. Degenerative changes of the right AC joint. No radiographically apparent displaced rib fractures. IMPRESSION: Prominent bilateral interstitial opacities, which could represent pulmonary venous congestion or mild pulmonary edema. Unchanged small  left pleural effusion. Electronically Signed   By: Marin Roberts M.D.   On: 06/28/2022 06:54   DG Chest Port 1 View  Result Date: 06/25/2022 CLINICAL DATA:  Postoperative vascular dialysis catheter. EXAM: PORTABLE CHEST 1 VIEW COMPARISON:   06/24/2022 FINDINGS: Interval placement of a right central venous catheter with tip over the low SVC region. No pneumothorax. Mediastinal contours appear intact. Heart size and pulmonary vascularity are mildly prominent but improved since prior study. Small bilateral pleural effusions. IMPRESSION: 1. Right central venous catheter appears in appropriate radiographic position. No pneumothorax. 2. Cardiac enlargement with mild vascular congestion, improved since prior study. Small bilateral pleural effusions. Electronically Signed   By: Lucienne Capers M.D.   On: 06/25/2022 18:53   DG C-Arm 1-60 Min-No Report  Result Date: 06/25/2022 Fluoroscopy was utilized by the requesting physician.  No radiographic interpretation.   ECHOCARDIOGRAM COMPLETE  Result Date: 06/24/2022    ECHOCARDIOGRAM REPORT   Patient Name:   KYLIE GROS Date of Exam: 06/24/2022 Medical Rec #:  [id number]       Height:       72.0 in Accession #:    [id number]      Weight:       194.0 lb Date of Birth:  09-04-1955       BSA:          2.103 m Patient Age:    82 years        BP:           154/90 mmHg Patient Gender: M               HR:           86 bpm. Exam Location:  [facility] Procedure: 2D Echo, Cardiac Doppler and Color Doppler Indications:    Congestive Heart Failure I50.9  History:        Patient has no prior history of Echocardiogram examinations.                 CHF, Stroke; Risk Factors:Hypertension, Diabetes and Current                 Smoker. Acute respiratory failure with hypoxia, ARF (acute renal                 failure).  Sonographer:    Alvino Chapel RCS Referring Phys: [place]  1. Left ventricular ejection fraction, by estimation, is 45 to 50%. The left ventricle has mildly decreased function. The left ventricle demonstrates global hypokinesis. There is moderate concentric left ventricular hypertrophy. Left ventricular diastolic parameters are consistent with Grade II diastolic dysfunction  (pseudonormalization).  2. Right ventricular systolic function is mildly reduced. The right ventricular size is normal. Tricuspid regurgitation signal is inadequate for assessing PA pressure.  3. Left atrial size was severely dilated.  4. Right atrial size was moderately dilated.  5. Left pleural effusion noted.  6. The mitral valve is degenerative. Mild mitral valve regurgitation.  7. The aortic valve is tricuspid. There is mild calcification of the aortic valve. Aortic valve regurgitation is trivial. Aortic valve sclerosis/calcification is present, without any evidence of aortic stenosis.  8. The inferior vena cava is dilated in size with >50% respiratory variability, suggesting right atrial pressure of 8 mmHg. Comparison(s): Prior images unable to be directly viewed. FINDINGS  Left Ventricle: Left ventricular ejection fraction, by estimation, is 45 to 50%. The left ventricle has mildly decreased function. The left ventricle demonstrates global hypokinesis. The  left ventricular internal cavity size was normal in size. There is  moderate concentric left ventricular hypertrophy. Left ventricular diastolic parameters are consistent with Grade II diastolic dysfunction (pseudonormalization). Right Ventricle: The right ventricular size is normal. No increase in right ventricular wall thickness. Right ventricular systolic function is mildly reduced. Tricuspid regurgitation signal is inadequate for assessing PA pressure. Left Atrium: Left atrial size was severely dilated. Right Atrium: Right atrial size was moderately dilated. Pericardium: Left pleural effusion noted. There is no evidence of pericardial effusion. Mitral Valve: The mitral valve is degenerative in appearance. There is mild calcification of the mitral valve leaflet(s). Mild to moderate mitral annular calcification. Mild mitral valve regurgitation. Tricuspid Valve: The tricuspid valve is grossly normal. Tricuspid valve regurgitation is trivial. Aortic Valve:  The aortic valve is tricuspid. There is mild calcification of the aortic valve. There is mild aortic valve annular calcification. Aortic valve regurgitation is trivial. Aortic valve sclerosis/calcification is present, without any evidence of aortic stenosis. Pulmonic Valve: The pulmonic valve was grossly normal. Pulmonic valve regurgitation is trivial. Aorta: The aortic root is normal in size and structure. Venous: The inferior vena cava is dilated in size with greater than 50% respiratory variability, suggesting right atrial pressure of 8 mmHg. IAS/Shunts: No atrial level shunt detected by color flow Doppler.  LEFT VENTRICLE PLAX 2D LVIDd:         5.40 cm   Diastology LVIDs:         4.10 cm   LV e' medial:    6.42 cm/s LV PW:         1.40 cm   LV E/e' medial:  22.1 LV IVS:        1.40 cm   LV e' lateral:   8.26 cm/s LVOT diam:     2.00 cm   LV E/e' lateral: 17.2 LV SV:         55 LV SV Index:   26 LVOT Area:     3.14 cm  RIGHT VENTRICLE RV S prime:     14.50 cm/s TAPSE (M-mode): 2.4 cm LEFT ATRIUM              Index        RIGHT ATRIUM           Index LA diam:        4.60 cm  2.19 cm/m   RA Area:     28.20 cm LA Vol (A2C):   140.0 ml 66.57 ml/m  RA Volume:   100.00 ml 47.55 ml/m LA Vol (A4C):   105.0 ml 49.92 ml/m LA Biplane Vol: 122.0 ml 58.01 ml/m  AORTIC VALVE LVOT Vmax:   90.00 cm/s LVOT Vmean:  63.600 cm/s LVOT VTI:    0.174 m  AORTA Ao Root diam: 3.50 cm MITRAL VALVE MV Area (PHT): 5.66 cm     SHUNTS MV Decel Time: 134 msec     Systemic VTI:  0.17 m MV E velocity: 142.00 cm/s  Systemic Diam: 2.00 cm MV A velocity: 56.90 cm/s MV E/A ratio:  2.50 Rozann Lesches MD Electronically signed by Rozann Lesches MD Signature Date/Time: 06/24/2022/2:58:46 PM    Final    CT Renal Stone Study  Result Date: 06/24/2022 CLINICAL DATA:  Abdominal pain and flank pain with kidney stone suspected in a 67 year old male. EXAM: CT ABDOMEN AND PELVIS WITHOUT CONTRAST TECHNIQUE: Multidetector CT imaging of the abdomen and  pelvis was performed following the standard protocol without IV contrast. RADIATION DOSE REDUCTION: This exam  was performed according to the departmental dose-optimization program which includes automated exposure control, adjustment of the mA and/or kV according to patient size and/or use of iterative reconstruction technique. COMPARISON:  Renal sonogram from 2019. No recent abdominal imaging for comparison. FINDINGS: Lower chest: Moderately large RIGHT and moderate LEFT pleural effusions. Septal thickening at the lung bases. Basilar volume loss/consolidation bilaterally. Heart size is enlarged. Low-attenuation cardiac blood pool. No pericardial effusion. Heart is incompletely imaged. No chest wall abnormality. Hepatobiliary: Smooth hepatic contours. No pericholecystic stranding. No gross biliary duct distension. No visible lesion on noncontrast imaging. Small amount of sludge in the gallbladder. Pancreas: Pancreas with normal contours, no signs of inflammation or peripancreatic fluid. Spleen: Normal. Adrenals/Urinary Tract: LEFT adrenal nodule measures 25 Hounsfield units and is 12 x 11 mm. Lobular bilateral renal contours a represent fetal lobation or scarring. No nephrolithiasis. No hydronephrosis. Mild perinephric stranding. No substantial perivesical stranding. No ureteral calculi. Stomach/Bowel: Small hiatal hernia. No stranding adjacent to the stomach. No sign of small bowel obstruction. No sign of small bowel inflammation. Normal appendix. No colonic distension or inflammation. Vascular/Lymphatic: Aortic atherosclerosis. No sign of aneurysm. Smooth contour of the IVC. There is no gastrohepatic or hepatoduodenal ligament lymphadenopathy. No retroperitoneal or mesenteric lymphadenopathy. No pelvic sidewall lymphadenopathy. Calcified atherosclerotic changes are moderate is limited assessment of vascular structures due to lack of intravenous contrast. Reproductive: Unremarkable by CT. Other: Body wall edema.  More pronounced periumbilical edema than other areas over the abdomen. Flank edema is symmetric. No substantial intra-abdominal fluid. No pneumoperitoneum. Musculoskeletal: No acute or destructive bone process. Mild spinal degenerative changes. IMPRESSION: 1. Suggestion of heart failure or volume overload in this patient with cardiomegaly, with mild to moderate anasarca associated with bilateral pleural effusions and pulmonary edema. 2. Basilar airspace disease likely related to volume loss. Would also correlate with signs of infection. 3. More pronounced stranding about the umbilicus of uncertain significance, potentially related to underlying edema but given more concentrated stranding in this area would suggest correlation with any signs of infection/cellulitis. 4. No acute intra-abdominal or pelvic findings. No signs of nephrolithiasis or ureteral calculi. Normal appendix. 5. 1.2 cm left adrenal mass, probable benign adenoma. Recommend follow-up adrenal washout CT in 1 year. If stable for = 1 year, no further follow-up imaging. JACR 2017 Aug; 14(8):1038-44, JCAT 2016 Mar-Apr; 40(2):194-200, Urol J 2006 Spring; 3(2):71-4. Also consider the following. Based on current clinical literature, biochemical evaluation to exclude possible functioning adrenal nodule is suggested if not already performed. Please refer to current clinical guidelines for detailed recommendations. NEJM AE:[id number] [id number]. 6. Low-attenuation cardiac blood pool, can be seen in the setting of anemia. 7. Small hiatal hernia. 8. Aortic atherosclerosis. Aortic Atherosclerosis (ICD10-I70.0). Electronically Signed   By: Zetta Bills M.D.   On: 06/24/2022 08:28   DG Chest Portable 1 View  Result Date: 06/24/2022 CLINICAL DATA:  Evaluate for dyspnea.  History of CHF EXAM: PORTABLE CHEST 1 VIEW COMPARISON:  12/25/2020 FINDINGS: Chronic cardiomegaly. Small pleural effusions and bilateral airspace disease greater on the right. No pneumothorax.  IMPRESSION: CHF pattern. Electronically Signed   By: Jorje Guild M.D.   On: 06/24/2022 05:48    Orson Eva, DO  [facility]  If 7PM-7AM, please contact night-coverage [email or web address] Password [email or web address] 06/30/2022, 12:10 PM   LOS: 6 days

## 2022-06-30 NOTE — Progress Notes (Signed)
HEMODIALYSIS TREATMENT NOTE:  Pre-HD pt still requiring O2 @ 10L HFNC.  Nicardipine still infusing - @ 10 mg/h.  Pre-HD BP 159/82  MAP 109.  States he slept well, "breathing better" after removal of 3L last night.  Low heparin tx was initiated with goal of 3-4L. Highest K dialysate was used. Catheter tolerates blood flow with stable pressures.  We were able to wean him off Nicardipine approximately 1 hour into treatment and SBPs remained 140s-150s.  NSR 70s, but frequent unifocal PVCs noted last night and today.  3.5 hour session completed.  4 liters removed.  O2 was able to be lowered to 5L.  After blood was returned with 300cc NS, BP^ to 171/90.   Above d/w Dr. Carles Collet who will modify prn anti-hypertensive parameters in effort to avoid resuming nicardipine.     Post-HD:  06/30/22 1700  Vital Signs  Temp  (deferred; pt is eating)  Pulse Rate 80  Pulse Rate Source Monitor  Resp 18  BP (!) 171/90  BP Location Left Arm  BP Method Automatic  Patient Position (if appropriate) Sitting  Oxygen Therapy  SpO2 95 %  O2 Device HFNC  O2 Flow Rate (L/min) 5 L/min  Dialysis Weight  Weight 82.3 kg  Type of Weight Post-Dialysis  Post Treatment  Dialyzer Clearance Lightly streaked  Duration of HD Treatment -hour(s) 3.5 hour(s)  Hemodialysis Intake (mL) 0 mL  Liters Processed 83.6  Fluid Removed (mL) 4000 mL  Tolerated HD Treatment Yes  Post-Hemodialysis Comments Goal met.   Report given to Wandra Arthurs, RN    Rockwell Alexandria, RN AP KDU

## 2022-07-01 ENCOUNTER — Encounter (HOSPITAL_COMMUNITY): Payer: Self-pay | Admitting: General Surgery

## 2022-07-01 DIAGNOSIS — I5020 Unspecified systolic (congestive) heart failure: Secondary | ICD-10-CM

## 2022-07-01 DIAGNOSIS — I5021 Acute systolic (congestive) heart failure: Secondary | ICD-10-CM

## 2022-07-01 DIAGNOSIS — D649 Anemia, unspecified: Secondary | ICD-10-CM

## 2022-07-01 LAB — RENAL FUNCTION PANEL
Albumin: 1.8 g/dL — ABNORMAL LOW (ref 3.5–5.0)
Anion gap: 11 (ref 5–15)
BUN: 30 mg/dL — ABNORMAL HIGH (ref 8–23)
CO2: 25 mmol/L (ref 22–32)
Calcium: 6.9 mg/dL — ABNORMAL LOW (ref 8.9–10.3)
Chloride: 97 mmol/L — ABNORMAL LOW (ref 98–111)
Creatinine, Ser: 5.43 mg/dL — ABNORMAL HIGH (ref 0.61–1.24)
GFR, Estimated: 11 mL/min — ABNORMAL LOW (ref >60)
Glucose, Bld: 107 mg/dL — ABNORMAL HIGH (ref 70–99)
Phosphorus: 3.8 mg/dL (ref 2.5–4.6)
Potassium: 3.3 mmol/L — ABNORMAL LOW (ref 3.5–5.1)
Sodium: 133 mmol/L — ABNORMAL LOW (ref 135–145)

## 2022-07-01 LAB — GLUCOSE, CAPILLARY
Glucose-Capillary: 88 mg/dL (ref 70–99)
Glucose-Capillary: 97 mg/dL (ref 70–99)
Glucose-Capillary: 167 mg/dL — ABNORMAL HIGH (ref 70–99)
Glucose-Capillary: 118 mg/dL — ABNORMAL HIGH (ref 70–99)
Glucose-Capillary: 107 mg/dL — ABNORMAL HIGH (ref 70–99)

## 2022-07-01 MED ORDER — SPIRONOLACTONE 25 MG PO TABS
25.0000 mg | ORAL_TABLET | Freq: Every day | ORAL | Status: DC
  Administered 2022-07-01 – 2022-07-04 (×4): 25 mg via ORAL
  Filled 2022-07-01 (×4): qty 1

## 2022-07-01 MED ORDER — POTASSIUM CHLORIDE CRYS ER 20 MEQ PO TBCR
20.0000 meq | EXTENDED_RELEASE_TABLET | Freq: Once | ORAL | Status: AC
  Administered 2022-07-01: 20 meq via ORAL
  Filled 2022-07-01: qty 1

## 2022-07-01 MED ORDER — SACUBITRIL-VALSARTAN 97-103 MG PO TABS
1.0000 | ORAL_TABLET | Freq: Two times a day (BID) | ORAL | Status: DC
  Administered 2022-07-01 – 2022-07-05 (×9): 1 via ORAL
  Filled 2022-07-01 (×13): qty 1

## 2022-07-01 NOTE — Consult Note (Addendum)
Cardiology Consultation   Patient ID: Red Mandt MRN: [id number]; DOB: 02/24/1956  Admit date: 06/24/2022 Date of Consult: 07/01/2022  PCP:  Aviva Kluver   [facility] Providers Cardiologist:  Dina Rich, MD        Patient Profile:   Matthew Robles is a 67 y.o. male with a hx of hypertension, chronic diastolic congestive heart failure, CKD III, previous CVA who is being seen 07/01/2022 for the evaluation of chronic diastolic congestive heart failure at the request of Dr. Arbutus Leas..  History of Present Illness:   [name] is a 67 year old male with a past medical history of hypertension who arrived with hypertensive emergency, chronic diastolic congestive heart failure, and CKD III.  He was last seen in clinic 06/20/20 by Dr Wyline Mood. At that time he had not been taking his home medications. Renal ultrasound was reviewed and revealed no renal artery stenosis. He was restarted on home medication of amlodipine 10 mg daily and furosemide 40 mg daily.  He presented to the [facility] emergency department 06/24/22 in respiratory distress via EMS who placed him on cpap with oxygen sats in the 60's. He ws noted to be hypertensive and tachyphemic. He stated that he had had worsening edema over the last few weeks and was having minimal urine output.  He denied any chest pain, palpitations, dizziness, or sick contacts. He had previously been followed by Nephrology and Cardiology but had been lost to follow-up.  He has been evaluated by Nephrology and had a tunneled HD catheter placed to the right upper chest wall and has been under going hemodialysis. Was seen by vascular surgery with AV fistula discussion had with potential surgery scheduled as an outpatient on 07/13/22. He was weaned off of nicardipine drip and underwent HD on 06/30/22.  Initial vital signs: blood pressure 192/110, pulse 109, resp 22, O2 sats 95%  Pertinent labs: WBC 13.4, Hgb 8.4, Hct 27.1, sodium 133, potassium 3.2, CO2 15, blood  glucose 228, serum creatinine 12.15, BUN 76, albumin 2.0,  BNP 2945.0, Mg 2.5, Respiratory panel negative, repeat CBC revealed Hgb 6.7  Imaging: CXR revealed CHF pattern; CT for renal stones revealed heart failure pattern, bilateral pleural effusions, no acute intra-abdominal or pelvic findings, 1.2 cm adrenal mass  Medications administered in the ED: Nitro drip, furosemide 40 mg IVP  Cardiology was consulted today for HFmrEF with slight drop in LVEF 45-50% from 02/22/2018 50-55% noted on echocardiogram  Past Medical History:  Diagnosis Date   CHF (congestive heart failure) (HCC)    Diabetes mellitus without complication (HCC)    Hypertension    Stroke (HCC)    2017    Past Surgical History:  Procedure Laterality Date   WISDOM TOOTH EXTRACTION       Home Medications:  Prior to Admission medications   Medication Sig Start Date End Date Taking? Authorizing Provider  furosemide (LASIX) 20 MG tablet Take 60 mg by mouth 2 (two) times daily.   Yes [provider]  potassium chloride SA (KLOR-CON) 20 MEQ tablet Take 1 tablet (20 mEq total) by mouth daily. 12/25/20  Yes Mancel Bale, MD  amLODipine (NORVASC) 10 MG tablet Take 1 tablet by mouth once daily Patient not taking: Reported on 06/24/2022 07/28/21   Antoine Poche, MD  aspirin EC 81 MG EC tablet Take 1 tablet (81 mg total) by mouth daily. Patient not taking: Reported on 12/25/2020 02/26/18   Cleora Fleet, MD  hydrALAZINE (APRESOLINE) 100 MG tablet Take 1 tablet (100 mg  total) by mouth 3 (three) times daily. Patient not taking: Reported on 06/24/2022 12/25/20   Daleen Bo, MD  insulin glargine (LANTUS) 100 UNIT/ML injection Inject 10 Units into the skin daily. Patient not taking: Reported on 12/25/2020    [provider]  metoprolol succinate (TOPROL-XL) 25 MG 24 hr tablet Take 25 mg by mouth 2 (two) times daily. Patient not taking: Reported on 06/24/2022    [provider]    Inpatient  Medications: Scheduled Meds:  sodium chloride   Intravenous Once   amLODipine  10 mg Oral Daily   calcitRIOL  0.5 mcg Oral Daily   carvedilol  25 mg Oral BID WC   Chlorhexidine Gluconate Cloth  6 each Topical Daily   Chlorhexidine Gluconate Cloth  6 each Topical Q0600   darbepoetin (ARANESP) injection - DIALYSIS  200 mcg Subcutaneous Q Mon-1800   furosemide  80 mg Intravenous Daily   heparin  5,000 Units Subcutaneous Q8H   insulin aspart  0-6 Units Subcutaneous TID WC   insulin aspart  2 Units Subcutaneous TID WC   losartan  100 mg Oral QHS   Continuous Infusions:  ferric gluconate (FERRLECIT) IVPB Stopped (06/30/22 1256)   niCARDipine Stopped (06/30/22 1447)   PRN Meds: acetaminophen **OR** acetaminophen, albuterol, alteplase, bisacodyl, fentaNYL (SUBLIMAZE) injection, heparin, hydrALAZINE, labetalol, ondansetron **OR** ondansetron (ZOFRAN) IV, oxyCODONE, traZODone  Allergies:   No Known Allergies  Social History:   Social History   Socioeconomic History   Marital status: Single    Spouse name: Not on file   Number of children: Not on file   Years of education: Not on file   Highest education level: Not on file  Occupational History   Not on file  Tobacco Use   Smoking status: Former    Packs/day: 1.00    Types: Cigarettes    Quit date: 09/15/2017    Years since quitting: 4.7   Smokeless tobacco: Never  Vaping Use   Vaping Use: Never used  Substance and Sexual Activity   Alcohol use: Not Currently    Alcohol/week: 4.0 standard drinks of alcohol    Types: 4 Glasses of wine per week    Comment: month   Drug use: Not Currently    Types: Marijuana    Comment: occassionally   Sexual activity: Not on file  Other Topics Concern   Not on file  Social History Narrative   Not on file   Social Determinants of Health   Financial Resource Strain: Not on file  Food Insecurity: No Food Insecurity (06/24/2022)   Hunger Vital Sign    Worried About Running Out of Food in  the Last Year: Never true    Ran Out of Food in the Last Year: Never true  Transportation Needs: No Transportation Needs (06/24/2022)   PRAPARE - Hydrologist (Medical): No    Lack of Transportation (Non-Medical): No  Physical Activity: Not on file  Stress: Not on file  Social Connections: Not on file  Intimate Partner Violence: Not At Risk (06/24/2022)   Humiliation, Afraid, Rape, and Kick questionnaire    Fear of Current or Ex-Partner: No    Emotionally Abused: No    Physically Abused: No    Sexually Abused: No    Family History:    Family History  Problem Relation Age of Onset   Diabetes Mother    Hypertension Mother    Diabetes Maternal Grandfather      ROS:  Please see  the history of present illness.  Review of Systems  Constitutional:  Negative for malaise/fatigue.  Respiratory:  Positive for cough and shortness of breath.   Cardiovascular:  Positive for leg swelling.  Gastrointestinal:  Positive for abdominal pain.  Neurological:  Positive for weakness.    All other ROS reviewed and negative.     Physical Exam/Data:   Vitals:   07/01/22 0400 07/01/22 0500 07/01/22 0530 07/01/22 0546  BP: (!) 161/82  (!) 171/77 (!) 195/76  Pulse: 80  80   Resp: 17  17   Temp:      TempSrc:      SpO2: 98%  98%   Weight:  85.6 kg    Height:        Intake/Output Summary (Last 24 hours) at 07/01/2022 0849 Last data filed at 07/01/2022 0503 Gross per 24 hour  Intake 1287.36 ml  Output 4600 ml  Net -3312.64 ml      07/01/2022    5:00 AM 06/30/2022    5:00 PM 06/30/2022   12:45 PM  Last 3 Weights  Weight (lbs) 188 lb 11.4 oz 181 lb 7 oz 189 lb 13.1 oz  Weight (kg) 85.6 kg 82.3 kg 86.1 kg     Body mass index is 25.59 kg/m.  General:  Well nourished, well developed, in no acute distress. Sitting upright in bed eating breakfast. HEENT: normal Neck: no JVD Vascular: No carotid bruits; Distal pulses 2+ bilaterally Cardiac:  normal S1, S2; RRR; no murmur   Lungs:  bibasilar crackles to auscultation bilaterally, respirations are unlabored at rest on 4L of O2 via [place] Abd: soft, nontender, no hepatomegaly  Ext: 1+ pitting edema BLE Musculoskeletal:  No deformities, BUE and BLE strength normal and equal Skin: warm and dry  Neuro:  CNs 2-12 intact, no focal abnormalities noted Psych:  Normal affect   EKG:  The EKG was personally reviewed and demonstrates:  on 06/24/22 sinus tachycardia rate of 114, LVH with early repolarization, ST depression and TWI in leads V5-V6 Telemetry:  Telemetry was personally reviewed and demonstrates:  sinus rate 80's with unifocal PVC's  Relevant CV Studies: TTE 06/24/22 1. Left ventricular ejection fraction, by estimation, is 45 to 50%. The  left ventricle has mildly decreased function. The left ventricle  demonstrates global hypokinesis. There is moderate concentric left  ventricular hypertrophy. Left ventricular  diastolic parameters are consistent with Grade II diastolic dysfunction  (pseudonormalization).   2. Right ventricular systolic function is mildly reduced. The right  ventricular size is normal. Tricuspid regurgitation signal is inadequate  for assessing PA pressure.   3. Left atrial size was severely dilated.   4. Right atrial size was moderately dilated.   5. Left pleural effusion noted.   6. The mitral valve is degenerative. Mild mitral valve regurgitation.   7. The aortic valve is tricuspid. There is mild calcification of the  aortic valve. Aortic valve regurgitation is trivial. Aortic valve  sclerosis/calcification is present, without any evidence of aortic  stenosis.   8. The inferior vena cava is dilated in size with >50% respiratory  variability, suggesting right atrial pressure of 8 mmHg.   Laboratory Data:  High Sensitivity Troponin:   Recent Labs  Lab 06/24/22 0532  TROPONINIHS 76*     Chemistry Recent Labs  Lab 06/25/22 0427 06/26/22 0411 06/27/22 0443 06/29/22 0438  06/30/22 0346 07/01/22 0442  NA 128* 133*   < > 131* 131* 133*  K 3.2* 3.3*   < > 3.3* 3.2*  3.3*  CL 104 102   < > 98 95* 97*  CO2 15* 18*   < > 25 23 25   GLUCOSE 102* 108*   < > 109* 82 107*  BUN 78* 60*   < > 35* 41* 30*  CREATININE 12.34* 10.26*   < > 6.81* 7.80* 5.43*  CALCIUM 6.3* 6.7*   < > 6.4* 6.7* 6.9*  MG 2.3 2.2  --   --  2.0  --   GFRNONAA 4* 5*   < > 8* 7* 11*  ANIONGAP 9 13   < > 8 13 11    < > = values in this interval not displayed.    Recent Labs  Lab 06/29/22 0438 06/30/22 0346 07/01/22 0442  ALBUMIN 1.7* 1.8* 1.8*   Lipids No results for input(s): "CHOL", "TRIG", "HDL", "LABVLDL", "LDLCALC", "CHOLHDL" in the last 168 hours.  Hematology Recent Labs  Lab 06/28/22 0419 06/29/22 0438 06/30/22 0346  WBC 10.2 10.3 11.1*  RBC 2.59* 2.71* 2.83*  HGB 7.8* 8.1* 8.5*  HCT 24.2* 25.1* 26.0*  MCV 93.4 92.6 91.9  MCH 30.1 29.9 30.0  MCHC 32.2 32.3 32.7  RDW 15.3 14.8 14.5  PLT 153 218 236   Thyroid  Recent Labs  Lab 06/24/22 1136  TSH 3.169    BNP Recent Labs  Lab 06/25/22 0427  BNP 2,536.0*    DDimer No results for input(s): "DDIMER" in the last 168 hours.   Radiology/Studies:  DG CHEST PORT 1 VIEW  Result Date: 06/28/2022 CLINICAL DATA:  Pulmonary edema EXAM: PORTABLE CHEST 1 VIEW COMPARISON:  Chest radiograph 06/25/2022 FINDINGS: Right-sided central venous catheter with the tip in the mid SVC, unchanged. Right costophrenic angle is out of the field of view. Possible small left pleural effusion. No pneumothorax. Unchanged cardiac mediastinal contours. No focal airspace opacity. There are prominent bilateral interstitial opacities, which could represent pulmonary venous congestion or mild pulmonary edema. Degenerative changes of the right AC joint. No radiographically apparent displaced rib fractures. IMPRESSION: Prominent bilateral interstitial opacities, which could represent pulmonary venous congestion or mild pulmonary edema. Unchanged small left  pleural effusion. Electronically Signed   By: Marin Roberts M.D.   On: 06/28/2022 06:54     Assessment and Plan:   HFmrEF -Echocardiogram revealed LVEF 45-50% -BNP 2536 - -3 L output in the last 24 hours -continue on carvedilol 25 mg twice daily -continue on furosemide 80 mg IV daily -continue on losartan 100 mg daily -fluid removal per Nephrology -daily weight, I&O's, and low sodium diet -heart failure education  Hypertensive emergency -blood pressure 180/86, prior to any AM medications -nicardipine drip has been weaned to off -continue current medication regimen per Nephrology recent changes -vital signs per unit protocol -previous renal ultrasound did not reveal any renal artery stenosis  AKI now on ESRD -serum creatinine 5.43 -improved from admission of 12.34 -monitor urine output -monitor/trend/replete electrolytes as needed -fluid removal per Nephrology with HD -seen by Vascular for AV fistula insertion -continue with HD per Nephrology -avoid nephrotoxic agents where able  Acute respiratory failure with hypoxia -continued on 4L O2 via [place] -continue to titrate FiO2 to keep O2 sats greater than equal to 92% -continue with as needed nebs -no longer requires cpap -imaging revealed bilateral pleural effusions -respiratory panel negative  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 -hgb stable 8.5 -transfused 1 unit of PRBC's on 06/24/22 -transfused second unit of PRBC's on 06/26/22 while on HD -daily cbc -continue iron infusions -continue to monitor for signs symptoms  of bleeding  Hypokalemia -serum potassium 3.3 -given 20 mEq of KCL -daily bmp -recommend keeping potassium <4 and >5 -monitor/trend/replete electrolytes as needed  Type II diabetes -continued on insulin -management per IM   Risk Assessment/Risk Scores:  { For questions or updates, please contact [facility] Please consult [email or web address] for contact info under    Signed, SHERI HAMMOCK, NP   07/01/2022 8:49 AM  Attending note Patient seen and discussed with NP Hammock, I agree with her documentation. 67 yo male history of HTN and prior admits with HTN emergency and poor medication compliance, chronic HFpEF, CKD IV , prior CVA, DM2, admitted 06/24/22 with SOB and leg edema. In ER bp's 190s/110s   Admit labs WBC 13.4 Hgb 8.4 K 3.2 Cr 12.15 BNP 2945 Mg 2.5  CXR CHF EKG SR, LVH chronic strain pattern Trop 76--> 06/2022 echo: LVEF 45-50%, global hypokinesis, grade II dd, mild RV dysfunction, severe LAE  1.HTN emergency - prior admits with HTN emergency, poor medication compliance - in ER bp 190s/110s, evidence of fluid overload - initially on NG drip, now on IV nicardapine  -medicaly therapy coreg 25mg  bid, norvasc 10, losartan 100, aldactone 25, nicardapine gtt 2.5  - bp's remain elevated. Changing losartan to entresto today will have more bp effect. Could use higher doses of coreg as well.   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 Would treat medically and follow LV function. No plans for ischemic testing initially - volume management per nephrology and HD  - medical therapy with coreg 25mg  bid, losartan 100, aldactone 25. SGLT2i contraindicated in HD patients. Change losartan to entresto   4.Anemia - chronic, admit Hgb 8.4. Had been 8.1 last 2 years - likely related to kidney disease   07/2022 MD

## 2022-07-01 NOTE — TOC Progression Note (Signed)
Transition of Care [facility]) - Progression Note    Patient Details  Name: Matthew Robles MRN: [id number] Date of Birth: 16-Apr-1956  Transition of [facility]) CM/SW Contact  Boneta Lucks, RN Phone Number: 07/01/2022, 12:58 PM  Clinical Narrative:   He's been accepted at the [facility]. TTS schedule at 11am beginning 07/06/22. Needs to arrive at 10:30 for first appointment. Added to AVS. Updated medical team and left message with details on Mother's phone.   Expected Discharge Plan: Home/Self Care Barriers to Discharge: Continued Medical Work up  Expected Discharge Plan and [place] with dialysis     Social Determinants of Health (SDOH) Interventions SDOH Screenings   Food Insecurity: No Food Insecurity (06/24/2022)  Housing: Low Risk  (06/24/2022)  Transportation Needs: No Transportation Needs (06/24/2022)  Utilities: Not At Risk (06/24/2022)  Tobacco Use: Medium Risk (07/01/2022)    Readmission Risk Interventions    06/29/2022   12:02 PM  Readmission Risk Prevention Plan  Transportation Screening Complete  HRI or [place] Complete  Social Work Consult for [place] Planning/Counseling Complete  Palliative Care Screening Not Applicable  Medication Review [employer]) Complete

## 2022-07-01 NOTE — TOC Progression Note (Signed)
Transition of Care [facility]) - Progression Note    Patient Details  Name: Matthew Robles MRN: [id number] Date of Birth: 02-Dec-1955  Transition of Care [facility]) CM/SW Contact  Boneta Lucks, RN Phone Number: 07/01/2022, 11:32 AM  Clinical Narrative:   CMA following up with [facility] and submitting documents requested. Still no chair time confirmed.  DC planning for Monday.    Barriers to Discharge: Continued Medical Work up

## 2022-07-01 NOTE — Progress Notes (Signed)
PROGRESS NOTE  Matthew Robles OVZ:[id number] DOB: Jun 09, 1955 DOA: 06/24/2022 PCP: Pcp, No  Brief History:  67 year old gentleman with longstanding hypertension since age 62, stage IV CKD, type 2 diabetes mellitus with neuropathy, gout, sleep disorder, history of stroke, congestive heart failure, anemia, chronic edema who established nephrology care with Dr. Trinda Pascal on 06/16/2021.  He apparently was lost to follow up.   He reported that over the past 2 weeks has had progressive symptoms of increasing edema in the lower extremities and poor urine output.  He reports that he noticed less than 2 ounces of urine in the past couple of days.  He is also had a cough and increasing shortness of breath that has worsened over the past 3 days.  He denies chest pain and palpitations.  He has noticed significant increase in the edema in the legs.  It is affecting his ambulation.  He also noticed increased abdominal distention from edema.  He reports that he has been taking his medications for blood pressure.  He reports that he takes amlodipine and hydralazine.  He does have a history of poor compliance with medications.     Patient presented to the emergency department with acute respiratory distress that woke him up this morning.  His pulse ox was 60%.  EMS had him on CPAP.  The ED placed him on BiPAP with improvement in symptoms.  His chest x-ray showed pulmonary edema and bilateral pleural effusions.  His BNP was markedly elevated.  He appears clinically volume overloaded.  He was given IV Lasix with no significant urine output.  His creatinine was elevated at 12.  His blood pressure was markedly elevated and he was placed on an IV nitroglycerin infusion.  Nephrology was consulted.  He is being admitted for management of hypertensive emergency.   Assessment/Plan: Hypertensive emergency  - pt presented with markedly elevated BP and AKI - now is off IV nitroglycerin (ineffective); started IV  nicardipine on 2/5   - HD treatments for volume removal  - weaned off nicardipine 2/7 evening - nephrology team started losartan, continue metoprolol, stopped hydralazine, started amlodipine -anticipate continued improvement of BP with starting entresto and spiro and transition to coreg   AKI on CKD stage IV--->now ESRD  - records indicate he was stage IV CKD 1 year ago - he is having poor urinary output at this time - foley catheter discontinued  - appreciate nephrology consultation - nephrology started HD 2/2 --TDC placed 2/2 - Daily renal function panel ordered - next HD on 2/9 - pt has HD chair at [facility] starting 2/13>>tentatively plan d/c after HD 2/10 if medically stable    Acute HFmrEF -06/24/22 echo--EF 45-50%, mild decrease RVF, G2DD, global HK -02/22/18 Echo EF 50-55%, no WMA, G2DD -transition metoprolol to carvedilol -consult cardiology--appreciated -Entresto and spiro started   Non Anion Gap Metabolic Acidosis - resolved  - secondary to acute renal failure  - HD treatments started and acidosis has resolved   - nephrology team consulting    Acute respiratory failure with hypoxia  - secondary to pulmonary edema, Acute HFpEF - pt responded well to bipap therapy - he is now off bipap therapy but will leave for PRN treatment and QHS treatment - HD started  - RSV, Covid, Flu testing negative    Type 2 Diabetes Mellitus with nephropathy -06/24/22 A1C--5.9 -continue novolog sliding scale -CBGs controlled   Leukocytosis - resolved - likley stress demargination - resolved  Hypokalemia - correcting on HD - following daily renal function panel - improved   Bilateral Pleural Effusion  - secondary to volume overload  - treating with diuresis vs hemodialysis - hemodialysis started 06/25/22 per nephrology - remains fluid overloaded but much improved  - CXR showing improvement in pulmonary edema     Anemia in CKD  - Hg down to 6.7 - transfused 1 unit PRBC on 06/24/22 -  transfuse 1 unit PRBC with HD on 06/26/22 - follow up anemia panel - Hg improved to 8 after 2nd unit given on 2/3 - Hgb remains stable         Family Communication:  no Family at bedside   Consultants:  renal, cardiology   Code Status:  FULL   DVT Prophylaxis:  [place] Heparin     Procedures: As Listed in Progress Note Above   Antibiotics: None         Subjective: Patient denies fevers, chills, headache, chest pain, dyspnea, nausea, vomiting, diarrhea, abdominal pain, dysuria, hematuria, hematochezia, and melena.   Objective: Vitals:   07/01/22 0546 07/01/22 0800 07/01/22 0854 07/01/22 1140  BP: (!) 195/76  (!) 180/86   Pulse:   89   Resp:      Temp:    98.4 F (36.9 C)  TempSrc:    Oral  SpO2:  96%    Weight:      Height:        Intake/Output Summary (Last 24 hours) at 07/01/2022 1308 Last data filed at 07/01/2022 1121 Gross per 24 hour  Intake 554.51 ml  Output 4600 ml  Net -4045.49 ml   Weight change: 0 kg Exam:  General:  Pt is alert, follows commands appropriately, not in acute distress HEENT: No icterus, No thrush, No neck mass, [place]/AT Cardiovascular: RRR, S1/S2, no rubs, no gallops Respiratory: bibasilar rales. No wheeze Abdomen: Soft/+BS, non tender, non distended, no guarding Extremities: 1 + LE edema, No lymphangitis, No petechiae, No rashes, no synovitis   Data Reviewed: I have personally reviewed following labs and imaging studies Basic Metabolic Panel: Recent Labs  Lab 06/25/22 0427 06/26/22 0411 06/27/22 0443 06/28/22 0419 06/29/22 0438 06/30/22 0346 07/01/22 0442  NA 128* 133* 133* 131* 131* 131* 133*  K 3.2* 3.3* 3.2* 3.6 3.3* 3.2* 3.3*  CL 104 102 100 99 98 95* 97*  CO2 15* 18* 23 21* 25 23 25   GLUCOSE 102* 108* 103* 91 109* 82 107*  BUN 78* 60* 41* 51* 35* 41* 30*  CREATININE 12.34* 10.26* 8.00* 9.26* 6.81* 7.80* 5.43*  CALCIUM 6.3* 6.7* 6.5* 6.3* 6.4* 6.7* 6.9*  MG 2.3 2.2  --   --   --  2.0  --   PHOS 7.8* 6.9* 5.0* 6.2*  4.7* 5.8* 3.8   Liver Function Tests: Recent Labs  Lab 06/27/22 0443 06/28/22 0419 06/29/22 0438 06/30/22 0346 07/01/22 0442  ALBUMIN 1.7* 1.7* 1.7* 1.8* 1.8*   No results for input(s): "LIPASE", "AMYLASE" in the last 168 hours. No results for input(s): "AMMONIA" in the last 168 hours. Coagulation Profile: No results for input(s): "INR", "PROTIME" in the last 168 hours. CBC: Recent Labs  Lab 06/25/22 0427 06/26/22 0411 06/27/22 0443 06/28/22 0419 06/29/22 0438 06/30/22 0346  WBC 10.9* 8.8 9.1 10.2 10.3 11.1*  NEUTROABS 7.1 5.8  --   --   --   --   HGB 7.4* 6.9* 8.0* 7.8* 8.1* 8.5*  HCT 22.7* 21.3* 24.6* 24.2* 25.1* 26.0*  MCV 93.0 92.2 92.1 93.4 92.6 91.9  PLT 198 163 178 153 218 236   Cardiac Enzymes: No results for input(s): "CKTOTAL", "CKMB", "CKMBINDEX", "TROPONINI" in the last 168 hours. BNP: Invalid input(s): "POCBNP" CBG: Recent Labs  Lab 06/30/22 1630 06/30/22 2104 07/01/22 0317 07/01/22 0719 07/01/22 1135  GLUCAP 100* 195* 88 97 167*   HbA1C: No results for input(s): "HGBA1C" in the last 72 hours. Urine analysis:    Component Value Date/Time   COLORURINE STRAW (A) 12/25/2020 2057   APPEARANCEUR CLEAR 12/25/2020 2057   LABSPEC 1.006 12/25/2020 2057   PHURINE 8.0 12/25/2020 2057   GLUCOSEU 50 (A) 12/25/2020 2057   HGBUR SMALL (A) 12/25/2020 2057   BILIRUBINUR NEGATIVE 12/25/2020 2057   KETONESUR NEGATIVE 12/25/2020 2057   PROTEINUR 30 (A) 12/25/2020 2057   NITRITE NEGATIVE 12/25/2020 2057   LEUKOCYTESUR NEGATIVE 12/25/2020 2057   Sepsis Labs: @LABRCNTIP (procalcitonin:4,lacticidven:4) ) Recent Results (from the past 240 hour(s))  Resp panel by RT-PCR (RSV, Flu A&B, Covid) Anterior Nasal Swab     Status: None   Collection Time: 06/24/22 10:12 AM   Specimen: Anterior Nasal Swab  Result Value Ref Range Status   SARS Coronavirus 2 by RT PCR NEGATIVE NEGATIVE Final    Comment: (NOTE) SARS-CoV-2 target nucleic acids are NOT DETECTED.  The  SARS-CoV-2 RNA is generally detectable in upper respiratory specimens during the acute phase of infection. The lowest concentration of SARS-CoV-2 viral copies this assay can detect is 138 copies/mL. A negative result does not preclude SARS-Cov-2 infection and should not be used as the sole basis for treatment or other patient management decisions. A negative result may occur with  improper specimen collection/handling, submission of specimen other than nasopharyngeal swab, presence of viral mutation(s) within the areas targeted by this assay, and inadequate number of viral copies(<138 copies/mL). A negative result must be combined with clinical observations, patient history, and epidemiological information. The expected result is Negative.  Fact Sheet for Patients:  08/23/22  Fact Sheet for Healthcare Providers:  BloggerCourse.com  This test is no t yet approved or cleared by the [employer] FDA and  has been authorized for detection and/or diagnosis of SARS-CoV-2 by FDA under an Emergency Use Authorization (EUA). This EUA will remain  in effect (meaning this test can be used) for the duration of the COVID-19 declaration under Section 564(b)(1) of the Act, 21 U.S.C.section 360bbb-3(b)(1), unless the authorization is terminated  or revoked sooner.       Influenza A by PCR NEGATIVE NEGATIVE Final   Influenza B by PCR NEGATIVE NEGATIVE Final    Comment: (NOTE) The Xpert Xpress SARS-CoV-2/FLU/RSV plus assay is intended as an aid in the diagnosis of influenza from Nasopharyngeal swab specimens and should not be used as a sole basis for treatment. Nasal washings and aspirates are unacceptable for Xpert Xpress SARS-CoV-2/FLU/RSV testing.  Fact Sheet for Patients: [place]  Fact Sheet for Healthcare Providers: BloggerCourse.com  This test is not yet approved or  cleared by the [employer] FDA and has been authorized for detection and/or diagnosis of SARS-CoV-2 by FDA under an Emergency Use Authorization (EUA). This EUA will remain in effect (meaning this test can be used) for the duration of the COVID-19 declaration under Section 564(b)(1) of the Act, 21 U.S.C. section 360bbb-3(b)(1), unless the authorization is terminated or revoked.     Resp Syncytial Virus by PCR NEGATIVE NEGATIVE Final    Comment: (NOTE) Fact Sheet for Patients: [place]  Fact Sheet for Healthcare Providers: BloggerCourse.com  This test is not yet approved or cleared  by the [place] and has been authorized for detection and/or diagnosis of SARS-CoV-2 by FDA under an Emergency Use Authorization (EUA). This EUA will remain in effect (meaning this test can be used) for the duration of the COVID-19 declaration under Section 564(b)(1) of the Act, 21 U.S.C. section 360bbb-3(b)(1), unless the authorization is terminated or revoked.  Performed at [facility], [address], [place], [place] [address]   MRSA Next Gen by PCR, Nasal     Status: None   Collection Time: 06/24/22 10:12 AM   Specimen: Anterior Nasal Swab  Result Value Ref Range Status   MRSA by PCR Next Gen NOT DETECTED NOT DETECTED Final    Comment: (NOTE) The GeneXpert MRSA Assay (FDA approved for NASAL specimens only), is one component of a comprehensive MRSA colonization surveillance program. It is not intended to diagnose MRSA infection nor to guide or monitor treatment for MRSA infections. Test performance is not FDA approved in patients less than 81 years old. Performed at [facility], [address] [place], [place] [address]      Scheduled Meds:  sodium chloride   Intravenous Once   amLODipine  10 mg Oral Daily   calcitRIOL  0.5 mcg Oral Daily   carvedilol  25 mg Oral BID WC   Chlorhexidine Gluconate Cloth  6 each Topical Daily    Chlorhexidine Gluconate Cloth  6 each Topical Q0600   darbepoetin (ARANESP) injection - DIALYSIS  200 mcg Subcutaneous Q Mon-1800   furosemide  80 mg Intravenous Daily   heparin  5,000 Units Subcutaneous Q8H   insulin aspart  0-6 Units Subcutaneous TID WC   insulin aspart  2 Units Subcutaneous TID WC   sacubitril-valsartan  1 tablet Oral BID   spironolactone  25 mg Oral Daily   Continuous Infusions:  ferric gluconate (FERRLECIT) IVPB 135 mL/hr at 07/01/22 1121   niCARDipine Stopped (06/30/22 1446)    Procedures/Studies: DG CHEST PORT 1 VIEW  Result Date: 06/28/2022 CLINICAL DATA:  Pulmonary edema EXAM: PORTABLE CHEST 1 VIEW COMPARISON:  Chest radiograph 06/25/2022 FINDINGS: Right-sided central venous catheter with the tip in the mid SVC, unchanged. Right costophrenic angle is out of the field of view. Possible small left pleural effusion. No pneumothorax. Unchanged cardiac mediastinal contours. No focal airspace opacity. There are prominent bilateral interstitial opacities, which could represent pulmonary venous congestion or mild pulmonary edema. Degenerative changes of the right AC joint. No radiographically apparent displaced rib fractures. IMPRESSION: Prominent bilateral interstitial opacities, which could represent pulmonary venous congestion or mild pulmonary edema. Unchanged small left pleural effusion. Electronically Signed   By: Marin Roberts M.D.   On: 06/28/2022 06:54   DG Chest Port 1 View  Result Date: 06/25/2022 CLINICAL DATA:  Postoperative vascular dialysis catheter. EXAM: PORTABLE CHEST 1 VIEW COMPARISON:  06/24/2022 FINDINGS: Interval placement of a right central venous catheter with tip over the low SVC region. No pneumothorax. Mediastinal contours appear intact. Heart size and pulmonary vascularity are mildly prominent but improved since prior study. Small bilateral pleural effusions. IMPRESSION: 1. Right central venous catheter appears in appropriate radiographic position.  No pneumothorax. 2. Cardiac enlargement with mild vascular congestion, improved since prior study. Small bilateral pleural effusions. Electronically Signed   By: Lucienne Capers M.D.   On: 06/25/2022 18:53   DG C-Arm 1-60 Min-No Report  Result Date: 06/25/2022 Fluoroscopy was utilized by the requesting physician.  No radiographic interpretation.   ECHOCARDIOGRAM COMPLETE  Result Date: 06/24/2022    ECHOCARDIOGRAM REPORT  Patient Name:   Matthew Robles Date of Exam: 06/24/2022 Medical Rec #:  [id number]       Height:       72.0 in Accession #:    [id number]      Weight:       194.0 lb Date of Birth:  11-Oct-1955       BSA:          2.103 m Patient Age:    63 years        BP:           154/90 mmHg Patient Gender: M               HR:           86 bpm. [facility] Procedure: 2D Echo, Cardiac Doppler and Color Doppler Indications:    Congestive Heart Failure I50.9  History:        Patient has no prior history of Echocardiogram examinations.                 CHF, Stroke; Risk Factors:Hypertension, Diabetes and Current                 Smoker. Acute respiratory failure with hypoxia, ARF (acute renal                 failure).  Sonographer:    Celesta Gentile RCS Referring Phys: [phone] CLAN[place] L JOHNSON IMPRESSIONS  1. Left ventricular ejection fraction, by estimation, is 45 to 50%. The left ventricle has mildly decreased function. The left ventricle demonstrates global hypokinesis. There is moderate concentric left ventricular hypertrophy. Left ventricular diastolic parameters are consistent with Grade II diastolic dysfunction (pseudonormalization).  2. Right ventricular systolic function is mildly reduced. The right ventricular size is normal. Tricuspid regurgitation signal is inadequate for assessing PA pressure.  3. Left atrial size was severely dilated.  4. Right atrial size was moderately dilated.  5. Left pleural effusion noted.  6. The mitral valve is degenerative. Mild mitral valve regurgitation.  7.  The aortic valve is tricuspid. There is mild calcification of the aortic valve. Aortic valve regurgitation is trivial. Aortic valve sclerosis/calcification is present, without any evidence of aortic stenosis.  8. The inferior vena cava is dilated in size with >50% respiratory variability, suggesting right atrial pressure of 8 mmHg. Comparison(s): Prior images unable to be directly viewed. FINDINGS  Left Ventricle: Left ventricular ejection fraction, by estimation, is 45 to 50%. The left ventricle has mildly decreased function. The left ventricle demonstrates global hypokinesis. The left ventricular internal cavity size was normal in size. There is  moderate concentric left ventricular hypertrophy. Left ventricular diastolic parameters are consistent with Grade II diastolic dysfunction (pseudonormalization). Right Ventricle: The right ventricular size is normal. No increase in right ventricular wall thickness. Right ventricular systolic function is mildly reduced. Tricuspid regurgitation signal is inadequate for assessing PA pressure. Left Atrium: Left atrial size was severely dilated. Right Atrium: Right atrial size was moderately dilated. Pericardium: Left pleural effusion noted. There is no evidence of pericardial effusion. Mitral Valve: The mitral valve is degenerative in appearance. There is mild calcification of the mitral valve leaflet(s). Mild to moderate mitral annular calcification. Mild mitral valve regurgitation. Tricuspid Valve: The tricuspid valve is grossly normal. Tricuspid valve regurgitation is trivial. Aortic Valve: The aortic valve is tricuspid. There is mild calcification of the aortic valve. There is mild aortic valve annular calcification. Aortic valve regurgitation is trivial. Aortic valve sclerosis/calcification is present, without  any evidence of aortic stenosis. Pulmonic Valve: The pulmonic valve was grossly normal. Pulmonic valve regurgitation is trivial. Aorta: The aortic root is normal  in size and structure. Venous: The inferior vena cava is dilated in size with greater than 50% respiratory variability, suggesting right atrial pressure of 8 mmHg. IAS/Shunts: No atrial level shunt detected by color flow Doppler.  LEFT VENTRICLE PLAX 2D LVIDd:         5.40 cm   Diastology LVIDs:         4.10 cm   LV e' medial:    6.42 cm/s LV PW:         1.40 cm   LV E/e' medial:  22.1 LV IVS:        1.40 cm   LV e' lateral:   8.26 cm/s LVOT diam:     2.00 cm   LV E/e' lateral: 17.2 LV SV:         55 LV SV Index:   26 LVOT Area:     3.14 cm  RIGHT VENTRICLE RV S prime:     14.50 cm/s TAPSE (M-mode): 2.4 cm LEFT ATRIUM              Index        RIGHT ATRIUM           Index LA diam:        4.60 cm  2.19 cm/m   RA Area:     28.20 cm LA Vol (A2C):   140.0 ml 66.57 ml/m  RA Volume:   100.00 ml 47.55 ml/m LA Vol (A4C):   105.0 ml 49.92 ml/m LA Biplane Vol: 122.0 ml 58.01 ml/m  AORTIC VALVE LVOT Vmax:   90.00 cm/s LVOT Vmean:  63.600 cm/s LVOT VTI:    0.174 m  AORTA Ao Root diam: 3.50 cm MITRAL VALVE MV Area (PHT): 5.66 cm     SHUNTS MV Decel Time: 134 msec     Systemic VTI:  0.17 m MV E velocity: 142.00 cm/s  Systemic Diam: 2.00 cm MV A velocity: 56.90 cm/s MV E/A ratio:  2.50 Nona Dell MD Electronically signed by Nona Dell MD Signature Date/Time: 06/24/2022/2:58:46 PM    Final    CT Renal Stone Study  Result Date: 06/24/2022 CLINICAL DATA:  Abdominal pain and flank pain with kidney stone suspected in a 67 year old male. EXAM: CT ABDOMEN AND PELVIS WITHOUT CONTRAST TECHNIQUE: Multidetector CT imaging of the abdomen and pelvis was performed following the standard protocol without IV contrast. RADIATION DOSE REDUCTION: This exam was performed according to the departmental dose-optimization program which includes automated exposure control, adjustment of the mA and/or kV according to patient size and/or use of iterative reconstruction technique. COMPARISON:  Renal sonogram from 2019. No recent abdominal  imaging for comparison. FINDINGS: Lower chest: Moderately large RIGHT and moderate LEFT pleural effusions. Septal thickening at the lung bases. Basilar volume loss/consolidation bilaterally. Heart size is enlarged. Low-attenuation cardiac blood pool. No pericardial effusion. Heart is incompletely imaged. No chest wall abnormality. Hepatobiliary: Smooth hepatic contours. No pericholecystic stranding. No gross biliary duct distension. No visible lesion on noncontrast imaging. Small amount of sludge in the gallbladder. Pancreas: Pancreas with normal contours, no signs of inflammation or peripancreatic fluid. Spleen: Normal. Adrenals/Urinary Tract: LEFT adrenal nodule measures 25 Hounsfield units and is 12 x 11 mm. Lobular bilateral renal contours a represent fetal lobation or scarring. No nephrolithiasis. No hydronephrosis. Mild perinephric stranding. No substantial perivesical stranding. No ureteral calculi. Stomach/Bowel: Small hiatal  hernia. No stranding adjacent to the stomach. No sign of small bowel obstruction. No sign of small bowel inflammation. Normal appendix. No colonic distension or inflammation. Vascular/Lymphatic: Aortic atherosclerosis. No sign of aneurysm. Smooth contour of the IVC. There is no gastrohepatic or hepatoduodenal ligament lymphadenopathy. No retroperitoneal or mesenteric lymphadenopathy. No pelvic sidewall lymphadenopathy. Calcified atherosclerotic changes are moderate is limited assessment of vascular structures due to lack of intravenous contrast. Reproductive: Unremarkable by CT. Other: Body wall edema. More pronounced periumbilical edema than other areas over the abdomen. Flank edema is symmetric. No substantial intra-abdominal fluid. No pneumoperitoneum. Musculoskeletal: No acute or destructive bone process. Mild spinal degenerative changes. IMPRESSION: 1. Suggestion of heart failure or volume overload in this patient with cardiomegaly, with mild to moderate anasarca associated with  bilateral pleural effusions and pulmonary edema. 2. Basilar airspace disease likely related to volume loss. Would also correlate with signs of infection. 3. More pronounced stranding about the umbilicus of uncertain significance, potentially related to underlying edema but given more concentrated stranding in this area would suggest correlation with any signs of infection/cellulitis. 4. No acute intra-abdominal or pelvic findings. No signs of nephrolithiasis or ureteral calculi. Normal appendix. 5. 1.2 cm left adrenal mass, probable benign adenoma. Recommend follow-up adrenal washout CT in 1 year. If stable for = 1 year, no further follow-up imaging. JACR 2017 Aug; 14(8):1038-44, JCAT 2016 Mar-Apr; 40(2):194-200, Urol J 2006 Spring; 3(2):71-4. Also consider the following. Based on current clinical literature, biochemical evaluation to exclude possible functioning adrenal nodule is suggested if not already performed. Please refer to current clinical guidelines for detailed recommendations. NEJM 4627:035 154-51. 6. Low-attenuation cardiac blood pool, can be seen in the setting of anemia. 7. Small hiatal hernia. 8. Aortic atherosclerosis. Aortic Atherosclerosis (ICD10-I70.0). Electronically Signed   By: Zetta Bills M.D.   On: 06/24/2022 08:28   DG Chest Portable 1 View  Result Date: 06/24/2022 CLINICAL DATA:  Evaluate for dyspnea.  History of CHF EXAM: PORTABLE CHEST 1 VIEW COMPARISON:  12/25/2020 FINDINGS: Chronic cardiomegaly. Small pleural effusions and bilateral airspace disease greater on the right. No pneumothorax. IMPRESSION: CHF pattern. Electronically Signed   By: Jorje Guild M.D.   On: 06/24/2022 05:48    Orson Eva, DO  [facility]  If 7PM-7AM, please contact night-coverage [email or web address] Password [email or web address] 07/01/2022, 1:08 PM   LOS: 7 days

## 2022-07-01 NOTE — Progress Notes (Signed)
Patient ID: Sadat Sliwa, male   DOB: 06-06-1955, 67 y.o.   MRN: [id number] S: No new complaints but would like to have PT get him out of bed.  "I've been lying in bed for a week". O:BP (!) 180/86   Pulse 89   Temp 98.8 F (37.1 C) (Axillary)   Resp 17   Ht 6' (1.829 m)   Wt 85.6 kg   SpO2 96%   BMI 25.59 kg/m   Intake/Output Summary (Last 24 hours) at 07/01/2022 1028 Last data filed at 07/01/2022 0503 Gross per 24 hour  Intake 1287.36 ml  Output 4600 ml  Net -3312.64 ml   Intake/Output: I/O last 3 completed shifts: In: 2127.1 [P.O.:720; I.V.:1137.1; IV Piggyback:270] Out: 4098 [Urine:775; Other:7000]  Intake/Output this shift:  No intake/output data recorded. Weight change: 0 kg Gen: NAD CVS: RRR Resp:CTA Abd: +BS, soft, NT/ND Ext: trace-1+ pretibial edema  Recent Labs  Lab 06/25/22 0427 06/26/22 0411 06/27/22 0443 06/28/22 0419 06/29/22 0438 06/30/22 0346 07/01/22 0442  NA 128* 133* 133* 131* 131* 131* 133*  K 3.2* 3.3* 3.2* 3.6 3.3* 3.2* 3.3*  CL 104 102 100 99 98 95* 97*  CO2 15* 18* 23 21* 25 23 25   GLUCOSE 102* 108* 103* 91 109* 82 107*  BUN 78* 60* 41* 51* 35* 41* 30*  CREATININE 12.34* 10.26* 8.00* 9.26* 6.81* 7.80* 5.43*  ALBUMIN 1.8* 1.8* 1.7* 1.7* 1.7* 1.8* 1.8*  CALCIUM 6.3* 6.7* 6.5* 6.3* 6.4* 6.7* 6.9*  PHOS 7.8* 6.9* 5.0* 6.2* 4.7* 5.8* 3.8   Liver Function Tests: Recent Labs  Lab 06/29/22 0438 06/30/22 0346 07/01/22 0442  ALBUMIN 1.7* 1.8* 1.8*   No results for input(s): "LIPASE", "AMYLASE" in the last 168 hours. No results for input(s): "AMMONIA" in the last 168 hours. CBC: Recent Labs  Lab 06/25/22 0427 06/26/22 0411 06/27/22 0443 06/28/22 0419 06/29/22 0438 06/30/22 0346  WBC 10.9* 8.8 9.1 10.2 10.3 11.1*  NEUTROABS 7.1 5.8  --   --   --   --   HGB 7.4* 6.9* 8.0* 7.8* 8.1* 8.5*  HCT 22.7* 21.3* 24.6* 24.2* 25.1* 26.0*  MCV 93.0 92.2 92.1 93.4 92.6 91.9  PLT 198 163 178 153 218 236   Cardiac Enzymes: No results for  input(s): "CKTOTAL", "CKMB", "CKMBINDEX", "TROPONINI" in the last 168 hours. CBG: Recent Labs  Lab 06/30/22 1130 06/30/22 1630 06/30/22 2104 07/01/22 0317 07/01/22 0719  GLUCAP 110* 100* 195* 88 97    Iron Studies: No results for input(s): "IRON", "TIBC", "TRANSFERRIN", "FERRITIN" in the last 72 hours. Studies/Results: No results found.  sodium chloride   Intravenous Once   amLODipine  10 mg Oral Daily   calcitRIOL  0.5 mcg Oral Daily   carvedilol  25 mg Oral BID WC   Chlorhexidine Gluconate Cloth  6 each Topical Daily   Chlorhexidine Gluconate Cloth  6 each Topical Q0600   darbepoetin (ARANESP) injection - DIALYSIS  200 mcg Subcutaneous Q Mon-1800   furosemide  80 mg Intravenous Daily   heparin  5,000 Units Subcutaneous Q8H   insulin aspart  0-6 Units Subcutaneous TID WC   insulin aspart  2 Units Subcutaneous TID WC   losartan  100 mg Oral QHS   potassium chloride  20 mEq Oral Once    BMET    Component Value Date/Time   NA 133 (L) 07/01/2022 0442   K 3.3 (L) 07/01/2022 0442   CL 97 (L) 07/01/2022 0442   CO2 25 07/01/2022 0442   GLUCOSE 107 (  H) 07/01/2022 0442   BUN 30 (H) 07/01/2022 0442   CREATININE 5.43 (H) 07/01/2022 0442   CALCIUM 6.9 (L) 07/01/2022 0442   GFRNONAA 11 (L) 07/01/2022 0442   GFRAA 35 (L) 10/31/2018 1457   CBC    Component Value Date/Time   WBC 11.1 (H) 06/30/2022 0346   RBC 2.83 (L) 06/30/2022 0346   HGB 8.5 (L) 06/30/2022 0346   HCT 26.0 (L) 06/30/2022 0346   PLT 236 06/30/2022 0346   MCV 91.9 06/30/2022 0346   MCH 30.0 06/30/2022 0346   MCHC 32.7 06/30/2022 0346   RDW 14.5 06/30/2022 0346   LYMPHSABS 0.5 (L) 06/26/2022 0411   MONOABS 0.8 06/26/2022 0411   EOSABS 1.5 (H) 06/26/2022 0411   BASOSABS 0.1 06/26/2022 0411    Assessment/Plan:   New ESRD - had progressive CKD and now dialysis dependent.  S/p RIJ [facility] 06/25/22 and first HD on 2/2, 2nd on 2.3, third on 2/5, and for 4th treatment 06/30/22.  Tolerating dialysis well.  Awaiting  outpatient HD arrangements but leaning towards [facility].  Plan for HD tomorrow. HTN emergency - improved with nicardipine gtt and UF with HD.  Continue to UF with HD.  Will add spironolactone 25 mg daily as he has a history of hyperaldosteronism.   Acute hypoxic respiratory failure - due to volume overload.  Covid, flud, and RSV negative.  Improved with UF. CKD-MBD - elevated iPTH at 418, low calcium at 6.8, phos 5.8.  Will start binders and continue with vitamin D. Anemia of CKD stage V - on ESA and iron, s/p PRBC's. Hyponatremia - impaired free water excretion and will follow with HD. Hypokalemia - improving.  Will use 4K bath with HD.  Has history of elevated aldosterone/PRA in the past.  May benefit from spironolactone.  Will start 25 mg daily and follow his response closely.   Vascular access - has RIJ TDC placed 06/25/22 by Dr. Constance Haw.  Seen by Dr. Donnetta Hutching and will arrange outpatient vein mapping and possible left arm AVF. Disposition - will need outpatient HD to be arranged.  Donetta Potts, MD [facility]

## 2022-07-02 LAB — RENAL FUNCTION PANEL
Albumin: 1.6 g/dL — ABNORMAL LOW (ref 3.5–5.0)
Anion gap: 9 (ref 5–15)
BUN: 35 mg/dL — ABNORMAL HIGH (ref 8–23)
CO2: 24 mmol/L (ref 22–32)
Calcium: 6.9 mg/dL — ABNORMAL LOW (ref 8.9–10.3)
Chloride: 101 mmol/L (ref 98–111)
Creatinine, Ser: 6.62 mg/dL — ABNORMAL HIGH (ref 0.61–1.24)
GFR, Estimated: 9 mL/min — ABNORMAL LOW (ref >60)
Glucose, Bld: 143 mg/dL — ABNORMAL HIGH (ref 70–99)
Phosphorus: 4.1 mg/dL (ref 2.5–4.6)
Potassium: 3.2 mmol/L — ABNORMAL LOW (ref 3.5–5.1)
Sodium: 134 mmol/L — ABNORMAL LOW (ref 135–145)

## 2022-07-02 LAB — CBC
HCT: 26.9 % — ABNORMAL LOW (ref 39.0–52.0)
Hemoglobin: 8.6 g/dL — ABNORMAL LOW (ref 13.0–17.0)
MCH: 30.3 pg (ref 26.0–34.0)
MCHC: 32 g/dL (ref 30.0–36.0)
MCV: 94.7 fL (ref 80.0–100.0)
Platelets: 265 10*3/uL (ref 150–400)
RBC: 2.84 MIL/uL — ABNORMAL LOW (ref 4.22–5.81)
RDW: 14.9 % (ref 11.5–15.5)
WBC: 12.6 10*3/uL — ABNORMAL HIGH (ref 4.0–10.5)
nRBC: 0.2 % (ref 0.0–0.2)

## 2022-07-02 LAB — GLUCOSE, CAPILLARY
Glucose-Capillary: 109 mg/dL — ABNORMAL HIGH (ref 70–99)
Glucose-Capillary: 125 mg/dL — ABNORMAL HIGH (ref 70–99)
Glucose-Capillary: 78 mg/dL (ref 70–99)
Glucose-Capillary: 149 mg/dL — ABNORMAL HIGH (ref 70–99)
Glucose-Capillary: 167 mg/dL — ABNORMAL HIGH (ref 70–99)

## 2022-07-02 MED ORDER — POTASSIUM CHLORIDE CRYS ER 20 MEQ PO TBCR
40.0000 meq | EXTENDED_RELEASE_TABLET | Freq: Once | ORAL | Status: AC
  Administered 2022-07-02: 40 meq via ORAL
  Filled 2022-07-02: qty 2

## 2022-07-02 MED ORDER — CARVEDILOL 12.5 MG PO TABS: 12.5000 mg | ORAL_TABLET | Freq: Once | ORAL | Status: DC

## 2022-07-02 MED ORDER — CARVEDILOL 12.5 MG PO TABS: 37.5000 mg | ORAL_TABLET | Freq: Two times a day (BID) | ORAL | Status: DC

## 2022-07-02 MED ORDER — HEPARIN SODIUM (PORCINE) 1000 UNIT/ML DIALYSIS
20.0000 [IU]/kg | INTRAMUSCULAR | Status: DC | PRN
  Administered 2022-07-02 – 2022-07-05 (×2): 1700 [IU] via INTRAVENOUS_CENTRAL

## 2022-07-02 MED ORDER — CARVEDILOL 12.5 MG PO TABS
37.5000 mg | ORAL_TABLET | Freq: Two times a day (BID) | ORAL | Status: DC
  Administered 2022-07-02 – 2022-07-05 (×7): 37.5 mg via ORAL
  Filled 2022-07-02 (×5): qty 3

## 2022-07-02 NOTE — Progress Notes (Signed)
Patient ID: Matthew Robles, male   DOB: [birth year], 67 y.o.   MRN: [id number] S: Complaining of some DOE but hasn't gotten out of bed since admission and feels weak. O:BP (!) 172/80   Pulse 83   Temp 98.8 F (37.1 C) (Oral)   Resp 20   Ht 6' (1.829 m)   Wt 88.2 kg   SpO2 93%   BMI 26.37 kg/m   Intake/Output Summary (Last 24 hours) at 07/02/2022 0926 Last data filed at 07/02/2022 0300 Gross per 24 hour  Intake 253.72 ml  Output 800 ml  Net -546.28 ml   Intake/Output: I/O last 3 completed shifts: In: 493.7 [P.O.:480; I.V.:10; IV Piggyback:3.7] Out: 1400 [id number]  Intake/Output this shift:  No intake/output data recorded. Weight change: 2.1 kg Gen:NAD CVS: RRR Resp: CTA Abd:+BS, soft, NT/ND Ext: no edema  Recent Labs  Lab 06/26/22 0411 06/27/22 0443 06/28/22 0419 06/29/22 0438 06/30/22 0346 07/01/22 0442 07/02/22 0530  NA 133* 133* 131* 131* 131* 133* 134*  K 3.3* 3.2* 3.6 3.3* 3.2* 3.3* 3.2*  CL 102 100 99 98 95* 97* 101  CO2 18* 23 21* 25 23 25 24  $ GLUCOSE 108* 103* 91 109* 82 107* 143*  BUN 60* 41* 51* 35* 41* 30* 35*  CREATININE 10.26* 8.00* 9.26* 6.81* 7.80* 5.43* 6.62*  ALBUMIN 1.8* 1.7* 1.7* 1.7* 1.8* 1.8* 1.6*  CALCIUM 6.7* 6.5* 6.3* 6.4* 6.7* 6.9* 6.9*  PHOS 6.9* 5.0* 6.2* 4.7* 5.8* 3.8 4.1   Liver Function Tests: Recent Labs  Lab 06/30/22 0346 07/01/22 0442 07/02/22 0530  ALBUMIN 1.8* 1.8* 1.6*   No results for input(s): "LIPASE", "AMYLASE" in the last 168 hours. No results for input(s): "AMMONIA" in the last 168 hours. CBC: Recent Labs  Lab 06/26/22 0411 06/27/22 0443 06/28/22 0419 06/29/22 0438 06/30/22 0346  WBC 8.8 9.1 10.2 10.3 11.1*  NEUTROABS 5.8  --   --   --   --   HGB 6.9* 8.0* 7.8* 8.1* 8.5*  HCT 21.3* 24.6* 24.2* 25.1* 26.0*  MCV 92.2 92.1 93.4 92.6 91.9  PLT 163 178 153 218 236   Cardiac Enzymes: No results for input(s): "CKTOTAL", "CKMB", "CKMBINDEX", "TROPONINI" in the last 168 hours. CBG: Recent Labs  Lab  07/01/22 1135 07/01/22 1613 07/01/22 2117 07/02/22 0300 07/02/22 0724  GLUCAP 167* 118* 107* 109* 125*    Iron Studies: No results for input(s): "IRON", "TIBC", "TRANSFERRIN", "FERRITIN" in the last 72 hours. Studies/Results: No results found.  sodium chloride   Intravenous Once   amLODipine  10 mg Oral Daily   calcitRIOL  0.5 mcg Oral Daily   carvedilol  37.5 mg Oral BID WC   Chlorhexidine Gluconate Cloth  6 each Topical Daily   Chlorhexidine Gluconate Cloth  6 each Topical Q0600   darbepoetin (ARANESP) injection - DIALYSIS  200 mcg Subcutaneous Q Mon-1800   furosemide  80 mg Intravenous Daily   heparin  5,000 Units Subcutaneous Q8H   insulin aspart  0-6 Units Subcutaneous TID WC   insulin aspart  2 Units Subcutaneous TID WC   sacubitril-valsartan  1 tablet Oral BID   spironolactone  25 mg Oral Daily    BMET    Component Value Date/Time   NA 134 (L) 07/02/2022 0530   K 3.2 (L) 07/02/2022 0530   CL 101 07/02/2022 0530   CO2 24 07/02/2022 0530   GLUCOSE 143 (H) 07/02/2022 0530   BUN 35 (H) 07/02/2022 0530   CREATININE 6.62 (H) 07/02/2022 0530   CALCIUM 6.9 (  L) 07/02/2022 0530   GFRNONAA 9 (L) 07/02/2022 0530   GFRAA 35 (L) 10/31/2018 1457   CBC    Component Value Date/Time   WBC 11.1 (H) 06/30/2022 0346   RBC 2.83 (L) 06/30/2022 0346   HGB 8.5 (L) 06/30/2022 0346   HCT 26.0 (L) 06/30/2022 0346   PLT 236 06/30/2022 0346   MCV 91.9 06/30/2022 0346   MCH 30.0 06/30/2022 0346   MCHC 32.7 06/30/2022 0346   RDW 14.5 06/30/2022 0346   LYMPHSABS 0.5 (L) 06/26/2022 0411   MONOABS 0.8 06/26/2022 0411   EOSABS 1.5 (H) 06/26/2022 0411   BASOSABS 0.1 06/26/2022 0411    Assessment/Plan:   New ESRD - had progressive CKD and now dialysis dependent.  S/p RIJ [facility] 06/25/22 and first HD on 2/2, 2nd on 2.3, third on 2/5, and for 4th treatment 06/30/22.  Tolerating dialysis well.  Awaiting outpatient HD arrangements but leaning towards [facility].  Plan for HD today and again on Monday.   Then will switch to TTS schedule on 07/06/22 at [facility]. HTN emergency - improved with nicardipine gtt and UF with HD.  Continue to UF with HD.  Added spironolactone 25 mg daily as he has a history of hyperaldosteronism and persistent hypokalemia.  Cardiology following.  Remains on nicardipine gtt.  Cont Entresto, spironolactone, carvedilol.  May need hydralazine and nitrates as well. Acute hypoxic respiratory failure - due to volume overload.  Covid, flud, and RSV negative.  Improved with UF. CKD-MBD - elevated iPTH at 418, low calcium at 6.8, phos 5.8.  Will start binders and continue with vitamin D. Anemia of CKD stage V - on ESA and iron, s/p PRBC's. Hyponatremia - impaired free water excretion and will follow with HD. Hypokalemia - improving.  Will use 4K bath with HD.  Continue with KCl supplementation.  Mg level normal.  Has history of elevated aldosterone/PRA in the past.  May benefit from spironolactone.  Started 25 mg daily on 07/01/22 and follow his response closely.   Vascular access - has RIJ TDC placed 06/25/22 by Dr. Constance Haw.  Seen by Dr. Donnetta Hutching and will arrange outpatient vein mapping and possible left arm AVF. Disposition - has been accepted at [facility] starting 07/06/22 if stable for discharge.    Donetta Potts, MD [facility]  Pt will not be physically seen over the weekend but EMR will be reviewed remotely.  On-call coverage available if needed.

## 2022-07-02 NOTE — Procedures (Signed)
   HEMODIALYSIS TREATMENT NOTE:   Uneventful 3.75 hour heparin-free treatment completed using RIJ TDC. Cath dressing changed; exit site is unremarkable. Goal met: 4 liters removed without interruption in UF.  All blood was returned.  Hand-off given to Doy Mince, RN.   Rockwell Alexandria, RN [facility]

## 2022-07-02 NOTE — Progress Notes (Signed)
PROGRESS NOTE  Matthew Robles [id number] DOB: 11/25/1955 DOA: 06/24/2022 PCP: Pcp, No  Brief History:  67 year old gentleman with longstanding hypertension since age 1, stage IV CKD, type 2 diabetes mellitus with neuropathy, gout, sleep disorder, history of stroke, congestive heart failure, anemia, chronic edema who established nephrology care with Dr. Trinda Pascal on 06/16/2021.  He apparently was lost to follow up.   He reported that over the past 2 weeks has had progressive symptoms of increasing edema in the lower extremities and poor urine output.  He reports that he noticed less than 2 ounces of urine in the past couple of days.  He is also had a cough and increasing shortness of breath that has worsened over the past 3 days.  He denies chest pain and palpitations.  He has noticed significant increase in the edema in the legs.  It is affecting his ambulation.  He also noticed increased abdominal distention from edema.  He reports that he has been taking his medications for blood pressure.  He reports that he takes amlodipine and hydralazine.  He does have a history of poor compliance with medications.     Patient presented to the emergency department with acute respiratory distress that woke him up this morning.  His pulse ox was 60%.  EMS had him on CPAP.  The ED placed him on BiPAP with improvement in symptoms.  His chest x-ray showed pulmonary edema and bilateral pleural effusions.  His BNP was markedly elevated.  He appears clinically volume overloaded.  He was given IV Lasix with no significant urine output.  His creatinine was elevated at 12.  His blood pressure was markedly elevated and he was placed on an IV nitroglycerin infusion.  Nephrology was consulted.  He is being admitted for management of hypertensive emergency.   Assessment/Plan:  Hypertensive emergency  - pt presented with markedly elevated BP and AKI - now is off IV nitroglycerin (ineffective); started IV  nicardipine on 2/5   - HD treatments for volume removal  - weaned off nicardipine 2/7 evening - continue amlodipine, coreg, spiro, Entresto -anticipate continued improvement of BP with HD - increase coreg   AKI on CKD stage IV--->now ESRD  - records indicate he was stage IV CKD 1 year ago - he is having poor urinary output at this time - foley catheter discontinued  - appreciate nephrology consultation - nephrology started HD 2/2 --TDC placed 2/2 - Daily renal function panel ordered - next HD on 2/9 - pt has HD chair at [facility] starting 2/[facility]>>tentatively plan d/c after HD 2/10 if medically stable    Acute HFmrEF -06/24/22 echo--EF 45-50%, mild decrease RVF, G2DD, global HK -02/22/18 Echo EF 50-55%, no WMA, G2DD -transition metoprolol to carvedilol -consult cardiology--appreciated -Entresto and spiro started -SGLT2i contraindicated in HD patient    Non Anion Gap Metabolic Acidosis - resolved  - secondary to acute renal failure  - HD treatments started and acidosis has resolved   - nephrology team consulting    Acute respiratory failure with hypoxia  - secondary to pulmonary edema, Acute HFpEF - pt responded well to bipap therapy - he is now off bipap therapy but will leave for PRN treatment and QHS treatment - HD started  - RSV, Covid, Flu testing negative  -now stable on 2-3 L -has at least 30 pack years tobacco   Type 2 Diabetes Mellitus with nephropathy -06/24/22 A1C--5.9 -continue novolog sliding scale -CBGs controlled  Leukocytosis - resolved - likley stress demargination - resolved    Hypokalemia - correcting on HD - supplementing judiciously - following daily renal function panel - improved   Bilateral Pleural Effusion  - secondary to volume overload  - treating with diuresis vs hemodialysis - hemodialysis started 06/25/22 per nephrology - remains fluid overloaded but much improved  - CXR showing improvement in pulmonary edema     Anemia in CKD  - Hg down  to 6.7 - transfused 1 unit PRBC on 06/24/22 - transfuse 1 unit PRBC with HD on 06/26/22 - follow up anemia panel - Hg improved to 8 after 2nd unit given on 2/3 - Hgb remains stable         Family Communication:  no Family at bedside   Consultants:  renal, cardiology   Code Status:  FULL   DVT Prophylaxis:  [place] Heparin     Procedures: As Listed in Progress Note Above   Antibiotics: None       Subjective: Pt has some dyspnea on exertion.  Denies f/c, cp, n/v/d, abd pain  Objective: Vitals:   07/02/22 1300 07/02/22 1330 07/02/22 1558 07/02/22 1600  BP: (!) 181/73 (!) 182/81 (!) 196/94 (!) 196/94  Pulse: 92 86 82 82  Resp: (!) 23 (!) 23 18 17  $ Temp:   98.2 F (36.8 C)   TempSrc:   Oral   SpO2: (!) 84% 92% 92% 92%  Weight:      Height:        Intake/Output Summary (Last 24 hours) at 07/02/2022 1745 Last data filed at 07/02/2022 0934 Gross per 24 hour  Intake 240 ml  Output 700 ml  Net -460 ml   Weight change: 2.1 kg Exam:  General:  Pt is alert, follows commands appropriately, not in acute distress HEENT: No icterus, No thrush, No neck mass, [place]/AT Cardiovascular: RRR, S1/S2, no rubs, no gallops Respiratory: bibasilar rales. No wheeze Abdomen: Soft/+BS, non tender, non distended, no guarding Extremities: No edema, No lymphangitis, No petechiae, No rashes, no synovitis   Data Reviewed: I have personally reviewed following labs and imaging studies Basic Metabolic Panel: Recent Labs  Lab 06/26/22 0411 06/27/22 0443 06/28/22 0419 06/29/22 0438 06/30/22 0346 07/01/22 0442 07/02/22 0530  NA 133*   < > 131* 131* 131* 133* 134*  K 3.3*   < > 3.6 3.3* 3.2* 3.3* 3.2*  CL 102   < > 99 98 95* 97* 101  CO2 18*   < > 21* 25 23 25 24  $ GLUCOSE 108*   < > 91 109* 82 107* 143*  BUN 60*   < > 51* 35* 41* 30* 35*  CREATININE 10.26*   < > 9.26* 6.81* 7.80* 5.43* 6.62*  CALCIUM 6.7*   < > 6.3* 6.4* 6.7* 6.9* 6.9*  MG 2.2  --   --   --  2.0  --   --   PHOS 6.9*   < >  6.2* 4.7* 5.8* 3.8 4.1   < > = values in this interval not displayed.   Liver Function Tests: Recent Labs  Lab 06/28/22 0419 06/29/22 0438 06/30/22 0346 07/01/22 0442 07/02/22 0530  ALBUMIN 1.7* 1.7* 1.8* 1.8* 1.6*   No results for input(s): "LIPASE", "AMYLASE" in the last 168 hours. No results for input(s): "AMMONIA" in the last 168 hours. Coagulation Profile: No results for input(s): "INR", "PROTIME" in the last 168 hours. CBC: Recent Labs  Lab 06/26/22 0411 06/27/22 [id number] 06/28/22 [id number] 06/29/22 [id number] 06/30/22 [id number]  WBC 8.8 9.1 10.2 10.3 11.1*  NEUTROABS 5.8  --   --   --   --   HGB 6.9* 8.0* 7.8* 8.1* 8.5*  HCT 21.3* 24.6* 24.2* 25.1* 26.0*  MCV 92.2 92.1 93.4 92.6 91.9  PLT 163 178 153 218 236   Cardiac Enzymes: No results for input(s): "CKTOTAL", "CKMB", "CKMBINDEX", "TROPONINI" in the last 168 hours. BNP: Invalid input(s): "POCBNP" CBG: Recent Labs  Lab 07/01/22 2117 07/02/22 0300 07/02/22 0724 07/02/22 1120 07/02/22 1558  GLUCAP 107* 109* 125* 78 149*   HbA1C: No results for input(s): "HGBA1C" in the last 72 hours. Urine analysis:    Component Value Date/Time   COLORURINE STRAW (A) 12/25/2020 2057   APPEARANCEUR CLEAR 12/25/2020 2057   LABSPEC 1.006 12/25/2020 2057   PHURINE 8.0 12/25/2020 2057   GLUCOSEU 50 (A) 12/25/2020 2057   HGBUR SMALL (A) 12/25/2020 2057   BILIRUBINUR NEGATIVE 12/25/2020 2057   KETONESUR NEGATIVE 12/25/2020 2057   PROTEINUR 30 (A) 12/25/2020 2057   NITRITE NEGATIVE 12/25/2020 2057   LEUKOCYTESUR NEGATIVE 12/25/2020 2057   Sepsis Labs: @LABRCNTIP$ (procalcitonin:4,lacticidven:4) ) Recent Results (from the past 240 hour(s))  Resp panel by RT-PCR (RSV, Flu A&B, Covid) Anterior Nasal Swab     Status: None   Collection Time: 06/24/22 10:12 AM   Specimen: Anterior Nasal Swab  Result Value Ref Range Status   SARS Coronavirus 2 by RT PCR NEGATIVE NEGATIVE Final    Comment: (NOTE) SARS-CoV-2 target nucleic acids are NOT  DETECTED.  The SARS-CoV-2 RNA is generally detectable in upper respiratory specimens during the acute phase of infection. The lowest concentration of SARS-CoV-2 viral copies this assay can detect is 138 copies/mL. A negative result does not preclude SARS-Cov-2 infection and should not be used as the sole basis for treatment or other patient management decisions. A negative result may occur with  improper specimen collection/handling, submission of specimen other than nasopharyngeal swab, presence of viral mutation(s) within the areas targeted by this assay, and inadequate number of viral copies(<138 copies/mL). A negative result must be combined with clinical observations, patient history, and epidemiological information. The expected result is Negative.  Fact Sheet for Patients:  EntrepreneurPulse.com.au  Fact Sheet for Healthcare Providers:  IncredibleEmployment.be  This test is no t yet approved or cleared by the [place] FDA and  has been authorized for detection and/or diagnosis of SARS-CoV-2 by FDA under an Emergency Use Authorization (EUA). This EUA will remain  in effect (meaning this test can be used) for the duration of the COVID-19 declaration under Section 564(b)(1) of the Act, 21 U.S.C.section 360bbb-3(b)(1), unless the authorization is terminated  or revoked sooner.       Influenza A by PCR NEGATIVE NEGATIVE Final   Influenza B by PCR NEGATIVE NEGATIVE Final    Comment: (NOTE) The Xpert Xpress SARS-CoV-2/FLU/RSV plus assay is intended as an aid in the diagnosis of influenza from Nasopharyngeal swab specimens and should not be used as a sole basis for treatment. Nasal washings and aspirates are unacceptable for Xpert Xpress SARS-CoV-2/FLU/RSV testing.  Fact Sheet for Patients: EntrepreneurPulse.com.au  Fact Sheet for Healthcare Providers: IncredibleEmployment.be  This test is not yet  approved or cleared by the [place] FDA and has been authorized for detection and/or diagnosis of SARS-CoV-2 by FDA under an Emergency Use Authorization (EUA). This EUA will remain in effect (meaning this test can be used) for the duration of the COVID-19 declaration under Section 564(b)(1) of the Act, 21 U.S.C. section 360bbb-3(b)(1), unless the authorization  is terminated or revoked.     Resp Syncytial Virus by PCR NEGATIVE NEGATIVE Final    Comment: (NOTE) Fact Sheet for Patients: EntrepreneurPulse.com.au  Fact Sheet for Healthcare Providers: IncredibleEmployment.be  This test is not yet approved or cleared by the [place] FDA and has been authorized for detection and/or diagnosis of SARS-CoV-2 by FDA under an Emergency Use Authorization (EUA). This EUA will remain in effect (meaning this test can be used) for the duration of the COVID-19 declaration under Section 564(b)(1) of the Act, 21 U.S.C. section 360bbb-3(b)(1), unless the authorization is terminated or revoked.  Performed at [facility], [address], [place], [place] [address]   MRSA Next Gen by PCR, Nasal     Status: None   Collection Time: 06/24/22 10:12 AM   Specimen: Anterior Nasal Swab  Result Value Ref Range Status   MRSA by PCR Next Gen NOT DETECTED NOT DETECTED Final    Comment: (NOTE) The GeneXpert MRSA Assay (FDA approved for NASAL specimens only), is one component of a comprehensive MRSA colonization surveillance program. It is not intended to diagnose MRSA infection nor to guide or monitor treatment for MRSA infections. Test performance is not FDA approved in patients less than 81 years old. Performed at [facility], [address] [place], [place] [address]      Scheduled Meds:  sodium chloride   Intravenous Once   amLODipine  10 mg Oral Daily   calcitRIOL  0.5 mcg Oral Daily   carvedilol  37.5 mg Oral BID WC   Chlorhexidine Gluconate Cloth  6 each  Topical Daily   Chlorhexidine Gluconate Cloth  6 each Topical Q0600   darbepoetin (ARANESP) injection - DIALYSIS  200 mcg Subcutaneous Q Mon-1800   furosemide  80 mg Intravenous Daily   heparin  5,000 Units Subcutaneous Q8H   insulin aspart  0-6 Units Subcutaneous TID WC   insulin aspart  2 Units Subcutaneous TID WC   sacubitril-valsartan  1 tablet Oral BID   spironolactone  25 mg Oral Daily   Continuous Infusions:  niCARDipine Stopped (06/30/22 1446)    Procedures/Studies: DG CHEST PORT 1 VIEW  Result Date: 06/28/2022 CLINICAL DATA:  Pulmonary edema EXAM: PORTABLE CHEST 1 VIEW COMPARISON:  Chest radiograph 06/25/2022 FINDINGS: Right-sided central venous catheter with the tip in the mid SVC, unchanged. Right costophrenic angle is out of the field of view. Possible small left pleural effusion. No pneumothorax. Unchanged cardiac mediastinal contours. No focal airspace opacity. There are prominent bilateral interstitial opacities, which could represent pulmonary venous congestion or mild pulmonary edema. Degenerative changes of the right AC joint. No radiographically apparent displaced rib fractures. IMPRESSION: Prominent bilateral interstitial opacities, which could represent pulmonary venous congestion or mild pulmonary edema. Unchanged small left pleural effusion. Electronically Signed   By: Marin Roberts M.D.   On: 06/28/2022 06:54   DG Chest Port 1 View  Result Date: 06/25/2022 CLINICAL DATA:  Postoperative vascular dialysis catheter. EXAM: PORTABLE CHEST 1 VIEW COMPARISON:  06/24/2022 FINDINGS: Interval placement of a right central venous catheter with tip over the low SVC region. No pneumothorax. Mediastinal contours appear intact. Heart size and pulmonary vascularity are mildly prominent but improved since prior study. Small bilateral pleural effusions. IMPRESSION: 1. Right central venous catheter appears in appropriate radiographic position. No pneumothorax. 2. Cardiac enlargement with mild  vascular congestion, improved since prior study. Small bilateral pleural effusions. Electronically Signed   By: Lucienne Capers M.D.   On: 06/25/2022 18:53   DG C-Arm 1-60  Min-No Report  Result Date: 06/25/2022 Fluoroscopy was utilized by the requesting physician.  No radiographic interpretation.   ECHOCARDIOGRAM COMPLETE  Result Date: 06/24/2022    ECHOCARDIOGRAM REPORT   Patient Name:   Matthew Robles Date of Exam: 06/24/2022 Medical Rec #:  [id number]       Height:       72.0 in Accession #:    [id number]      Weight:       194.0 lb Date of Birth:  1956/01/28       BSA:          2.103 m Patient Age:    1 years        BP:           154/90 mmHg Patient Gender: M               HR:           86 bpm. Exam Location:  [facility] Procedure: 2D Echo, Cardiac Doppler and Color Doppler Indications:    Congestive Heart Failure I50.9  History:        Patient has no prior history of Echocardiogram examinations.                 CHF, Stroke; Risk Factors:Hypertension, Diabetes and Current                 Smoker. Acute respiratory failure with hypoxia, ARF (acute renal                 failure).  Sonographer:    Alvino Chapel RCS Referring Phys: [place]  1. Left ventricular ejection fraction, by estimation, is 45 to 50%. The left ventricle has mildly decreased function. The left ventricle demonstrates global hypokinesis. There is moderate concentric left ventricular hypertrophy. Left ventricular diastolic parameters are consistent with Grade II diastolic dysfunction (pseudonormalization).  2. Right ventricular systolic function is mildly reduced. The right ventricular size is normal. Tricuspid regurgitation signal is inadequate for assessing PA pressure.  3. Left atrial size was severely dilated.  4. Right atrial size was moderately dilated.  5. Left pleural effusion noted.  6. The mitral valve is degenerative. Mild mitral valve regurgitation.  7. The aortic valve is tricuspid. There is mild  calcification of the aortic valve. Aortic valve regurgitation is trivial. Aortic valve sclerosis/calcification is present, without any evidence of aortic stenosis.  8. The inferior vena cava is dilated in size with >50% respiratory variability, suggesting right atrial pressure of 8 mmHg. Comparison(s): Prior images unable to be directly viewed. FINDINGS  Left Ventricle: Left ventricular ejection fraction, by estimation, is 45 to 50%. The left ventricle has mildly decreased function. The left ventricle demonstrates global hypokinesis. The left ventricular internal cavity size was normal in size. There is  moderate concentric left ventricular hypertrophy. Left ventricular diastolic parameters are consistent with Grade II diastolic dysfunction (pseudonormalization). Right Ventricle: The right ventricular size is normal. No increase in right ventricular wall thickness. Right ventricular systolic function is mildly reduced. Tricuspid regurgitation signal is inadequate for assessing PA pressure. Left Atrium: Left atrial size was severely dilated. Right Atrium: Right atrial size was moderately dilated. Pericardium: Left pleural effusion noted. There is no evidence of pericardial effusion. Mitral Valve: The mitral valve is degenerative in appearance. There is mild calcification of the mitral valve leaflet(s). Mild to moderate mitral annular calcification. Mild mitral valve regurgitation. Tricuspid Valve: The tricuspid valve is grossly normal. Tricuspid valve regurgitation is trivial. Aortic  Valve: The aortic valve is tricuspid. There is mild calcification of the aortic valve. There is mild aortic valve annular calcification. Aortic valve regurgitation is trivial. Aortic valve sclerosis/calcification is present, without any evidence of aortic stenosis. Pulmonic Valve: The pulmonic valve was grossly normal. Pulmonic valve regurgitation is trivial. Aorta: The aortic root is normal in size and structure. Venous: The inferior  vena cava is dilated in size with greater than 50% respiratory variability, suggesting right atrial pressure of 8 mmHg. IAS/Shunts: No atrial level shunt detected by color flow Doppler.  LEFT VENTRICLE PLAX 2D LVIDd:         5.40 cm   Diastology LVIDs:         4.10 cm   LV e' medial:    6.42 cm/s LV PW:         1.40 cm   LV E/e' medial:  22.1 LV IVS:        1.40 cm   LV e' lateral:   8.26 cm/s LVOT diam:     2.00 cm   LV E/e' lateral: 17.2 LV SV:         55 LV SV Index:   26 LVOT Area:     3.14 cm  RIGHT VENTRICLE RV S prime:     14.50 cm/s TAPSE (M-mode): 2.4 cm LEFT ATRIUM              Index        RIGHT ATRIUM           Index LA diam:        4.60 cm  2.19 cm/m   RA Area:     28.20 cm LA Vol (A2C):   140.0 ml 66.57 ml/m  RA Volume:   100.00 ml 47.55 ml/m LA Vol (A4C):   105.0 ml 49.92 ml/m LA Biplane Vol: 122.0 ml 58.01 ml/m  AORTIC VALVE LVOT Vmax:   90.00 cm/s LVOT Vmean:  63.600 cm/s LVOT VTI:    0.174 m  AORTA Ao Root diam: 3.50 cm MITRAL VALVE MV Area (PHT): 5.66 cm     SHUNTS MV Decel Time: 134 msec     Systemic VTI:  0.17 m MV E velocity: 142.00 cm/s  Systemic Diam: 2.00 cm MV A velocity: 56.90 cm/s MV E/A ratio:  2.50 Rozann Lesches MD Electronically signed by Rozann Lesches MD Signature Date/Time: 06/24/2022/2:58:46 PM    Final    CT Renal Stone Study  Result Date: 06/24/2022 CLINICAL DATA:  Abdominal pain and flank pain with kidney stone suspected in a 67 year old male. EXAM: CT ABDOMEN AND PELVIS WITHOUT CONTRAST TECHNIQUE: Multidetector CT imaging of the abdomen and pelvis was performed following the standard protocol without IV contrast. RADIATION DOSE REDUCTION: This exam was performed according to the departmental dose-optimization program which includes automated exposure control, adjustment of the mA and/or kV according to patient size and/or use of iterative reconstruction technique. COMPARISON:  Renal sonogram from 2019. No recent abdominal imaging for comparison. FINDINGS: Lower  chest: Moderately large RIGHT and moderate LEFT pleural effusions. Septal thickening at the lung bases. Basilar volume loss/consolidation bilaterally. Heart size is enlarged. Low-attenuation cardiac blood pool. No pericardial effusion. Heart is incompletely imaged. No chest wall abnormality. Hepatobiliary: Smooth hepatic contours. No pericholecystic stranding. No gross biliary duct distension. No visible lesion on noncontrast imaging. Small amount of sludge in the gallbladder. Pancreas: Pancreas with normal contours, no signs of inflammation or peripancreatic fluid. Spleen: Normal. Adrenals/Urinary Tract: LEFT adrenal nodule measures 25 Hounsfield units and  is 12 x 11 mm. Lobular bilateral renal contours a represent fetal lobation or scarring. No nephrolithiasis. No hydronephrosis. Mild perinephric stranding. No substantial perivesical stranding. No ureteral calculi. Stomach/Bowel: Small hiatal hernia. No stranding adjacent to the stomach. No sign of small bowel obstruction. No sign of small bowel inflammation. Normal appendix. No colonic distension or inflammation. Vascular/Lymphatic: Aortic atherosclerosis. No sign of aneurysm. Smooth contour of the IVC. There is no gastrohepatic or hepatoduodenal ligament lymphadenopathy. No retroperitoneal or mesenteric lymphadenopathy. No pelvic sidewall lymphadenopathy. Calcified atherosclerotic changes are moderate is limited assessment of vascular structures due to lack of intravenous contrast. Reproductive: Unremarkable by CT. Other: Body wall edema. More pronounced periumbilical edema than other areas over the abdomen. Flank edema is symmetric. No substantial intra-abdominal fluid. No pneumoperitoneum. Musculoskeletal: No acute or destructive bone process. Mild spinal degenerative changes. IMPRESSION: 1. Suggestion of heart failure or volume overload in this patient with cardiomegaly, with mild to moderate anasarca associated with bilateral pleural effusions and pulmonary  edema. 2. Basilar airspace disease likely related to volume loss. Would also correlate with signs of infection. 3. More pronounced stranding about the umbilicus of uncertain significance, potentially related to underlying edema but given more concentrated stranding in this area would suggest correlation with any signs of infection/cellulitis. 4. No acute intra-abdominal or pelvic findings. No signs of nephrolithiasis or ureteral calculi. Normal appendix. 5. 1.2 cm left adrenal mass, probable benign adenoma. Recommend follow-up adrenal washout CT in 1 year. If stable for = 1 year, no further follow-up imaging. JACR 2017 Aug; 14(8):1038-44, JCAT 2016 Mar-Apr; 40(2):194-200, Urol J 2006 Spring; 3(2):71-4. Also consider the following. Based on current clinical literature, biochemical evaluation to exclude possible functioning adrenal nodule is suggested if not already performed. Please refer to current clinical guidelines for detailed recommendations. NEJM AE:[id number] 154-51. 6. Low-attenuation cardiac blood pool, can be seen in the setting of anemia. 7. Small hiatal hernia. 8. Aortic atherosclerosis. Aortic Atherosclerosis (ICD10-I70.0). Electronically Signed   By: Zetta Bills M.D.   On: 06/24/2022 08:28   DG Chest Portable 1 View  Result Date: 06/24/2022 CLINICAL DATA:  Evaluate for dyspnea.  History of CHF EXAM: PORTABLE CHEST 1 VIEW COMPARISON:  12/25/2020 FINDINGS: Chronic cardiomegaly. Small pleural effusions and bilateral airspace disease greater on the right. No pneumothorax. IMPRESSION: CHF pattern. Electronically Signed   By: Jorje Guild M.D.   On: 06/24/2022 05:48    Orson Eva, DO  [facility]  If 7PM-7AM, please contact night-coverage [email or web address] Password [facility] 07/02/2022, 5:45 PM   LOS: 8 days

## 2022-07-02 NOTE — Progress Notes (Signed)
2 opened pills found on the patient's bed, when having the patient's bed cleaned ,supposed to be Carvedilol and Amlodipine when googled, not sure when was the pills missed, MD made aware, will continue to monitor.

## 2022-07-02 NOTE — Progress Notes (Signed)
SATURATION QUALIFICATIONS: (This note is used to comply with regulatory documentation for home oxygen)  Patient Saturations on Room Air at Rest = 86%    Patient Saturations on 2 Liters of oxygen while Ambulating = 94%  Please briefly explain why patient needs home oxygen: pt is hypoxic while at rest on room air.

## 2022-07-02 NOTE — Progress Notes (Addendum)
Rounding Note    Patient Name: Matthew Robles Date of Encounter: 07/02/2022  [facility] Cardiologist: Carlyle Dolly, MD   Subjective   Patient seen on AM rounds. Denies any chest pain or shortness of breath. Plans to have hemodialysis completed today. Minimal urine output noted.   Inpatient Medications    Scheduled Meds:  sodium chloride   Intravenous Once   amLODipine  10 mg Oral Daily   calcitRIOL  0.5 mcg Oral Daily   carvedilol  25 mg Oral BID WC   Chlorhexidine Gluconate Cloth  6 each Topical Daily   Chlorhexidine Gluconate Cloth  6 each Topical Q0600   darbepoetin (ARANESP) injection - DIALYSIS  200 mcg Subcutaneous Q Mon-1800   furosemide  80 mg Intravenous Daily   heparin  5,000 Units Subcutaneous Q8H   insulin aspart  0-6 Units Subcutaneous TID WC   insulin aspart  2 Units Subcutaneous TID WC   sacubitril-valsartan  1 tablet Oral BID   spironolactone  25 mg Oral Daily   Continuous Infusions:  niCARDipine Stopped (06/30/22 1446)   PRN Meds: acetaminophen **OR** acetaminophen, albuterol, alteplase, bisacodyl, fentaNYL (SUBLIMAZE) injection, heparin, hydrALAZINE, labetalol, ondansetron **OR** ondansetron (ZOFRAN) IV, oxyCODONE, traZODone   Vital Signs    Vitals:   07/02/22 0600 07/02/22 0615 07/02/22 0630 07/02/22 0645  BP: (!) 172/63  (!) 168/81 (!) 172/80  Pulse: 80 87 81 83  Resp: 18 (!) 22 18 20  $ Temp:      TempSrc:      SpO2: 95% (!) 89% 94% 93%  Weight:      Height:        Intake/Output Summary (Last 24 hours) at 07/02/2022 0743 Last data filed at 07/02/2022 0300 Gross per 24 hour  Intake 493.72 ml  Output 800 ml  Net -306.28 ml      07/02/2022    5:00 AM 07/01/2022    5:00 AM 06/30/2022    5:00 PM  Last 3 Weights  Weight (lbs) 194 lb 7.1 oz 188 lb 11.4 oz 181 lb 7 oz  Weight (kg) 88.2 kg 85.6 kg 82.3 kg      Telemetry    Sinus rates of 70-80 with occasional unifocal PVC's - Personally Reviewed  ECG    No new tracings -  Personally Reviewed  Physical Exam   GEN: No acute distress.   Neck: No JVD Cardiac: RRR, no murmurs, rubs, or gallops.  Respiratory: Clear upper lobes with continued bibasilar crackles to auscultation bilaterally. Respirations remain unlabored at rest on 4L O2 via [place] GI: Soft, nontender, non-distended  MS: 1+ pitting edema to BLE; No deformity. SKIN: warm and dry, right upper chest temporary HD catheter intact with both ports clamped Neuro:  Nonfocal  Psych: Normal affect   Labs    High Sensitivity Troponin:   Recent Labs  Lab 06/24/22 0532  TROPONINIHS 76*     Chemistry Recent Labs  Lab 06/26/22 0411 06/27/22 0443 06/30/22 0346 07/01/22 0442 07/02/22 0530  NA 133*   < > 131* 133* 134*  K 3.3*   < > 3.2* 3.3* 3.2*  CL 102   < > 95* 97* 101  CO2 18*   < > 23 25 24  $ GLUCOSE 108*   < > 82 107* 143*  BUN 60*   < > 41* 30* 35*  CREATININE 10.26*   < > 7.80* 5.43* 6.62*  CALCIUM 6.7*   < > 6.7* 6.9* 6.9*  MG 2.2  --  2.0  --   --  ALBUMIN 1.8*   < > 1.8* 1.8* 1.6*  GFRNONAA 5*   < > 7* 11* 9*  ANIONGAP 13   < > 13 11 9   $ < > = values in this interval not displayed.    Lipids No results for input(s): "CHOL", "TRIG", "HDL", "LABVLDL", "LDLCALC", "CHOLHDL" in the last 168 hours.  Hematology Recent Labs  Lab 06/28/22 0419 06/29/22 0438 06/30/22 0346  WBC 10.2 10.3 11.1*  RBC 2.59* 2.71* 2.83*  HGB 7.8* 8.1* 8.5*  HCT 24.2* 25.1* 26.0*  MCV 93.4 92.6 91.9  MCH 30.1 29.9 30.0  MCHC 32.2 32.3 32.7  RDW 15.3 14.8 14.5  PLT 153 218 236   Thyroid No results for input(s): "TSH", "FREET4" in the last 168 hours.  BNPNo results for input(s): "BNP", "PROBNP" in the last 168 hours.  DDimer No results for input(s): "DDIMER" in the last 168 hours.   Radiology    No results found.  Cardiac Studies   TTE 06/24/22 1. Left ventricular ejection fraction, by estimation, is 45 to 50%. The  left ventricle has mildly decreased function. The left ventricle  demonstrates global  hypokinesis. There is moderate concentric left  ventricular hypertrophy. Left ventricular  diastolic parameters are consistent with Grade II diastolic dysfunction  (pseudonormalization).   2. Right ventricular systolic function is mildly reduced. The right  ventricular size is normal. Tricuspid regurgitation signal is inadequate  for assessing PA pressure.   3. Left atrial size was severely dilated.   4. Right atrial size was moderately dilated.   5. Left pleural effusion noted.   6. The mitral valve is degenerative. Mild mitral valve regurgitation.   7. The aortic valve is tricuspid. There is mild calcification of the  aortic valve. Aortic valve regurgitation is trivial. Aortic valve  sclerosis/calcification is present, without any evidence of aortic  stenosis.   8. The inferior vena cava is dilated in size with >50% respiratory  variability, suggesting right atrial pressure of 8 mmHg.   Patient Profile     67 y.o. male with a past medical history of chronic diastolic congestive heart failure, CKD than has progressed to ESRD on HD, hypertension, and previous CVA, who is being seen and evaluated for chronic diastolic congestive heart failure.   Assessment & Plan    HFmrEF -Echo revealed LVEF 45-50% -BNP 2536 -continued on coreg, furosemide, entresto, and spironlactone -SGLT2i contraindicated in HD patient -no current plans for ischemic work-up  - -300 mls output documented in the last 24 hours -heart failure education -daily weight, I&O's, low sodium diet -fluid removal per Nephrology  Hypertensive emergency that is slowly improving -blood pressure 172/80, slowly improving Continued on current medication regimen -vital signs per unit protocol  AKI with CKD now ESRD -serum creatinine 6.62 -right upper chest tunneled catheter for HD -patient stated he has HD today -fluid removal per HD -Nephrology continues to follow -avoid nephrotixic agents where able -continue to  monitor urine output  Acute respiratory failure with hypoxia -continued on 4L O2 via [place] -continue to titrate FiO2 to maintain O2 sats greater than equal to 92% -respiratory panel negative -continue with supportive care  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 -hgb stable at 8.5 -daily cbc -received blood transfusions x2 thus far -continued on iron infusions  Hypokalemia -serum potassium 3.2 -given 40 mEq -daily bmp -monitor/trend/replete electrolytes as needed -recommend keeping potassium >4 and >5  Type II diabetes -continued on insulin  -management per IM     For questions or  updates, please contact [place] Please consult [email or web address] for contact info under        Signed, SHERI HAMMOCK, NP  07/02/2022, 7:43 AM   Attending note  Patient seen and discussed with NP [place], I agree with her documentation  1.HTN emergency - prior admits with HTN emergency, poor medication compliance - prior normal renal artery [place] Jan 2020, 07/2018 no evidence of hyperaldo. Neg OSA screening questions.  - in ER bp 190s/110s, evidence of fluid overload - initially on NG drip, transitioned to nicardapine gtt now off    -medicaly therapy coreg 93m bid, norvasc 10, entresto 97/1017mbid, aldactone 25 - bp's remain elevated. Could use higher doses of coreg if needed  -remains hypertensive, increase coreg to 37.46m63mid. Next agent could consider hydralazine. Hemodyanmics also will be affected by ongoing HD and fluid removal.    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 Would treat medically and follow LV function. No plans for ischemic testing initially - volume management per nephrology and HD   - medical therapy with coreg 37.5  mg bid, entresto 97/103m4md, aldactone 25. SGLT2i contraindicated in HD patient.    4.Anemia - chronic, admit Hgb 8.4. Had been 8.1 last 2 years - likely related to kidney disease   JonaCarlyle Dolly

## 2022-07-02 NOTE — TOC Progression Note (Signed)
Transition of Care [facility]) - Progression Note    Patient Details  Name: Matthew Robles MRN: [id number] Date of Birth: [birth year]  [facility] CM/SW [place], [place] Phone Number: 07/02/2022, 10:47 AM  Clinical Narrative:    CSW updated that pt may need home O2 set up at D/C. CSW updated MD that we will need orders and sat qual prior to ordering. CSW spoke to [place] with [employer] who is aware and following for needs. TOC to follow.   Expected Discharge Plan: Home/Self Care Barriers to Discharge: Continued Medical Work up  Expected Discharge Plan and Services                                               Social Determinants of Health (SDOH) Interventions SDOH Screenings   Food Insecurity: No Food Insecurity (06/24/2022)  Housing: Low Risk  (06/24/2022)  Transportation Needs: No Transportation Needs (06/24/2022)  Utilities: Not At Risk (06/24/2022)  Tobacco Use: Medium Risk (07/01/2022)    Readmission Risk Interventions    06/29/2022   12:02 PM  Readmission Risk Prevention Plan  Transportation Screening Complete  HRI or [place] Complete  Social Work Consult for [place] Planning/Counseling Complete  Palliative Care Screening Not Applicable  Medication Review [employer]) Complete

## 2022-07-02 NOTE — Progress Notes (Signed)
Patient used BiPAP last night. Patient currently on 4 l/m HFNC with SpO2 of 96%. Patient decreased to  3 L/m, in effort to titrate him to his baseline of RA of possible.

## 2022-07-03 LAB — RENAL FUNCTION PANEL
Albumin: 1.9 g/dL — ABNORMAL LOW (ref 3.5–5.0)
Anion gap: 10 (ref 5–15)
BUN: 21 mg/dL (ref 8–23)
CO2: 26 mmol/L (ref 22–32)
Calcium: 7.3 mg/dL — ABNORMAL LOW (ref 8.9–10.3)
Chloride: 97 mmol/L — ABNORMAL LOW (ref 98–111)
Creatinine, Ser: 4.97 mg/dL — ABNORMAL HIGH (ref 0.61–1.24)
GFR, Estimated: 12 mL/min — ABNORMAL LOW (ref >60)
Glucose, Bld: 151 mg/dL — ABNORMAL HIGH (ref 70–99)
Phosphorus: 2.9 mg/dL (ref 2.5–4.6)
Potassium: 3.5 mmol/L (ref 3.5–5.1)
Sodium: 133 mmol/L — ABNORMAL LOW (ref 135–145)

## 2022-07-03 LAB — GLUCOSE, CAPILLARY
Glucose-Capillary: 89 mg/dL (ref 70–99)
Glucose-Capillary: 114 mg/dL — ABNORMAL HIGH (ref 70–99)
Glucose-Capillary: 135 mg/dL — ABNORMAL HIGH (ref 70–99)
Glucose-Capillary: 110 mg/dL — ABNORMAL HIGH (ref 70–99)
Glucose-Capillary: 98 mg/dL (ref 70–99)

## 2022-07-03 MED ORDER — GUAIFENESIN-DM 100-10 MG/5ML PO SYRP
5.0000 mL | ORAL_SOLUTION | ORAL | Status: DC | PRN
  Administered 2022-07-03 – 2022-07-04 (×2): 5 mL via ORAL
  Filled 2022-07-03 (×2): qty 5

## 2022-07-03 MED ORDER — HYDRALAZINE HCL 25 MG PO TABS
25.0000 mg | ORAL_TABLET | Freq: Three times a day (TID) | ORAL | Status: DC
  Administered 2022-07-03 – 2022-07-04 (×2): 25 mg via ORAL
  Filled 2022-07-03 (×2): qty 1

## 2022-07-03 NOTE — Progress Notes (Signed)
Patient has decline use of BIPAP this evening and wishes to use his cannula. The BIPAP unit is at bedside and informed patient if he becomes in distress or condition changes RT will place him on BIPAP. Patient made aware of care for this patient this evening and if anything changes to inform RT.

## 2022-07-03 NOTE — Progress Notes (Signed)
Per report patient used BiPAP last night. At 0830 he was on 2 L/m [place] ,having breakfast ,with SpO2 of 92%.

## 2022-07-03 NOTE — Progress Notes (Signed)
PROGRESS NOTE  Matthew Robles [id number] DOB: [birth year] DOA: 06/24/2022 PCP: Pcp, No  Brief History:  67 year old gentleman with longstanding hypertension since age 50, stage IV CKD, type 2 diabetes mellitus with neuropathy, gout, sleep disorder, history of stroke, congestive heart failure, anemia, chronic edema who established nephrology care with Dr. Trinda Pascal on 06/16/2021.  He apparently was lost to follow up.   He reported that over the past 2 weeks has had progressive symptoms of increasing edema in the lower extremities and poor urine output.  He reports that he noticed less than 2 ounces of urine in the past couple of days.  He is also had a cough and increasing shortness of breath that has worsened over the past 3 days.  He denies chest pain and palpitations.  He has noticed significant increase in the edema in the legs.  It is affecting his ambulation.  He also noticed increased abdominal distention from edema.  He reports that he has been taking his medications for blood pressure.  He reports that he takes amlodipine and hydralazine.  He does have a history of poor compliance with medications.     Patient presented to the emergency department with acute respiratory distress that woke him up this morning.  His pulse ox was 60%.  EMS had him on CPAP.  The ED placed him on BiPAP with improvement in symptoms.  His chest x-ray showed pulmonary edema and bilateral pleural effusions.  His BNP was markedly elevated.  He appears clinically volume overloaded.  He was given IV Lasix with no significant urine output.  His creatinine was elevated at 12.  His blood pressure was markedly elevated and he was placed on an IV nitroglycerin infusion.  Nephrology was consulted.  He is being admitted for management of hypertensive emergency.   Assessment/Plan: Hypertensive emergency  - pt presented with markedly elevated BP and AKI - now is off IV nitroglycerin (ineffective); started IV  nicardipine on 2/5   - HD treatments for volume removal  - weaned off nicardipine 2/7 evening - continue amlodipine, coreg, spiro, Entresto -anticipate continued improvement of BP with HD - increased coreg 37.5 mg bid - add hydralazine 25 mg tid   AKI on CKD stage IV--->now ESRD  - records indicate he was stage IV CKD 1 year ago - he is having poor urinary output at this time - foley catheter discontinued  - appreciate nephrology consultation - nephrology started HD 2/2 --TDC placed 2/2 - Daily renal function panel ordered - next HD on 2/9 - pt has HD chair at [facility] starting 2/13>>tentatively plan d/c after HD 2/10 if medically stable    Acute HFmrEF -06/24/22 echo--EF 45-50%, mild decrease RVF, G2DD, global HK -02/22/18 Echo EF 50-55%, no WMA, G2DD -transition metoprolol to carvedilol -consult cardiology--appreciated -Entresto and spiro started -SGLT2i contraindicated in HD patient    Non Anion Gap Metabolic Acidosis - resolved  - secondary to acute renal failure  - HD treatments started and acidosis has resolved   - nephrology team consulting    Acute respiratory failure with hypoxia  - secondary to pulmonary edema, Acute HFpEF - pt responded well to bipap therapy - he is now off bipap therapy but will leave for PRN treatment and QHS treatment - HD started  - RSV, Covid, Flu testing negative  -now stable on 2-3 L -has at least 30 pack years tobacco   Type 2 Diabetes Mellitus with nephropathy -06/24/22 A1C--5.9 -  continue novolog sliding scale -CBGs controlled   Leukocytosis - resolved - likley stress demargination - resolved    Hypokalemia - correcting on HD - supplementing judiciously - following daily renal function panel - improved   Bilateral Pleural Effusion  - secondary to volume overload  - treating with diuresis vs hemodialysis - hemodialysis started 06/25/22 per nephrology - remains fluid overloaded but much improved  - CXR showing improvement in  pulmonary edema     Anemia in CKD  - Hg down to 6.7 - transfused 1 unit PRBC on 06/24/22 - transfuse 1 unit PRBC with HD on 06/26/22 - follow up anemia panel - Hg improved to 8 after 2nd unit given on 2/3 - Hgb remains stable         Family Communication:  no Family at bedside   Consultants:  renal, cardiology   Code Status:  FULL   DVT Prophylaxis:  [place] Heparin     Procedures: As Listed in Progress Note Above   Antibiotics: None      Subjective: Pt complains of nonproductive cough.  Denies cp, sob, hemoptysis, n/v/d, abd pain  Objective: Vitals:   07/03/22 1644 07/03/22 1649 07/03/22 1700 07/03/22 1800  BP:  (!) 173/85 (!) 190/97 (!) 163/93  Pulse:  88 89 86  Resp:   18 (!) 22  Temp: 98.4 F (36.9 C)     TempSrc: Oral     SpO2:   91% 93%  Weight:      Height:        Intake/Output Summary (Last 24 hours) at 07/03/2022 1813 Last data filed at 07/03/2022 1300 Gross per 24 hour  Intake 480 ml  Output 4200 ml  Net -3720 ml   Weight change: -0.2 kg Exam:  General:  Pt is alert, follows commands appropriately, not in acute distress HEENT: No icterus, No thrush, No neck mass, [place]/AT Cardiovascular: RRR, S1/S2, no rubs, no gallops Respiratory: bibasilar rales. No wheeze Abdomen: Soft/+BS, non tender, non distended, no guarding Extremities: No edema, No lymphangitis, No petechiae, No rashes, no synovitis   Data Reviewed: I have personally reviewed following labs and imaging studies Basic Metabolic Panel: Recent Labs  Lab 06/29/22 0438 06/30/22 0346 07/01/22 0442 07/02/22 0530 07/03/22 0445  NA 131* 131* 133* 134* 133*  K 3.3* 3.2* 3.3* 3.2* 3.5  CL 98 95* 97* 101 97*  CO2 25 23 25 24 26  $ GLUCOSE 109* 82 107* 143* 151*  BUN 35* 41* 30* 35* 21  CREATININE 6.81* 7.80* 5.43* 6.62* 4.97*  CALCIUM 6.4* 6.7* 6.9* 6.9* 7.3*  MG  --  2.0  --   --   --   PHOS 4.7* 5.8* 3.8 4.1 2.9   Liver Function Tests: Recent Labs  Lab 06/29/22 0438 06/30/22 0346  07/01/22 0442 07/02/22 0530 07/03/22 0445  ALBUMIN 1.7* 1.8* 1.8* 1.6* 1.9*   No results for input(s): "LIPASE", "AMYLASE" in the last 168 hours. No results for input(s): "AMMONIA" in the last 168 hours. Coagulation Profile: No results for input(s): "INR", "PROTIME" in the last 168 hours. CBC: Recent Labs  Lab 06/27/22 0443 06/28/22 0419 06/29/22 0438 06/30/22 0346 07/02/22 1827  WBC 9.1 10.2 10.3 11.1* 12.6*  HGB 8.0* 7.8* 8.1* 8.5* 8.6*  HCT 24.6* 24.2* 25.1* 26.0* 26.9*  MCV 92.1 93.4 92.6 91.9 94.7  PLT 178 153 218 236 265   Cardiac Enzymes: No results for input(s): "CKTOTAL", "CKMB", "CKMBINDEX", "TROPONINI" in the last 168 hours. BNP: Invalid input(s): "POCBNP" CBG: Recent Labs  Lab 07/02/22 2211 07/03/22 0315 07/03/22 0740 07/03/22 1115 07/03/22 1642  GLUCAP 167* 89 114* 135* 110*   HbA1C: No results for input(s): "HGBA1C" in the last 72 hours. Urine analysis:    Component Value Date/Time   COLORURINE STRAW (A) 12/25/2020 2057   APPEARANCEUR CLEAR 12/25/2020 2057   LABSPEC 1.006 12/25/2020 2057   PHURINE 8.0 12/25/2020 2057   GLUCOSEU 50 (A) 12/25/2020 2057   HGBUR SMALL (A) 12/25/2020 2057   BILIRUBINUR NEGATIVE 12/25/2020 2057   KETONESUR NEGATIVE 12/25/2020 2057   PROTEINUR 30 (A) 12/25/2020 2057   NITRITE NEGATIVE 12/25/2020 2057   LEUKOCYTESUR NEGATIVE 12/25/2020 2057   Sepsis Labs: @LABRCNTIP$ (procalcitonin:4,lacticidven:4) ) Recent Results (from the past 240 hour(s))  Resp panel by RT-PCR (RSV, Flu A&B, Covid) Anterior Nasal Swab     Status: None   Collection Time: 06/24/22 10:12 AM   Specimen: Anterior Nasal Swab  Result Value Ref Range Status   SARS Coronavirus 2 by RT PCR NEGATIVE NEGATIVE Final    Comment: (NOTE) SARS-CoV-2 target nucleic acids are NOT DETECTED.  The SARS-CoV-2 RNA is generally detectable in upper respiratory specimens during the acute phase of infection. The lowest concentration of SARS-CoV-2 viral copies this  assay can detect is 138 copies/mL. A negative result does not preclude SARS-Cov-2 infection and should not be used as the sole basis for treatment or other patient management decisions. A negative result may occur with  improper specimen collection/handling, submission of specimen other than nasopharyngeal swab, presence of viral mutation(s) within the areas targeted by this assay, and inadequate number of viral copies(<138 copies/mL). A negative result must be combined with clinical observations, patient history, and epidemiological information. The expected result is Negative.  Fact Sheet for Patients:  EntrepreneurPulse.com.au  Fact Sheet for Healthcare Providers:  IncredibleEmployment.be  This test is no t yet approved or cleared by the [place] FDA and  has been authorized for detection and/or diagnosis of SARS-CoV-2 by FDA under an Emergency Use Authorization (EUA). This EUA will remain  in effect (meaning this test can be used) for the duration of the COVID-19 declaration under Section 564(b)(1) of the Act, 21 U.S.C.section 360bbb-3(b)(1), unless the authorization is terminated  or revoked sooner.       Influenza A by PCR NEGATIVE NEGATIVE Final   Influenza B by PCR NEGATIVE NEGATIVE Final    Comment: (NOTE) The Xpert Xpress SARS-CoV-2/FLU/RSV plus assay is intended as an aid in the diagnosis of influenza from Nasopharyngeal swab specimens and should not be used as a sole basis for treatment. Nasal washings and aspirates are unacceptable for Xpert Xpress SARS-CoV-2/FLU/RSV testing.  Fact Sheet for Patients: EntrepreneurPulse.com.au  Fact Sheet for Healthcare Providers: IncredibleEmployment.be  This test is not yet approved or cleared by the [place] FDA and has been authorized for detection and/or diagnosis of SARS-CoV-2 by FDA under an Emergency Use Authorization (EUA). This EUA will  remain in effect (meaning this test can be used) for the duration of the COVID-19 declaration under Section 564(b)(1) of the Act, 21 U.S.C. section 360bbb-3(b)(1), unless the authorization is terminated or revoked.     Resp Syncytial Virus by PCR NEGATIVE NEGATIVE Final    Comment: (NOTE) Fact Sheet for Patients: EntrepreneurPulse.com.au  Fact Sheet for Healthcare Providers: IncredibleEmployment.be  This test is not yet approved or cleared by the [place] FDA and has been authorized for detection and/or diagnosis of SARS-CoV-2 by FDA under an Emergency Use Authorization (EUA). This EUA will remain in effect (meaning this test  can be used) for the duration of the COVID-19 declaration under Section 564(b)(1) of the Act, 21 U.S.C. section 360bbb-3(b)(1), unless the authorization is terminated or revoked.  Performed at [facility], [address] [place], [place] [address]   MRSA Next Gen by PCR, Nasal     Status: None   Collection Time: 06/24/22 10:12 AM   Specimen: Anterior Nasal Swab  Result Value Ref Range Status   MRSA by PCR Next Gen NOT DETECTED NOT DETECTED Final    Comment: (NOTE) The GeneXpert MRSA Assay (FDA approved for NASAL specimens only), is one component of a comprehensive MRSA colonization surveillance program. It is not intended to diagnose MRSA infection nor to guide or monitor treatment for MRSA infections. Test performance is not FDA approved in patients less than 64 years old. Performed at [facility], [address], [place], [place] [address]      Scheduled Meds:  sodium chloride   Intravenous Once   amLODipine  10 mg Oral Daily   calcitRIOL  0.5 mcg Oral Daily   carvedilol  37.5 mg Oral BID WC   Chlorhexidine Gluconate Cloth  6 each Topical Daily   Chlorhexidine Gluconate Cloth  6 each Topical Q0600   darbepoetin (ARANESP) injection - DIALYSIS  200 mcg Subcutaneous Q Mon-1800   furosemide  80 mg Intravenous  Daily   heparin  5,000 Units Subcutaneous Q8H   hydrALAZINE  25 mg Oral Q8H   insulin aspart  0-6 Units Subcutaneous TID WC   insulin aspart  2 Units Subcutaneous TID WC   sacubitril-valsartan  1 tablet Oral BID   spironolactone  25 mg Oral Daily   Continuous Infusions:  niCARDipine Stopped (06/30/22 1446)    Procedures/Studies: DG CHEST PORT 1 VIEW  Result Date: 06/28/2022 CLINICAL DATA:  Pulmonary edema EXAM: PORTABLE CHEST 1 VIEW COMPARISON:  Chest radiograph 06/25/2022 FINDINGS: Right-sided central venous catheter with the tip in the mid SVC, unchanged. Right costophrenic angle is out of the field of view. Possible small left pleural effusion. No pneumothorax. Unchanged cardiac mediastinal contours. No focal airspace opacity. There are prominent bilateral interstitial opacities, which could represent pulmonary venous congestion or mild pulmonary edema. Degenerative changes of the right AC joint. No radiographically apparent displaced rib fractures. IMPRESSION: Prominent bilateral interstitial opacities, which could represent pulmonary venous congestion or mild pulmonary edema. Unchanged small left pleural effusion. Electronically Signed   By: Marin Roberts M.D.   On: 06/28/2022 06:54   DG Chest Port 1 View  Result Date: 06/25/2022 CLINICAL DATA:  Postoperative vascular dialysis catheter. EXAM: PORTABLE CHEST 1 VIEW COMPARISON:  06/24/2022 FINDINGS: Interval placement of a right central venous catheter with tip over the low SVC region. No pneumothorax. Mediastinal contours appear intact. Heart size and pulmonary vascularity are mildly prominent but improved since prior study. Small bilateral pleural effusions. IMPRESSION: 1. Right central venous catheter appears in appropriate radiographic position. No pneumothorax. 2. Cardiac enlargement with mild vascular congestion, improved since prior study. Small bilateral pleural effusions. Electronically Signed   By: Lucienne Capers M.D.   On: 06/25/2022  18:53   DG C-Arm 1-60 Min-No Report  Result Date: 06/25/2022 Fluoroscopy was utilized by the requesting physician.  No radiographic interpretation.   ECHOCARDIOGRAM COMPLETE  Result Date: 06/24/2022    ECHOCARDIOGRAM REPORT   Patient Name:   DEMARKUS USCANGA Date of Exam: 06/24/2022 Medical Rec #:  [id number]       Height:       72.0 in Accession #:  [id number]      Weight:       194.0 lb Date of Birth:  Nov 10, 1955       BSA:          2.103 m Patient Age:    64 years        BP:           154/90 mmHg Patient Gender: M               HR:           86 bpm. Exam Location:  [facility] Procedure: 2D Echo, Cardiac Doppler and Color Doppler Indications:    Congestive Heart Failure I50.9  History:        Patient has no prior history of Echocardiogram examinations.                 CHF, Stroke; Risk Factors:Hypertension, Diabetes and Current                 Smoker. Acute respiratory failure with hypoxia, ARF (acute renal                 failure).  Sonographer:    Alvino Chapel RCS Referring Phys: [employer]  1. Left ventricular ejection fraction, by estimation, is 45 to 50%. The left ventricle has mildly decreased function. The left ventricle demonstrates global hypokinesis. There is moderate concentric left ventricular hypertrophy. Left ventricular diastolic parameters are consistent with Grade II diastolic dysfunction (pseudonormalization).  2. Right ventricular systolic function is mildly reduced. The right ventricular size is normal. Tricuspid regurgitation signal is inadequate for assessing PA pressure.  3. Left atrial size was severely dilated.  4. Right atrial size was moderately dilated.  5. Left pleural effusion noted.  6. The mitral valve is degenerative. Mild mitral valve regurgitation.  7. The aortic valve is tricuspid. There is mild calcification of the aortic valve. Aortic valve regurgitation is trivial. Aortic valve sclerosis/calcification is present, without any evidence of aortic  stenosis.  8. The inferior vena cava is dilated in size with >50% respiratory variability, suggesting right atrial pressure of 8 mmHg. Comparison(s): Prior images unable to be directly viewed. FINDINGS  Left Ventricle: Left ventricular ejection fraction, by estimation, is 45 to 50%. The left ventricle has mildly decreased function. The left ventricle demonstrates global hypokinesis. The left ventricular internal cavity size was normal in size. There is  moderate concentric left ventricular hypertrophy. Left ventricular diastolic parameters are consistent with Grade II diastolic dysfunction (pseudonormalization). Right Ventricle: The right ventricular size is normal. No increase in right ventricular wall thickness. Right ventricular systolic function is mildly reduced. Tricuspid regurgitation signal is inadequate for assessing PA pressure. Left Atrium: Left atrial size was severely dilated. Right Atrium: Right atrial size was moderately dilated. Pericardium: Left pleural effusion noted. There is no evidence of pericardial effusion. Mitral Valve: The mitral valve is degenerative in appearance. There is mild calcification of the mitral valve leaflet(s). Mild to moderate mitral annular calcification. Mild mitral valve regurgitation. Tricuspid Valve: The tricuspid valve is grossly normal. Tricuspid valve regurgitation is trivial. Aortic Valve: The aortic valve is tricuspid. There is mild calcification of the aortic valve. There is mild aortic valve annular calcification. Aortic valve regurgitation is trivial. Aortic valve sclerosis/calcification is present, without any evidence of aortic stenosis. Pulmonic Valve: The pulmonic valve was grossly normal. Pulmonic valve regurgitation is trivial. Aorta: The aortic root is normal in size and structure. Venous: The inferior vena cava is dilated  in size with greater than 50% respiratory variability, suggesting right atrial pressure of 8 mmHg. IAS/Shunts: No atrial level shunt  detected by color flow Doppler.  LEFT VENTRICLE PLAX 2D LVIDd:         5.40 cm   Diastology LVIDs:         4.10 cm   LV e' medial:    6.42 cm/s LV PW:         1.40 cm   LV E/e' medial:  22.1 LV IVS:        1.40 cm   LV e' lateral:   8.26 cm/s LVOT diam:     2.00 cm   LV E/e' lateral: 17.2 LV SV:         55 LV SV Index:   26 LVOT Area:     3.14 cm  RIGHT VENTRICLE RV S prime:     14.50 cm/s TAPSE (M-mode): 2.4 cm LEFT ATRIUM              Index        RIGHT ATRIUM           Index LA diam:        4.60 cm  2.19 cm/m   RA Area:     28.20 cm LA Vol (A2C):   140.0 ml 66.57 ml/m  RA Volume:   100.00 ml 47.55 ml/m LA Vol (A4C):   105.0 ml 49.92 ml/m LA Biplane Vol: 122.0 ml 58.01 ml/m  AORTIC VALVE LVOT Vmax:   90.00 cm/s LVOT Vmean:  63.600 cm/s LVOT VTI:    0.174 m  AORTA Ao Root diam: 3.50 cm MITRAL VALVE MV Area (PHT): 5.66 cm     SHUNTS MV Decel Time: 134 msec     Systemic VTI:  0.17 m MV E velocity: 142.00 cm/s  Systemic Diam: 2.00 cm MV A velocity: 56.90 cm/s MV E/A ratio:  2.50 Rozann Lesches MD Electronically signed by Rozann Lesches MD Signature Date/Time: 06/24/2022/2:58:46 PM    Final    CT Renal Stone Study  Result Date: 06/24/2022 CLINICAL DATA:  Abdominal pain and flank pain with kidney stone suspected in a 67 year old male. EXAM: CT ABDOMEN AND PELVIS WITHOUT CONTRAST TECHNIQUE: Multidetector CT imaging of the abdomen and pelvis was performed following the standard protocol without IV contrast. RADIATION DOSE REDUCTION: This exam was performed according to the departmental dose-optimization program which includes automated exposure control, adjustment of the mA and/or kV according to patient size and/or use of iterative reconstruction technique. COMPARISON:  Renal sonogram from 2019. No recent abdominal imaging for comparison. FINDINGS: Lower chest: Moderately large RIGHT and moderate LEFT pleural effusions. Septal thickening at the lung bases. Basilar volume loss/consolidation bilaterally. Heart  size is enlarged. Low-attenuation cardiac blood pool. No pericardial effusion. Heart is incompletely imaged. No chest wall abnormality. Hepatobiliary: Smooth hepatic contours. No pericholecystic stranding. No gross biliary duct distension. No visible lesion on noncontrast imaging. Small amount of sludge in the gallbladder. Pancreas: Pancreas with normal contours, no signs of inflammation or peripancreatic fluid. Spleen: Normal. Adrenals/Urinary Tract: LEFT adrenal nodule measures 25 Hounsfield units and is 12 x 11 mm. Lobular bilateral renal contours a represent fetal lobation or scarring. No nephrolithiasis. No hydronephrosis. Mild perinephric stranding. No substantial perivesical stranding. No ureteral calculi. Stomach/Bowel: Small hiatal hernia. No stranding adjacent to the stomach. No sign of small bowel obstruction. No sign of small bowel inflammation. Normal appendix. No colonic distension or inflammation. Vascular/Lymphatic: Aortic atherosclerosis. No sign of aneurysm. Smooth contour  of the IVC. There is no gastrohepatic or hepatoduodenal ligament lymphadenopathy. No retroperitoneal or mesenteric lymphadenopathy. No pelvic sidewall lymphadenopathy. Calcified atherosclerotic changes are moderate is limited assessment of vascular structures due to lack of intravenous contrast. Reproductive: Unremarkable by CT. Other: Body wall edema. More pronounced periumbilical edema than other areas over the abdomen. Flank edema is symmetric. No substantial intra-abdominal fluid. No pneumoperitoneum. Musculoskeletal: No acute or destructive bone process. Mild spinal degenerative changes. IMPRESSION: 1. Suggestion of heart failure or volume overload in this patient with cardiomegaly, with mild to moderate anasarca associated with bilateral pleural effusions and pulmonary edema. 2. Basilar airspace disease likely related to volume loss. Would also correlate with signs of infection. 3. More pronounced stranding about the  umbilicus of uncertain significance, potentially related to underlying edema but given more concentrated stranding in this area would suggest correlation with any signs of infection/cellulitis. 4. No acute intra-abdominal or pelvic findings. No signs of nephrolithiasis or ureteral calculi. Normal appendix. 5. 1.2 cm left adrenal mass, probable benign adenoma. Recommend follow-up adrenal washout CT in 1 year. If stable for = 1 year, no further follow-up imaging. JACR 2017 Aug; 14(8):1038-44, JCAT 2016 Mar-Apr; 40(2):194-200, Urol J 2006 Spring; 3(2):71-4. Also consider the following. Based on current clinical literature, biochemical evaluation to exclude possible functioning adrenal nodule is suggested if not already performed. Please refer to current clinical guidelines for detailed recommendations. NEJM OO:[id number] 154-51. 6. Low-attenuation cardiac blood pool, can be seen in the setting of anemia. 7. Small hiatal hernia. 8. Aortic atherosclerosis. Aortic Atherosclerosis (ICD10-I70.0). Electronically Signed   By: Zetta Bills M.D.   On: 06/24/2022 08:28   DG Chest Portable 1 View  Result Date: 06/24/2022 CLINICAL DATA:  Evaluate for dyspnea.  History of CHF EXAM: PORTABLE CHEST 1 VIEW COMPARISON:  12/25/2020 FINDINGS: Chronic cardiomegaly. Small pleural effusions and bilateral airspace disease greater on the right. No pneumothorax. IMPRESSION: CHF pattern. Electronically Signed   By: Jorje Guild M.D.   On: 06/24/2022 05:48    Orson Eva, DO  [facility]  If 7PM-7AM, please contact night-coverage [email or web address] Password [facility] 07/03/2022, 6:13 PM   LOS: 9 days

## 2022-07-04 ENCOUNTER — Inpatient Hospital Stay (HOSPITAL_COMMUNITY): Payer: Self-pay

## 2022-07-04 LAB — GLUCOSE, CAPILLARY
Glucose-Capillary: 96 mg/dL (ref 70–99)
Glucose-Capillary: 97 mg/dL (ref 70–99)
Glucose-Capillary: 133 mg/dL — ABNORMAL HIGH (ref 70–99)
Glucose-Capillary: 139 mg/dL — ABNORMAL HIGH (ref 70–99)
Glucose-Capillary: 123 mg/dL — ABNORMAL HIGH (ref 70–99)

## 2022-07-04 LAB — RENAL FUNCTION PANEL
Albumin: 1.8 g/dL — ABNORMAL LOW (ref 3.5–5.0)
Anion gap: 12 (ref 5–15)
BUN: 29 mg/dL — ABNORMAL HIGH (ref 8–23)
CO2: 23 mmol/L (ref 22–32)
Calcium: 7.3 mg/dL — ABNORMAL LOW (ref 8.9–10.3)
Chloride: 98 mmol/L (ref 98–111)
Creatinine, Ser: 6.18 mg/dL — ABNORMAL HIGH (ref 0.61–1.24)
GFR, Estimated: 9 mL/min — ABNORMAL LOW (ref >60)
Glucose, Bld: 107 mg/dL — ABNORMAL HIGH (ref 70–99)
Phosphorus: 3.5 mg/dL (ref 2.5–4.6)
Potassium: 3.7 mmol/L (ref 3.5–5.1)
Sodium: 133 mmol/L — ABNORMAL LOW (ref 135–145)

## 2022-07-04 MED ORDER — FUROSEMIDE 10 MG/ML IJ SOLN
60.0000 mg | Freq: Once | INTRAMUSCULAR | Status: AC
  Administered 2022-07-04: 60 mg via INTRAVENOUS
  Filled 2022-07-04: qty 6

## 2022-07-04 MED ORDER — HYDRALAZINE HCL 25 MG PO TABS
50.0000 mg | ORAL_TABLET | Freq: Three times a day (TID) | ORAL | Status: DC
  Administered 2022-07-04 – 2022-07-05 (×3): 50 mg via ORAL
  Filled 2022-07-04 (×3): qty 2

## 2022-07-04 MED ORDER — ISOSORBIDE MONONITRATE ER 30 MG PO TB24
30.0000 mg | ORAL_TABLET | Freq: Every day | ORAL | Status: DC
  Administered 2022-07-04 – 2022-07-05 (×2): 30 mg via ORAL
  Filled 2022-07-04 (×2): qty 1

## 2022-07-04 NOTE — Progress Notes (Signed)
Patient has chosen to wear BIPAP at this time but unit is at bedside if needed and RT will place on patient.

## 2022-07-04 NOTE — Progress Notes (Signed)
[facility] night cross cover note:   I was notified by RN the patient is complaining of some shortness of breath as well as cough this morning.  He continues to maintain oxygen saturation's in the high 90s on 2 L nasal cannula, unchanged from earlier in the evening.  Other vital signs are stable.  He is reportedly end-stage renal disease patient on hemodialysis, but continues to produce urine.  RT is evaluated patient at this time, the patient will be receiving a breathing treatment shortly.  I have ordered an additional dose of IV Lasix and also a chest x-ray.  No report of any associated chest pain.     Babs Bertin, DO Hospitalist

## 2022-07-04 NOTE — Progress Notes (Signed)
SATURATION QUALIFICATIONS: (This note is used to comply with regulatory documentation for home oxygen)   Patient Saturations on Room Air at Rest = 86   Patient Saturations on Room Air while Ambulating = N/A   Patient Saturations on 2 Liters of oxygen while Ambulating = 95   Please briefly explain why patient needs home oxygen: To maintain 02 sat at 90% or above during ambulation.  Orson Eva, DO

## 2022-07-04 NOTE — Progress Notes (Signed)
PROGRESS NOTE  Matthew Robles [id number] DOB: May 23, 1956 DOA: 06/24/2022 PCP: Pcp, No  Brief History:  67 year old gentleman with longstanding hypertension since age 62, stage IV CKD, type 2 diabetes mellitus with neuropathy, gout, sleep disorder, history of stroke, congestive heart failure, anemia, chronic edema who established nephrology care with Dr. Trinda Pascal on 06/16/2021.  He apparently was lost to follow up.   He reported that over the past 2 weeks has had progressive symptoms of increasing edema in the lower extremities and poor urine output.  He reports that he noticed less than 2 ounces of urine in the past couple of days.  He is also had a cough and increasing shortness of breath that has worsened over the past 3 days.  He denies chest pain and palpitations.  He has noticed significant increase in the edema in the legs.  It is affecting his ambulation.  He also noticed increased abdominal distention from edema.  He reports that he has been taking his medications for blood pressure.  He reports that he takes amlodipine and hydralazine.  He does have a history of poor compliance with medications.     Patient presented to the emergency department with acute respiratory distress that woke him up this morning.  His pulse ox was 60%.  EMS had him on CPAP.  The ED placed him on BiPAP with improvement in symptoms.  His chest x-ray showed pulmonary edema and bilateral pleural effusions.  His BNP was markedly elevated.  He appears clinically volume overloaded.  He was given IV Lasix with no significant urine output.  His creatinine was elevated at 12.  His blood pressure was markedly elevated and he was placed on an IV nitroglycerin infusion.  Nephrology was consulted.  He is being admitted for management of hypertensive emergency.   Assessment/Plan: Hypertensive emergency  - pt presented with markedly elevated BP and AKI - now is off IV nitroglycerin (ineffective); started IV  nicardipine on 2/5   - HD treatments for volume removal  - weaned off nicardipine 2/7 evening - continue amlodipine, coreg, spiro, Entresto -anticipate continued improvement of BP with HD - increased coreg 37.5 mg bid - increase hydralazine 50 mg tid -add imdur   AKI on CKD stage IV--->now ESRD  - records indicate he was stage IV CKD 1 year ago - he is having poor urinary output at this time - foley catheter discontinued  - appreciate nephrology consultation - nephrology started HD 2/2 --TDC placed 2/2 - Daily renal function panel ordered - next HD on 2/12 - pt has HD chair at [facility] starting 2/13>>tentatively plan d/c after HD 2/10 if medically stable    Acute HFmrEF -06/24/22 echo--EF 45-50%, mild decrease RVF, G2DD, global HK -02/22/18 Echo EF 50-55%, no WMA, G2DD -transition metoprolol to carvedilol -consult cardiology--appreciated -Entresto and spiro started -SGLT2i contraindicated in HD patient    Non Anion Gap Metabolic Acidosis - resolved  - secondary to acute renal failure  - HD treatments started and acidosis has resolved   - nephrology team consulting    Acute respiratory failure with hypoxia  - secondary to pulmonary edema, Acute HFpEF - pt responded well to bipap therapy - he is now off bipap therapy but will leave for PRN treatment and QHS treatment - HD started  - RSV, Covid, Flu testing negative  -now stable on 2-3 L -has at least 30 pack years tobacco -will need to go home with 2L [place]  Type 2 Diabetes Mellitus with nephropathy -06/24/22 A1C--5.9 -continue novolog sliding scale -CBGs controlled   Leukocytosis - resolved - likley stress demargination - resolved    Hypokalemia - correcting on HD - supplementing judiciously - following daily renal function panel - improved   Bilateral Pleural Effusion  - secondary to volume overload  - treating with diuresis vs hemodialysis - hemodialysis started 06/25/22 per nephrology - remains fluid overloaded but  much improved  - CXR showing improvement in pulmonary edema     Anemia in CKD  - Hg down to 6.7 - transfused 1 unit PRBC on 06/24/22 - transfuse 1 unit PRBC with HD on 06/26/22 - follow up anemia panel - Hg improved to 8 after 2nd unit given on 2/3 - Hgb remains stable         Family Communication:  no Family at bedside   Consultants:  renal, cardiology   Code Status:  FULL   DVT Prophylaxis:  [place] Heparin     Procedures: As Listed in Progress Note Above   Antibiotics: None        Subjective: Patient denies fevers, chills, headache, chest pain, dyspnea, nausea, vomiting, diarrhea, abdominal pain, dysuria, hematuria, hematochezia, and melena.   Objective: Vitals:   07/04/22 1639 07/04/22 1643 07/04/22 1700 07/04/22 1701  BP:   (!) 189/91   Pulse: 80 80  90  Resp: 20 (!) 22  18  Temp:  98.2 F (36.8 C)    TempSrc:  Oral    SpO2: 97% 97%  97%  Weight:      Height:        Intake/Output Summary (Last 24 hours) at 07/04/2022 1736 Last data filed at 07/04/2022 1125 Gross per 24 hour  Intake --  Output 650 ml  Net -650 ml   Weight change: -5.2 kg Exam:  General:  Pt is alert, follows commands appropriately, not in acute distress HEENT: No icterus, No thrush, No neck mass, [place]/AT Cardiovascular: RRR, S1/S2, no rubs, no gallops Respiratory: bibasilar rales. No wheeze Abdomen: Soft/+BS, non tender, non distended, no guarding Extremities: No edema, No lymphangitis, No petechiae, No rashes, no synovitis   Data Reviewed: I have personally reviewed following labs and imaging studies Basic Metabolic Panel: Recent Labs  Lab 06/30/22 0346 07/01/22 0442 07/02/22 0530 07/03/22 0445 07/04/22 0343  NA 131* 133* 134* 133* 133*  K 3.2* 3.3* 3.2* 3.5 3.7  CL 95* 97* 101 97* 98  CO2 23 25 24 26 23  $ GLUCOSE 82 107* 143* 151* 107*  BUN 41* 30* 35* 21 29*  CREATININE 7.80* 5.43* 6.62* 4.97* 6.18*  CALCIUM 6.7* 6.9* 6.9* 7.3* 7.3*  MG 2.0  --   --   --   --   PHOS  5.8* 3.8 4.1 2.9 3.5   Liver Function Tests: Recent Labs  Lab 06/30/22 0346 07/01/22 0442 07/02/22 0530 07/03/22 0445 07/04/22 0343  ALBUMIN 1.8* 1.8* 1.6* 1.9* 1.8*   No results for input(s): "LIPASE", "AMYLASE" in the last 168 hours. No results for input(s): "AMMONIA" in the last 168 hours. Coagulation Profile: No results for input(s): "INR", "PROTIME" in the last 168 hours. CBC: Recent Labs  Lab 06/28/22 0419 06/29/22 0438 06/30/22 0346 07/02/22 1827  WBC 10.2 10.3 11.1* 12.6*  HGB 7.8* 8.1* 8.5* 8.6*  HCT 24.2* 25.1* 26.0* 26.9*  MCV 93.4 92.6 91.9 94.7  PLT 153 218 236 265   Cardiac Enzymes: No results for input(s): "CKTOTAL", "CKMB", "CKMBINDEX", "TROPONINI" in the last 168 hours. BNP: Invalid  input(s): "POCBNP" CBG: Recent Labs  Lab 07/03/22 2033 07/04/22 0247 07/04/22 0748 07/04/22 1151 07/04/22 1642  GLUCAP 98 96 97 133* 139*   HbA1C: No results for input(s): "HGBA1C" in the last 72 hours. Urine analysis:    Component Value Date/Time   COLORURINE STRAW (A) 12/25/2020 2057   APPEARANCEUR CLEAR 12/25/2020 2057   LABSPEC 1.006 12/25/2020 2057   PHURINE 8.0 12/25/2020 2057   GLUCOSEU 50 (A) 12/25/2020 2057   HGBUR SMALL (A) 12/25/2020 2057   BILIRUBINUR NEGATIVE 12/25/2020 2057   [place] NEGATIVE 12/25/2020 2057   PROTEINUR 30 (A) 12/25/2020 2057   NITRITE NEGATIVE 12/25/2020 2057   LEUKOCYTESUR NEGATIVE 12/25/2020 2057   Sepsis Labs: @LABRCNTIP$ (procalcitonin:4,lacticidven:4) )No results found for this or any previous visit (from the past 240 hour(s)).   Scheduled Meds:  sodium chloride   Intravenous Once   amLODipine  10 mg Oral Daily   calcitRIOL  0.5 mcg Oral Daily   carvedilol  37.5 mg Oral BID WC   Chlorhexidine Gluconate Cloth  6 each Topical Daily   Chlorhexidine Gluconate Cloth  6 each Topical Q0600   darbepoetin (ARANESP) injection - DIALYSIS  200 mcg Subcutaneous Q Mon-1800   furosemide  80 mg Intravenous Daily   heparin  5,000  Units Subcutaneous Q8H   hydrALAZINE  50 mg Oral Q8H   insulin aspart  0-6 Units Subcutaneous TID WC   insulin aspart  2 Units Subcutaneous TID WC   isosorbide mononitrate  30 mg Oral Daily   sacubitril-valsartan  1 tablet Oral BID   spironolactone  25 mg Oral Daily   Continuous Infusions:  Procedures/Studies: DG Chest Port 1 View  Result Date: 07/04/2022 CLINICAL DATA:  Shortness of breath. EXAM: PORTABLE CHEST 1 VIEW COMPARISON:  06/28/2022 FINDINGS: 0540 hours. The lungs are clear without focal pneumonia, edema, pneumothorax or pleural effusion. Cardiopericardial silhouette is at upper limits of normal for size. Retrocardiac left base collapse/consolidation is similar to mildly improved in the interval. Tiny bilateral pleural effusions evident. Right central line tip overlies the mid SVC level. Telemetry leads overlie the chest. IMPRESSION: 1. Similar to mildly improved retrocardiac left base collapse/consolidation. 2. Tiny bilateral pleural effusions. Electronically Signed   By: Misty Stanley M.D.   On: 07/04/2022 07:57   DG CHEST PORT 1 VIEW  Result Date: 06/28/2022 CLINICAL DATA:  Pulmonary edema EXAM: PORTABLE CHEST 1 VIEW COMPARISON:  Chest radiograph 06/25/2022 FINDINGS: Right-sided central venous catheter with the tip in the mid SVC, unchanged. Right costophrenic angle is out of the field of view. Possible small left pleural effusion. No pneumothorax. Unchanged cardiac mediastinal contours. No focal airspace opacity. There are prominent bilateral interstitial opacities, which could represent pulmonary venous congestion or mild pulmonary edema. Degenerative changes of the right AC joint. No radiographically apparent displaced rib fractures. IMPRESSION: Prominent bilateral interstitial opacities, which could represent pulmonary venous congestion or mild pulmonary edema. Unchanged small left pleural effusion. Electronically Signed   By: Marin Roberts M.D.   On: 06/28/2022 06:54   DG Chest  Port 1 View  Result Date: 06/25/2022 CLINICAL DATA:  Postoperative vascular dialysis catheter. EXAM: PORTABLE CHEST 1 VIEW COMPARISON:  06/24/2022 FINDINGS: Interval placement of a right central venous catheter with tip over the low SVC region. No pneumothorax. Mediastinal contours appear intact. Heart size and pulmonary vascularity are mildly prominent but improved since prior study. Small bilateral pleural effusions. IMPRESSION: 1. Right central venous catheter appears in appropriate radiographic position. No pneumothorax. 2. Cardiac enlargement with mild vascular congestion,  improved since prior study. Small bilateral pleural effusions. Electronically Signed   By: Lucienne Capers M.D.   On: 06/25/2022 18:53   DG C-Arm 1-60 Min-No Report  Result Date: 06/25/2022 Fluoroscopy was utilized by the requesting physician.  No radiographic interpretation.   ECHOCARDIOGRAM COMPLETE  Result Date: 06/24/2022    ECHOCARDIOGRAM REPORT   Patient Name:   Matthew Robles Date of Exam: 06/24/2022 Medical Rec #:  [id number]       Height:       72.0 in Accession #:    [id number]      Weight:       194.0 lb Date of Birth:  08/18/1955       BSA:          2.103 m Patient Age:    61 years        BP:           154/90 mmHg Patient Gender: M               HR:           86 bpm. Exam Location:  [facility] Procedure: 2D Echo, Cardiac Doppler and Color Doppler Indications:    Congestive Heart Failure I50.9  History:        Patient has no prior history of Echocardiogram examinations.                 CHF, Stroke; Risk Factors:Hypertension, Diabetes and Current                 Smoker. Acute respiratory failure with hypoxia, ARF (acute renal                 failure).  Sonographer:    Alvino Chapel RCS Referring Phys: [place]  1. Left ventricular ejection fraction, by estimation, is 45 to 50%. The left ventricle has mildly decreased function. The left ventricle demonstrates global hypokinesis. There is moderate  concentric left ventricular hypertrophy. Left ventricular diastolic parameters are consistent with Grade II diastolic dysfunction (pseudonormalization).  2. Right ventricular systolic function is mildly reduced. The right ventricular size is normal. Tricuspid regurgitation signal is inadequate for assessing PA pressure.  3. Left atrial size was severely dilated.  4. Right atrial size was moderately dilated.  5. Left pleural effusion noted.  6. The mitral valve is degenerative. Mild mitral valve regurgitation.  7. The aortic valve is tricuspid. There is mild calcification of the aortic valve. Aortic valve regurgitation is trivial. Aortic valve sclerosis/calcification is present, without any evidence of aortic stenosis.  8. The inferior vena cava is dilated in size with >50% respiratory variability, suggesting right atrial pressure of 8 mmHg. Comparison(s): Prior images unable to be directly viewed. FINDINGS  Left Ventricle: Left ventricular ejection fraction, by estimation, is 45 to 50%. The left ventricle has mildly decreased function. The left ventricle demonstrates global hypokinesis. The left ventricular internal cavity size was normal in size. There is  moderate concentric left ventricular hypertrophy. Left ventricular diastolic parameters are consistent with Grade II diastolic dysfunction (pseudonormalization). Right Ventricle: The right ventricular size is normal. No increase in right ventricular wall thickness. Right ventricular systolic function is mildly reduced. Tricuspid regurgitation signal is inadequate for assessing PA pressure. Left Atrium: Left atrial size was severely dilated. Right Atrium: Right atrial size was moderately dilated. Pericardium: Left pleural effusion noted. There is no evidence of pericardial effusion. Mitral Valve: The mitral valve is degenerative in appearance. There is mild calcification of the  mitral valve leaflet(s). Mild to moderate mitral annular calcification. Mild mitral  valve regurgitation. Tricuspid Valve: The tricuspid valve is grossly normal. Tricuspid valve regurgitation is trivial. Aortic Valve: The aortic valve is tricuspid. There is mild calcification of the aortic valve. There is mild aortic valve annular calcification. Aortic valve regurgitation is trivial. Aortic valve sclerosis/calcification is present, without any evidence of aortic stenosis. Pulmonic Valve: The pulmonic valve was grossly normal. Pulmonic valve regurgitation is trivial. Aorta: The aortic root is normal in size and structure. Venous: The inferior vena cava is dilated in size with greater than 50% respiratory variability, suggesting right atrial pressure of 8 mmHg. IAS/Shunts: No atrial level shunt detected by color flow Doppler.  LEFT VENTRICLE PLAX 2D LVIDd:         5.40 cm   Diastology LVIDs:         4.10 cm   LV e' medial:    6.42 cm/s LV PW:         1.40 cm   LV E/e' medial:  22.1 LV IVS:        1.40 cm   LV e' lateral:   8.26 cm/s LVOT diam:     2.00 cm   LV E/e' lateral: 17.2 LV SV:         55 LV SV Index:   26 LVOT Area:     3.14 cm  RIGHT VENTRICLE RV S prime:     14.50 cm/s TAPSE (M-mode): 2.4 cm LEFT ATRIUM              Index        RIGHT ATRIUM           Index LA diam:        4.60 cm  2.19 cm/m   RA Area:     28.20 cm LA Vol (A2C):   140.0 ml 66.57 ml/m  RA Volume:   100.00 ml 47.55 ml/m LA Vol (A4C):   105.0 ml 49.92 ml/m LA Biplane Vol: 122.0 ml 58.01 ml/m  AORTIC VALVE LVOT Vmax:   90.00 cm/s LVOT Vmean:  63.600 cm/s LVOT VTI:    0.174 m  AORTA Ao Root diam: 3.50 cm MITRAL VALVE MV Area (PHT): 5.66 cm     SHUNTS MV Decel Time: 134 msec     Systemic VTI:  0.17 m MV E velocity: 142.00 cm/s  Systemic Diam: 2.00 cm MV A velocity: 56.90 cm/s MV E/A ratio:  2.50 Rozann Lesches MD Electronically signed by Rozann Lesches MD Signature Date/Time: 06/24/2022/2:58:46 PM    Final    CT Renal Stone Study  Result Date: 06/24/2022 CLINICAL DATA:  Abdominal pain and flank pain with kidney stone  suspected in a 67 year old male. EXAM: CT ABDOMEN AND PELVIS WITHOUT CONTRAST TECHNIQUE: Multidetector CT imaging of the abdomen and pelvis was performed following the standard protocol without IV contrast. RADIATION DOSE REDUCTION: This exam was performed according to the departmental dose-optimization program which includes automated exposure control, adjustment of the mA and/or kV according to patient size and/or use of iterative reconstruction technique. COMPARISON:  Renal sonogram from 2019. No recent abdominal imaging for comparison. FINDINGS: Lower chest: Moderately large RIGHT and moderate LEFT pleural effusions. Septal thickening at the lung bases. Basilar volume loss/consolidation bilaterally. Heart size is enlarged. Low-attenuation cardiac blood pool. No pericardial effusion. Heart is incompletely imaged. No chest wall abnormality. Hepatobiliary: Smooth hepatic contours. No pericholecystic stranding. No gross biliary duct distension. No visible lesion on noncontrast imaging. Small amount of sludge  in the gallbladder. Pancreas: Pancreas with normal contours, no signs of inflammation or peripancreatic fluid. Spleen: Normal. Adrenals/Urinary Tract: LEFT adrenal nodule measures 25 Hounsfield units and is 12 x 11 mm. Lobular bilateral renal contours a represent fetal lobation or scarring. No nephrolithiasis. No hydronephrosis. Mild perinephric stranding. No substantial perivesical stranding. No ureteral calculi. Stomach/Bowel: Small hiatal hernia. No stranding adjacent to the stomach. No sign of small bowel obstruction. No sign of small bowel inflammation. Normal appendix. No colonic distension or inflammation. Vascular/Lymphatic: Aortic atherosclerosis. No sign of aneurysm. Smooth contour of the IVC. There is no gastrohepatic or hepatoduodenal ligament lymphadenopathy. No retroperitoneal or mesenteric lymphadenopathy. No pelvic sidewall lymphadenopathy. Calcified atherosclerotic changes are moderate is  limited assessment of vascular structures due to lack of intravenous contrast. Reproductive: Unremarkable by CT. Other: Body wall edema. More pronounced periumbilical edema than other areas over the abdomen. Flank edema is symmetric. No substantial intra-abdominal fluid. No pneumoperitoneum. Musculoskeletal: No acute or destructive bone process. Mild spinal degenerative changes. IMPRESSION: 1. Suggestion of heart failure or volume overload in this patient with cardiomegaly, with mild to moderate anasarca associated with bilateral pleural effusions and pulmonary edema. 2. Basilar airspace disease likely related to volume loss. Would also correlate with signs of infection. 3. More pronounced stranding about the umbilicus of uncertain significance, potentially related to underlying edema but given more concentrated stranding in this area would suggest correlation with any signs of infection/cellulitis. 4. No acute intra-abdominal or pelvic findings. No signs of nephrolithiasis or ureteral calculi. Normal appendix. 5. 1.2 cm left adrenal mass, probable benign adenoma. Recommend follow-up adrenal washout CT in 1 year. If stable for = 1 year, no further follow-up imaging. JACR 2017 Aug; 14(8):1038-44, JCAT 2016 Mar-Apr; 40(2):194-200, Urol J 2006 Spring; 3(2):71-4. Also consider the following. Based on current clinical literature, biochemical evaluation to exclude possible functioning adrenal nodule is suggested if not already performed. Please refer to current clinical guidelines for detailed recommendations. NEJM OO:[id number] [id number]. 6. Low-attenuation cardiac blood pool, can be seen in the setting of anemia. 7. Small hiatal hernia. 8. Aortic atherosclerosis. Aortic Atherosclerosis (ICD10-I70.0). Electronically Signed   By: Zetta Bills M.D.   On: 06/24/2022 08:28   DG Chest Portable 1 View  Result Date: 06/24/2022 CLINICAL DATA:  Evaluate for dyspnea.  History of CHF EXAM: PORTABLE CHEST 1 VIEW COMPARISON:  12/25/2020  FINDINGS: Chronic cardiomegaly. Small pleural effusions and bilateral airspace disease greater on the right. No pneumothorax. IMPRESSION: CHF pattern. Electronically Signed   By: Jorje Guild M.D.   On: 06/24/2022 05:48    Orson Eva, DO  [facility]  If 7PM-7AM, please contact night-coverage [email or web address] Password [facility] 07/04/2022, 5:36 PM   LOS: 10 days

## 2022-07-05 DIAGNOSIS — I5022 Chronic systolic (congestive) heart failure: Secondary | ICD-10-CM

## 2022-07-05 LAB — RENAL FUNCTION PANEL
Albumin: 2.1 g/dL — ABNORMAL LOW (ref 3.5–5.0)
Anion gap: 12 (ref 5–15)
BUN: 35 mg/dL — ABNORMAL HIGH (ref 8–23)
CO2: 23 mmol/L (ref 22–32)
Calcium: 7.6 mg/dL — ABNORMAL LOW (ref 8.9–10.3)
Chloride: 97 mmol/L — ABNORMAL LOW (ref 98–111)
Creatinine, Ser: 7.24 mg/dL — ABNORMAL HIGH (ref 0.61–1.24)
GFR, Estimated: 8 mL/min — ABNORMAL LOW (ref >60)
Glucose, Bld: 101 mg/dL — ABNORMAL HIGH (ref 70–99)
Phosphorus: 4.3 mg/dL (ref 2.5–4.6)
Potassium: 4 mmol/L (ref 3.5–5.1)
Sodium: 132 mmol/L — ABNORMAL LOW (ref 135–145)

## 2022-07-05 LAB — GLUCOSE, CAPILLARY
Glucose-Capillary: 101 mg/dL — ABNORMAL HIGH (ref 70–99)
Glucose-Capillary: 106 mg/dL — ABNORMAL HIGH (ref 70–99)
Glucose-Capillary: 115 mg/dL — ABNORMAL HIGH (ref 70–99)
Glucose-Capillary: 116 mg/dL — ABNORMAL HIGH (ref 70–99)
Glucose-Capillary: 102 mg/dL — ABNORMAL HIGH (ref 70–99)

## 2022-07-05 MED ORDER — CALCITRIOL 0.5 MCG PO CAPS: 0.5000 ug | ORAL_CAPSULE | Freq: Every day | ORAL | 1 refills | Status: DC

## 2022-07-05 MED ORDER — SACUBITRIL-VALSARTAN 97-103 MG PO TABS: 1.0000 | ORAL_TABLET | Freq: Two times a day (BID) | ORAL | 1 refills | Status: DC

## 2022-07-05 MED ORDER — ISOSORBIDE MONONITRATE ER 30 MG PO TB24: 30.0000 mg | ORAL_TABLET | Freq: Every day | ORAL | 1 refills | Status: DC

## 2022-07-05 MED ORDER — HYDRALAZINE HCL 25 MG PO TABS: 75.0000 mg | ORAL_TABLET | Freq: Three times a day (TID) | ORAL | 1 refills | Status: DC

## 2022-07-05 MED ORDER — AMLODIPINE BESYLATE 10 MG PO TABS: 10.0000 mg | ORAL_TABLET | Freq: Every day | ORAL | 1 refills | Status: DC

## 2022-07-05 MED ORDER — SPIRONOLACTONE 25 MG PO TABS
25.0000 mg | ORAL_TABLET | Freq: Every day | ORAL | Status: DC
  Administered 2022-07-05: 25 mg via ORAL
  Filled 2022-07-05: qty 1

## 2022-07-05 MED ORDER — CARVEDILOL 25 MG PO TABS: 50.0000 mg | ORAL_TABLET | Freq: Two times a day (BID) | ORAL | 1 refills | Status: DC

## 2022-07-05 MED ORDER — FUROSEMIDE 40 MG PO TABS
80.0000 mg | ORAL_TABLET | Freq: Every day | ORAL | Status: DC
  Administered 2022-07-05: 80 mg via ORAL
  Filled 2022-07-05: qty 2

## 2022-07-05 MED ORDER — FUROSEMIDE 80 MG PO TABS: 80.0000 mg | ORAL_TABLET | Freq: Every day | ORAL | 1 refills | Status: DC

## 2022-07-05 MED ORDER — HYDRALAZINE HCL 25 MG PO TABS
75.0000 mg | ORAL_TABLET | Freq: Three times a day (TID) | ORAL | Status: DC
  Filled 2022-07-05: qty 3

## 2022-07-05 MED ORDER — CARVEDILOL 12.5 MG PO TABS
50.0000 mg | ORAL_TABLET | Freq: Two times a day (BID) | ORAL | Status: DC
  Administered 2022-07-05: 50 mg via ORAL
  Filled 2022-07-05: qty 4

## 2022-07-05 NOTE — Progress Notes (Signed)
Patient alert and oriented x4. No complaints of pain, shortness of breath, chest pain, dizziness, nausea or vomiting. Patient tolerated PO meds and diet well. Patient refused dinner due to wanting to be discharged since dialysis completed at 1825. IV removed with no complications. Went over discharged summary, appointment follow ups and medication education with patient. Patient expressed full understanding with teach back. All questions answered. Printed prescriptions given to patient and went over with patient. Patient expressed full understanding. Patient ambulatory independently in room and ICU hallway with steady gait.  Patient refusing to put O2 on at time of discharge and wanting to remain on room air. Justin RN (night shift) present during discharged and assisted transferring patient by wheelchair down to patient mother's car. Portable O2 concentrator and tank with nasal cannula supplies taken down with patient and placed in the trunk of the car per patient request. Patient discharged home with all belongings via car.

## 2022-07-05 NOTE — Discharge Summary (Signed)
Physician Discharge Summary   Patient: Matthew Robles MRN: [id number] DOB: July 08, 1955  Admit date:     06/24/2022  Discharge date: 07/05/22  Discharge Physician: Shanon Brow Dion Parrow   PCP: Pcp, No   Recommendations at discharge:   Please follow up with primary care provider within 1-2 weeks  Please repeat BMP and CBC in one week     Hospital Course: 67 year old gentleman with longstanding hypertension since age 61, stage IV CKD, type 2 diabetes mellitus with neuropathy, gout, sleep disorder, history of stroke, congestive heart failure, anemia, chronic edema who established nephrology care with Dr. Trinda Pascal on 06/16/2021.  He apparently was lost to follow up.   He reported that over the past 2 weeks has had progressive symptoms of increasing edema in the lower extremities and poor urine output.  He reports that he noticed less than 2 ounces of urine in the past couple of days.  He is also had a cough and increasing shortness of breath that has worsened over the past 3 days.  He denies chest pain and palpitations.  He has noticed significant increase in the edema in the legs.  It is affecting his ambulation.  He also noticed increased abdominal distention from edema.  He reports that he has been taking his medications for blood pressure.  He reports that he takes amlodipine and hydralazine.  He does have a history of poor compliance with medications.     Patient presented to the emergency department with acute respiratory distress that woke him up this morning.  His pulse ox was 60%.  EMS had him on CPAP.  The ED placed him on BiPAP with improvement in symptoms.  His chest x-ray showed pulmonary edema and bilateral pleural effusions.  His BNP was markedly elevated.  He appears clinically volume overloaded.  He was given IV Lasix with no significant urine output.  His creatinine was elevated at 12.  His blood pressure was markedly elevated and he was placed on an IV nitroglycerin infusion.  Nephrology was  consulted.  He is being admitted for management of hypertensive emergency.  Nephrology was consulted, and ultimately, the patient was initiated on HD.  He went through the CLIP process and an outpt dialysis chair was arranged.  Cardiology was consulted for his reduced EF on echo, and his cardiac and anti-HTN meds were introduced and adjusted.  Assessment and Plan:  Hypertensive emergency  - pt presented with markedly elevated BP and AKI - now is off IV nitroglycerin (ineffective); started IV nicardipine on 2/5   - HD treatments for volume removal  - weaned off nicardipine 2/7 evening - continue amlodipine, coreg, Entresto -anticipate continued improvement of BP with HD - increased coreg 50 mg bid - increase hydralazine 75 mg tid -added imdur   AKI on CKD stage IV--->now ESRD  - records indicate he was stage IV CKD 1 year ago - he is having poor urinary output at this time - foley catheter discontinued  - appreciate nephrology consultation - nephrology started HD 2/2 --TDC placed 2/2 - Daily renal function panel ordered - next HD on 2/12 - pt has HD chair at [facility] starting 2/13>>tentatively plan d/c after HD 2/12 if medically stable    Acute HFmrEF -06/24/22 echo--EF 45-50%, mild decrease RVF, G2DD, global HK -02/22/18 Echo EF 50-55%, no WMA, G2DD -transition metoprolol to carvedilol -consult cardiology--appreciated -Entresto started -SGLT2i contraindicated in HD patient    Non Anion Gap Metabolic Acidosis - resolved  - secondary to acute renal failure  -  HD treatments started and acidosis has resolved   - nephrology team consulting    Acute respiratory failure with hypoxia  - secondary to pulmonary edema, Acute HFpEF - pt responded well to bipap therapy - he is now off bipap therapy but will leave for PRN treatment and QHS treatment - HD started  - RSV, Covid, Flu testing negative  -now stable on 2-3 L -has at least 30 pack years tobacco -will need to go home with 2L  [place]   Type 2 Diabetes Mellitus with nephropathy -06/24/22 A1C--5.9 -continue novolog sliding scale -CBGs controlled   Leukocytosis - resolved - likley stress demargination - resolved    Hypokalemia - correcting on HD - supplementing judiciously - following daily renal function panel - improved   Bilateral Pleural Effusion  - secondary to volume overload  - treating with diuresis vs hemodialysis - hemodialysis started 06/25/22 per nephrology - remains fluid overloaded but much improved  - CXR showing improvement in pulmonary edema     Anemia in CKD  - Hg down to 6.7 - transfused 1 unit PRBC on 06/24/22 - transfuse 1 unit PRBC with HD on 06/26/22 - follow up anemia panel - Hg improved to 8 after 2nd unit given on 2/3 - Hgb remains stable        Consultants: renal, cardiology Procedures performed: HD, TDC  Disposition: Home Diet recommendation:  Cardiac diet/carb modified DISCHARGE MEDICATION: Allergies as of 07/05/2022   No Known Allergies      Medication List     STOP taking these medications    aspirin EC 81 MG tablet   insulin glargine 100 UNIT/ML injection Commonly known as: LANTUS   metoprolol succinate 25 MG 24 hr tablet Commonly known as: TOPROL-XL   potassium chloride SA 20 MEQ tablet Commonly known as: KLOR-CON M       TAKE these medications    amLODipine 10 MG tablet Commonly known as: NORVASC Take 1 tablet (10 mg total) by mouth daily.   calcitRIOL 0.5 MCG capsule Commonly known as: ROCALTROL Take 1 capsule (0.5 mcg total) by mouth daily.   carvedilol 25 MG tablet Commonly known as: COREG Take 2 tablets (50 mg total) by mouth 2 (two) times daily with a meal.   furosemide 80 MG tablet Commonly known as: LASIX Take 1 tablet (80 mg total) by mouth daily. What changed:  medication strength how much to take when to take this   hydrALAZINE 25 MG tablet Commonly known as: APRESOLINE Take 3 tablets (75 mg total) by mouth every 8 (eight)  hours. What changed:  medication strength how much to take when to take this   isosorbide mononitrate 30 MG 24 hr tablet Commonly known as: IMDUR Take 1 tablet (30 mg total) by mouth daily.   sacubitril-valsartan 97-103 MG Commonly known as: ENTRESTO Take 1 tablet by mouth 2 (two) times daily.               Durable Medical Equipment  (From admission, onward)           Start     Ordered   07/04/22 1740  For home use only DME oxygen  Once       Question Answer Comment  Length of Need 6 Months   Mode or (Route) Nasal cannula   Liters per Minute 2   Frequency Continuous (stationary and portable oxygen unit needed)   Oxygen conserving device Yes   Oxygen delivery system Gas      07/04/22 1739  Follow-up Information     Dialysis, [facility]. Go to.   Why: Chair time is Tuesday/ Thursday/Saturday schedule at 11am beginning 07/06/22. Needs to arrive at 10:30 for first appointment. Contact information: [address] Linna Hoff [facility] [address] [phone]                Discharge Exam: Filed Weights   07/03/22 0500 07/04/22 0401 07/05/22 0500  Weight: 80.3 kg 82.8 kg 84.2 kg   HEENT:  [place]/AT, No thrush, no icterus CV:  RRR, no rub, no S3, no S4 Lung:  bibasilar crackles. No wheeze Abd:  soft/+BS, NT Ext:  No edema, no lymphangitis, no synovitis, no rash   Condition at discharge: stable  The results of significant diagnostics from this hospitalization (including imaging, microbiology, ancillary and laboratory) are listed below for reference.   Imaging Studies: DG Chest Port 1 View  Result Date: 07/04/2022 CLINICAL DATA:  Shortness of breath. EXAM: PORTABLE CHEST 1 VIEW COMPARISON:  06/28/2022 FINDINGS: 0540 hours. The lungs are clear without focal pneumonia, edema, pneumothorax or pleural effusion. Cardiopericardial silhouette is at upper limits of normal for size. Retrocardiac left base collapse/consolidation is similar to mildly  improved in the interval. Tiny bilateral pleural effusions evident. Right central line tip overlies the mid SVC level. Telemetry leads overlie the chest. IMPRESSION: 1. Similar to mildly improved retrocardiac left base collapse/consolidation. 2. Tiny bilateral pleural effusions. Electronically Signed   By: Misty Stanley M.D.   On: 07/04/2022 07:57   DG CHEST PORT 1 VIEW  Result Date: 06/28/2022 CLINICAL DATA:  Pulmonary edema EXAM: PORTABLE CHEST 1 VIEW COMPARISON:  Chest radiograph 06/25/2022 FINDINGS: Right-sided central venous catheter with the tip in the mid SVC, unchanged. Right costophrenic angle is out of the field of view. Possible small left pleural effusion. No pneumothorax. Unchanged cardiac mediastinal contours. No focal airspace opacity. There are prominent bilateral interstitial opacities, which could represent pulmonary venous congestion or mild pulmonary edema. Degenerative changes of the right AC joint. No radiographically apparent displaced rib fractures. IMPRESSION: Prominent bilateral interstitial opacities, which could represent pulmonary venous congestion or mild pulmonary edema. Unchanged small left pleural effusion. Electronically Signed   By: Marin Roberts M.D.   On: 06/28/2022 06:54   DG Chest Port 1 View  Result Date: 06/25/2022 CLINICAL DATA:  Postoperative vascular dialysis catheter. EXAM: PORTABLE CHEST 1 VIEW COMPARISON:  06/24/2022 FINDINGS: Interval placement of a right central venous catheter with tip over the low SVC region. No pneumothorax. Mediastinal contours appear intact. Heart size and pulmonary vascularity are mildly prominent but improved since prior study. Small bilateral pleural effusions. IMPRESSION: 1. Right central venous catheter appears in appropriate radiographic position. No pneumothorax. 2. Cardiac enlargement with mild vascular congestion, improved since prior study. Small bilateral pleural effusions. Electronically Signed   By: Lucienne Capers M.D.   On:  06/25/2022 18:53   DG C-Arm 1-60 Min-No Report  Result Date: 06/25/2022 Fluoroscopy was utilized by the requesting physician.  No radiographic interpretation.   ECHOCARDIOGRAM COMPLETE  Result Date: 06/24/2022    ECHOCARDIOGRAM REPORT   Patient Name:   RODRIGUEZ SEDORE Date of Exam: 06/24/2022 Medical Rec #:  [id number]       Height:       72.0 in Accession #:    [id number]      Weight:       194.0 lb Date of Birth:  May 09, 1956       BSA:          2.103 m Patient Age:  66 years        BP:           154/90 mmHg Patient Gender: M               HR:           86 bpm. Exam Location:  [facility] Procedure: 2D Echo, Cardiac Doppler and Color Doppler Indications:    Congestive Heart Failure I50.9  History:        Patient has no prior history of Echocardiogram examinations.                 CHF, Stroke; Risk Factors:Hypertension, Diabetes and Current                 Smoker. Acute respiratory failure with hypoxia, ARF (acute renal                 failure).  Sonographer:    Alvino Chapel RCS Referring Phys: [place]  1. Left ventricular ejection fraction, by estimation, is 45 to 50%. The left ventricle has mildly decreased function. The left ventricle demonstrates global hypokinesis. There is moderate concentric left ventricular hypertrophy. Left ventricular diastolic parameters are consistent with Grade II diastolic dysfunction (pseudonormalization).  2. Right ventricular systolic function is mildly reduced. The right ventricular size is normal. Tricuspid regurgitation signal is inadequate for assessing PA pressure.  3. Left atrial size was severely dilated.  4. Right atrial size was moderately dilated.  5. Left pleural effusion noted.  6. The mitral valve is degenerative. Mild mitral valve regurgitation.  7. The aortic valve is tricuspid. There is mild calcification of the aortic valve. Aortic valve regurgitation is trivial. Aortic valve sclerosis/calcification is present, without any evidence  of aortic stenosis.  8. The inferior vena cava is dilated in size with >50% respiratory variability, suggesting right atrial pressure of 8 mmHg. Comparison(s): Prior images unable to be directly viewed. FINDINGS  Left Ventricle: Left ventricular ejection fraction, by estimation, is 45 to 50%. The left ventricle has mildly decreased function. The left ventricle demonstrates global hypokinesis. The left ventricular internal cavity size was normal in size. There is  moderate concentric left ventricular hypertrophy. Left ventricular diastolic parameters are consistent with Grade II diastolic dysfunction (pseudonormalization). Right Ventricle: The right ventricular size is normal. No increase in right ventricular wall thickness. Right ventricular systolic function is mildly reduced. Tricuspid regurgitation signal is inadequate for assessing PA pressure. Left Atrium: Left atrial size was severely dilated. Right Atrium: Right atrial size was moderately dilated. Pericardium: Left pleural effusion noted. There is no evidence of pericardial effusion. Mitral Valve: The mitral valve is degenerative in appearance. There is mild calcification of the mitral valve leaflet(s). Mild to moderate mitral annular calcification. Mild mitral valve regurgitation. Tricuspid Valve: The tricuspid valve is grossly normal. Tricuspid valve regurgitation is trivial. Aortic Valve: The aortic valve is tricuspid. There is mild calcification of the aortic valve. There is mild aortic valve annular calcification. Aortic valve regurgitation is trivial. Aortic valve sclerosis/calcification is present, without any evidence of aortic stenosis. Pulmonic Valve: The pulmonic valve was grossly normal. Pulmonic valve regurgitation is trivial. Aorta: The aortic root is normal in size and structure. Venous: The inferior vena cava is dilated in size with greater than 50% respiratory variability, suggesting right atrial pressure of 8 mmHg. IAS/Shunts: No atrial  level shunt detected by color flow Doppler.  LEFT VENTRICLE PLAX 2D LVIDd:         5.40 cm  Diastology LVIDs:         4.10 cm   LV e' medial:    6.42 cm/s LV PW:         1.40 cm   LV E/e' medial:  22.1 LV IVS:        1.40 cm   LV e' lateral:   8.26 cm/s LVOT diam:     2.00 cm   LV E/e' lateral: 17.2 LV SV:         55 LV SV Index:   26 LVOT Area:     3.14 cm  RIGHT VENTRICLE RV S prime:     14.50 cm/s TAPSE (M-mode): 2.4 cm LEFT ATRIUM              Index        RIGHT ATRIUM           Index LA diam:        4.60 cm  2.19 cm/m   RA Area:     28.20 cm LA Vol (A2C):   140.0 ml 66.57 ml/m  RA Volume:   100.00 ml 47.55 ml/m LA Vol (A4C):   105.0 ml 49.92 ml/m LA Biplane Vol: 122.0 ml 58.01 ml/m  AORTIC VALVE LVOT Vmax:   90.00 cm/s LVOT Vmean:  63.600 cm/s LVOT VTI:    0.174 m  AORTA Ao Root diam: 3.50 cm MITRAL VALVE MV Area (PHT): 5.66 cm     SHUNTS MV Decel Time: 134 msec     Systemic VTI:  0.17 m MV E velocity: 142.00 cm/s  Systemic Diam: 2.00 cm MV A velocity: 56.90 cm/s MV E/A ratio:  2.50 Rozann Lesches MD Electronically signed by Rozann Lesches MD Signature Date/Time: 06/24/2022/2:58:46 PM    Final    CT Renal Stone Study  Result Date: 06/24/2022 CLINICAL DATA:  Abdominal pain and flank pain with kidney stone suspected in a 67 year old male. EXAM: CT ABDOMEN AND PELVIS WITHOUT CONTRAST TECHNIQUE: Multidetector CT imaging of the abdomen and pelvis was performed following the standard protocol without IV contrast. RADIATION DOSE REDUCTION: This exam was performed according to the departmental dose-optimization program which includes automated exposure control, adjustment of the mA and/or kV according to patient size and/or use of iterative reconstruction technique. COMPARISON:  Renal sonogram from 2019. No recent abdominal imaging for comparison. FINDINGS: Lower chest: Moderately large RIGHT and moderate LEFT pleural effusions. Septal thickening at the lung bases. Basilar volume loss/consolidation  bilaterally. Heart size is enlarged. Low-attenuation cardiac blood pool. No pericardial effusion. Heart is incompletely imaged. No chest wall abnormality. Hepatobiliary: Smooth hepatic contours. No pericholecystic stranding. No gross biliary duct distension. No visible lesion on noncontrast imaging. Small amount of sludge in the gallbladder. Pancreas: Pancreas with normal contours, no signs of inflammation or peripancreatic fluid. Spleen: Normal. Adrenals/Urinary Tract: LEFT adrenal nodule measures 25 Hounsfield units and is 12 x 11 mm. Lobular bilateral renal contours a represent fetal lobation or scarring. No nephrolithiasis. No hydronephrosis. Mild perinephric stranding. No substantial perivesical stranding. No ureteral calculi. Stomach/Bowel: Small hiatal hernia. No stranding adjacent to the stomach. No sign of small bowel obstruction. No sign of small bowel inflammation. Normal appendix. No colonic distension or inflammation. Vascular/Lymphatic: Aortic atherosclerosis. No sign of aneurysm. Smooth contour of the IVC. There is no gastrohepatic or hepatoduodenal ligament lymphadenopathy. No retroperitoneal or mesenteric lymphadenopathy. No pelvic sidewall lymphadenopathy. Calcified atherosclerotic changes are moderate is limited assessment of vascular structures due to lack of intravenous contrast. Reproductive: Unremarkable by CT. Other: Body wall  edema. More pronounced periumbilical edema than other areas over the abdomen. Flank edema is symmetric. No substantial intra-abdominal fluid. No pneumoperitoneum. Musculoskeletal: No acute or destructive bone process. Mild spinal degenerative changes. IMPRESSION: 1. Suggestion of heart failure or volume overload in this patient with cardiomegaly, with mild to moderate anasarca associated with bilateral pleural effusions and pulmonary edema. 2. Basilar airspace disease likely related to volume loss. Would also correlate with signs of infection. 3. More pronounced  stranding about the umbilicus of uncertain significance, potentially related to underlying edema but given more concentrated stranding in this area would suggest correlation with any signs of infection/cellulitis. 4. No acute intra-abdominal or pelvic findings. No signs of nephrolithiasis or ureteral calculi. Normal appendix. 5. 1.2 cm left adrenal mass, probable benign adenoma. Recommend follow-up adrenal washout CT in 1 year. If stable for = 1 year, no further follow-up imaging. JACR 2017 Aug; 14(8):1038-44, JCAT 2016 Mar-Apr; 40(2):194-200, Urol J 2006 Spring; 3(2):71-4. Also consider the following. Based on current clinical literature, biochemical evaluation to exclude possible functioning adrenal nodule is suggested if not already performed. Please refer to current clinical guidelines for detailed recommendations. NEJM OO:[id number] 154-51. 6. Low-attenuation cardiac blood pool, can be seen in the setting of anemia. 7. Small hiatal hernia. 8. Aortic atherosclerosis. Aortic Atherosclerosis (ICD10-I70.0). Electronically Signed   By: Zetta Bills M.D.   On: 06/24/2022 08:28   DG Chest Portable 1 View  Result Date: 06/24/2022 CLINICAL DATA:  Evaluate for dyspnea.  History of CHF EXAM: PORTABLE CHEST 1 VIEW COMPARISON:  12/25/2020 FINDINGS: Chronic cardiomegaly. Small pleural effusions and bilateral airspace disease greater on the right. No pneumothorax. IMPRESSION: CHF pattern. Electronically Signed   By: Jorje Guild M.D.   On: 06/24/2022 05:48    Microbiology: Results for orders placed or performed during the hospital encounter of 06/24/22  Resp panel by RT-PCR (RSV, Flu A&B, Covid) Anterior Nasal Swab     Status: None   Collection Time: 06/24/22 10:12 AM   Specimen: Anterior Nasal Swab  Result Value Ref Range Status   SARS Coronavirus 2 by RT PCR NEGATIVE NEGATIVE Final    Comment: (NOTE) SARS-CoV-2 target nucleic acids are NOT DETECTED.  The SARS-CoV-2 RNA is generally detectable in upper  respiratory specimens during the acute phase of infection. The lowest concentration of SARS-CoV-2 viral copies this assay can detect is 138 copies/mL. A negative result does not preclude SARS-Cov-2 infection and should not be used as the sole basis for treatment or other patient management decisions. A negative result may occur with  improper specimen collection/handling, submission of specimen other than nasopharyngeal swab, presence of viral mutation(s) within the areas targeted by this assay, and inadequate number of viral copies(<138 copies/mL). A negative result must be combined with clinical observations, patient history, and epidemiological information. The expected result is Negative.  Fact Sheet for Patients:  EntrepreneurPulse.com.au  Fact Sheet for Healthcare Providers:  IncredibleEmployment.be  This test is no t yet approved or cleared by the [place] FDA and  has been authorized for detection and/or diagnosis of SARS-CoV-2 by FDA under an Emergency Use Authorization (EUA). This EUA will remain  in effect (meaning this test can be used) for the duration of the COVID-19 declaration under Section 564(b)(1) of the Act, 21 U.S.C.section 360bbb-3(b)(1), unless the authorization is terminated  or revoked sooner.       Influenza A by PCR NEGATIVE NEGATIVE Final   Influenza B by PCR NEGATIVE NEGATIVE Final    Comment: (NOTE) The Xpert Xpress  SARS-CoV-2/FLU/RSV plus assay is intended as an aid in the diagnosis of influenza from Nasopharyngeal swab specimens and should not be used as a sole basis for treatment. Nasal washings and aspirates are unacceptable for Xpert Xpress SARS-CoV-2/FLU/RSV testing.  Fact Sheet for Patients: EntrepreneurPulse.com.au  Fact Sheet for Healthcare Providers: IncredibleEmployment.be  This test is not yet approved or cleared by the [place] FDA and has been  authorized for detection and/or diagnosis of SARS-CoV-2 by FDA under an Emergency Use Authorization (EUA). This EUA will remain in effect (meaning this test can be used) for the duration of the COVID-19 declaration under Section 564(b)(1) of the Act, 21 U.S.C. section 360bbb-3(b)(1), unless the authorization is terminated or revoked.     Resp Syncytial Virus by PCR NEGATIVE NEGATIVE Final    Comment: (NOTE) Fact Sheet for Patients: EntrepreneurPulse.com.au  Fact Sheet for Healthcare Providers: IncredibleEmployment.be  This test is not yet approved or cleared by the [place] FDA and has been authorized for detection and/or diagnosis of SARS-CoV-2 by FDA under an Emergency Use Authorization (EUA). This EUA will remain in effect (meaning this test can be used) for the duration of the COVID-19 declaration under Section 564(b)(1) of the Act, 21 U.S.C. section 360bbb-3(b)(1), unless the authorization is terminated or revoked.  Performed at [facility], [address], [place], [place] [address]   MRSA Next Gen by PCR, Nasal     Status: None   Collection Time: 06/24/22 10:12 AM   Specimen: Anterior Nasal Swab  Result Value Ref Range Status   MRSA by PCR Next Gen NOT DETECTED NOT DETECTED Final    Comment: (NOTE) The GeneXpert MRSA Assay (FDA approved for NASAL specimens only), is one component of a comprehensive MRSA colonization surveillance program. It is not intended to diagnose MRSA infection nor to guide or monitor treatment for MRSA infections. Test performance is not FDA approved in patients less than 82 years old. Performed at [facility], [address] [place], [place] [address]     Labs: CBC: Recent Labs  Lab 06/29/22 0438 06/30/22 0346 07/02/22 1827  WBC 10.3 11.1* 12.6*  HGB 8.1* 8.5* 8.6*  HCT 25.1* 26.0* 26.9*  MCV 92.6 91.9 94.7  PLT 218 236 [id number]   Basic Metabolic Panel: Recent Labs  Lab 06/30/22 0346  07/01/22 0442 07/02/22 0530 07/03/22 0445 07/04/22 0343 07/05/22 0331  NA 131* 133* 134* 133* 133* 132*  K 3.2* 3.3* 3.2* 3.5 3.7 4.0  CL 95* 97* 101 97* 98 97*  CO2 23 25 24 26 23 23  $ GLUCOSE 82 107* 143* 151* 107* 101*  BUN 41* 30* 35* 21 29* 35*  CREATININE 7.80* 5.43* 6.62* 4.97* 6.18* 7.24*  CALCIUM 6.7* 6.9* 6.9* 7.3* 7.3* 7.6*  MG 2.0  --   --   --   --   --   PHOS 5.8* 3.8 4.1 2.9 3.5 4.3   Liver Function Tests: Recent Labs  Lab 07/01/22 0442 07/02/22 0530 07/03/22 0445 07/04/22 0343 07/05/22 0331  ALBUMIN 1.8* 1.6* 1.9* 1.8* 2.1*   CBG: Recent Labs  Lab 07/04/22 1151 07/04/22 1642 07/04/22 2056 07/05/22 0257 07/05/22 0726  GLUCAP 133* 139* 123* 101* 106*    Discharge time spent: greater than 30 minutes.  Signed: Orson Eva, MD [facility] 07/05/2022

## 2022-07-05 NOTE — TOC Transition Note (Addendum)
Transition of Care [facility]) - CM/SW Discharge Note   Patient Details  Name: Matthew Robles MRN: [id number] Date of Birth: Aug 24, 1955  Transition of Care [facility]) CM/SW Contact:  Boneta Lucks, RN Phone Number: 07/05/2022, 11:19 AM   Clinical Narrative:   Patient discharging home today. TOC RN gave patient a MATCH voucher for medication. Explained this is for 30 days of mediation. Also gave handout with [place] services address and phone number and explained he needs to follow up on Medicare and Medicaid applications.  LOG provided to [employer] for home oxygen. RN/MD updated. Patient will DC home after HD today.  Addendum : [employer] delivered oxygen. CM explain to mother patient is in dialysis and will not DC until 7PM. Updating [employer] to call her again to set up home delivery.    Final next level of care: Home/Self Care Barriers to Discharge: Barriers Resolved   Patient Goals and CMS Choice CMS Medicare.gov Compare Post Acute Care list provided to:: Patient   Discharge Placement    Patient and family notified of of transfer: 07/05/22  Discharge Plan and Services Additional resources added to the After Visit Summary for        Social Determinants of Health (SDOH) Interventions SDOH Screenings   Food Insecurity: No Food Insecurity (06/24/2022)  Housing: Low Risk  (06/24/2022)  Transportation Needs: No Transportation Needs (06/24/2022)  Utilities: Not At Risk (06/24/2022)  Tobacco Use: Medium Risk (07/01/2022)    Readmission Risk Interventions    06/29/2022   12:02 PM  Readmission Risk Prevention Plan  Transportation Screening Complete  HRI or [place] Complete  Social Work Consult for [place] Planning/Counseling Complete  Palliative Care Screening Not Applicable  Medication Review Press photographer) Complete

## 2022-07-05 NOTE — Progress Notes (Signed)
New Dialysis Start    Patient identified as new dialysis start. Kidney Education packet assembled and given. Discussed the following items with patient:     Current medications and possible changes once started:  Discussed that patient's medications may change over time.  Ex; hypertension medications and diabetes medication.  Nephrologists will adjust as needed.   Fluid restrictions reviewed:  32 oz daily goal:  All liquids count; soups, ice, jello, fruits.   Phosphorus and potassium: Handout given showing high potassium and phosphorus foods.  Alternative food and drink options given.   Family support:  none currently at bedside, but has support available.   Outpatient Clinic Resources:  Discussed roles of Outpatient clinic staff and advised to make a list of needs, if any, to talk with outpatient staff if needed.   Care plan schedule: Informed patient of Care Plans in outpatient setting and to participate in the care plan.  An invitation would be given from outpatient clinic.    Dialysis Access Options:  Reviewed access options with patients. Discussed in detail about care at home with new AVG & AVF. Reviewed checking bruit and thrill. If dialysis catheter present, educated that patient could not take showers.  Catheter dressing changes were to be done by outpatient clinic staff only.   Home therapy options:  Educated patient about home therapy options:  PD vs home hemo.     Patient verbalized understanding. Will continue to round on patient during admission.    Aurther Loft Dialysis Nurse Coordinator [phone]

## 2022-07-05 NOTE — Progress Notes (Signed)
  HEMODIALYSIS TREATMENT NOTE:  Uneventful 3.75h low-heparin treatment completed. Goal met: 4 liters removed without interruption in UF.  All blood was returned.  POST-DIALYSIS:  07/05/22 1830  Vital Signs  Temp 98.2 F (36.8 C)  Temp Source Oral  Resp 16  BP (!) 158/84  BP Location Left Arm  BP Method Automatic  Oxygen Therapy  SpO2 98 %  O2 Device Nasal Cannula  O2 Flow Rate (L/min) 2 L/min  Dialysis Weight  Weight 79.9 kg  Type of Weight Post-Dialysis  Post Treatment  Dialyzer Clearance Lightly streaked  Duration of HD Treatment -hour(s) 3.75 hour(s)  Hemodialysis Intake (mL) 0 mL  Liters Processed 79.8  Fluid Removed (mL) 4000 mL  Tolerated HD Treatment Yes  Post-Hemodialysis Comments Goal met   Report given to Jeanmarie Plant, RN.   Rockwell Alexandria, RN AP K[facility]

## 2022-07-05 NOTE — Progress Notes (Signed)
Progress Note  Patient Name: Matthew Robles Date of Encounter: 07/05/2022  Primary Cardiologist: Carlyle Dolly, MD  Subjective   No chest pain or shortness of breath at rest.  Leg swelling significantly improved.  Inpatient Medications    Scheduled Meds:  sodium chloride   Intravenous Once   amLODipine  10 mg Oral Daily   calcitRIOL  0.5 mcg Oral Daily   carvedilol  50 mg Oral BID WC   Chlorhexidine Gluconate Cloth  6 each Topical Daily   Chlorhexidine Gluconate Cloth  6 each Topical Q0600   darbepoetin (ARANESP) injection - DIALYSIS  200 mcg Subcutaneous Q Mon-1800   furosemide  80 mg Intravenous Daily   heparin  5,000 Units Subcutaneous Q8H   hydrALAZINE  75 mg Oral Q8H   insulin aspart  0-6 Units Subcutaneous TID WC   insulin aspart  2 Units Subcutaneous TID WC   isosorbide mononitrate  30 mg Oral Daily   sacubitril-valsartan  1 tablet Oral BID    PRN Meds: acetaminophen **OR** acetaminophen, albuterol, alteplase, bisacodyl, fentaNYL (SUBLIMAZE) injection, guaiFENesin-dextromethorphan, heparin, heparin, hydrALAZINE, labetalol, ondansetron **OR** ondansetron (ZOFRAN) IV, oxyCODONE, traZODone   Vital Signs    Vitals:   07/05/22 0626 07/05/22 0700 07/05/22 0753 07/05/22 0815  BP: (!) 178/76 (!) 174/95  (!) 192/90  Pulse: 85 90  90  Resp: 14 16    Temp:   98.3 F (36.8 C)   TempSrc:   Oral   SpO2: 94% 97%    Weight:      Height:        Intake/Output Summary (Last 24 hours) at 07/05/2022 0825 Last data filed at 07/05/2022 0714 Gross per 24 hour  Intake --  Output 1200 ml  Net -1200 ml   Filed Weights   07/03/22 0500 07/04/22 0401 07/05/22 0500  Weight: 80.3 kg 82.8 kg 84.2 kg    Telemetry    Sinus rhythm.  Personally reviewed.  ECG    No ECG reviewed.  Physical Exam   GEN: No acute distress.   Neck: No JVD. Cardiac: RRR, no murmur or gallop.  Respiratory: Nonlabored. Clear to auscultation bilaterally. GI: Soft, nontender, bowel sounds  present. MS: Trace lower leg edema; No deformity. Neuro:  Nonfocal. Psych: Alert and oriented x 3. Normal affect.  Labs    Chemistry Recent Labs  Lab 07/03/22 0445 07/04/22 0343 07/05/22 0331  NA 133* 133* 132*  K 3.5 3.7 4.0  CL 97* 98 97*  CO2 26 23 23  $ GLUCOSE 151* 107* 101*  BUN 21 29* 35*  CREATININE 4.97* 6.18* 7.24*  CALCIUM 7.3* 7.3* 7.6*  ALBUMIN 1.9* 1.8* 2.1*  GFRNONAA 12* 9* 8*  ANIONGAP 10 12 12     $ Hematology Recent Labs  Lab 06/29/22 0438 06/30/22 0346 07/02/22 1827  WBC 10.3 11.1* 12.6*  RBC 2.71* 2.83* 2.84*  HGB 8.1* 8.5* 8.6*  HCT 25.1* 26.0* 26.9*  MCV 92.6 91.9 94.7  MCH 29.9 30.0 30.3  MCHC 32.3 32.7 32.0  RDW 14.8 14.5 14.9  PLT 218 236 265    Cardiac Enzymes Recent Labs  Lab 06/24/22 0532  TROPONINIHS 76*    Radiology    DG Chest Port 1 View  Result Date: 07/04/2022 CLINICAL DATA:  Shortness of breath. EXAM: PORTABLE CHEST 1 VIEW COMPARISON:  06/28/2022 FINDINGS: 0540 hours. The lungs are clear without focal pneumonia, edema, pneumothorax or pleural effusion. Cardiopericardial silhouette is at upper limits of normal for size. Retrocardiac left base collapse/consolidation is similar to mildly  improved in the interval. Tiny bilateral pleural effusions evident. Right central line tip overlies the mid SVC level. Telemetry leads overlie the chest. IMPRESSION: 1. Similar to mildly improved retrocardiac left base collapse/consolidation. 2. Tiny bilateral pleural effusions. Electronically Signed   By: Misty Stanley M.D.   On: 07/04/2022 07:57    Cardiac Studies   Echocardiogram 06/24/2022:  1. Left ventricular ejection fraction, by estimation, is 45 to 50%. The  left ventricle has mildly decreased function. The left ventricle  demonstrates global hypokinesis. There is moderate concentric left  ventricular hypertrophy. Left ventricular  diastolic parameters are consistent with Grade II diastolic dysfunction  (pseudonormalization).   2.  Right ventricular systolic function is mildly reduced. The right  ventricular size is normal. Tricuspid regurgitation signal is inadequate  for assessing PA pressure.   3. Left atrial size was severely dilated.   4. Right atrial size was moderately dilated.   5. Left pleural effusion noted.   6. The mitral valve is degenerative. Mild mitral valve regurgitation.   7. The aortic valve is tricuspid. There is mild calcification of the  aortic valve. Aortic valve regurgitation is trivial. Aortic valve  sclerosis/calcification is present, without any evidence of aortic  stenosis.   8. The inferior vena cava is dilated in size with >50% respiratory  variability, suggesting right atrial pressure of 8 mmHg.   Assessment & Plan    1.  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  Acute on chronic presentation in the setting of hypertensive emergency and CKD progressing to ESRD.  Focusing on GDMT as tolerated.  Fluid status improved.  2.  Hypertensive emergency with slowly improving blood pressure trend.  No evidence of renal artery stenosis by prior renal artery Dopplers in 2020.  3.  Progressive CKD, now ESRD.  Dialysis catheter right chest, tolerating HD per nephrology with plan to continue with outpatient locally.  Potassium 4.0 and creatinine 7.24.  4.  Type 2 diabetes mellitus, currently on insulin.  Chart reviewed.  Stopping Aldactone given ESRD and age over 47.  Not optimal candidate for SGLT2 inhibitor either.  Increase Coreg to 50 mg twice daily, increase hydralazine to 75 mg 3 times daily.  Continue Imdur, Entresto, and Norvasc.  Switch Lasix to 80 mg oral daily.  Patient states he will be getting hemodialysis session today and potentially discharged to continue outpatient follow-up.  If that is the case, would go with current medication changes and arrange follow-up with Dr. Harl Bowie or APP in the next few weeks.  Signed, Rozann Lesches, MD  07/05/2022, 8:25 AM

## 2022-07-05 NOTE — Progress Notes (Signed)
Patient ID: Matthew Robles, male   DOB: [birth year], 67 y.o.   MRN: [id number] S: No c/o.  For HD today.  Has outpt HD [facility] TDC R IJ THS.  BPs still up meds being titrated. Marland Kitchen O:BP (!) 192/90   Pulse 90   Temp 98.3 F (36.8 C) (Oral)   Resp 16   Ht 6' (1.829 m)   Wt 84.2 kg   SpO2 97%   BMI 25.18 kg/m   Intake/Output Summary (Last 24 hours) at 07/05/2022 0855 Last data filed at 07/05/2022 0714 Gross per 24 hour  Intake --  Output 1200 ml  Net -1200 ml    Intake/Output: I/O last 3 completed shifts: In: -  Out: 1400 [Urine:1400]  Intake/Output this shift:  Total I/O In: -  Out: 250 [Urine:250] Weight change: 1.4 kg Gen:NAD CVS: RRR Resp: CTA Abd:+BS, soft, NT/ND Ext: no edema R IJ TDC c/d/i  Recent Labs  Lab 06/29/22 0438 06/30/22 0346 07/01/22 0442 07/02/22 0530 07/03/22 0445 07/04/22 0343 07/05/22 0331  NA 131* 131* 133* 134* 133* 133* 132*  K 3.3* 3.2* 3.3* 3.2* 3.5 3.7 4.0  CL 98 95* 97* 101 97* 98 97*  CO2 25 23 25 24 26 23 23  $ GLUCOSE 109* 82 107* 143* 151* 107* 101*  BUN 35* 41* 30* 35* 21 29* 35*  CREATININE 6.81* 7.80* 5.43* 6.62* 4.97* 6.18* 7.24*  ALBUMIN 1.7* 1.8* 1.8* 1.6* 1.9* 1.8* 2.1*  CALCIUM 6.4* 6.7* 6.9* 6.9* 7.3* 7.3* 7.6*  PHOS 4.7* 5.8* 3.8 4.1 2.9 3.5 4.3    Liver Function Tests: Recent Labs  Lab 07/03/22 0445 07/04/22 0343 07/05/22 0331  ALBUMIN 1.9* 1.8* 2.1*    No results for input(s): "LIPASE", "AMYLASE" in the last 168 hours. No results for input(s): "AMMONIA" in the last 168 hours. CBC: Recent Labs  Lab 06/29/22 0438 06/30/22 0346 07/02/22 1827  WBC 10.3 11.1* 12.6*  HGB 8.1* 8.5* 8.6*  HCT 25.1* 26.0* 26.9*  MCV 92.6 91.9 94.7  PLT 218 236 265    Cardiac Enzymes: No results for input(s): "CKTOTAL", "CKMB", "CKMBINDEX", "TROPONINI" in the last 168 hours. CBG: Recent Labs  Lab 07/04/22 1151 07/04/22 1642 07/04/22 2056 07/05/22 0257 07/05/22 0726  GLUCAP 133* 139* 123* 101* 106*     Iron  Studies: No results for input(s): "IRON", "TIBC", "TRANSFERRIN", "FERRITIN" in the last 72 hours. Studies/Results: DG Chest Port 1 View  Result Date: 07/04/2022 CLINICAL DATA:  Shortness of breath. EXAM: PORTABLE CHEST 1 VIEW COMPARISON:  06/28/2022 FINDINGS: 0540 hours. The lungs are clear without focal pneumonia, edema, pneumothorax or pleural effusion. Cardiopericardial silhouette is at upper limits of normal for size. Retrocardiac left base collapse/consolidation is similar to mildly improved in the interval. Tiny bilateral pleural effusions evident. Right central line tip overlies the mid SVC level. Telemetry leads overlie the chest. IMPRESSION: 1. Similar to mildly improved retrocardiac left base collapse/consolidation. 2. Tiny bilateral pleural effusions. Electronically Signed   By: Misty Stanley M.D.   On: 07/04/2022 07:57    sodium chloride   Intravenous Once   amLODipine  10 mg Oral Daily   calcitRIOL  0.5 mcg Oral Daily   carvedilol  50 mg Oral BID WC   Chlorhexidine Gluconate Cloth  6 each Topical Daily   Chlorhexidine Gluconate Cloth  6 each Topical Q0600   darbepoetin (ARANESP) injection - DIALYSIS  200 mcg Subcutaneous Q Mon-1800   furosemide  80 mg Oral Daily   heparin  5,000 Units Subcutaneous Q8H  hydrALAZINE  75 mg Oral Q8H   insulin aspart  0-6 Units Subcutaneous TID WC   insulin aspart  2 Units Subcutaneous TID WC   isosorbide mononitrate  30 mg Oral Daily   sacubitril-valsartan  1 tablet Oral BID    BMET    Component Value Date/Time   NA 132 (L) 07/05/2022 0331   K 4.0 07/05/2022 0331   CL 97 (L) 07/05/2022 0331   CO2 23 07/05/2022 0331   GLUCOSE 101 (H) 07/05/2022 0331   BUN 35 (H) 07/05/2022 0331   CREATININE 7.24 (H) 07/05/2022 0331   CALCIUM 7.6 (L) 07/05/2022 0331   GFRNONAA 8 (L) 07/05/2022 0331   GFRAA 35 (L) 10/31/2018 1457   CBC    Component Value Date/Time   WBC 12.6 (H) 07/02/2022 1827   RBC 2.84 (L) 07/02/2022 1827   HGB 8.6 (L) 07/02/2022  1827   HCT 26.9 (L) 07/02/2022 1827   PLT 265 07/02/2022 1827   MCV 94.7 07/02/2022 1827   MCH 30.3 07/02/2022 1827   MCHC 32.0 07/02/2022 1827   RDW 14.9 07/02/2022 1827   LYMPHSABS 0.5 (L) 06/26/2022 0411   MONOABS 0.8 06/26/2022 0411   EOSABS 1.5 (H) 06/26/2022 0411   BASOSABS 0.1 06/26/2022 0411    Assessment/Plan:   New ESRD - had progressive CKD and now dialysis dependent.  S/p R[facility] 06/25/22 and first HD on 2/2, Tolerating dialysis well.  Plan is [facility]  THS.  HD today, then possible DC thereafter.  HTN emergency - improved with nicardipine gtt and UF with HD.  Continue to UF with HD.  Should be on spironolactone 25 mg daily as he has a history of hyperaldosteronism and persistent hypokalemia.  Further titration as outpt.  Acute hypoxic respiratory failure - improved, still on 2L.  Covid, flud, and RSV negative.  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 Cont calcitriol, do not continue at DC Anemia of CKD stage V - on ESA qMon; iron, s/p PRBC's. Hyponatremia - impaired free water excretion and will follow with HD. Hypokalemia - improving.  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 Will use 4K bath with HD.  Has history of elevated aldosterone/PRA in the past.  Cont on MRB and trend K.    Vascular access - has RIJ TDC placed 06/25/22 by Dr. Constance Haw.  Seen by Dr. Donnetta Hutching and will arrange outpatient vein mapping and possible left arm AVF. Disposition - has been accepted at [facility] starting 07/06/22 if stable for discharge.    Rexene Agent, MD  [facility]

## 2022-07-05 NOTE — Progress Notes (Signed)
Patient refused to wear BIPAP last night and per night shift RT has refused to wear it the last couple of nights. Machine remains at bedside if and when needed.

## 2022-07-05 NOTE — Progress Notes (Signed)
Ok to give AM meds per Levada Dy RN, dialysis nurse due to she is not sure when she will get to patient today at this time.

## 2022-07-07 ENCOUNTER — Other Ambulatory Visit: Payer: Self-pay

## 2022-07-07 DIAGNOSIS — N186 End stage renal disease: Secondary | ICD-10-CM

## 2022-07-08 ENCOUNTER — Encounter (HOSPITAL_COMMUNITY)
Admit: 2022-07-08 | Discharge: 2022-07-08 | Disposition: A | Discharge status: Home / Self Care | Payer: Self-pay | Attending: Vascular Surgery | Admitting: Vascular Surgery

## 2022-07-08 DIAGNOSIS — N186 End stage renal disease: Secondary | ICD-10-CM

## 2022-07-08 HISTORY — DX: Chronic kidney disease, unspecified: N18.9

## 2022-07-08 NOTE — Pre-Procedure Instructions (Signed)
Attempted to give pt information regarding surgery on Tuesday. Was told by pt's mother that he was @ dialyis and would call when he got home.

## 2022-07-09 LAB — ALDOSTERONE + RENIN ACTIVITY W/ RATIO
ALDO / PRA Ratio: 2.7 (ref 0.0–30.0)
Aldosterone: 1.3 ng/dL (ref 0.0–30.0)
PRA LC/MS/MS: 0.478 ng/mL/hr (ref 0.167–5.380)

## 2022-07-12 ENCOUNTER — Encounter (HOSPITAL_COMMUNITY): Payer: Self-pay

## 2022-07-13 ENCOUNTER — Ambulatory Visit (HOSPITAL_COMMUNITY)
Admission: RE | Admit: 2022-07-13 | Discharge: 2022-07-13 | Disposition: A | Discharge status: Home / Self Care | Payer: Self-pay | Source: Ambulatory Visit | Attending: Vascular Surgery | Admitting: Vascular Surgery

## 2022-07-13 ENCOUNTER — Encounter (HOSPITAL_COMMUNITY): Payer: Self-pay | Admitting: Vascular Surgery

## 2022-07-13 ENCOUNTER — Encounter (HOSPITAL_COMMUNITY)
Admission: RE | Disposition: A | Discharge status: Home / Self Care | Payer: Self-pay | Source: Ambulatory Visit | Attending: Vascular Surgery

## 2022-07-13 ENCOUNTER — Ambulatory Visit (HOSPITAL_BASED_OUTPATIENT_CLINIC_OR_DEPARTMENT_OTHER): Payer: Self-pay | Admitting: Anesthesiology

## 2022-07-13 ENCOUNTER — Ambulatory Visit (HOSPITAL_COMMUNITY): Payer: Self-pay | Admitting: Anesthesiology

## 2022-07-13 ENCOUNTER — Other Ambulatory Visit: Payer: Self-pay

## 2022-07-13 DIAGNOSIS — Z87891 Personal history of nicotine dependence: Secondary | ICD-10-CM

## 2022-07-13 DIAGNOSIS — I340 Nonrheumatic mitral (valve) insufficiency: Secondary | ICD-10-CM | POA: Insufficient documentation

## 2022-07-13 DIAGNOSIS — I132 Hypertensive heart and chronic kidney disease with heart failure and with stage 5 chronic kidney disease, or end stage renal disease: Secondary | ICD-10-CM

## 2022-07-13 DIAGNOSIS — I6930 Unspecified sequelae of cerebral infarction: Secondary | ICD-10-CM | POA: Insufficient documentation

## 2022-07-13 DIAGNOSIS — I161 Hypertensive emergency: Secondary | ICD-10-CM

## 2022-07-13 DIAGNOSIS — I509 Heart failure, unspecified: Secondary | ICD-10-CM | POA: Insufficient documentation

## 2022-07-13 DIAGNOSIS — N186 End stage renal disease: Secondary | ICD-10-CM

## 2022-07-13 DIAGNOSIS — I700 Atherosclerosis of aorta: Secondary | ICD-10-CM | POA: Insufficient documentation

## 2022-07-13 DIAGNOSIS — E1122 Type 2 diabetes mellitus with diabetic chronic kidney disease: Secondary | ICD-10-CM

## 2022-07-13 DIAGNOSIS — Z79899 Other long term (current) drug therapy: Secondary | ICD-10-CM | POA: Insufficient documentation

## 2022-07-13 DIAGNOSIS — J90 Pleural effusion, not elsewhere classified: Secondary | ICD-10-CM | POA: Insufficient documentation

## 2022-07-13 DIAGNOSIS — N185 Chronic kidney disease, stage 5: Secondary | ICD-10-CM

## 2022-07-13 DIAGNOSIS — Z992 Dependence on renal dialysis: Secondary | ICD-10-CM | POA: Insufficient documentation

## 2022-07-13 HISTORY — PX: AV FISTULA PLACEMENT: SHX1204

## 2022-07-13 LAB — HEMOGLOBIN AND HEMATOCRIT, BLOOD
HCT: 29.4 % — ABNORMAL LOW (ref 39.0–52.0)
Hemoglobin: 9.1 g/dL — ABNORMAL LOW (ref 13.0–17.0)

## 2022-07-13 LAB — BASIC METABOLIC PANEL
Anion gap: 12 (ref 5–15)
BUN: 33 mg/dL — ABNORMAL HIGH (ref 8–23)
CO2: 23 mmol/L (ref 22–32)
Calcium: 7.7 mg/dL — ABNORMAL LOW (ref 8.9–10.3)
Chloride: 104 mmol/L (ref 98–111)
Creatinine, Ser: 7.12 mg/dL — ABNORMAL HIGH (ref 0.61–1.24)
GFR, Estimated: 8 mL/min — ABNORMAL LOW (ref >60)
Glucose, Bld: 96 mg/dL (ref 70–99)
Potassium: 4.2 mmol/L (ref 3.5–5.1)
Sodium: 139 mmol/L (ref 135–145)

## 2022-07-13 LAB — GLUCOSE, CAPILLARY
Glucose-Capillary: 86 mg/dL (ref 70–99)
Glucose-Capillary: 82 mg/dL (ref 70–99)
Glucose-Capillary: 100 mg/dL — ABNORMAL HIGH (ref 70–99)

## 2022-07-13 SURGERY — ARTERIOVENOUS (AV) FISTULA CREATION
Anesthesia: General | Site: Arm Lower | Laterality: Left

## 2022-07-13 MED ORDER — CHLORHEXIDINE GLUCONATE 4 % EX LIQD: 60.0000 mL | Freq: Once | CUTANEOUS | Status: DC

## 2022-07-13 MED ORDER — ORAL CARE MOUTH RINSE: 15.0000 mL | Freq: Once | OROMUCOSAL | Status: AC

## 2022-07-13 MED ORDER — ONDANSETRON HCL 4 MG/2ML IJ SOLN: 4.0000 mg | Freq: Once | INTRAMUSCULAR | Status: DC | PRN

## 2022-07-13 MED ORDER — FENTANYL CITRATE (PF) 100 MCG/2ML IJ SOLN
INTRAMUSCULAR | Status: DC | PRN
  Administered 2022-07-13 (×2): 50 ug via INTRAVENOUS

## 2022-07-13 MED ORDER — HEPARIN SODIUM (PORCINE) 1000 UNIT/ML IJ SOLN
INTRAMUSCULAR | Status: AC
  Filled 2022-07-13: qty 6

## 2022-07-13 MED ORDER — 0.9 % SODIUM CHLORIDE (POUR BTL) OPTIME
TOPICAL | Status: DC | PRN
  Administered 2022-07-13: 500 mL

## 2022-07-13 MED ORDER — LIDOCAINE-EPINEPHRINE 0.5 %-1:200000 IJ SOLN
INTRAMUSCULAR | Status: AC
  Filled 2022-07-13: qty 1

## 2022-07-13 MED ORDER — FENTANYL CITRATE (PF) 100 MCG/2ML IJ SOLN
INTRAMUSCULAR | Status: AC
  Filled 2022-07-13: qty 2

## 2022-07-13 MED ORDER — SODIUM CHLORIDE 0.9 % IV SOLN: INTRAVENOUS | Status: DC

## 2022-07-13 MED ORDER — OXYCODONE-ACETAMINOPHEN 5-325 MG PO TABS: 1.0000 | ORAL_TABLET | Freq: Four times a day (QID) | ORAL | 0 refills | Status: DC | PRN

## 2022-07-13 MED ORDER — CEFAZOLIN SODIUM-DEXTROSE 2-4 GM/100ML-% IV SOLN
2.0000 g | INTRAVENOUS | Status: AC
  Administered 2022-07-13: 2 g via INTRAVENOUS
  Filled 2022-07-13: qty 100

## 2022-07-13 MED ORDER — LIDOCAINE-EPINEPHRINE 0.5 %-1:200000 IJ SOLN
INTRAMUSCULAR | Status: DC | PRN
  Administered 2022-07-13: 10 mL

## 2022-07-13 MED ORDER — CHLORHEXIDINE GLUCONATE 0.12 % MT SOLN
15.0000 mL | Freq: Once | OROMUCOSAL | Status: AC
  Administered 2022-07-13: 15 mL via OROMUCOSAL

## 2022-07-13 MED ORDER — MIDAZOLAM HCL 5 MG/5ML IJ SOLN
INTRAMUSCULAR | Status: DC | PRN
  Administered 2022-07-13: 1 mg via INTRAVENOUS

## 2022-07-13 MED ORDER — MIDAZOLAM HCL 2 MG/2ML IJ SOLN
INTRAMUSCULAR | Status: AC
  Filled 2022-07-13: qty 2

## 2022-07-13 MED ORDER — HEPARIN 6000 UNIT IRRIGATION SOLUTION
Status: DC | PRN
  Administered 2022-07-13: 1

## 2022-07-13 MED ORDER — HYDROMORPHONE HCL 1 MG/ML IJ SOLN: 0.2500 mg | INTRAMUSCULAR | Status: DC | PRN

## 2022-07-13 MED ORDER — DEXMEDETOMIDINE HCL IN NACL 80 MCG/20ML IV SOLN
INTRAVENOUS | Status: DC | PRN
  Administered 2022-07-13: 8 ug via BUCCAL

## 2022-07-13 MED ORDER — PROPOFOL 500 MG/50ML IV EMUL
INTRAVENOUS | Status: DC | PRN
  Administered 2022-07-13: 100 ug/kg/min via INTRAVENOUS

## 2022-07-13 SURGICAL SUPPLY — 41 items
ADH SKN CLS APL DERMABOND .7 (GAUZE/BANDAGES/DRESSINGS) ×1
ARMBAND PINK RESTRICT EXTREMIT (MISCELLANEOUS) ×1 IMPLANT
BAG HAMPER (MISCELLANEOUS) ×1 IMPLANT
CANNULA VESSEL 3MM 2 BLNT TIP (CANNULA) ×1 IMPLANT
CLIP LIGATING EXTRA MED SLVR (CLIP) ×1 IMPLANT
CLIP LIGATING EXTRA SM BLUE (MISCELLANEOUS) ×1 IMPLANT
COVER LIGHT HANDLE STERIS (MISCELLANEOUS) ×2 IMPLANT
COVER MAYO STAND XLG (MISCELLANEOUS) ×1 IMPLANT
COVER PROBE U/S 5X48 (MISCELLANEOUS) IMPLANT
DECANTER SPIKE VIAL GLASS SM (MISCELLANEOUS) ×1 IMPLANT
DERMABOND ADVANCED .7 DNX12 (GAUZE/BANDAGES/DRESSINGS) ×1 IMPLANT
ELECT REM PT RETURN 9FT ADLT (ELECTROSURGICAL) ×1
ELECTRODE REM PT RTRN 9FT ADLT (ELECTROSURGICAL) ×1 IMPLANT
GAUZE SPONGE 4X4 12PLY STRL (GAUZE/BANDAGES/DRESSINGS) ×1 IMPLANT
GLOVE BIO SURGEON STRL SZ7 (GLOVE) IMPLANT
GLOVE BIOGEL PI IND STRL 6.5 (GLOVE) IMPLANT
GLOVE BIOGEL PI IND STRL 7.0 (GLOVE) ×2 IMPLANT
GLOVE ECLIPSE 7.0 STRL STRAW (GLOVE) IMPLANT
GLOVE SURG MICRO LTX SZ7.5 (GLOVE) ×1 IMPLANT
GOWN STRL REUS W/TWL LRG LVL3 (GOWN DISPOSABLE) ×3 IMPLANT
IV NS 500ML (IV SOLUTION) ×1
IV NS 500ML BAXH (IV SOLUTION) ×1 IMPLANT
KIT BLADEGUARD II DBL (SET/KITS/TRAYS/PACK) ×1 IMPLANT
KIT TURNOVER KIT A (KITS) ×1 IMPLANT
MANIFOLD NEPTUNE II (INSTRUMENTS) ×1 IMPLANT
MARKER SKIN DUAL TIP RULER LAB (MISCELLANEOUS) ×2 IMPLANT
NDL HYPO 18GX1.5 BLUNT FILL (NEEDLE) ×1 IMPLANT
NEEDLE HYPO 18GX1.5 BLUNT FILL (NEEDLE) ×1 IMPLANT
NS IRRIG 1000ML POUR BTL (IV SOLUTION) ×1 IMPLANT
PACK CV ACCESS (CUSTOM PROCEDURE TRAY) ×1 IMPLANT
PAD ARMBOARD 7.5X6 YLW CONV (MISCELLANEOUS) ×1 IMPLANT
SET BASIN LINEN APH (SET/KITS/TRAYS/PACK) ×1 IMPLANT
SOL PREP POV-IOD 4OZ 10% (MISCELLANEOUS) ×1 IMPLANT
SOL PREP PROV IODINE SCRUB 4OZ (MISCELLANEOUS) ×1 IMPLANT
SUT PROLENE 6 0 CC (SUTURE) ×1 IMPLANT
SUT SILK 2 0 SH (SUTURE) IMPLANT
SUT VIC AB 3-0 SH 27 (SUTURE) ×1
SUT VIC AB 3-0 SH 27X BRD (SUTURE) ×1 IMPLANT
SYR 10ML LL (SYRINGE) ×1 IMPLANT
SYR 50ML LL SCALE MARK (SYRINGE) ×1 IMPLANT
UNDERPAD 30X36 HEAVY ABSORB (UNDERPADS AND DIAPERS) ×1 IMPLANT

## 2022-07-13 NOTE — Discharge Instructions (Signed)
[facility]  Discharge Instructions  AV Fistula or Graft Surgery for Dialysis Access  Please refer to the following instructions for your post-procedure care. Your surgeon or physician assistant will discuss any changes with you.  Activity  You may drive the day following your surgery, if you are comfortable and no longer taking prescription pain medication. Resume full activity as the soreness in your incision resolves.  Bathing/Showering  You may shower after you go home. Keep your incision dry for 48 hours. Do not soak in a bathtub, hot tub, or swim until the incision heals completely. You may not shower if you have a hemodialysis catheter.  Incision Care  Clean your incision with mild soap and water after 48 hours. Pat the area dry with a clean towel. You do not need a bandage unless otherwise instructed. Do not apply any ointments or creams to your incision. You may have skin glue on your incision. Do not peel it off. It will come off on its own in about one week. Your arm may swell a bit after surgery. To reduce swelling use pillows to elevate your arm so it is above your heart. Your doctor will tell you if you need to lightly wrap your arm with an ACE bandage.  Diet  Resume your normal diet. There are not special food restrictions following this procedure. In order to heal from your surgery, it is CRITICAL to get adequate nutrition. Your body requires vitamins, minerals, and protein. Vegetables are the best source of vitamins and minerals. Vegetables also provide the perfect balance of protein. Processed food has little nutritional value, so try to avoid this.  Medications  Resume taking all of your medications. If your incision is causing pain, you may take over-the counter pain relievers such as acetaminophen (Tylenol). If you were prescribed a stronger pain medication, please be aware these medications can cause nausea and constipation. Prevent  nausea by taking the medication with a snack or meal. Avoid constipation by drinking plenty of fluids and eating foods with high amount of fiber, such as fruits, vegetables, and grains.  Do not take Tylenol if you are taking prescription pain medications.  Follow up Your surgeon may want to see you in the office following your access surgery. If so, this will be arranged at the time of your surgery.  Please call us immediately for any of the following conditions:  Increased pain, redness, drainage (pus) from your incision site Fever of 101 degrees or higher Severe or worsening pain at your incision site Hand pain or numbness.  Reduce your risk of vascular disease:  Stop smoking. If you would like help, call [facility] at 1-800-QUIT-NOW 254-053-1658) or [place] at [place] your cholesterol Maintain a desired weight Control your diabetes Keep your blood pressure down  Dialysis  It will take several weeks to several months for your new dialysis access to be ready for use. Your surgeon will determine when it is okay to use it. Your nephrologist will continue to direct your dialysis. You can continue to use your Permcath until your new access is ready for use.   07/13/2022 Angad Roesel [id number] April 18, 1956  Surgeon(s): Kaven Cumbie, Arvilla Meres, MD  Procedure(s): LEFT ARM ARTERIOVENOUS (AV) FISTULA VERSUS ARTERIOVENOUS GRAFT CREATION   May stick graft immediately   May stick graft on designated area only:    Do not stick fistula for 12 weeks    If you have any questions, please call the office  at [phone].

## 2022-07-13 NOTE — Transfer of Care (Signed)
Immediate Anesthesia Transfer of Care Note  Patient: Matthew Robles  Procedure(s) Performed: LEFT ARM ARTERIOVENOUS (AV) FISTULA VERSUS ARTERIOVENOUS GRAFT CREATION (Left: Arm Lower)  Patient Location: PACU  Anesthesia Type:MAC  Level of Consciousness: drowsy  Airway & Oxygen Therapy: Patient Spontanous Breathing and Patient connected to face mask oxygen  Post-op Assessment: Report given to RN and Post -op Vital signs reviewed and stable  Post vital signs: Reviewed  Last Vitals:  Vitals Value Taken Time  BP    Temp    Pulse 78 07/13/22 1102  Resp 13 07/13/22 1102  SpO2 91 % 07/13/22 1102  Vitals shown include unvalidated device data.  Last Pain:  Vitals:   07/13/22 0815  PainSc: 0-No pain      Patients Stated Pain Goal: 5 ([id number])  Complications: No notable events documented.

## 2022-07-13 NOTE — Anesthesia Procedure Notes (Signed)
Procedure Name: MAC Date/Time: 07/13/2022 9:37 AM  Performed by: Lieutenant Diego, CRNAPre-anesthesia Checklist: Patient identified, Patient being monitored, Timeout performed, Suction available and Emergency Drugs available Patient Re-evaluated:Patient Re-evaluated prior to induction Oxygen Delivery Method: Simple face mask Preoxygenation: Pre-oxygenation with 100% oxygen Induction Type: IV induction

## 2022-07-13 NOTE — Interval H&P Note (Signed)
History and Physical Interval Note:  07/13/2022 8:53 AM  Matthew Robles  has presented today for surgery, with the diagnosis of ESRD.  The various methods of treatment have been discussed with the patient and family. After consideration of risks, benefits and other options for treatment, the patient has consented to  Procedure(s): LEFT ARM ARTERIOVENOUS (AV) FISTULA VERSUS ARTERIOVENOUS GRAFT CREATION (Left) as a surgical intervention.  The patient's history has been reviewed, patient examined, no change in status, stable for surgery.  I have reviewed the patient's chart and labs.  Questions were answered to the patient's satisfaction.     Curt Jews

## 2022-07-13 NOTE — Anesthesia Postprocedure Evaluation (Signed)
Anesthesia Post Note  Patient: Matthew Robles  Procedure(s) Performed: LEFT ARM ARTERIOVENOUS (AV) FISTULA VERSUS ARTERIOVENOUS GRAFT CREATION (Left: Arm Lower)  Patient location during evaluation: Phase II Anesthesia Type: General Level of consciousness: awake and alert and oriented Pain management: pain level controlled Vital Signs Assessment: post-procedure vital signs reviewed and stable Respiratory status: spontaneous breathing, nonlabored ventilation and respiratory function stable Cardiovascular status: blood pressure returned to baseline and stable Postop Assessment: no apparent nausea or vomiting Anesthetic complications: no  No notable events documented.   Last Vitals:  Vitals:   07/13/22 1230 07/13/22 1232  BP: (!) 147/73 (!) 147/73  Pulse: 78 76  Resp: 14 13  Temp: 36.4 C   SpO2: 92% 90%    Last Pain:  Vitals:   07/13/22 1230  TempSrc: Oral  PainSc: 0-No pain                 Jarrett Chicoine C Emillee Talsma

## 2022-07-13 NOTE — Op Note (Signed)
    OPERATIVE REPORT  DATE OF SURGERY: 07/13/2022  PATIENT: Matthew Robles, 67 y.o. male MRN: [id number]  DOB: July 18, 1955  PRE-OPERATIVE DIAGNOSIS: End-stage renal disease  POST-OPERATIVE DIAGNOSIS:  Same  PROCEDURE: Left for stage brachiobasilic fistula  SURGEON:  Curt Jews, M.D.  PHYSICIAN ASSISTANT: [place] Mole, RNFA  The assistant was needed for exposure and to expedite the case  ANESTHESIA: MAC  EBL: per anesthesia record  Total I/O In: 300 [I.V.:300] Out: -   BLOOD ADMINISTERED: none  DRAINS: none  SPECIMEN: none  COUNTS CORRECT:  YES  PATIENT DISPOSITION:  PACU - hemodynamically stable  PROCEDURE DETAILS: The patient was taken the operating placed supine position where the area of the left arm was prepped draped in usual sterile fashion.  SonoSite ultrasound was used to visualize the surface veins.  The patient had extremely small cephalic vein.  He did have a good size basilic vein.  This branched above the elbow and the largest of the branches kart coursed more medially.  Using local anesthesia, incision was made over the more medial basilic vein.  This was of good caliber.  Tributary branches were ligated with 3-0 silk ties and divided.  The brachial artery was exposed to a separate incision at the antecubital space and was of large caliber.  The basilic vein was ligated distally and divided and was gently dilated.  A tunnel was created from the brachial artery to the basilic vein and the vein was brought through the tunnel.  The brachial artery was occluded proximally and distally and was opened with an 11 blade and extended longitudinally with Potts scissors.  Small arteriotomy was used to reduce risk of steal.  The vein was cut to the appropriate length and was spatulated and sewn end-to-side to the artery with a running 6-0 Prolene suture.  Clamps were removed and excellent thrill was noted through the fistula.  The wounds irrigated with saline.  Hemostasis  obtained electrocautery.  The wound was closed with 3-0 Vicryl in the subcutaneous and subcuticular tissue.  Sterile dressing was applied and the patient was transferred to the recovery room in stable condition   [name], M.D., [facility] 07/13/2022 11:03 AM  Note: Portions of this report may have been transcribed using voice recognition software.  Every effort has been made to ensure accuracy; however, inadvertent computerized transcription errors may still be present.

## 2022-07-13 NOTE — Anesthesia Preprocedure Evaluation (Addendum)
Anesthesia Evaluation  Patient identified by MRN, date of birth, ID band Patient awake    Reviewed: Allergy & Precautions, NPO status , Patient's Chart, lab work & pertinent test results, reviewed documented beta blocker date and time   History of Anesthesia Complications Negative for: history of anesthetic complications  Airway Mallampati: III  TM Distance: >3 FB Neck ROM: Full    Dental  (+) Dental Advisory Given, Chipped, Missing   Pulmonary former smoker   Pulmonary exam normal breath sounds clear to auscultation       Cardiovascular Exercise Tolerance: Good hypertension, Pt. on medications and Pt. on home beta blockers +CHF  Normal cardiovascular exam Rhythm:Regular Rate:Normal   1. Left ventricular ejection fraction, by estimation, is 45 to 50%. The  left ventricle has mildly decreased function. The left ventricle  demonstrates global hypokinesis. There is moderate concentric left  ventricular hypertrophy. Left ventricular  diastolic parameters are consistent with Grade II diastolic dysfunction  (pseudonormalization).   2. Right ventricular systolic function is mildly reduced. The right  ventricular size is normal. Tricuspid regurgitation signal is inadequate  for assessing PA pressure.   3. Left atrial size was severely dilated.   4. Right atrial size was moderately dilated.   5. Left pleural effusion noted.   6. The mitral valve is degenerative. Mild mitral valve regurgitation.   7. The aortic valve is tricuspid. There is mild calcification of the  aortic valve. Aortic valve regurgitation is trivial. Aortic valve  sclerosis/calcification is present, without any evidence of aortic  stenosis.   8. The inferior vena cava is dilated in size with >50% respiratory  variability, suggesting right atrial pressure of 8 mmHg.      Neuro/Psych CVA (vertigo?), Residual Symptoms    GI/Hepatic   Endo/Other  diabetes, Well  Controlled, Type 2    Renal/GU ESRF and DialysisRenal disease     Musculoskeletal   Abdominal   Peds  Hematology  (+) Blood dyscrasia, anemia   Anesthesia Other Findings   Reproductive/Obstetrics                              Anesthesia Physical Anesthesia Plan  ASA: 3  Anesthesia Plan: General   Post-op Pain Management: Dilaudid IV   Induction: Intravenous  PONV Risk Score and Plan: Propofol infusion and Ondansetron  Airway Management Planned: Nasal Cannula, Natural Airway and Simple Face Mask  Additional Equipment:   Intra-op Plan:   Post-operative Plan:   Informed Consent: I have reviewed the patients History and Physical, chart, labs and discussed the procedure including the risks, benefits and alternatives for the proposed anesthesia with the patient or authorized representative who has indicated his/her understanding and acceptance.     Dental advisory given  Plan Discussed with: CRNA and Surgeon  Anesthesia Plan Comments:          Anesthesia Quick Evaluation

## 2022-07-15 ENCOUNTER — Telehealth: Payer: Self-pay | Admitting: Vascular Surgery

## 2022-07-15 NOTE — Telephone Encounter (Signed)
-----   Message from [name], MD sent at 07/13/2022 11:06 AM EST -----  Left first stage brachiobasilic fistula.  I will need to see him in the office in about 4 weeks with duplex to planned second stage

## 2022-07-19 ENCOUNTER — Encounter (HOSPITAL_COMMUNITY): Payer: Self-pay | Admitting: Vascular Surgery

## 2022-08-03 ENCOUNTER — Encounter: Payer: Self-pay | Admitting: Vascular Surgery

## 2022-08-24 ENCOUNTER — Telehealth: Payer: Self-pay

## 2022-08-24 DIAGNOSIS — N186 End stage renal disease: Secondary | ICD-10-CM

## 2022-08-24 NOTE — Telephone Encounter (Signed)
Betsy with [facility] in [place] called to request a post-op appt since he was at [facility] today.  Reviewed pt's chart, there was difficulty reaching the pt to schedule, returned call for clarification, two identifiers used. Appts for [place] and Dr. Donnetta Hutching scheduled for 08/25/22. Confirmed understanding.

## 2022-08-25 ENCOUNTER — Ambulatory Visit (INDEPENDENT_AMBULATORY_CARE_PROVIDER_SITE_OTHER): Payer: Medicare Other

## 2022-08-25 ENCOUNTER — Encounter: Payer: Self-pay | Admitting: Vascular Surgery

## 2022-08-25 ENCOUNTER — Ambulatory Visit (INDEPENDENT_AMBULATORY_CARE_PROVIDER_SITE_OTHER): Payer: Medicare Other | Admitting: Vascular Surgery

## 2022-08-25 VITALS — BP 169/87 | HR 79 | Temp 98.1°F | Ht 72.0 in | Wt 173.6 lb

## 2022-08-25 DIAGNOSIS — N186 End stage renal disease: Secondary | ICD-10-CM

## 2022-08-25 NOTE — H&P (View-Only) (Signed)
   [facility] Specialist of [place]  Patient name: Matthew Robles MRN: [id number] DOB: 11/25/1955 Sex: male  REASON FOR VISIT: Follow-up for stage brachiobasilic fistula on 07/13/2022  HPI: Matthew Robles is a 67 y.o. male here today for follow-up.  He has dialysis via a right IJ catheter at [facility] on Tuesday Thursday Saturday.  He reports no difficulty with his catheter.  He does not have any steal symptoms in his left hand.  Current Outpatient Medications  Medication Sig Dispense Refill   amLODipine (NORVASC) 10 MG tablet Take 1 tablet (10 mg total) by mouth daily. 30 tablet 1   calcitRIOL (ROCALTROL) 0.5 MCG capsule Take 1 capsule (0.5 mcg total) by mouth daily. 30 capsule 1   carvedilol (COREG) 25 MG tablet Take 2 tablets (50 mg total) by mouth 2 (two) times daily with a meal. 120 tablet 1   furosemide (LASIX) 80 MG tablet Take 1 tablet (80 mg total) by mouth daily. 30 tablet 1   hydrALAZINE (APRESOLINE) 25 MG tablet Take 3 tablets (75 mg total) by mouth every 8 (eight) hours. 270 tablet 1   isosorbide mononitrate (IMDUR) 30 MG 24 hr tablet Take 1 tablet (30 mg total) by mouth daily. 30 tablet 1   sacubitril-valsartan (ENTRESTO) 97-103 MG Take 1 tablet by mouth 2 (two) times daily. 60 tablet 1   No current facility-administered medications for this visit.     PHYSICAL EXAM: Vitals:   08/25/22 1518  BP: (!) 169/87  Pulse: 79  Temp: 98.1 F (36.7 C)  SpO2: 95%  Weight: 173 lb 9.6 oz (78.7 kg)  Height: 6' (1.829 m)    GENERAL: The patient is a well-nourished male, in no acute distress. The vital signs are documented above. Excellent thrill in his left basilic vein fistula.  Incisions at the antecubital area are well-healed.  He has excellent thrill throughout his upper arm.  Duplex shows good size maturation of his basilic fistula.  MEDICAL ISSUES: Good Andrej Spagnoli result following for stage brachiobasilic fistula.  Explained  the need for second stage transposition.  He has dialysis on Tuesday Thursday and Saturday.  I explained that we would coordinate moving his dialysis day off of Tuesday so he can have outpatient surgery at [facility] with me on Tuesday.  We will plan surgery either for 08/31/22 or 09/14/2022.  Our [place] office will coordinate   Yamilka Lopiccolo F. Azariel Banik, MD FACS [facility] [place] Office Tel [phone]  Note: Portions of this report may have been transcribed using voice recognition software.  Every effort has been made to ensure accuracy; however, inadvertent computerized transcription errors may still be present. 

## 2022-08-25 NOTE — Progress Notes (Signed)
   [facility]ist of [place]  Patient name: Matthew Robles MRN: [id number] DOB: 1955-06-30 Sex: male  REASON FOR VISIT: Follow-up for stage brachiobasilic fistula on [id number]  HPI: Matthew Robles is a 67 y.o. male here today for follow-up.  He has dialysis via a right IJ catheter at [facility] on Tuesday Thursday Saturday.  He reports no difficulty with his catheter.  He does not have any steal symptoms in his left hand.  Current Outpatient Medications  Medication Sig Dispense Refill   amLODipine (NORVASC) 10 MG tablet Take 1 tablet (10 mg total) by mouth daily. 30 tablet 1   calcitRIOL (ROCALTROL) 0.5 MCG capsule Take 1 capsule (0.5 mcg total) by mouth daily. 30 capsule 1   carvedilol (COREG) 25 MG tablet Take 2 tablets (50 mg total) by mouth 2 (two) times daily with a meal. 120 tablet 1   furosemide (LASIX) 80 MG tablet Take 1 tablet (80 mg total) by mouth daily. 30 tablet 1   hydrALAZINE (APRESOLINE) 25 MG tablet Take 3 tablets (75 mg total) by mouth every 8 (eight) hours. 270 tablet 1   isosorbide mononitrate (IMDUR) 30 MG 24 hr tablet Take 1 tablet (30 mg total) by mouth daily. 30 tablet 1   sacubitril-valsartan (ENTRESTO) 97-103 MG Take 1 tablet by mouth 2 (two) times daily. 60 tablet 1   No current facility-administered medications for this visit.     PHYSICAL EXAM: Vitals:   08/25/22 1518  BP: (!) 169/87  Pulse: 79  Temp: 98.1 F (36.7 C)  SpO2: 95%  Weight: 173 lb 9.6 oz (78.7 kg)  Height: 6' (1.829 m)    GENERAL: The patient is a well-nourished male, in no acute distress. The vital signs are documented above. Excellent thrill in his left basilic vein fistula.  Incisions at the antecubital area are well-healed.  He has excellent thrill throughout his upper arm.  Duplex shows good size maturation of his basilic fistula.  MEDICAL ISSUES: Good Matthew Robles result following for stage brachiobasilic fistula.  Explained  the need for second stage transposition.  He has dialysis on Tuesday Thursday and Saturday.  I explained that we would coordinate moving his dialysis day off of Tuesday so he can have outpatient surgery at [facility] with me on Tuesday.  We will plan surgery either for 08/31/22 or 09/14/2022.  Our [place] office will coordinate   [name], MD FACS [facility] [place] Office Tel [phone]  Note: Portions of this report may have been transcribed using voice recognition software.  Every effort has been made to ensure accuracy; however, inadvertent computerized transcription errors may still be present.

## 2022-08-26 ENCOUNTER — Other Ambulatory Visit: Payer: Self-pay

## 2022-08-26 DIAGNOSIS — N186 End stage renal disease: Secondary | ICD-10-CM

## 2022-08-27 ENCOUNTER — Encounter (HOSPITAL_COMMUNITY)
Admission: RE | Admit: 2022-08-27 | Discharge: 2022-08-27 | Disposition: A | Discharge status: Home / Self Care | Payer: Medicare Other | Source: Ambulatory Visit | Attending: Vascular Surgery | Admitting: Vascular Surgery

## 2022-08-27 ENCOUNTER — Encounter (HOSPITAL_COMMUNITY): Payer: Self-pay

## 2022-08-27 ENCOUNTER — Other Ambulatory Visit: Payer: Self-pay

## 2022-08-27 VITALS — Ht 72.0 in | Wt 173.6 lb

## 2022-08-27 DIAGNOSIS — N186 End stage renal disease: Secondary | ICD-10-CM

## 2022-08-31 ENCOUNTER — Ambulatory Visit (HOSPITAL_COMMUNITY)
Admission: RE | Admit: 2022-08-31 | Discharge: 2022-08-31 | Disposition: A | Discharge status: Home / Self Care | Payer: Medicare Other | Attending: Vascular Surgery | Admitting: Vascular Surgery

## 2022-08-31 ENCOUNTER — Ambulatory Visit (HOSPITAL_COMMUNITY): Payer: Medicare Other | Admitting: Anesthesiology

## 2022-08-31 ENCOUNTER — Other Ambulatory Visit: Payer: Self-pay

## 2022-08-31 ENCOUNTER — Encounter (HOSPITAL_COMMUNITY): Payer: Self-pay | Admitting: Vascular Surgery

## 2022-08-31 ENCOUNTER — Encounter (HOSPITAL_COMMUNITY)
Admission: RE | Disposition: A | Discharge status: Home / Self Care | Payer: Self-pay | Source: Home / Self Care | Attending: Vascular Surgery

## 2022-08-31 ENCOUNTER — Ambulatory Visit (HOSPITAL_BASED_OUTPATIENT_CLINIC_OR_DEPARTMENT_OTHER): Payer: Medicare Other | Admitting: Anesthesiology

## 2022-08-31 DIAGNOSIS — Z992 Dependence on renal dialysis: Secondary | ICD-10-CM | POA: Diagnosis not present

## 2022-08-31 DIAGNOSIS — N186 End stage renal disease: Secondary | ICD-10-CM | POA: Insufficient documentation

## 2022-08-31 DIAGNOSIS — I080 Rheumatic disorders of both mitral and aortic valves: Secondary | ICD-10-CM | POA: Insufficient documentation

## 2022-08-31 DIAGNOSIS — N185 Chronic kidney disease, stage 5: Secondary | ICD-10-CM | POA: Diagnosis not present

## 2022-08-31 DIAGNOSIS — D631 Anemia in chronic kidney disease: Secondary | ICD-10-CM

## 2022-08-31 DIAGNOSIS — I5021 Acute systolic (congestive) heart failure: Secondary | ICD-10-CM | POA: Diagnosis not present

## 2022-08-31 DIAGNOSIS — I132 Hypertensive heart and chronic kidney disease with heart failure and with stage 5 chronic kidney disease, or end stage renal disease: Secondary | ICD-10-CM | POA: Insufficient documentation

## 2022-08-31 DIAGNOSIS — E1122 Type 2 diabetes mellitus with diabetic chronic kidney disease: Secondary | ICD-10-CM | POA: Insufficient documentation

## 2022-08-31 DIAGNOSIS — Z87891 Personal history of nicotine dependence: Secondary | ICD-10-CM

## 2022-08-31 DIAGNOSIS — Z79899 Other long term (current) drug therapy: Secondary | ICD-10-CM | POA: Diagnosis not present

## 2022-08-31 DIAGNOSIS — I509 Heart failure, unspecified: Secondary | ICD-10-CM | POA: Diagnosis not present

## 2022-08-31 HISTORY — PX: BASCILIC VEIN TRANSPOSITION: SHX5742

## 2022-08-31 LAB — BASIC METABOLIC PANEL
Anion gap: 8 (ref 5–15)
BUN: 21 mg/dL (ref 8–23)
CO2: 25 mmol/L (ref 22–32)
Calcium: 7.4 mg/dL — ABNORMAL LOW (ref 8.9–10.3)
Chloride: 100 mmol/L (ref 98–111)
Creatinine, Ser: 4.52 mg/dL — ABNORMAL HIGH (ref 0.61–1.24)
GFR, Estimated: 13 mL/min — ABNORMAL LOW (ref >60)
Glucose, Bld: 121 mg/dL — ABNORMAL HIGH (ref 70–99)
Potassium: 3.8 mmol/L (ref 3.5–5.1)
Sodium: 133 mmol/L — ABNORMAL LOW (ref 135–145)

## 2022-08-31 LAB — GLUCOSE, CAPILLARY: Glucose-Capillary: 114 mg/dL — ABNORMAL HIGH (ref 70–99)

## 2022-08-31 LAB — HEMOGLOBIN AND HEMATOCRIT, BLOOD
HCT: 25.9 % — ABNORMAL LOW (ref 39.0–52.0)
Hemoglobin: 8.5 g/dL — ABNORMAL LOW (ref 13.0–17.0)

## 2022-08-31 SURGERY — TRANSPOSITION, VEIN, BASILIC
Anesthesia: General | Site: Arm Upper | Laterality: Left

## 2022-08-31 MED ORDER — HEPARIN SODIUM (PORCINE) 1000 UNIT/ML IJ SOLN
INTRAMUSCULAR | Status: AC
  Filled 2022-08-31: qty 5

## 2022-08-31 MED ORDER — ONDANSETRON HCL 4 MG/2ML IJ SOLN
INTRAMUSCULAR | Status: DC | PRN
  Administered 2022-08-31: 4 mg via INTRAVENOUS

## 2022-08-31 MED ORDER — CEFAZOLIN SODIUM-DEXTROSE 2-4 GM/100ML-% IV SOLN
INTRAVENOUS | Status: AC
  Filled 2022-08-31: qty 100

## 2022-08-31 MED ORDER — CEFAZOLIN SODIUM-DEXTROSE 2-4 GM/100ML-% IV SOLN
2.0000 g | INTRAVENOUS | Status: AC
  Administered 2022-08-31: 2 g via INTRAVENOUS

## 2022-08-31 MED ORDER — LIDOCAINE-EPINEPHRINE 0.5 %-1:200000 IJ SOLN
INTRAMUSCULAR | Status: AC
  Filled 2022-08-31: qty 50

## 2022-08-31 MED ORDER — ONDANSETRON HCL 4 MG/2ML IJ SOLN: 4.0000 mg | Freq: Once | INTRAMUSCULAR | Status: DC | PRN

## 2022-08-31 MED ORDER — PHENYLEPHRINE HCL (PRESSORS) 10 MG/ML IV SOLN
INTRAVENOUS | Status: AC
  Filled 2022-08-31: qty 1

## 2022-08-31 MED ORDER — 0.9 % SODIUM CHLORIDE (POUR BTL) OPTIME
TOPICAL | Status: DC | PRN
  Administered 2022-08-31: 1000 mL

## 2022-08-31 MED ORDER — HEPARIN 6000 UNIT IRRIGATION SOLUTION
Status: DC | PRN
  Administered 2022-08-31: 1

## 2022-08-31 MED ORDER — LIDOCAINE HCL (PF) 2 % IJ SOLN
INTRAMUSCULAR | Status: AC
  Filled 2022-08-31: qty 10

## 2022-08-31 MED ORDER — OXYCODONE HCL 5 MG/5ML PO SOLN: 5.0000 mg | Freq: Once | ORAL | Status: DC | PRN

## 2022-08-31 MED ORDER — ACETAMINOPHEN 10 MG/ML IV SOLN
1000.0000 mg | Freq: Once | INTRAVENOUS | Status: AC
  Administered 2022-08-31: 1000 mg via INTRAVENOUS

## 2022-08-31 MED ORDER — MIDAZOLAM HCL 2 MG/2ML IJ SOLN
INTRAMUSCULAR | Status: DC | PRN
  Administered 2022-08-31: 2 mg via INTRAVENOUS

## 2022-08-31 MED ORDER — FENTANYL CITRATE (PF) 100 MCG/2ML IJ SOLN
INTRAMUSCULAR | Status: DC | PRN
  Administered 2022-08-31 (×2): 50 ug via INTRAVENOUS

## 2022-08-31 MED ORDER — CHLORHEXIDINE GLUCONATE 4 % EX LIQD: 60.0000 mL | Freq: Once | CUTANEOUS | Status: DC

## 2022-08-31 MED ORDER — FENTANYL CITRATE (PF) 100 MCG/2ML IJ SOLN
INTRAMUSCULAR | Status: AC
  Filled 2022-08-31: qty 2

## 2022-08-31 MED ORDER — PROPOFOL 10 MG/ML IV BOLUS
INTRAVENOUS | Status: DC | PRN
  Administered 2022-08-31: 180 mg via INTRAVENOUS

## 2022-08-31 MED ORDER — FENTANYL CITRATE PF 50 MCG/ML IJ SOSY
25.0000 ug | PREFILLED_SYRINGE | INTRAMUSCULAR | Status: DC | PRN
  Administered 2022-08-31: 50 ug via INTRAVENOUS
  Filled 2022-08-31: qty 1

## 2022-08-31 MED ORDER — OXYCODONE-ACETAMINOPHEN 5-325 MG PO TABS: 1.0000 | ORAL_TABLET | Freq: Four times a day (QID) | ORAL | 0 refills | Status: DC | PRN

## 2022-08-31 MED ORDER — MIDAZOLAM HCL 2 MG/2ML IJ SOLN
INTRAMUSCULAR | Status: AC
  Filled 2022-08-31: qty 2

## 2022-08-31 MED ORDER — ACETAMINOPHEN 10 MG/ML IV SOLN
INTRAVENOUS | Status: AC
  Filled 2022-08-31: qty 100

## 2022-08-31 MED ORDER — ONDANSETRON HCL 4 MG/2ML IJ SOLN
INTRAMUSCULAR | Status: AC
  Filled 2022-08-31: qty 2

## 2022-08-31 MED ORDER — LIDOCAINE 2% (20 MG/ML) 5 ML SYRINGE
INTRAMUSCULAR | Status: DC | PRN
  Administered 2022-08-31: 100 mg via INTRAVENOUS

## 2022-08-31 MED ORDER — PROPOFOL 10 MG/ML IV BOLUS
INTRAVENOUS | Status: AC
  Filled 2022-08-31: qty 20

## 2022-08-31 MED ORDER — OXYCODONE HCL 5 MG PO TABS: 5.0000 mg | ORAL_TABLET | Freq: Once | ORAL | Status: DC | PRN

## 2022-08-31 MED ORDER — SODIUM CHLORIDE 0.9 % IV SOLN: INTRAVENOUS | Status: DC

## 2022-08-31 SURGICAL SUPPLY — 42 items
ADH SKN CLS APL DERMABOND .7 (GAUZE/BANDAGES/DRESSINGS) ×1
ARMBAND PINK RESTRICT EXTREMIT (MISCELLANEOUS) ×1 IMPLANT
BAG HAMPER (MISCELLANEOUS) ×1 IMPLANT
CANNULA VESSEL 3MM 2 BLNT TIP (CANNULA) ×1 IMPLANT
CLIP LIGATING EXTRA MED SLVR (CLIP) ×1 IMPLANT
CLIP LIGATING EXTRA SM BLUE (MISCELLANEOUS) ×1 IMPLANT
COVER LIGHT HANDLE STERIS (MISCELLANEOUS) ×1 IMPLANT
COVER MAYO STAND XLG (MISCELLANEOUS) ×1 IMPLANT
COVER PROBE U/S 5X48 (MISCELLANEOUS) IMPLANT
DERMABOND ADVANCED .7 DNX12 (GAUZE/BANDAGES/DRESSINGS) ×1 IMPLANT
ELECT REM PT RETURN 9FT ADLT (ELECTROSURGICAL) ×1
ELECTRODE REM PT RTRN 9FT ADLT (ELECTROSURGICAL) ×1 IMPLANT
GAUZE SPONGE 4X4 12PLY STRL (GAUZE/BANDAGES/DRESSINGS) ×1 IMPLANT
GLOVE BIOGEL PI IND STRL 7.0 (GLOVE) ×2 IMPLANT
GLOVE SURG MICRO LTX SZ7.5 (GLOVE) ×1 IMPLANT
GOWN STRL REUS W/TWL LRG LVL3 (GOWN DISPOSABLE) ×3 IMPLANT
IV NS 500ML (IV SOLUTION) ×1
IV NS 500ML BAXH (IV SOLUTION) ×1 IMPLANT
KIT BLADEGUARD II DBL (SET/KITS/TRAYS/PACK) ×1 IMPLANT
KIT TURNOVER KIT A (KITS) ×1 IMPLANT
MANIFOLD NEPTUNE II (INSTRUMENTS) ×1 IMPLANT
MARKER SKIN DUAL TIP RULER LAB (MISCELLANEOUS) ×2 IMPLANT
NDL HYPO 18GX1.5 BLUNT FILL (NEEDLE) ×2 IMPLANT
NEEDLE HYPO 18GX1.5 BLUNT FILL (NEEDLE) ×2 IMPLANT
NS IRRIG 1000ML POUR BTL (IV SOLUTION) ×1 IMPLANT
PACK CV ACCESS (CUSTOM PROCEDURE TRAY) ×1 IMPLANT
PAD ARMBOARD 7.5X6 YLW CONV (MISCELLANEOUS) ×1 IMPLANT
POSITIONER HEAD 8X9X4 ADT (SOFTGOODS) ×1 IMPLANT
SET BASIN LINEN APH (SET/KITS/TRAYS/PACK) ×1 IMPLANT
SOL PREP POV-IOD 4OZ 10% (MISCELLANEOUS) ×1 IMPLANT
SOL PREP PROV IODINE SCRUB 4OZ (MISCELLANEOUS) ×1 IMPLANT
SPONGE T-LAP 18X18 ~~LOC~~+RFID (SPONGE) ×1 IMPLANT
STOCKINETTE 4X48 STRL (DRAPES) ×1 IMPLANT
SUT PROLENE 6 0 CC (SUTURE) ×1 IMPLANT
SUT SILK 2 0 SH (SUTURE) IMPLANT
SUT VIC AB 3-0 SH 27 (SUTURE) ×2
SUT VIC AB 3-0 SH 27X BRD (SUTURE) ×1 IMPLANT
SYR 10ML LL (SYRINGE) ×1 IMPLANT
SYR 50ML LL SCALE MARK (SYRINGE) IMPLANT
SYR BULB IRRIG 60ML STRL (SYRINGE) ×1 IMPLANT
UNDERPAD 30X36 HEAVY ABSORB (UNDERPADS AND DIAPERS) ×1 IMPLANT
WATER STERILE IRR 1000ML POUR (IV SOLUTION) ×1 IMPLANT

## 2022-08-31 NOTE — Transfer of Care (Signed)
Immediate Anesthesia Transfer of Care Note  Patient: Matthew Robles  Procedure(s) Performed: LEFT ARM SECOND STAGE BASILIC VEIN TRANSPOSITION (Left: Arm Upper)  Patient Location: PACU  Anesthesia Type:General  Level of Consciousness: awake, alert , and oriented  Airway & Oxygen Therapy: Patient Spontanous Breathing and Patient connected to face mask oxygen  Post-op Assessment: Report given to RN and Post -op Vital signs reviewed and stable  Post vital signs: Reviewed and stable  Last Vitals:  Vitals Value Taken Time  BP 138/75 08/31/22 1020  Temp    Pulse 84 08/31/22 1024  Resp 20 08/31/22 1024  SpO2 96 % 08/31/22 1024  Vitals shown include unvalidated device data.  Last Pain:  Vitals:   08/31/22 0708  TempSrc: Oral  PainSc: 0-No pain      Patients Stated Pain Goal: 8 (08/31/22 0708)  Complications: No notable events documented.

## 2022-08-31 NOTE — Anesthesia Procedure Notes (Signed)
Procedure Name: LMA Insertion Date/Time: 08/31/2022 7:53 AM  Performed by: [place], Clydene Pugh, CRNAPre-anesthesia Checklist: Patient identified, Emergency Drugs available, Suction available and Patient being monitored Patient Re-evaluated:Patient Re-evaluated prior to induction Oxygen Delivery Method: Circle system utilized Preoxygenation: Pre-oxygenation with 100% oxygen Induction Type: IV induction Ventilation: Mask ventilation without difficulty LMA: LMA inserted LMA Size: 4.0 Number of attempts: 1 Airway Equipment and Method: Bite block Placement Confirmation: positive ETCO2 Tube secured with: Tape Dental Injury: Teeth and Oropharynx as per pre-operative assessment

## 2022-08-31 NOTE — Op Note (Signed)
    OPERATIVE REPORT  DATE OF SURGERY: 08/31/2022  PATIENT: Matthew Robles, 67 y.o. male MRN: [id number]  DOB: 03/23/1956  PRE-OPERATIVE DIAGNOSIS: End-stage renal disease  POST-OPERATIVE DIAGNOSIS:  Same  PROCEDURE: Left second stage basilic vein transposition  SURGEON:  Gretta Began, M.D.  PHYSICIAN ASSISTANT: Canary Brim, RNFA  The assistant was needed for exposure and to expedite the case  ANESTHESIA: LMA  EBL: per anesthesia record  Total I/O In: 300 [I.V.:300] Out: 10 [Blood:10]  BLOOD ADMINISTERED: none  DRAINS: none  SPECIMEN: none  COUNTS CORRECT:  YES  PATIENT DISPOSITION:  PACU - hemodynamically stable  PROCEDURE DETAILS: The patient was taken everyplace supine position the area the left arm and left axilla were prepped draped you sterile fashion.  Incision was made over the prior brachial artery to basilic vein anastomosis.  The vein was mobilized to the level of the prior brachial anastomosis.  3 separate incisions were made on the upper arm over the level of the basilic vein.  The vein was mobilized circumferentially.  Multiple tributary branches were ligated with 3-0 and 2-0 silk ties and divided.  The vein was of good caliber throughout its course.  The vein was occluded near the brachial artery anastomosis with a fistula clamp.  The vein was transected and brought out through the tunnel.  The vein was marked to reduce risk of twisting.  The vein was gently dilated with heparinized saline and was excellent size.  A separate tunnel was then created from the antecubital space to the axillary incision and the vein was brought back through this tunnel.  The vein was cut to the appropriate length and was spatulated and sewn into into itself with a running 6-0 Prolene suture.  Clamps were removed and excellent thrill was noted.  The wounds were irrigated with saline.  Hemostasis was obtained with electrocautery.  The wounds were closed with 3-0 Vicryl in the  subcutaneous and subcuticular tissue.  Sterile dressing was applied and the patient was transferred to the recovery room in stable condition.  He maintained a 3+ radial pulse.   [name], M.D., [facility] 08/31/2022 10:28 AM  Note: Portions of this report may have been transcribed using voice recognition software.  Every effort has been made to ensure accuracy; however, inadvertent computerized transcription errors may still be present.

## 2022-08-31 NOTE — Discharge Instructions (Signed)
Vascular and Vein Specialists of [facility]  Discharge Instructions  AV Fistula or Graft Surgery for Dialysis Access  Please refer to the following instructions for your post-procedure care. Your surgeon or physician assistant will discuss any changes with you.  Activity  You may drive the day following your surgery, if you are comfortable and no longer taking prescription pain medication. Resume full activity as the soreness in your incision resolves.  Bathing/Showering  You may shower after you go home. Keep your incision dry for 48 hours. Do not soak in a bathtub, hot tub, or swim until the incision heals completely. You may not shower if you have a hemodialysis catheter.  Incision Care  Clean your incision with mild soap and water after 48 hours. Pat the area dry with a clean towel. You do not need a bandage unless otherwise instructed. Do not apply any ointments or creams to your incision. You may have skin glue on your incision. Do not peel it off. It will come off on its own in about one week. Your arm may swell a bit after surgery. To reduce swelling use pillows to elevate your arm so it is above your heart. Your doctor will tell you if you need to lightly wrap your arm with an ACE bandage.  Diet  Resume your normal diet. There are not special food restrictions following this procedure. In order to heal from your surgery, it is CRITICAL to get adequate nutrition. Your body requires vitamins, minerals, and protein. Vegetables are the best source of vitamins and minerals. Vegetables also provide the perfect balance of protein. Processed food has little nutritional value, so try to avoid this.  Medications  Resume taking all of your medications. If your incision is causing pain, you may take over-the counter pain relievers such as acetaminophen (Tylenol). If you were prescribed a stronger pain medication, please be aware these medications can cause nausea and constipation. Prevent  nausea by taking the medication with a snack or meal. Avoid constipation by drinking plenty of fluids and eating foods with high amount of fiber, such as fruits, vegetables, and grains.  Do not take Tylenol if you are taking prescription pain medications.  Follow up Your surgeon may want to see you in the office following your access surgery. If so, this will be arranged at the time of your surgery.  Please call us immediately for any of the following conditions:  Increased pain, redness, drainage (pus) from your incision site Fever of 101 degrees or higher Severe or worsening pain at your incision site Hand pain or numbness.  Reduce your risk of vascular disease:  Stop smoking. If you would like help, call [facility] at 1-800-QUIT-NOW (219-079-3889) or [place] at (513)191-9771  Manage your cholesterol Maintain a desired weight Control your diabetes Keep your blood pressure down  Dialysis  It will take several weeks to several months for your new dialysis access to be ready for use. Your surgeon will determine when it is okay to use it. Your nephrologist will continue to direct your dialysis. You can continue to use your Permcath until your new access is ready for use.   08/31/2022 Matthew Robles [id number] April 23, 1956  Surgeon(s): Phylicia Mcgaugh, Kristen Loader, MD  Procedure(s): LEFT ARM SECOND STAGE BASILIC VEIN TRANSPOSITION   May stick graft immediately   May stick graft on designated area only:    Do not stick fistula for 6 weeks    If you have any questions, please call the office at [phone].

## 2022-08-31 NOTE — Anesthesia Preprocedure Evaluation (Signed)
Anesthesia Evaluation  Patient identified by MRN, date of birth, ID band Patient awake    Reviewed: Allergy & Precautions, NPO status , Patient's Chart, lab work & pertinent test results, reviewed documented beta blocker date and time   History of Anesthesia Complications Negative for: history of anesthetic complications  Airway Mallampati: III  TM Distance: >3 FB Neck ROM: Full    Dental  (+) Dental Advisory Given, Chipped, Missing   Pulmonary former smoker   Pulmonary exam normal breath sounds clear to auscultation       Cardiovascular Exercise Tolerance: Good hypertension, Pt. on medications and Pt. on home beta blockers +CHF  Normal cardiovascular exam Rhythm:Regular Rate:Normal   1. Left ventricular ejection fraction, by estimation, is 45 to 50%. The  left ventricle has mildly decreased function. The left ventricle  demonstrates global hypokinesis. There is moderate concentric left  ventricular hypertrophy. Left ventricular  diastolic parameters are consistent with Grade II diastolic dysfunction  (pseudonormalization).   2. Right ventricular systolic function is mildly reduced. The right  ventricular size is normal. Tricuspid regurgitation signal is inadequate  for assessing PA pressure.   3. Left atrial size was severely dilated.   4. Right atrial size was moderately dilated.   5. Left pleural effusion noted.   6. The mitral valve is degenerative. Mild mitral valve regurgitation.   7. The aortic valve is tricuspid. There is mild calcification of the  aortic valve. Aortic valve regurgitation is trivial. Aortic valve  sclerosis/calcification is present, without any evidence of aortic  stenosis.   8. The inferior vena cava is dilated in size with >50% respiratory  variability, suggesting right atrial pressure of 8 mmHg.      Neuro/Psych CVA (vertigo?), Residual Symptoms    GI/Hepatic   Endo/Other  diabetes, Well  Controlled, Type 2    Renal/GU ESRF and DialysisRenal disease     Musculoskeletal   Abdominal   Peds  Hematology  (+) Blood dyscrasia, anemia   Anesthesia Other Findings   Reproductive/Obstetrics                              Anesthesia Physical Anesthesia Plan  ASA: 3  Anesthesia Plan: General   Post-op Pain Management: Dilaudid IV   Induction: Intravenous  PONV Risk Score and Plan: Propofol infusion and Ondansetron  Airway Management Planned: Nasal Cannula, Natural Airway and Simple Face Mask  Additional Equipment:   Intra-op Plan:   Post-operative Plan:   Informed Consent: I have reviewed the patients History and Physical, chart, labs and discussed the procedure including the risks, benefits and alternatives for the proposed anesthesia with the patient or authorized representative who has indicated his/her understanding and acceptance.     Dental advisory given  Plan Discussed with: CRNA and Surgeon  Anesthesia Plan Comments:          Anesthesia Quick Evaluation  

## 2022-08-31 NOTE — Interval H&P Note (Signed)
History and Physical Interval Note:  08/31/2022 7:24 AM  Matthew Robles  has presented today for surgery, with the diagnosis of ESRD.  The various methods of treatment have been discussed with the patient and family. After consideration of risks, benefits and other options for treatment, the patient has consented to  Procedure(s): LEFT ARM SECOND STAGE BASILIC VEIN TRANSPOSITION (Left) as a surgical intervention.  The patient's history has been reviewed, patient examined, no change in status, stable for surgery.  I have reviewed the patient's chart and labs.  Questions were answered to the patient's satisfaction.     Gretta Began

## 2022-09-01 ENCOUNTER — Telehealth: Payer: Self-pay | Admitting: Vascular Surgery

## 2022-09-01 NOTE — Telephone Encounter (Signed)
-----   Message from [name], MD sent at 08/31/2022 10:30 AM EDT -----  Left second stage basilic vein transposition Assistant nurse.  I need to see him in the office in 4 to 6 weeks.  He does not need a duplex

## 2022-09-02 NOTE — Anesthesia Postprocedure Evaluation (Signed)
Anesthesia Post Note  Patient: Matthew Robles  Procedure(s) Performed: LEFT ARM SECOND STAGE BASILIC VEIN TRANSPOSITION (Left: Arm Upper)  Patient location during evaluation: Phase II Anesthesia Type: General Level of consciousness: awake Pain management: pain level controlled Vital Signs Assessment: post-procedure vital signs reviewed and stable Respiratory status: spontaneous breathing and respiratory function stable Cardiovascular status: blood pressure returned to baseline and stable Postop Assessment: no headache and no apparent nausea or vomiting Anesthetic complications: no Comments: Late entry   No notable events documented.   Last Vitals:  Vitals:   08/31/22 1214 08/31/22 1216  BP: (!) 183/85 (!) 170/82  Pulse: 86   Resp:    Temp: 37 C   SpO2: (!) 88%     Last Pain:  Vitals:   08/31/22 1214  TempSrc: Oral  PainSc: 0-No pain                 Windell Norfolk

## 2022-09-07 ENCOUNTER — Encounter (HOSPITAL_COMMUNITY): Payer: Self-pay | Admitting: Vascular Surgery

## 2022-09-16 ENCOUNTER — Other Ambulatory Visit: Payer: Self-pay

## 2022-09-16 ENCOUNTER — Encounter (HOSPITAL_COMMUNITY): Payer: Self-pay | Admitting: Emergency Medicine

## 2022-09-16 ENCOUNTER — Emergency Department (HOSPITAL_COMMUNITY): Payer: Medicare Other

## 2022-09-16 ENCOUNTER — Inpatient Hospital Stay (HOSPITAL_COMMUNITY)
Admission: EM | Admit: 2022-09-16 | Discharge: 2022-09-18 | DRG: 640 | Disposition: A | Discharge status: Home / Self Care | Payer: Medicare Other | Attending: Family Medicine | Admitting: Family Medicine

## 2022-09-16 DIAGNOSIS — I132 Hypertensive heart and chronic kidney disease with heart failure and with stage 5 chronic kidney disease, or end stage renal disease: Secondary | ICD-10-CM | POA: Diagnosis present

## 2022-09-16 DIAGNOSIS — G479 Sleep disorder, unspecified: Secondary | ICD-10-CM | POA: Diagnosis present

## 2022-09-16 DIAGNOSIS — D72829 Elevated white blood cell count, unspecified: Secondary | ICD-10-CM | POA: Diagnosis present

## 2022-09-16 DIAGNOSIS — I080 Rheumatic disorders of both mitral and aortic valves: Secondary | ICD-10-CM | POA: Diagnosis present

## 2022-09-16 DIAGNOSIS — J9601 Acute respiratory failure with hypoxia: Secondary | ICD-10-CM | POA: Diagnosis present

## 2022-09-16 DIAGNOSIS — D631 Anemia in chronic kidney disease: Secondary | ICD-10-CM | POA: Diagnosis present

## 2022-09-16 DIAGNOSIS — Z833 Family history of diabetes mellitus: Secondary | ICD-10-CM

## 2022-09-16 DIAGNOSIS — I160 Hypertensive urgency: Secondary | ICD-10-CM | POA: Diagnosis present

## 2022-09-16 DIAGNOSIS — Z79899 Other long term (current) drug therapy: Secondary | ICD-10-CM | POA: Diagnosis not present

## 2022-09-16 DIAGNOSIS — Z87891 Personal history of nicotine dependence: Secondary | ICD-10-CM

## 2022-09-16 DIAGNOSIS — R042 Hemoptysis: Secondary | ICD-10-CM | POA: Diagnosis present

## 2022-09-16 DIAGNOSIS — E1149 Type 2 diabetes mellitus with other diabetic neurological complication: Secondary | ICD-10-CM | POA: Diagnosis present

## 2022-09-16 DIAGNOSIS — Z992 Dependence on renal dialysis: Secondary | ICD-10-CM | POA: Diagnosis not present

## 2022-09-16 DIAGNOSIS — J9600 Acute respiratory failure, unspecified whether with hypoxia or hypercapnia: Secondary | ICD-10-CM | POA: Insufficient documentation

## 2022-09-16 DIAGNOSIS — L97519 Non-pressure chronic ulcer of other part of right foot with unspecified severity: Secondary | ICD-10-CM | POA: Diagnosis present

## 2022-09-16 DIAGNOSIS — N186 End stage renal disease: Secondary | ICD-10-CM | POA: Diagnosis present

## 2022-09-16 DIAGNOSIS — E1122 Type 2 diabetes mellitus with diabetic chronic kidney disease: Secondary | ICD-10-CM | POA: Diagnosis present

## 2022-09-16 DIAGNOSIS — Z8673 Personal history of transient ischemic attack (TIA), and cerebral infarction without residual deficits: Secondary | ICD-10-CM

## 2022-09-16 DIAGNOSIS — I5023 Acute on chronic systolic (congestive) heart failure: Secondary | ICD-10-CM | POA: Diagnosis present

## 2022-09-16 DIAGNOSIS — I5021 Acute systolic (congestive) heart failure: Secondary | ICD-10-CM | POA: Diagnosis present

## 2022-09-16 DIAGNOSIS — E11621 Type 2 diabetes mellitus with foot ulcer: Secondary | ICD-10-CM | POA: Diagnosis present

## 2022-09-16 DIAGNOSIS — Z8249 Family history of ischemic heart disease and other diseases of the circulatory system: Secondary | ICD-10-CM

## 2022-09-16 DIAGNOSIS — E8770 Fluid overload, unspecified: Secondary | ICD-10-CM | POA: Diagnosis present

## 2022-09-16 DIAGNOSIS — J810 Acute pulmonary edema: Principal | ICD-10-CM

## 2022-09-16 DIAGNOSIS — M109 Gout, unspecified: Secondary | ICD-10-CM | POA: Diagnosis present

## 2022-09-16 DIAGNOSIS — R053 Chronic cough: Secondary | ICD-10-CM | POA: Diagnosis present

## 2022-09-16 DIAGNOSIS — D638 Anemia in other chronic diseases classified elsewhere: Secondary | ICD-10-CM | POA: Diagnosis present

## 2022-09-16 LAB — RENAL FUNCTION PANEL
Albumin: 3.1 g/dL — ABNORMAL LOW (ref 3.5–5.0)
Anion gap: 14 (ref 5–15)
BUN: 38 mg/dL — ABNORMAL HIGH (ref 8–23)
CO2: 21 mmol/L — ABNORMAL LOW (ref 22–32)
Calcium: 7.7 mg/dL — ABNORMAL LOW (ref 8.9–10.3)
Chloride: 102 mmol/L (ref 98–111)
Creatinine, Ser: 6.53 mg/dL — ABNORMAL HIGH (ref 0.61–1.24)
GFR, Estimated: 9 mL/min — ABNORMAL LOW (ref >60)
Glucose, Bld: 217 mg/dL — ABNORMAL HIGH (ref 70–99)
Phosphorus: 4.6 mg/dL (ref 2.5–4.6)
Potassium: 3.8 mmol/L (ref 3.5–5.1)
Sodium: 137 mmol/L (ref 135–145)

## 2022-09-16 LAB — I-STAT CHEM 8, ED
BUN: 40 mg/dL — ABNORMAL HIGH (ref 8–23)
Calcium, Ion: 0.97 mmol/L — ABNORMAL LOW (ref 1.15–1.40)
Chloride: 105 mmol/L (ref 98–111)
Creatinine, Ser: 7.6 mg/dL — ABNORMAL HIGH (ref 0.61–1.24)
Glucose, Bld: 178 mg/dL — ABNORMAL HIGH (ref 70–99)
HCT: 29 % — ABNORMAL LOW (ref 39.0–52.0)
Hemoglobin: 9.9 g/dL — ABNORMAL LOW (ref 13.0–17.0)
Potassium: 4.5 mmol/L (ref 3.5–5.1)
Sodium: 140 mmol/L (ref 135–145)
TCO2: 24 mmol/L (ref 22–32)

## 2022-09-16 LAB — CBC
HCT: 25.7 % — ABNORMAL LOW (ref 39.0–52.0)
Hemoglobin: 8 g/dL — ABNORMAL LOW (ref 13.0–17.0)
MCH: 31.3 pg (ref 26.0–34.0)
MCHC: 31.1 g/dL (ref 30.0–36.0)
MCV: 100.4 fL — ABNORMAL HIGH (ref 80.0–100.0)
Platelets: 255 10*3/uL (ref 150–400)
RBC: 2.56 MIL/uL — ABNORMAL LOW (ref 4.22–5.81)
RDW: 15.8 % — ABNORMAL HIGH (ref 11.5–15.5)
WBC: 20.1 10*3/uL — ABNORMAL HIGH (ref 4.0–10.5)
nRBC: 0 % (ref 0.0–0.2)

## 2022-09-16 LAB — MAGNESIUM: Magnesium: 2.2 mg/dL (ref 1.7–2.4)

## 2022-09-16 LAB — BASIC METABOLIC PANEL
Anion gap: 12 (ref 5–15)
BUN: 39 mg/dL — ABNORMAL HIGH (ref 8–23)
CO2: 21 mmol/L — ABNORMAL LOW (ref 22–32)
Calcium: 7.3 mg/dL — ABNORMAL LOW (ref 8.9–10.3)
Chloride: 104 mmol/L (ref 98–111)
Creatinine, Ser: 6.21 mg/dL — ABNORMAL HIGH (ref 0.61–1.24)
GFR, Estimated: 9 mL/min — ABNORMAL LOW (ref >60)
Glucose, Bld: 210 mg/dL — ABNORMAL HIGH (ref 70–99)
Potassium: 3.7 mmol/L (ref 3.5–5.1)
Sodium: 137 mmol/L (ref 135–145)

## 2022-09-16 LAB — PHOSPHORUS: Phosphorus: 4.7 mg/dL — ABNORMAL HIGH (ref 2.5–4.6)

## 2022-09-16 LAB — PROTIME-INR
INR: 1.1 (ref 0.8–1.2)
Prothrombin Time: 13.9 seconds (ref 11.4–15.2)

## 2022-09-16 LAB — HEPATITIS B SURFACE ANTIGEN: Hepatitis B Surface Ag: NONREACTIVE

## 2022-09-16 MED ORDER — ACETAMINOPHEN 325 MG PO TABS: 650.0000 mg | ORAL_TABLET | Freq: Four times a day (QID) | ORAL | Status: DC | PRN

## 2022-09-16 MED ORDER — SACUBITRIL-VALSARTAN 97-103 MG PO TABS
1.0000 | ORAL_TABLET | Freq: Two times a day (BID) | ORAL | Status: DC
  Administered 2022-09-16 – 2022-09-18 (×4): 1 via ORAL
  Filled 2022-09-16 (×9): qty 1

## 2022-09-16 MED ORDER — ALTEPLASE 2 MG IJ SOLR: 2.0000 mg | Freq: Once | INTRAMUSCULAR | Status: DC | PRN

## 2022-09-16 MED ORDER — TRAZODONE HCL 50 MG PO TABS
25.0000 mg | ORAL_TABLET | Freq: Every evening | ORAL | Status: DC | PRN
  Administered 2022-09-16 – 2022-09-17 (×2): 25 mg via ORAL
  Filled 2022-09-16 (×2): qty 1

## 2022-09-16 MED ORDER — SODIUM CHLORIDE 0.9% FLUSH: 3.0000 mL | INTRAVENOUS | Status: DC | PRN

## 2022-09-16 MED ORDER — IPRATROPIUM BROMIDE 0.02 % IN SOLN: 0.5000 mg | Freq: Four times a day (QID) | RESPIRATORY_TRACT | Status: DC | PRN

## 2022-09-16 MED ORDER — PENTAFLUOROPROP-TETRAFLUOROETH EX AERO: 1.0000 | INHALATION_SPRAY | CUTANEOUS | Status: DC | PRN

## 2022-09-16 MED ORDER — LIDOCAINE-PRILOCAINE 2.5-2.5 % EX CREA: 1.0000 | TOPICAL_CREAM | CUTANEOUS | Status: DC | PRN

## 2022-09-16 MED ORDER — LABETALOL HCL 200 MG PO TABS
200.0000 mg | ORAL_TABLET | Freq: Two times a day (BID) | ORAL | Status: DC
  Administered 2022-09-16 – 2022-09-18 (×5): 200 mg via ORAL
  Filled 2022-09-16 (×5): qty 1

## 2022-09-16 MED ORDER — SENNOSIDES-DOCUSATE SODIUM 8.6-50 MG PO TABS: 1.0000 | ORAL_TABLET | Freq: Every evening | ORAL | Status: DC | PRN

## 2022-09-16 MED ORDER — ANTICOAGULANT SODIUM CITRATE 4% (200MG/5ML) IV SOLN: 5.0000 mL | Status: DC | PRN

## 2022-09-16 MED ORDER — OXYCODONE HCL 5 MG PO TABS: 5.0000 mg | ORAL_TABLET | ORAL | Status: DC | PRN

## 2022-09-16 MED ORDER — ONDANSETRON HCL 4 MG/2ML IJ SOLN: 4.0000 mg | Freq: Four times a day (QID) | INTRAMUSCULAR | Status: DC | PRN

## 2022-09-16 MED ORDER — HEPARIN SODIUM (PORCINE) 1000 UNIT/ML DIALYSIS
1000.0000 [IU] | INTRAMUSCULAR | Status: DC | PRN
  Administered 2022-09-17: 3800 [IU]
  Administered 2022-09-18: 1000 [IU]

## 2022-09-16 MED ORDER — FUROSEMIDE 40 MG PO TABS
80.0000 mg | ORAL_TABLET | Freq: Every day | ORAL | Status: DC
  Administered 2022-09-16 – 2022-09-18 (×3): 80 mg via ORAL
  Filled 2022-09-16 (×3): qty 2

## 2022-09-16 MED ORDER — ONDANSETRON HCL 4 MG PO TABS: 4.0000 mg | ORAL_TABLET | Freq: Four times a day (QID) | ORAL | Status: DC | PRN

## 2022-09-16 MED ORDER — FERROUS SULFATE 325 (65 FE) MG PO TABS
325.0000 mg | ORAL_TABLET | Freq: Two times a day (BID) | ORAL | Status: DC
  Administered 2022-09-16 – 2022-09-18 (×4): 325 mg via ORAL
  Filled 2022-09-16 (×4): qty 1

## 2022-09-16 MED ORDER — HEPARIN SODIUM (PORCINE) 5000 UNIT/ML IJ SOLN: 5000.0000 [IU] | Freq: Three times a day (TID) | INTRAMUSCULAR | Status: DC

## 2022-09-16 MED ORDER — ISOSORBIDE MONONITRATE ER 30 MG PO TB24
30.0000 mg | ORAL_TABLET | Freq: Every day | ORAL | Status: DC
  Administered 2022-09-16 – 2022-09-18 (×3): 30 mg via ORAL
  Filled 2022-09-16 (×3): qty 1

## 2022-09-16 MED ORDER — SODIUM CHLORIDE 0.9% FLUSH
3.0000 mL | Freq: Two times a day (BID) | INTRAVENOUS | Status: DC
  Administered 2022-09-16 – 2022-09-18 (×4): 3 mL via INTRAVENOUS

## 2022-09-16 MED ORDER — HYDRALAZINE HCL 25 MG PO TABS
75.0000 mg | ORAL_TABLET | Freq: Three times a day (TID) | ORAL | Status: DC
  Administered 2022-09-16 – 2022-09-18 (×6): 75 mg via ORAL
  Filled 2022-09-16 (×6): qty 3

## 2022-09-16 MED ORDER — FLEET ENEMA 7-19 GM/118ML RE ENEM: 1.0000 | ENEMA | Freq: Once | RECTAL | Status: DC | PRN

## 2022-09-16 MED ORDER — CALCITRIOL 0.25 MCG PO CAPS
0.5000 ug | ORAL_CAPSULE | Freq: Every day | ORAL | Status: DC
  Administered 2022-09-16 – 2022-09-18 (×3): 0.5 ug via ORAL
  Filled 2022-09-16 (×3): qty 2

## 2022-09-16 MED ORDER — AMLODIPINE BESYLATE 5 MG PO TABS
10.0000 mg | ORAL_TABLET | Freq: Every day | ORAL | Status: DC
  Administered 2022-09-17 – 2022-09-18 (×2): 10 mg via ORAL
  Filled 2022-09-16 (×2): qty 2

## 2022-09-16 MED ORDER — LIDOCAINE HCL (PF) 1 % IJ SOLN: 5.0000 mL | INTRAMUSCULAR | Status: DC | PRN

## 2022-09-16 MED ORDER — HYDRALAZINE HCL 20 MG/ML IJ SOLN: 10.0000 mg | INTRAMUSCULAR | Status: DC | PRN

## 2022-09-16 MED ORDER — CHLORHEXIDINE GLUCONATE CLOTH 2 % EX PADS
6.0000 | MEDICATED_PAD | Freq: Every day | CUTANEOUS | Status: DC
  Administered 2022-09-16 – 2022-09-18 (×2): 6 via TOPICAL

## 2022-09-16 MED ORDER — BISACODYL 5 MG PO TBEC: 5.0000 mg | DELAYED_RELEASE_TABLET | Freq: Every day | ORAL | Status: DC | PRN

## 2022-09-16 MED ORDER — HYDROMORPHONE HCL 1 MG/ML IJ SOLN: 0.5000 mg | INTRAMUSCULAR | Status: DC | PRN

## 2022-09-16 MED ORDER — LEVALBUTEROL HCL 0.63 MG/3ML IN NEBU: 0.6300 mg | INHALATION_SOLUTION | Freq: Four times a day (QID) | RESPIRATORY_TRACT | Status: DC | PRN

## 2022-09-16 MED ORDER — HEPARIN SODIUM (PORCINE) 5000 UNIT/ML IJ SOLN
5000.0000 [IU] | Freq: Three times a day (TID) | INTRAMUSCULAR | Status: DC
  Administered 2022-09-17: 5000 [IU] via SUBCUTANEOUS
  Filled 2022-09-16: qty 1

## 2022-09-16 MED ORDER — ACETAMINOPHEN 650 MG RE SUPP: 650.0000 mg | Freq: Four times a day (QID) | RECTAL | Status: DC | PRN

## 2022-09-16 NOTE — Assessment & Plan Note (Addendum)
-   On arrival to ED 200/107>>>Blood pressure (!) 190/94,  >>> 163/74>>> 126/62  -Home medication resumed: Entresto, Norvasc, hydralazine, Imdur, -Changed Coreg to labetalol -better BP control

## 2022-09-16 NOTE — ED Notes (Signed)
Patient was taken to [address] hall for dialysis with monitor and Bipap

## 2022-09-16 NOTE — Assessment & Plan Note (Addendum)
-    Lab Results  Component Value Date   CREATININE 6.60 (H) 09/18/2022   CREATININE 5.07 (H) 09/17/2022   CREATININE 7.60 (H) 09/16/2022   -EDP has discussed with nephrologist Dr. Signe Colt -Normal hemodialysis days TTS -Patient hemodialysis 09/16/2022 -Dialysis 09/17/2022

## 2022-09-16 NOTE — Assessment & Plan Note (Addendum)
-  Reviewing med list currently diabetic medication Last A1c 2 months ago 5.9, 2 years ago 6.4,

## 2022-09-16 NOTE — Assessment & Plan Note (Addendum)
-   Patient restarted hemodialysis anticipating more fluid off -EDP discussed with Dr. Signe Colt nephrologist patient may need further hemodialysis more aggressive fluid extraction, and optimizing blood pressure control Filed Weights   09/17/22 0500 09/17/22 1845 09/18/22 0359  Weight: 76.7 kg 77.1 kg 74.6 kg   -Continue hemodialysis, 09/16/2022, 09/17/2022

## 2022-09-16 NOTE — Progress Notes (Addendum)
Received patient in bed to unit.  Alert and oriented. With Bipap on. Informed consent signed and in chart.   TX duration:4 h. Pt unable to UF off 4 L as ordered. D/t leg bad cramping.  Patient tolerated well.  Transported back to the ICU Alert, without acute distress.  Hand-off given to patient's nurse.   Access used: Catheter Access issues: BFR 350. Lines reversed.  Total UF removed: 3 L Medication(s) given: none. Post HD VS: 189/91 P 96. R 18. O2 sat 96 % in nasal cannular 3 L O2 using.   Post HD weight:75.5 Kg   [facility]

## 2022-09-16 NOTE — ED Provider Notes (Addendum)
[place] EMERGENCY DEPARTMENT AT [facility] Provider Note   CSN: [id number] Arrival date & time: 09/16/22  0844     History  Chief Complaint  Patient presents with   Shortness of Breath    Matthew Robles is a 67 y.o. male.  HPI     67 year old male comes in with chief complaint of acute shortness of breath.  Patient has history of uncontrolled hypertension, diabetes, ESRD on hemodialysis, CHF.  Patient has been up-to-date with his hemodialysis.  He started noticing some shortness of breath yesterday and was using his as needed oxygen.  This morning at the dialysis center he was found to be acutely short of breath, his O2 sats were in the 70s and they called EMS.  Per EMS, patient has rales diffusely.  Patient denies any recent illnesses.  He also states that his blood pressure is always elevated, and that he normally takes his medications after the dialysis session.  Home Medications Prior to Admission medications   Medication Sig Start Date End Date Taking? Authorizing Provider  amLODipine (NORVASC) 10 MG tablet Take 1 tablet (10 mg total) by mouth daily. 07/05/22  Yes Tat, Onalee Hua, MD  calcitRIOL (ROCALTROL) 0.5 MCG capsule Take 1 capsule (0.5 mcg total) by mouth daily. 07/05/22  Yes Tat, Onalee Hua, MD  carvedilol (COREG) 25 MG tablet Take 2 tablets (50 mg total) by mouth 2 (two) times daily with a meal. 07/05/22  Yes Tat, David, MD  FEROSUL 325 (65 Fe) MG tablet Take 325 mg by mouth 2 (two) times daily. 08/18/22  Yes [provider]  furosemide (LASIX) 80 MG tablet Take 1 tablet (80 mg total) by mouth daily. 07/05/22  Yes Tat, Onalee Hua, MD  hydrALAZINE (APRESOLINE) 25 MG tablet Take 3 tablets (75 mg total) by mouth every 8 (eight) hours. 07/05/22  Yes Tat, Onalee Hua, MD  isosorbide mononitrate (IMDUR) 30 MG 24 hr tablet Take 1 tablet (30 mg total) by mouth daily. 07/05/22  Yes Tat, Onalee Hua, MD  sacubitril-valsartan (ENTRESTO) 97-103 MG Take 1 tablet by mouth 2 (two) times  daily. 07/05/22  Yes Tat, Onalee Hua, MD  oxyCODONE-acetaminophen (PERCOCET) 5-325 MG tablet Take 1 tablet by mouth every 6 (six) hours as needed for severe pain. Patient not taking: Reported on 09/16/2022 08/31/22   EarlyKristen Loader, MD      Allergies    Patient has no known allergies.    Review of Systems   Review of Systems  Physical Exam Updated Vital Signs BP (!) 159/89   Pulse 88   Temp 97.7 F (36.5 C) (Axillary)   Resp (!) 29   Ht 6' (1.829 m)   Wt 78.5 kg   SpO2 96%   BMI 23.46 kg/m  Physical Exam Vitals and nursing note reviewed.  Constitutional:      General: He is in acute distress.     Appearance: He is well-developed.  HENT:     Head: Atraumatic.  Cardiovascular:     Rate and Rhythm: Normal rate.  Pulmonary:     Effort: Tachypnea, accessory muscle usage and respiratory distress present.     Breath sounds: Examination of the right-middle field reveals rales. Examination of the left-middle field reveals rales. Examination of the right-lower field reveals rales. Examination of the left-lower field reveals rales. Rales present.  Musculoskeletal:     Cervical back: Neck supple.     Right lower leg: No edema.     Left lower leg: No edema.  Skin:    General:  Skin is warm.  Neurological:     Mental Status: He is oriented to person, place, and time.     ED Results / Procedures / Treatments   Labs (all labs ordered are listed, but only abnormal results are displayed) Labs Reviewed  BASIC METABOLIC PANEL - Abnormal; Notable for the following components:      Result Value   CO2 21 (*)    Glucose, Bld 210 (*)    BUN 39 (*)    Creatinine, Ser 6.21 (*)    Calcium 7.3 (*)    GFR, Estimated 9 (*)    All other components within normal limits  CBC - Abnormal; Notable for the following components:   WBC 20.1 (*)    RBC 2.56 (*)    Hemoglobin 8.0 (*)    HCT 25.7 (*)    MCV 100.4 (*)    RDW 15.8 (*)    All other components within normal limits  HEPATITIS B SURFACE  ANTIGEN  HEPATITIS B SURFACE ANTIBODY, QUANTITATIVE  RENAL FUNCTION PANEL  I-STAT CHEM 8, ED    EKG EKG Interpretation  Date/Time:  Thursday September 16 2022 08:44:23 EDT Ventricular Rate:  126 PR Interval:  147 QRS Duration: 102 QT Interval:  307 QTC Calculation: 445 R Axis:   64 Text Interpretation: Sinus tachycardia Anteroseptal infarct, old Abnormal T, consider ischemia, lateral leads Nonspecific ST and T wave abnormality No significant change since last tracing Confirmed by Derwood Kaplan ([id number]) on 09/16/2022 9:52:02 AM  Radiology DG Chest Port 1 View  Result Date: 09/16/2022 CLINICAL DATA:  Shortness of breath, CHF, dialysis EXAM: PORTABLE CHEST 1 VIEW COMPARISON:  07/04/2022 FINDINGS: Cardiomegaly. Large-bore right chest multi lumen vascular catheter. Diffuse bilateral interstitial pulmonary opacity and probable small layering pleural effusions. Osseous structures unremarkable. IMPRESSION: 1. Cardiomegaly with diffuse bilateral interstitial pulmonary opacity and probable small layering pleural effusions, consistent with edema. No focal airspace opacity. 2. Large-bore right chest multi lumen vascular catheter. Electronically Signed   By: Jearld Lesch M.D.   On: 09/16/2022 09:03    Procedures .Critical Care  Performed by: Derwood Kaplan, MD Authorized by: Derwood Kaplan, MD   Critical care provider statement:    Critical care time (minutes):  49   Critical care time was exclusive of:  Separately billable procedures and treating other patients   Critical care was necessary to treat or prevent imminent or life-threatening deterioration of the following conditions:  Respiratory failure, circulatory failure and renal failure   Critical care was time spent personally by me on the following activities:  Development of treatment plan with patient or surrogate, discussions with consultants, evaluation of patient's response to treatment, examination of patient, ordering and review of  laboratory studies, ordering and review of radiographic studies, ordering and performing treatments and interventions, pulse oximetry, re-evaluation of patient's condition and review of old charts     Medications Ordered in ED Medications  Chlorhexidine Gluconate Cloth 2 % PADS 6 each (has no administration in time range)  pentafluoroprop-tetrafluoroeth (GEBAUERS) aerosol 1 Application (has no administration in time range)  lidocaine (PF) (XYLOCAINE) 1 % injection 5 mL (has no administration in time range)  lidocaine-prilocaine (EMLA) cream 1 Application (has no administration in time range)  heparin injection 1,000 Units (has no administration in time range)  anticoagulant sodium citrate solution 5 mL (has no administration in time range)  alteplase (CATHFLO ACTIVASE) injection 2 mg (has no administration in time range)    ED Course/ Medical Decision Making/ A&P Clinical Course  as of 09/16/22 1047  Thu Sep 16, 2022  1044 Hemoglobin(!): 8.0 Patient's hemoglobin has been less than 9 for the last several visits.  Likely anemia of chronic disease. [AN]  1044 Potassium: 3.7 Potassium is within normal limits. [AN]  1044 WBC(!): 20.1 Patient's white count is elevated, likely acute marker for stress and not infection. [AN]    Clinical Course User Index [AN] Derwood Kaplan, MD                             Medical Decision Making Amount and/or Complexity of Data Reviewed Labs: ordered. Decision-making details documented in ED Course. Radiology: ordered.  Risk Decision regarding hospitalization.   This patient presents to the ED with chief complaint(s) of acute shortness of breath, hypoxia with pertinent past medical history of ESRD on hemodialysis, CHF and uncontrolled hypertension.The complaint involves an extensive differential diagnosis and also carries with it a high risk of complications and morbidity.    The differential diagnosis includes : Hypertensive emergency, flash  pulmonary edema, pleural effusion, pneumonia, hyperkalemia, acute coronary syndrome.  The initial plan is to place patient on BiPAP to temporize him. On exam he has rales diffusely.  I suspect this is flash pulmonary edema.  Patient is hypertensive, but some of that could be because of increased sympathomimetic activity given the respiratory distress.  Patient indicates that he has chronically elevated blood pressure, often in the 200 range when he goes for dialysis -so blood pressure might not be the root cause of this issue.   Additional history obtained: Additional history obtained from EMS  Records reviewed previous admission documents  Independent labs interpretation:  The following labs were independently interpreted: Potassium is reassuring.  Hemoglobin is 8, at baseline for patient.  Independent visualization and interpretation of imaging: - I independently visualized the following imaging with scope of interpretation limited to determining acute life threatening conditions related to emergency care: X-ray of the chest, which revealed concerning pulmonary edema  Treatment and Reassessment: Patient reassessed.  His O2 sats has been maintained over 94% now.  Although he is tachypneic, his breathing is improved.  Blood pressure also has stabilized in the 1 60-1 80 range.  Patient will need emergent hemodialysis.  Consultation: - Consulted or discussed management/test interpretation with external professional: Discussed case with nephrology on-call.  They will dialyze the patient today.  I personally think that if patient's dialysis is completed, then he might be able to be discharged.  Final Clinical Impression(s) / ED Diagnoses Final diagnoses:  Flash pulmonary edema  ESRD (end stage renal disease) on dialysis    Rx / DC Orders ED Discharge Orders     None         Derwood Kaplan, MD 09/17/22 1002    Derwood Kaplan, MD 09/17/22 1002

## 2022-09-16 NOTE — Consult Note (Signed)
Reason for Consult: To manage dialysis and dialysis related needs Referring Physician: Dr Andi Devon is an 67 y.o. male.  HPI: Pt is a 32M with a pMH sig for ESRD on HD TTS at [facility], DM II, CHF who is now seen in consultation at the request of Dr. Rhunette Croft for management of ESRD and provision of HD.    Pt had SOB starting 2 days ago.  Last HD Tuesday.  Reportedly got to EDW and did well.  Started having progressive SOB, presented to HD today and was in respiratory distress.  EMS called, placed on CPAP, came here to [facility] ED.  Initially sats in the 60s, now placed on BiPaP and sats in the 90s.  Appears comfortable now, can speak in short sentences.  Wife at [facility].    In this setting we are asked to see.    Pt says his BP is usually in the 200s systolic pre-HD and 175 post HD.  Doesn't think he's lost any weight.  Had recent 2nd stage BVT 08/31/22 by Dr early, using [facility].    Dialyzes at [facility] TTS   Past Medical History:  Diagnosis Date   CHF (congestive heart failure)    Chronic kidney disease    Diabetes mellitus without complication    Hypertension    Stroke    2017    Past Surgical History:  Procedure Laterality Date   AV FISTULA PLACEMENT Left 07/13/2022   Procedure: LEFT ARM ARTERIOVENOUS (AV) FISTULA CREATION;  Surgeon: [name], MD;  Location: [facility] ORS;  Service: Vascular;  Laterality: Left;   BASCILIC VEIN TRANSPOSITION Left 08/31/2022   Procedure: LEFT ARM SECOND STAGE BASILIC VEIN TRANSPOSITION;  Surgeon: [name], MD;  Location: [facility] ORS;  Service: Vascular;  Laterality: Left;   INSERTION OF DIALYSIS CATHETER Right 06/25/2022   Procedure: INSERTION OF DIALYSIS CATHETER;  Surgeon: Lucretia Roers, MD;  Location: [facility] ORS;  Service: General;  Laterality: Right;   WISDOM TOOTH EXTRACTION      Family History  Problem Relation Age of Onset   Diabetes Mother    Hypertension Mother    Diabetes Maternal Grandfather     Social History:   reports that he quit smoking about 5 years ago. His smoking use included cigarettes. He smoked an average of 1 pack per day. He has never used smokeless tobacco. He reports that he does not currently use alcohol after a past usage of about 4.0 standard drinks of alcohol per week. He reports that he does not currently use drugs after having used the following drugs: Marijuana.  Allergies: No Known Allergies  Medications: Scheduled:  Chlorhexidine Gluconate Cloth  6 each Topical Q0600     No results found for this or any previous visit (from the past 48 hour(s)).  DG Chest Port 1 View  Result Date: 09/16/2022 CLINICAL DATA:  Shortness of breath, CHF, dialysis EXAM: PORTABLE CHEST 1 VIEW COMPARISON:  07/04/2022 FINDINGS: Cardiomegaly. Large-bore right chest multi lumen vascular catheter. Diffuse bilateral interstitial pulmonary opacity and probable small layering pleural effusions. Osseous structures unremarkable. IMPRESSION: 1. Cardiomegaly with diffuse bilateral interstitial pulmonary opacity and probable small layering pleural effusions, consistent with edema. No focal airspace opacity. 2. Large-bore right chest multi lumen vascular catheter. Electronically Signed   By: Jearld Lesch M.D.   On: 09/16/2022 09:03    ROS: all other systems reviewed and are  Blood pressure (!) 195/143, pulse (!) 121, temperature 97.7 F (36.5 C), temperature source  Axillary, resp. rate (!) 28, height 6' (1.829 m), weight 78.5 kg, SpO2 (!) 76 %. GEN appears slightly uncomfortable, sitting straight up in bed HEENT EOMI PERRL NECK+ JVD upright  PULM diffuse bilateral coarse inspiratory and expiratory crackles CV tachycardic ABD soft EXT 2+ LE edema NEURO AAO x 3 SKIN R ulcer, 1-2 cm in diameter, dried, on ball of R foot.  Does not appear infected ACCESS R TDC, L AVF + T/B, well-healing incision  Assessment/Plan: 1 Acute hypoxic RF: CXR with diffuse pulm edema.  Needs urgent dialysis.  Orders written, RN  informed.  Likely will need HD tomorrow too.  Max UF.  Will also need new EDW 2 ESRD: TTS [facility], hasn't missed 3 Hypertension: will improve withUF, needs new Edw likely 4. Anemia of ESRD: labs pending 5. Metabolic Bone Disease: labs pending 6.  DM II: per primary 7.  R foot diabetic ulcer: pt says he's soaking in Epsom salts and trying to pull callus off himself- he needs a podiatrist as an OP and probably just a wound care c/s here 8. Dispo: in ED, likely to be admitted  Bufford Buttner 09/16/2022, 9:47 AM

## 2022-09-16 NOTE — H&P (Signed)
History and Physical   Patient: Matthew Robles                            PCP: [facility]                    DOB: 1955/07/01            DOA: 09/16/2022 ZOX:[id number]             DOS: 09/16/2022, 12:42 PM  [facility]  Patient coming from:   HOME  I have personally reviewed patient's medical records, in electronic medical records, including:  [place] link, and care everywhere.    Chief Complaint:   Chief Complaint  Patient presents with   Shortness of Breath    History of present illness:    Matthew Robles is a 67 year old male with longstanding history of hypertension, ESRD on HD TTS (primary nephrologist Dr. Molli Barrows), DM II  with neuropathy, gout, sleep disorder, CVA, reduced EF CHF, chronic anemia..  Presented to the ED today with chief complaint of shortness of breath.  Patient is compliant with medication up-to-date with hemodialysis he started noticing some shortness of breath yesterday and increasing his oxygen demand.  This morning at hemodialysis center he was found to be worsening with shortness of breath and elevated blood pressure he desatted to 70s according to the report. Blood pressure on arrival was 200/107-with audible rails  ED course/evaluation: On arrival BP 200/107 Blood pressure (!) 190/94, pulse 80, temperature 98 F (36.7 C), weight 78.5 kg, SpO2 96 %.  BiPAP Patient was placed on BiPAP  Afebrile with WBC of 20.1, hemoglobin 8.0, 9.9, potassium 4.5, chloride 105, CO2 21, BUN 40, creatinine 7.60, calcium 7.3, glucose 210, 178  EDP discussed with nephrologist Dr. Signe Colt who initiated hemodialysis  Symptomatic likely due to volume overload, also requested admission for inpatient hemodialysis and BP management and acute respiratory failure due to volume overload.      Patient Denies having: Fever, Chills, Cough, SOB, Chest Pain, Abd pain, N/V/D, headache, dizziness, lightheadedness,  Dysuria,  Joint pain, rash, open wounds     Review of Systems: As per HPI, otherwise 10 point review of systems were negative.   ----------------------------------------------------------------------------------------------------------------------  No Known Allergies  Home MEDs:  Prior to Admission medications   Medication Sig Start Date End Date Taking? Authorizing Provider  amLODipine (NORVASC) 10 MG tablet Take 1 tablet (10 mg total) by mouth daily. 07/05/22  Yes Tat, Onalee Hua, MD  calcitRIOL (ROCALTROL) 0.5 MCG capsule Take 1 capsule (0.5 mcg total) by mouth daily. 07/05/22  Yes Tat, Onalee Hua, MD  carvedilol (COREG) 25 MG tablet Take 2 tablets (50 mg total) by mouth 2 (two) times daily with a meal. 07/05/22  Yes Tat, David, MD  FEROSUL 325 (65 Fe) MG tablet Take 325 mg by mouth 2 (two) times daily. 08/18/22  Yes [provider]  furosemide (LASIX) 80 MG tablet Take 1 tablet (80 mg total) by mouth daily. 07/05/22  Yes Tat, Onalee Hua, MD  hydrALAZINE (APRESOLINE) 25 MG tablet Take 3 tablets (75 mg total) by mouth every 8 (eight) hours. 07/05/22  Yes Tat, Onalee Hua, MD  isosorbide mononitrate (IMDUR) 30 MG 24 hr tablet Take 1 tablet (30 mg total) by mouth daily. 07/05/22  Yes Tat, Onalee Hua, MD  sacubitril-valsartan (ENTRESTO) 97-103 MG Take 1 tablet by mouth 2 (two) times daily. 07/05/22  Yes TatOnalee Hua, MD  oxyCODONE-acetaminophen [facility])  5-325 MG tablet Take 1 tablet by mouth every 6 (six) hours as needed for severe pain. Patient not taking: Reported on 09/16/2022 08/31/22   EarlyKristen Loader, MD    PRN MEDs: acetaminophen **OR** acetaminophen, alteplase, anticoagulant sodium citrate, bisacodyl, heparin, hydrALAZINE, HYDROmorphone (DILAUDID) injection, ipratropium, levalbuterol, lidocaine (PF), lidocaine-prilocaine, ondansetron **OR** ondansetron (ZOFRAN) IV, oxyCODONE, pentafluoroprop-tetrafluoroeth, senna-docusate, sodium chloride flush, traZODone  Past Medical History:  Diagnosis Date   CHF (congestive heart  failure)    Chronic kidney disease    Diabetes mellitus without complication    Hypertension    Stroke    2017    Past Surgical History:  Procedure Laterality Date   AV FISTULA PLACEMENT Left 07/13/2022   Procedure: LEFT ARM ARTERIOVENOUS (AV) FISTULA CREATION;  Surgeon: [name], MD;  Location: [facility];  Service: Vascular;  Laterality: Left;   BASCILIC VEIN TRANSPOSITION Left 08/31/2022   Procedure: LEFT ARM SECOND STAGE BASILIC VEIN TRANSPOSITION;  Surgeon: [name], MD;  Location: [facility];  Service: Vascular;  Laterality: Left;   INSERTION OF DIALYSIS CATHETER Right 06/25/2022   Procedure: INSERTION OF DIALYSIS CATHETER;  Surgeon: Lucretia Roers, MD;  Location: [facility];  Service: General;  Laterality: Right;   WISDOM TOOTH EXTRACTION       reports that he quit smoking about 5 years ago. His smoking use included cigarettes. He smoked an average of 1 pack per day. He has never used smokeless tobacco. He reports that he does not currently use alcohol after a past usage of about 4.0 standard drinks of alcohol per week. He reports that he does not currently use drugs after having used the following drugs: Marijuana.   Family History  Problem Relation Age of Onset   Diabetes Mother    Hypertension Mother    Diabetes Maternal Grandfather     Physical Exam:   Vitals:   09/16/22 1135 09/16/22 1145 09/16/22 1153 09/16/22 1200  BP: (!) 190/101 (!) 200/107  (!) 190/94  Pulse: 86 92  80  Resp: Temp:      TempSrc:      SpO2: 94% 94%  96%  Weight:   78.5 kg   Height:       Constitutional: NAD, calm, comfortable Eyes: PERRL, lids and conjunctivae normal ENMT: Mucous membranes are moist. Posterior pharynx clear of any exudate or lesions.Normal dentition.  Neck: normal, supple, no masses, no thyromegaly Respiratory: Diffuse Rales, crackles mid to lower lobes Positive breath sounds Cardiovascular : regular rate and rhythm, no murmurs / rubs / gallops. No extremity  edema. 2+ pedal pulses. No carotid bruits.  Abdomen: no tenderness, no masses palpated. No hepatosplenomegaly. Bowel sounds positive.  Musculoskeletal: no clubbing / cyanosis. No joint deformity upper and lower extremities. Good ROM, no contractures. Normal muscle tone.  Neurologic: CN II-XII grossly intact. Sensation intact, DTR normal. Strength 5/5 in all 4.  Psychiatric: Normal judgment and insight. Alert and oriented x 3. Normal mood.  Skin: no rashes, lesions, ulcers. No induration Decubitus/ulcers:  Wounds: per nursing documentation         Labs on admission:    I have personally reviewed following labs and imaging studies  CBC: Recent Labs  Lab 09/16/22 0928 09/16/22 1053  WBC 20.1*  --   HGB 8.0* 9.9*  HCT 25.7* 29.0*  MCV 100.4*  --   PLT 255  --    Basic Metabolic Panel: Recent Labs  Lab 09/16/22 0928 09/16/22 1053  NA 137  137 140  K 3.8  3.7 4.5  CL 102  104 105  CO2 21*  21*  --   GLUCOSE 217*  210* 178*  BUN 38*  39* 40*  CREATININE 6.53*  6.21* 7.60*  CALCIUM 7.7*  7.3*  --   PHOS 4.6  --    GFR: Estimated Creatinine Clearance: 10.2 mL/min (A) (by C-G formula based on SCr of 7.6 mg/dL (H)). Liver Function Tests: Recent Labs  Lab 09/16/22 0928  ALBUMIN 3.1*    Urine analysis:    Component Value Date/Time   COLORURINE STRAW (A) 12/25/2020 2057   APPEARANCEUR CLEAR 12/25/2020 2057   LABSPEC 1.006 12/25/2020 2057   PHURINE 8.0 12/25/2020 2057   GLUCOSEU 50 (A) 12/25/2020 2057   HGBUR SMALL (A) 12/25/2020 2057   BILIRUBINUR NEGATIVE 12/25/2020 2057   KETONESUR NEGATIVE 12/25/2020 2057   PROTEINUR 30 (A) 12/25/2020 2057   NITRITE NEGATIVE 12/25/2020 2057   LEUKOCYTESUR NEGATIVE 12/25/2020 2057    Last A1C:  Lab Results  Component Value Date   HGBA1C 5.9 (H) 06/24/2022     Radiologic Exams on Admission:   DG Chest Port 1 View  Result Date: 09/16/2022 CLINICAL DATA:  Shortness of breath, CHF, dialysis EXAM: PORTABLE CHEST  1 VIEW COMPARISON:  07/04/2022 FINDINGS: Cardiomegaly. Large-bore right chest multi lumen vascular catheter. Diffuse bilateral interstitial pulmonary opacity and probable small layering pleural effusions. Osseous structures unremarkable. IMPRESSION: 1. Cardiomegaly with diffuse bilateral interstitial pulmonary opacity and probable small layering pleural effusions, consistent with edema. No focal airspace opacity. 2. Large-bore right chest multi lumen vascular catheter. Electronically Signed   By: Jearld Lesch M.D.   On: 09/16/2022 09:03    EKG:   Independently reviewed.  Orders placed or performed during the hospital encounter of 09/16/22   ED EKG   ED EKG   EKG 12-Lead   EKG 12-Lead   EKG 12-Lead   ---------------------------------------------------------------------------------------------------------------------------------------    Assessment / Plan:   Principal Problem:   Volume overload Active Problems:   ESRD (end stage renal disease)   Acute HFrEF (heart failure with reduced ejection fraction)   Hypertensive urgency   DM (diabetes mellitus), type 2 with neurological complications   Acute respiratory failure with hypoxia   Assessment and Plan: * Volume overload - Patient restarted hemodialysis anticipating more fluid off -EDP discussed with Dr. Signe Colt nephrologist patient may need further hemodialysis more aggressive fluid extraction, and optimizing blood pressure control Filed Weights   09/16/22 0835 09/16/22 1153  Weight: 78.5 kg 78.5 kg      Acute HFrEF (heart failure with reduced ejection fraction) -HFrEF  - Reviewed last echocardiogram 06/24/2022, EEG GAF 45-50%, global hypokinesis LV, moderate concentric LV hypertrophy, grade 2 diastolic dysfunction, left atrial size severely dilated, right atrial size moderately dilated, left pleural effusion noted, mitral valve regurgitation, mildly calcified aortic valve, -Continuing diuretics including  hemodialysis -Monitoring daily weight -Continue home medication including Entresto, Imdur, beta-blocker switched to labetalol for better blood pressure control   ESRD (end stage renal disease) -  Lab Results  Component Value Date   CREATININE 7.60 (H) 09/16/2022   CREATININE 6.21 (H) 09/16/2022   CREATININE 6.53 (H) 09/16/2022   -EDP has discussed with nephrologist Dr. Signe Colt -Normal hemodialysis days TTS -Patient hemodialysis  Acute respiratory failure with hypoxia - Due to accelerated hypertension, volume overload -Restarting oral BP meds, with a as needed hydralazine available, -Treating volume overload with hemodialysis now -Monitoring closely -Initiated hemodialysis BiPAP satting 96%>>> dissipating weaning patient down accordingly  DM (diabetes mellitus), type 2 with neurological complications -Reviewing med list currently diabetic medication Last A1c 2 months ago 5.9, 2 years ago 6.4, -Will check A1c -Will check CBG q. ACHS, SSI coverage  Hypertensive urgency - On arrival to ED 200/107 Blood pressure (!) 190/94, pulse 80, temperature 98 F (36.7 C),weight 78.5 kg, SpO2 96 %.  On BiPAP -Home medication resumed: Entresto, Norvasc, hydralazine, Imdur, - Will change Coreg to labetalol -better BP control - along with as needed IV hydralazine, labetalol               Consults called: Nephrologist Dr. Signe Colt -------------------------------------------------------------------------------------------------------------------------------------------- DVT prophylaxis:  heparin injection 5,000 Units Start: 09/16/22 1400 TED hose Start: 09/16/22 1214 SCDs Start: 09/16/22 1214   Code Status:   Code Status: Full Code   Admission status: Patient will be admitted as Observation, with a greater than 2 midnight length of stay. Level of care: Stepdown   Family Communication:  none at bedside  (The above findings and plan of care has been discussed with patient in  detail, the patient expressed understanding and agreement of above plan)  --------------------------------------------------------------------------------------------------------------------------------------------------  Disposition Plan:  Anticipated 1-2 days Status is: Observation The patient remains OBS appropriate and will d/c before 2 midnights.     ----------------------------------------------------------------------------------------------------------------------------------------------------  Time spent:  52  Min.  Was spent seeing and evaluating the patient, reviewing all medical records, drawn plan of care.  SIGNED: Kendell Bane, MD, FHM. FAAFP. [facility], Pager  (Please use [email or web address] to page/ or secure chat through epic) If 7PM-7AM, please contact night-coverage [email or web address],  09/16/2022, 12:42 PM

## 2022-09-16 NOTE — Hospital Course (Signed)
Matthew Robles is a 67 year old male with longstanding history of hypertension, ESRD on HD TTS (primary nephrologist Dr. Molli Barrows), DM II  with neuropathy, gout, sleep disorder, CVA, reduced EF CHF, chronic anemia..  Presented to the ED today with chief complaint of shortness of breath.  Patient is compliant with medication up-to-date with hemodialysis he started noticing some shortness of breath yesterday and increasing his oxygen demand.  This morning at hemodialysis center he was found to be worsening with shortness of breath and elevated blood pressure he desatted to 70s according to the report. Blood pressure on arrival was 200/107-with audible rails  ED course/evaluation: On arrival BP 200/107 Blood pressure (!) 190/94, pulse 80, temperature 98 F (36.7 C), weight 78.5 kg, SpO2 96 %.  BiPAP Patient was placed on BiPAP  Afebrile with WBC of 20.1, hemoglobin 8.0, 9.9, potassium 4.5, chloride 105, CO2 21, BUN 40, creatinine 7.60, calcium 7.3, glucose 210, 178  EDP discussed with nephrologist Dr. Signe Colt who initiated hemodialysis  Symptomatic likely due to volume overload, also requested admission for inpatient hemodialysis and BP management and acute respiratory failure due to volume overload.

## 2022-09-16 NOTE — Assessment & Plan Note (Addendum)
-  Improved off BiPAP - Due to accelerated hypertension, volume overload -Restarting oral BP meds, with a as needed hydralazine available, -Treating volume overload with hemodialysis now -Monitoring closely -Initiated hemodialysis BiPAP satting 96%>>>  weaning patient down accordingly--- currently on 2 L of oxygen satting 98%

## 2022-09-16 NOTE — Assessment & Plan Note (Addendum)
-  HFrEF  - Reviewed last echocardiogram 06/24/2022, EEG GAF 45-50%, global hypokinesis LV, moderate concentric LV hypertrophy, grade 2 diastolic dysfunction, left atrial size severely dilated, right atrial size moderately dilated, left pleural effusion noted, mitral valve regurgitation, mildly calcified aortic valve, -Continuing diuretics including hemodialysis -Monitoring daily weight -Continue home medication including Entresto, Imdur, beta-blocker switched to labetalol for better blood pressure control  Filed Weights   09/17/22 0500 09/17/22 1845 09/18/22 0359  Weight: 76.7 kg 77.1 kg 74.6 kg    Intake/Output Summary (Last 24 hours) at 09/18/2022 0729 Last data filed at 09/17/2022 2223 Gross per 24 hour  Intake 315 ml  Output 3400 ml  Net -3085 ml

## 2022-09-16 NOTE — Progress Notes (Signed)
At 1100hr, RT transported patient to Dialysis on BIPAP, no complications noted. RT will continue to monitor.

## 2022-09-16 NOTE — ED Triage Notes (Signed)
SOB started x 2 days ago. H/s CHF, Dialysis. Cpap by EMS.

## 2022-09-17 ENCOUNTER — Inpatient Hospital Stay (HOSPITAL_COMMUNITY): Payer: Medicare Other

## 2022-09-17 DIAGNOSIS — J9601 Acute respiratory failure with hypoxia: Secondary | ICD-10-CM | POA: Diagnosis not present

## 2022-09-17 DIAGNOSIS — J810 Acute pulmonary edema: Secondary | ICD-10-CM

## 2022-09-17 DIAGNOSIS — E8770 Fluid overload, unspecified: Secondary | ICD-10-CM | POA: Diagnosis not present

## 2022-09-17 DIAGNOSIS — R042 Hemoptysis: Secondary | ICD-10-CM

## 2022-09-17 LAB — BASIC METABOLIC PANEL
Anion gap: 8 (ref 5–15)
BUN: 27 mg/dL — ABNORMAL HIGH (ref 8–23)
CO2: 28 mmol/L (ref 22–32)
Calcium: 7.5 mg/dL — ABNORMAL LOW (ref 8.9–10.3)
Chloride: 100 mmol/L (ref 98–111)
Creatinine, Ser: 5.07 mg/dL — ABNORMAL HIGH (ref 0.61–1.24)
GFR, Estimated: 12 mL/min — ABNORMAL LOW (ref >60)
Glucose, Bld: 99 mg/dL (ref 70–99)
Potassium: 3.7 mmol/L (ref 3.5–5.1)
Sodium: 136 mmol/L (ref 135–145)

## 2022-09-17 LAB — MRSA NEXT GEN BY PCR, NASAL: MRSA by PCR Next Gen: NOT DETECTED

## 2022-09-17 LAB — CBC
HCT: 21.3 % — ABNORMAL LOW (ref 39.0–52.0)
Hemoglobin: 6.7 g/dL — CL (ref 13.0–17.0)
MCH: 30.7 pg (ref 26.0–34.0)
MCHC: 31.5 g/dL (ref 30.0–36.0)
MCV: 97.7 fL (ref 80.0–100.0)
Platelets: 191 10*3/uL (ref 150–400)
RBC: 2.18 MIL/uL — ABNORMAL LOW (ref 4.22–5.81)
RDW: 15.2 % (ref 11.5–15.5)
WBC: 9.4 10*3/uL (ref 4.0–10.5)
nRBC: 0 % (ref 0.0–0.2)

## 2022-09-17 LAB — TYPE AND SCREEN

## 2022-09-17 LAB — IRON AND TIBC
Iron: 30 ug/dL — ABNORMAL LOW (ref 45–182)
Saturation Ratios: 18 % (ref 17.9–39.5)
TIBC: 171 ug/dL — ABNORMAL LOW (ref 250–450)
UIBC: 141 ug/dL

## 2022-09-17 LAB — PROTIME-INR
INR: 1.2 (ref 0.8–1.2)
Prothrombin Time: 15.4 seconds — ABNORMAL HIGH (ref 11.4–15.2)

## 2022-09-17 LAB — PREPARE RBC (CROSSMATCH)

## 2022-09-17 LAB — HEMOGLOBIN AND HEMATOCRIT, BLOOD
HCT: 21.8 % — ABNORMAL LOW (ref 39.0–52.0)
Hemoglobin: 6.9 g/dL — CL (ref 13.0–17.0)

## 2022-09-17 LAB — APTT: aPTT: 40 seconds — ABNORMAL HIGH (ref 24–36)

## 2022-09-17 LAB — HEPATITIS B SURFACE ANTIBODY, QUANTITATIVE: Hep B S AB Quant (Post): <3.5 m[IU]/mL — ABNORMAL LOW (ref >9.9)

## 2022-09-17 LAB — BPAM RBC: Blood Product Expiration Date: 202405232359

## 2022-09-17 MED ORDER — SODIUM CHLORIDE 0.9% IV SOLUTION: Freq: Once | INTRAVENOUS | Status: DC

## 2022-09-17 MED ORDER — DARBEPOETIN ALFA 100 MCG/0.5ML IJ SOSY
100.0000 ug | PREFILLED_SYRINGE | INTRAMUSCULAR | Status: DC
  Administered 2022-09-17: 100 ug via SUBCUTANEOUS
  Filled 2022-09-17: qty 0.5

## 2022-09-17 MED ORDER — DIPHENHYDRAMINE HCL 25 MG PO CAPS
25.0000 mg | ORAL_CAPSULE | Freq: Once | ORAL | Status: AC
  Administered 2022-09-17: 25 mg via ORAL
  Filled 2022-09-17: qty 1

## 2022-09-17 MED ORDER — ACETAMINOPHEN 325 MG PO TABS
650.0000 mg | ORAL_TABLET | Freq: Once | ORAL | Status: AC
  Administered 2022-09-17: 650 mg via ORAL
  Filled 2022-09-17: qty 2

## 2022-09-17 NOTE — Assessment & Plan Note (Signed)
Off BiPAP, likely due to volume overload, -Respiratory distress has improved weaning down to 2 L of oxygen, maintaining O2 sat greater 92%

## 2022-09-17 NOTE — Assessment & Plan Note (Signed)
-   Patient has persistent cough, with blood-tinged sputum -But Not complaining of overt continuous bleeding or vomiting blood -Obtaining high-resolution CT scan of the chest -Consulted PCCM Dr. Vassie Loll for further evaluation recommendations -Current cough meds

## 2022-09-17 NOTE — Progress Notes (Signed)
[facility] Progress Note   Assessment/ Plan:   Assessment/Plan: 1 Acute hypoxic RF: CXR with diffuse pulm edema.  Urgent dialysis yesterday, now extra rx today.  Will do 1 hr UF and 2 hrs dialysis (sequential rx) to maximize fluid removal and hopefully minimize cramping 2 ESRD: TTS [facility], hasn't missed 3 Hypertension: will improve withUF, needs new Edw likely 4. Anemia of ESRD: Hgb 8.0--> 9.7 (? Outlyer) --> 6.7-6.9 this AM.  Getting 1 u pRBCs, have ordered ESA too 5. Metabolic Bone Disease: labs pending 6.  DM II: per primary 7.  R foot diabetic ulcer: pt says he's soaking in Epsom salts and trying to pull callus off himself- he needs a podiatrist as an OP and probably just a wound care c/s here 8. Dispo: admitted  Subjective:    Hgb down to 6.7-6.9 this AM.  Getting 1 u pRBCs.  Looks much better and is without complaint this AM.     Objective:   BP (!) 163/74   Pulse 91   Temp 97.7 F (36.5 C) (Oral)   Resp 18   Ht 6' (1.829 m)   Wt 76.7 kg   SpO2 98%   BMI 22.93 kg/m   Physical Exam: GEN NAD HEENT EOMI PERRL NECK+ JVD upright  PULM diffuse bilateral coarse inspiratory and expiratory crackles are much improved but still present CV tachycardic ABD soft EXT 2+ LE edema NEURO AAO x 3 SKIN R ulcer, 1-2 cm in diameter, dried, on ball of R foot.  Does not appear infected ACCESS R TDC, L AVF + T/B, well-healing incision  Labs: BMET Recent Labs  Lab 09/16/22 0928 09/16/22 1039 09/16/22 1053 09/17/22 0505  NA 137  137  --  140 136  K 3.8  3.7  --  4.5 3.7  CL 102  104  --  105 100  CO2 21*  21*  --   --  28  GLUCOSE 217*  210*  --  178* 99  BUN 38*  39*  --  40* 27*  CREATININE 6.53*  6.21*  --  7.60* 5.07*  CALCIUM 7.7*  7.3*  --   --  7.5*  PHOS 4.6 4.7*  --   --    CBC Recent Labs  Lab 09/16/22 0928 09/16/22 1053 09/17/22 0505 09/17/22 0756  WBC 20.1*  --  9.4  --   HGB 8.0* 9.9* 6.7* 6.9*  HCT 25.7* 29.0* 21.3*  21.8*  MCV 100.4*  --  97.7  --   PLT 255  --  191  --       Medications:     sodium chloride   Intravenous Once   amLODipine  10 mg Oral Daily   calcitRIOL  0.5 mcg Oral Daily   Chlorhexidine Gluconate Cloth  6 each Topical Q0600   darbepoetin (ARANESP) injection - DIALYSIS  100 mcg Subcutaneous Q Fri-1800   ferrous sulfate  325 mg Oral BID WC   furosemide  80 mg Oral Daily   hydrALAZINE  75 mg Oral Q8H   isosorbide mononitrate  30 mg Oral Daily   labetalol  200 mg Oral BID   sacubitril-valsartan  1 tablet Oral BID   sodium chloride flush  3 mL Intravenous Q12H     Bufford Buttner, MD 09/17/2022, 1:11 PM

## 2022-09-17 NOTE — TOC Initial Note (Signed)
Transition of Care [facility]) - Initial/Assessment Note    Patient Details  Name: Matthew Robles MRN: [id number] Date of Birth: 05-21-1956  [facility] CM/SW Contact:    Villa Herb, LCSWA Phone Number: 09/17/2022, 9:28 AM  Clinical Narrative:                 Pt is high risk for readmission. CSW spoke with pt to complete assessment. Pt states that he lives with his mother. Pt has needed transportation for appointments. Pt has not had HH services in the past. Pt states he had a walker and may still have at the home. Pt was [facility] from [facility] with home O2 set up through [employer]. TOC to follow.   Expected Discharge Plan: Home/Self Care Barriers to Discharge: Continued Medical Work up   Patient Goals and CMS Choice Patient states their goals for this hospitalization and ongoing recovery are:: return home CMS Medicare.gov Compare Post Acute Care list provided to:: Patient Choice offered to / list presented to : Patient      Expected Discharge Plan and Services In-house Referral: Clinical Social Work Discharge Planning Services: CM Consult   Living arrangements for the past 2 months: Single Family Home                                      Prior Living Arrangements/Services Living arrangements for the past 2 months: Single Family Home Lives with:: Parents Patient language and need for interpreter reviewed:: Yes Do you feel safe going back to the place where you live?: Yes      Need for Family Participation in Patient Care: Yes (Comment) Care giver support system in place?: Yes (comment) Current home services: DME Criminal Activity/Legal Involvement Pertinent to Current Situation/Hospitalization: No - Comment as needed  Activities of Daily Living Home Assistive Devices/Equipment: None ADL Screening (condition at time of admission) Patient's cognitive ability adequate to safely complete daily activities?: Yes Is the patient deaf or have difficulty hearing?:  No Does the patient have difficulty seeing, even when wearing glasses/contacts?: No Does the patient have difficulty concentrating, remembering, or making decisions?: No Patient able to express need for assistance with ADLs?: No Does the patient have difficulty dressing or bathing?: No Independently performs ADLs?: Yes (appropriate for developmental age) Does the patient have difficulty walking or climbing stairs?: No Weakness of Legs: None Weakness of Arms/Hands: None  Permission Sought/Granted                  Emotional Assessment Appearance:: Appears stated age Attitude/Demeanor/Rapport: Engaged Affect (typically observed): Accepting Orientation: : Oriented to Self, Oriented to Place, Oriented to  Time, Oriented to Situation Alcohol / Substance Use: Not Applicable Psych Involvement: No (comment)  Admission diagnosis:  Flash pulmonary edema (HCC) [J81.0] ESRD (end stage renal disease) on dialysis (HCC) [N18.6, Z99.2] Volume overload [E87.70] Acute respiratory failure (HCC) [J96.00] Patient Active Problem List   Diagnosis Date Noted   Volume overload 09/16/2022   Acute respiratory failure (HCC) 09/16/2022   Acute HFrEF (heart failure with reduced ejection fraction) (HCC) 06/30/2022   ESRD (end stage renal disease) (HCC) 06/25/2022   Acute respiratory failure with hypoxia (HCC) 06/24/2022   Acute pulmonary edema (HCC) 06/24/2022   Bilateral pleural effusion 06/24/2022   Hypoalbuminemia 06/24/2022   Hypertensive urgency 01/15/2016   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   DM (diabetes mellitus), type 2 with neurological complications (HCC) 01/15/2016  Hypokalemia 01/15/2016   PCP:  [facility] Pharmacy:   [facility] [address] [place] - [address] [place] [address] [place] [address] [place] [address] Phone: [phone] Fax: [phone]  [facility] [place], [place] - [address] [place] [address],  [place] [address] [place] [address] Phone: [phone] Fax: [phone]     Social Determinants of Health (SDOH) Social History: SDOH Screenings   Food Insecurity: No Food Insecurity (09/16/2022)  Housing: Low Risk  (09/16/2022)  Transportation Needs: No Transportation Needs (09/16/2022)  Utilities: Not At Risk (09/16/2022)  Tobacco Use: Medium Risk (09/16/2022)   SDOH Interventions:     Readmission Risk Interventions    09/17/2022    9:27 AM 06/29/2022   12:02 PM  Readmission Risk Prevention Plan  Transportation Screening Complete Complete  HRI or Home Care Consult Complete Complete  Social Work Consult for Recovery Care Planning/Counseling Complete Complete  Palliative Care Screening Not Applicable Not Applicable  Medication Review Oceanographer) Complete Complete

## 2022-09-17 NOTE — Assessment & Plan Note (Signed)
-  Anemia of chronic disease, end-stage renal disease    Latest Ref Rng & Units 09/18/2022    4:41 AM 09/17/2022    7:56 AM 09/17/2022    5:05 AM  CBC  WBC 4.0 - 10.5 K/uL 10.0   9.4   Hemoglobin 13.0 - 17.0 g/dL 8.7  6.9  6.7   Hematocrit 39.0 - 52.0 % 26.8  21.8  21.3   Platelets 150 - 400 K/uL 204   191    -s/p 1U PRBC blood transfusion 2/26 during hemodialysis nephrologist has ordered ESA  -Will monitor H&H closely

## 2022-09-17 NOTE — Consult Note (Addendum)
NAME:  Matthew Robles, MRN:  [id number], DOB:  09/16/1955, LOS: 1 ADMISSION DATE:  09/16/2022, CONSULTATION DATE:  09/17/2022  REFERRING MD:  Flossie Dibble, CHIEF COMPLAINT:  hemoptysis   I connected with  [name] on 09/17/22 by  video enabled telemedicine application and verified that I am speaking with the correct person using two identifiers.     Location: Patient: Matthew Robles, ICU bed 12 Provider: [facility]   I discussed the limitations of evaluation and management by telemedicine and the availability of in person appointments. The patient expressed understanding and agreed to proceed. I also discussed with the patient that there may be a patient responsible charge related to this service. The patient expressed understanding and agreed to proceed.   Patient consented to consult via video: Yes People present and their role in pt care: Pt , RN  History of Present Illness:  67 year old ex-smoker, ESRD on dialysis, admitted with shortness of breath and blood-tinged sputum for 2 days. He was found to be hypertensive 200/107 with bilateral crackles, leukocytosis 20 K, potassium 4.5.  He recently underwent second stage BVD/9/24 by Dr. Arbie Cookey, using tunneled catheter for dialysis currently He smoked about 30 pack years, quit 5 years ago, relapsed and is again quite now.  He has used marijuana before. Today he reports dark blood-tinged sputum and decreased quantity compared to yesterday  Pertinent  Medical History  HFrEF EF 45-50% ESRD on HD TTS (primary nephrologist Dr. Molli Barrows), DM II with neuropathy,  gout,  CVA,  chronic anemia.   Significant Hospital Events: Including procedures, antibiotic start and stop dates in addition to other pertinent events     Interim History / Subjective:   No chest pain, breathing is much improved  Objective   Blood pressure (!) 163/74, pulse 91, temperature 97.7 F (36.5 C), temperature source Oral, resp. rate 18, height 6'  (1.829 m), weight 76.7 kg, SpO2 98 %.        Intake/Output Summary (Last 24 hours) at 09/17/2022 1325 Last data filed at 09/16/2022 1700 Gross per 24 hour  Intake 120 ml  Output 3000 ml  Net -2880 ml   Filed Weights   09/16/22 1605 09/16/22 1638 09/17/22 0500  Weight: 75.5 kg 75.7 kg 76.7 kg    Examination: Sitting in a chair, no distress, able to speak in full sentences On nasal cannula No accessory muscle use  Labs show potassium 3.7, BUN 27, no leukocytosis, hemoglobin dropped from 8.0-6.7 Chest x-ray shows diffuse pulmonary edema pattern  Resolved Hospital Problem list     Assessment & Plan:  Acute hypoxic respiratory failure Hemoptysis -likely related to fluid overload In this heavy ex-smoker, agree with obtaining CT chest without contrast to look for underlying lung malignancy although chest x-ray is reassuring  Hypertension -improved ESRD -being managed by renal, repeat dialysis as planned today  HFrEF -okay to continue [facility] will follow-up on CT scan and be available as needed test does not show any evidence of lung malignancy  Best Practice (right click and "Reselect all SmartList Selections" daily)   Per [facility]  Labs   CBC: Recent Labs  Lab 09/16/22 0928 09/16/22 1053 09/17/22 0505 09/17/22 0756  WBC 20.1*  --  9.4  --   HGB 8.0* 9.9* 6.7* 6.9*  HCT 25.7* 29.0* 21.3* 21.8*  MCV 100.4*  --  97.7  --   PLT 255  --  191  --     Basic Metabolic Panel: Recent Labs  Lab 09/16/22  4098 09/16/22 1039 09/16/22 1053 09/17/22 0505  NA 137  137  --  140 136  K 3.8  3.7  --  4.5 3.7  CL 102  104  --  105 100  CO2 21*  21*  --   --  28  GLUCOSE 217*  210*  --  178* 99  BUN 38*  39*  --  40* 27*  CREATININE 6.53*  6.21*  --  7.60* 5.07*  CALCIUM 7.7*  7.3*  --   --  7.5*  MG  --  2.2  --   --   PHOS 4.6 4.7*  --   --    GFR: Estimated Creatinine Clearance: 15.1 mL/min (A) (by C-G formula based on SCr of 5.07 mg/dL (H)). Recent Labs   Lab 09/16/22 0928 09/17/22 0505  WBC 20.1* 9.4    Liver Function Tests: Recent Labs  Lab 09/16/22 0928  ALBUMIN 3.1*   No results for input(s): "LIPASE", "AMYLASE" in the last 168 hours. No results for input(s): "AMMONIA" in the last 168 hours.  ABG    Component Value Date/Time   PHART 7.25 (L) 06/24/2022 0547   PCO2ART 31 (L) 06/24/2022 0547   PO2ART 116 (H) 06/24/2022 0547   HCO3 13.6 (L) 06/24/2022 0547   TCO2 24 09/16/2022 1053   ACIDBASEDEF 12.4 (H) 06/24/2022 0547   O2SAT 99.3 06/24/2022 0547     Coagulation Profile: Recent Labs  Lab 09/16/22 1039 09/17/22 0505  INR 1.1 1.2    Cardiac Enzymes: No results for input(s): "CKTOTAL", "CKMB", "CKMBINDEX", "TROPONINI" in the last 168 hours.  HbA1C: Hgb A1c MFr Bld  Date/Time Value Ref Range Status  06/24/2022 11:36 AM 5.9 (H) 4.8 - 5.6 % Final    Comment:    (NOTE) Pre diabetes:          5.7%-6.4%  Diabetes:              >6.4%  Glycemic control for   <7.0% adults with diabetes   06/25/2020 10:56 AM 6.4 (H) 4.8 - 5.6 % Final    Comment:    (NOTE) Pre diabetes:          5.7%-6.4%  Diabetes:              >6.4%  Glycemic control for   <7.0% adults with diabetes     CBG: No results for input(s): "GLUCAP" in the last 168 hours.  Review of Systems:   Has been drinking soups Cramps following dialysis  Constitutional: negative for anorexia, fevers and sweats  Eyes: negative for irritation, redness and visual disturbance  Ears, nose, mouth, throat, and face: negative for earaches, epistaxis, nasal congestion and sore throat  Cardiovascular: negative for chest pain,  orthopnea, palpitations and syncope  Gastrointestinal: negative for abdominal pain, constipation, diarrhea, melena, nausea and vomiting  Genitourinary:negative for dysuria, frequency and hematuria  Hematologic/lymphatic: negative for bleeding, easy bruising and lymphadenopathy  Musculoskeletal:negative for arthralgias, muscle weakness  and stiff joints  Neurological: negative for coordination problems, gait problems, headaches and weakness  Endocrine: negative for diabetic symptoms including polydipsia, polyuria and weight loss   Past Medical History:  He,  has a past medical history of CHF (congestive heart failure) (HCC), Chronic kidney disease, Diabetes mellitus without complication (HCC), Hypertension, and Stroke (HCC).   Surgical History:   Past Surgical History:  Procedure Laterality Date   AV FISTULA PLACEMENT Left 07/13/2022   Procedure: LEFT ARM ARTERIOVENOUS (AV) FISTULA CREATION;  Surgeon: [name], MD;  Location: [facility];  Service: Vascular;  Laterality: Left;   BASCILIC VEIN TRANSPOSITION Left 08/31/2022   Procedure: LEFT ARM SECOND STAGE BASILIC VEIN TRANSPOSITION;  Surgeon: [name], MD;  Location: [facility];  Service: Vascular;  Laterality: Left;   INSERTION OF DIALYSIS CATHETER Right 06/25/2022   Procedure: INSERTION OF DIALYSIS CATHETER;  Surgeon: Lucretia Roers, MD;  Location: [facility];  Service: General;  Laterality: Right;   WISDOM TOOTH EXTRACTION       Social History:   reports that he quit smoking about 5 years ago. His smoking use included cigarettes. He smoked an average of 1 pack per day. He has never used smokeless tobacco. He reports that he does not currently use alcohol after a past usage of about 4.0 standard drinks of alcohol per week. He reports that he does not currently use drugs after having used the following drugs: Marijuana.   Family History:  His family history includes Diabetes in his maternal grandfather and mother; Hypertension in his mother.   Allergies No Known Allergies   Home Medications  Prior to Admission medications   Medication Sig Start Date End Date Taking? Authorizing Provider  amLODipine (NORVASC) 10 MG tablet Take 1 tablet (10 mg total) by mouth daily. 07/05/22  Yes Tat, Onalee Hua, MD  calcitRIOL (ROCALTROL) 0.5 MCG capsule Take 1 capsule (0.5 mcg total) by  mouth daily. 07/05/22  Yes Tat, Onalee Hua, MD  carvedilol (COREG) 25 MG tablet Take 2 tablets (50 mg total) by mouth 2 (two) times daily with a meal. 07/05/22  Yes Tat, David, MD  FEROSUL 325 (65 Fe) MG tablet Take 325 mg by mouth 2 (two) times daily. 08/18/22  Yes [provider]  furosemide (LASIX) 80 MG tablet Take 1 tablet (80 mg total) by mouth daily. 07/05/22  Yes Tat, Onalee Hua, MD  hydrALAZINE (APRESOLINE) 25 MG tablet Take 3 tablets (75 mg total) by mouth every 8 (eight) hours. 07/05/22  Yes Tat, Onalee Hua, MD  isosorbide mononitrate (IMDUR) 30 MG 24 hr tablet Take 1 tablet (30 mg total) by mouth daily. 07/05/22  Yes Tat, Onalee Hua, MD  sacubitril-valsartan (ENTRESTO) 97-103 MG Take 1 tablet by mouth 2 (two) times daily. 07/05/22  Yes Tat, Onalee Hua, MD  oxyCODONE-acetaminophen (PERCOCET) 5-325 MG tablet Take 1 tablet by mouth every 6 (six) hours as needed for severe pain. Patient not taking: Reported on 09/16/2022 08/31/22   Early, Kristen Loader, MD     Cyril Mourning MD. FCCP. [facility] Pager : [phone]  If no response to pager , please call [phone] until 7 pm After 7:00 pm call Elink  [phone]   09/17/2022  Addendum -HRCT shows pulmonary edema, no evidence of ILD.  No evidence of lung cancer. [facility] will be available as needed  Quron Ruddy V. Vassie Loll MD

## 2022-09-17 NOTE — Evaluation (Signed)
Physical Therapy Evaluation Patient Details Name: Matthew Robles MRN: [id number] DOB: 1956-01-31 Today's Date: 09/17/2022  History of Present Illness  Matthew Robles is a 67 year old male with longstanding history of hypertension, ESRD on HD TTS (primary nephrologist Dr. Molli Barrows), DM II  with neuropathy, gout, sleep disorder, CVA, reduced EF CHF, chronic anemia..  Presented to the ED today with chief complaint of shortness of breath.     Patient is compliant with medication up-to-date with hemodialysis he started noticing some shortness of breath yesterday and increasing his oxygen demand.  This morning at hemodialysis center he was found to be worsening with shortness of breath and elevated blood pressure he desatted to 70s according to the report.  Blood pressure on arrival was 200/107-with audible rails    Clinical Impression  Patient O2 sat monitored throughout session: O2 at 2L with ambulation sat decrease to 86-88 from mid 90s at rest. O2 sat improves to 88-92% on 4 L with ambulation, increase to 98 % rest on 4L. Patient able to complete all mobility independently with mild unsteadiness with standing/ambulating but no loss of balance. Patient discharged to care of nursing for ambulation daily as tolerated for length of stay.        Recommendations for follow up therapy are one component of a multi-disciplinary discharge planning process, led by the attending physician.  Recommendations may be updated based on patient status, additional functional criteria and insurance authorization.  Follow Up Recommendations       Assistance Recommended at Discharge PRN  Patient can return home with the following       Equipment Recommendations None recommended by PT  Recommendations for Other Services       Functional Status Assessment Patient has had a recent decline in their functional status and demonstrates the ability to make significant improvements in function in a reasonable and  predictable amount of time.     Precautions / Restrictions Precautions Precautions: Fall Restrictions Weight Bearing Restrictions: No      Mobility  Bed Mobility Overal bed mobility: Independent             General bed mobility comments: supine to sitting EOB independent    Transfers Overall transfer level: Independent                 General transfer comment: completes sit to stand t/f and t/f to chair independently    Ambulation/Gait Ambulation/Gait assistance: Modified independent (Device/Increase time) Gait Distance (Feet): 100 Feet Assistive device: None Gait Pattern/deviations: Step-through pattern, Decreased stride length       General Gait Details: slightly slow  Stairs            Wheelchair Mobility    Modified Rankin (Stroke Patients Only)       Balance Overall balance assessment: Independent                                           Pertinent Vitals/Pain Pain Assessment Pain Assessment: Faces Faces Pain Scale: Hurts a little bit Pain Location: Pt reports having some cramping in stomach and LE that has gotten better Pain Descriptors / Indicators: Cramping Pain Intervention(s): Monitored during session    Home Living Family/patient expects to be discharged to:: Private residence Living Arrangements: Parent Available Help at Discharge: Family Type of Home: House Home Access: Stairs to enter Entrance Stairs-Rails: Right Entrance Stairs-Number of Steps: 3  Home Equipment:  (Reports he uses a stool as a shower chair)      Prior Function Prior Level of Function : Independent/Modified Independent             Mobility Comments: 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/driving independently ADLs Comments: independent     Hand Dominance   Dominant Hand: Right    Extremity/Trunk Assessment   Upper Extremity Assessment Upper Extremity Assessment: Defer to OT evaluation    Lower  Extremity Assessment Lower Extremity Assessment: Overall WFL for tasks assessed    Cervical / Trunk Assessment Cervical / Trunk Assessment: Normal  Communication   Communication: No difficulties  Cognition Arousal/Alertness: Awake/alert Behavior During Therapy: WFL for tasks assessed/performed Overall Cognitive Status: Within Functional Limits for tasks assessed                                          General Comments      Exercises     Assessment/Plan    PT Assessment Patient does not need any further PT services  PT Problem List         PT Treatment Interventions      PT Goals (Current goals can be found in the Care Plan section)  Acute Rehab PT Goals Patient Stated Goal: return home PT Goal Formulation: With patient Time For Goal Achievement: 09/17/22 Potential to Achieve Goals: Good    Frequency       Co-evaluation PT/OT/SLP Co-Evaluation/Treatment: Yes Reason for Co-Treatment: To address functional/ADL transfers PT goals addressed during session: Mobility/safety with mobility;Balance;Strengthening/ROM OT goals addressed during session: ADL's and self-care       AM-PAC PT "6 Clicks" Mobility  Outcome Measure Help needed turning from your back to your side while in a flat bed without using bedrails?: None Help needed moving from lying on your back to sitting on the side of a flat bed without using bedrails?: None Help needed moving to and from a bed to a chair (including a wheelchair)?: None Help needed standing up from a chair using your arms (e.g., wheelchair or bedside chair)?: None Help needed to walk in hospital room?: None Help needed climbing 3-5 steps with a railing? : None 6 Click Score: 24    End of Session Equipment Utilized During Treatment: Oxygen Activity Tolerance: Patient tolerated treatment well Patient left: in chair;with call bell/phone within reach Nurse Communication: Mobility status PT Visit Diagnosis: Other  abnormalities of gait and mobility (R26.89)    Time: 9604-5409 PT Time Calculation (min) (ACUTE ONLY): 24 min   Charges:   PT Evaluation $PT Eval Low Complexity: 1 Low PT Treatments $Therapeutic Activity: 8-22 mins        9:51 AM, 09/17/22 Wyman Songster PT, DPT Physical Therapist at [facility]

## 2022-09-17 NOTE — Progress Notes (Signed)
PROGRESS NOTE    Patient: Matthew Robles                            PCP: [facility]                    DOB: 13-Jul-1955            DOA: 09/16/2022 WUJ:[id number]             DOS: 09/17/2022, 1:44 PM   LOS: 1 day   Date of Service: The patient was seen and examined on 09/17/2022  Subjective:   The patient was seen and examined this morning. Hemodynamically stable. Complaining of cough with bloody sputum Blood pressure has stabilized, tolerated hemodialysis yesterday. Anticipating another hemodialysis today Open hemoglobin to 6.7, 6.9, with no active site of bleeding   Brief Narrative:   Quintavius Niebuhr is a 67 year old male with longstanding history of hypertension, ESRD on HD TTS (primary nephrologist Dr. Molli Barrows), DM II  with neuropathy, gout, sleep disorder, CVA, reduced EF CHF, chronic anemia..  Presented to the ED today with chief complaint of shortness of breath.  Patient is compliant with medication up-to-date with hemodialysis he started noticing some shortness of breath yesterday and increasing his oxygen demand.  This morning at hemodialysis center he was found to be worsening with shortness of breath and elevated blood pressure he desatted to 70s according to the report. Blood pressure on arrival was 200/107-with audible rails  ED course/evaluation: On arrival BP 200/107 Blood pressure (!) 190/94, pulse 80, temperature 98 F (36.7 C), weight 78.5 kg, SpO2 96 %.  BiPAP Patient was placed on BiPAP  Afebrile with WBC of 20.1, hemoglobin 8.0, 9.9, potassium 4.5, chloride 105, CO2 21, BUN 40, creatinine 7.60, calcium 7.3, glucose 210, 178  EDP discussed with nephrologist Dr. Signe Colt who initiated hemodialysis  Symptomatic likely due to volume overload, also requested admission for inpatient hemodialysis and BP management and acute respiratory failure due to volume overload.      Assessment & Plan:   Principal Problem:   Volume  overload Active Problems:   ESRD (end stage renal disease) (HCC)   Acute HFrEF (heart failure with reduced ejection fraction) (HCC)   Hemoptysis   Hypertensive urgency   DM (diabetes mellitus), type 2 with neurological complications (HCC)   Acute respiratory failure with hypoxia (HCC)   Anemia of chronic disease     Assessment and Plan: * Volume overload - Patient restarted hemodialysis anticipating more fluid off -EDP discussed with Dr. Signe Colt nephrologist patient may need further hemodialysis more aggressive fluid extraction, and optimizing blood pressure control Filed Weights   09/16/22 1605 09/16/22 1638 09/17/22 0500  Weight: 75.5 kg 75.7 kg 76.7 kg   -Continue hemodialysis, 09/16/2022, 09/17/2022     Hemoptysis - Patient has persistent cough, with blood-tinged sputum -But Not complaining of overt continuous bleeding or vomiting blood -Obtaining high-resolution CT scan of the chest -Consulted PCCM Dr. Vassie Loll for further evaluation recommendations -Current cough meds    Acute HFrEF (heart failure with reduced ejection fraction) (HCC) -HFrEF  - Reviewed last echocardiogram 06/24/2022, EEG GAF 45-50%, global hypokinesis LV, moderate concentric LV hypertrophy, grade 2 diastolic dysfunction, left atrial size severely dilated, right atrial size moderately dilated, left pleural effusion noted, mitral valve regurgitation, mildly calcified aortic valve, -Continuing diuretics including hemodialysis -Monitoring daily weight -Continue home medication including Entresto, Imdur, beta-blocker switched to labetalol for better blood pressure control  Filed Weights   09/16/22 1605 09/16/22 1638 09/17/22 0500  Weight: 75.5 kg 75.7 kg 76.7 kg    Intake/Output Summary (Last 24 hours) at 09/17/2022 1338 Last data filed at 09/16/2022 1700 Gross per 24 hour  Intake 120 ml  Output 3000 ml  Net -2880 ml       ESRD (end stage renal disease) (HCC) -  Lab Results  Component Value Date    CREATININE 5.07 (H) 09/17/2022   CREATININE 7.60 (H) 09/16/2022   CREATININE 6.21 (H) 09/16/2022   CREATININE 6.53 (H) 09/16/2022   -EDP has discussed with nephrologist Dr. Signe Colt -Normal hemodialysis days TTS -Patient hemodialysis 09/16/2022 -Dialysis 09/17/2022   Acute respiratory failure (HCC) Off BiPAP, likely due to volume overload, -Respiratory distress has improved weaning down to 2 L of oxygen, maintaining O2 sat greater 92%  Anemia of chronic disease -Anemia of chronic disease, end-stage renal disease    Latest Ref Rng & Units 09/17/2022    7:56 AM 09/17/2022    5:05 AM 09/16/2022   10:53 AM  CBC  WBC 4.0 - 10.5 K/uL  9.4    Hemoglobin 13.0 - 17.0 g/dL 6.9  6.7  9.9   Hematocrit 39.0 - 52.0 % 21.8  21.3  29.0   Platelets 150 - 400 K/uL  191     -Pursuing with 1U PRBC blood transfusion today during hemodialysis nephrologist has ordered ESA  -Will monitor H&H closely  Acute respiratory failure with hypoxia (HCC) -Improved off BiPAP - Due to accelerated hypertension, volume overload -Restarting oral BP meds, with a as needed hydralazine available, -Treating volume overload with hemodialysis now -Monitoring closely -Initiated hemodialysis BiPAP satting 96%>>>  weaning patient down accordingly--- currently on 2 L of oxygen satting 98%  DM (diabetes mellitus), type 2 with neurological complications (HCC) -Reviewing med list currently diabetic medication Last A1c 2 months ago 5.9, 2 years ago 6.4, -Will check A1c -Will check CBG q. ACHS, SSI coverage  Hypertensive urgency - On arrival to ED 200/107>>>Blood pressure (!) 190/94,  >>> 163/74  -Home medication resumed: Entresto, Norvasc, hydralazine, Imdur, -Changed Coreg to labetalol -better BP control - along with as needed IV hydralazine, labetalol       ----------------------------------------------------------------------------------------------------------------------------------------------- Nutritional  status:  The patient's BMI is: Body mass index is 22.93 kg/m. I agree with the assessment and plan as outlined ------------------------------------------------------------------------------------------------------------------------  DVT prophylaxis:  TED hose Start: 09/16/22 1214 SCDs Start: 09/16/22 1214   Code Status:   Code Status: Full Code  Family Communication: No family member present at bedside- attempt will be made to update daily The above findings and plan of care has been discussed with patient (and family)  in detail,  they expressed understanding and agreement of above. -Advance care planning has been discussed.   Admission status:   Status is: Inpatient Remains inpatient appropriate because: Needing further dialysis, blood transfusion, medication adjustment for hypertension, hemoptysis for pulmonary evaluation   Disposition: From  - home             Planning for discharge in 1-2 days: to   Procedures:   No admission procedures for hospital encounter.   Antimicrobials:  Anti-infectives (From admission, onward)    None        Medication:   sodium chloride   Intravenous Once   amLODipine  10 mg Oral Daily   calcitRIOL  0.5 mcg Oral Daily   Chlorhexidine Gluconate Cloth  6 each Topical Q0600   darbepoetin (ARANESP) injection - DIALYSIS  100  mcg Subcutaneous Q Fri-1800   ferrous sulfate  325 mg Oral BID WC   furosemide  80 mg Oral Daily   hydrALAZINE  75 mg Oral Q8H   isosorbide mononitrate  30 mg Oral Daily   labetalol  200 mg Oral BID   sacubitril-valsartan  1 tablet Oral BID   sodium chloride flush  3 mL Intravenous Q12H    acetaminophen **OR** acetaminophen, alteplase, anticoagulant sodium citrate, bisacodyl, heparin, hydrALAZINE, HYDROmorphone (DILAUDID) injection, ipratropium, levalbuterol, lidocaine (PF), lidocaine-prilocaine, ondansetron **OR** ondansetron (ZOFRAN) IV, oxyCODONE, pentafluoroprop-tetrafluoroeth, senna-docusate, sodium chloride  flush, traZODone   Objective:   Vitals:   09/17/22 0700 09/17/22 0800 09/17/22 0806 09/17/22 1124  BP: (!) 137/58 (!) 163/74    Pulse: 75 91    Resp: 14 18    Temp:   98.1 F (36.7 C) 97.7 F (36.5 C)  TempSrc:   Oral Oral  SpO2: 99% 98%    Weight:      Height:        Intake/Output Summary (Last 24 hours) at 09/17/2022 1344 Last data filed at 09/16/2022 1700 Gross per 24 hour  Intake 120 ml  Output 3000 ml  Net -2880 ml   Filed Weights   09/16/22 1605 09/16/22 1638 09/17/22 0500  Weight: 75.5 kg 75.7 kg 76.7 kg     Physical examination:   Constitution:  Alert, cooperative, no distress,  Appears calm and comfortable  Psychiatric:   Normal and stable mood and affect, cognition intact,   HEENT:        Normocephalic, PERRL, otherwise with in Normal limits  Chest:         Chest symmetric Cardio vascular:  S1/S2, RRR, No murmure, No Rubs or Gallops  pulmonary: Clear to auscultation bilaterally, respirations unlabored, negative wheezes / crackles Abdomen: Soft, non-tender, non-distended, bowel sounds,no masses, no organomegaly Muscular skeletal: Limited exam - in bed, able to move all 4 extremities,   Neuro: CNII-XII intact. , normal motor and sensation, reflexes intact  Extremities: No pitting edema lower extremities, +2 pulses  Skin: Dry, warm to touch, negative for any Rashes, No open wounds Wounds: per nursing documentation   ------------------------------------------------------------------------------------------------------------------------------------------    LABs:     Latest Ref Rng & Units 09/17/2022    7:56 AM 09/17/2022    5:05 AM 09/16/2022   10:53 AM  CBC  WBC 4.0 - 10.5 K/uL  9.4    Hemoglobin 13.0 - 17.0 g/dL 6.9  6.7  9.9   Hematocrit 39.0 - 52.0 % 21.8  21.3  29.0   Platelets 150 - 400 K/uL  191        Latest Ref Rng & Units 09/17/2022    5:05 AM 09/16/2022   10:53 AM 09/16/2022    9:28 AM  CMP  Glucose 70 - 99 mg/dL 99  161  096    045    BUN 8 - 23 mg/dL 27  40  39    38   Creatinine 0.61 - 1.24 mg/dL 4.09  8.11  9.14    7.82   Sodium 135 - 145 mmol/L 136  140  137    137   Potassium 3.5 - 5.1 mmol/L 3.7  4.5  3.7    3.8   Chloride 98 - 111 mmol/L 100  105  104    102   CO2 22 - 32 mmol/L 28   21    21    Calcium 8.9 - 10.3 mg/dL 7.5   7.3  7.7        Micro Results Recent Results (from the past 240 hour(s))  MRSA Next Gen by PCR, Nasal     Status: None   Collection Time: 09/16/22  7:30 PM   Specimen: Nasal Mucosa; Nasal Swab  Result Value Ref Range Status   MRSA by PCR Next Gen NOT DETECTED NOT DETECTED Final    Comment: (NOTE) The GeneXpert MRSA Assay (FDA approved for NASAL specimens only), is one component of a comprehensive MRSA colonization surveillance program. It is not intended to diagnose MRSA infection nor to guide or monitor treatment for MRSA infections. Test performance is not FDA approved in patients less than 58 years old. Performed at [facility], [address] [place], [place] [address]     Radiology Reports No results found.  SIGNED: Kendell Bane, MD, FHM. FAAFP. [facility] - [facility] Time spent - 55 min.  In seeing, evaluating and examining the patient. Reviewing medical records, labs, drawn plan of care. [facility],  Pager (please use [email or web address] to page/ text) Please use Epic Secure Chat for non-urgent communication (7AM-7PM)  If 7PM-7AM, please contact night-coverage [email or web address], 09/17/2022, 1:44 PM

## 2022-09-17 NOTE — Evaluation (Signed)
Occupational Therapy Evaluation Patient Details Name: Matthew Robles MRN: [id number] DOB: 1956/01/29 Today's Date: 09/17/2022   History of Present Illness Matthew Robles is a 67 year old male with longstanding history of hypertension, ESRD on HD TTS (primary nephrologist Dr. Molli Barrows), DM II  with neuropathy, gout, sleep disorder, CVA, reduced EF CHF, chronic anemia..  Presented to the ED today with chief complaint of shortness of breath.     Patient is compliant with medication up-to-date with hemodialysis he started noticing some shortness of breath yesterday and increasing his oxygen demand.  This morning at hemodialysis center he was found to be worsening with shortness of breath and elevated blood pressure he desatted to 70s according to the report.  Blood pressure on arrival was 200/107-with audible rails   Clinical Impression   Pt agreeable to OT evaluation and participates well. He reports that he lives at home with his mom and does not have assistance at home if needed. He stated that prior to admission he was independent with all ADLs/IADLs/driving. Pt demonstrated good UE strength and ROM. Pt completed bed mobility independently and completed all t/f independently. Pt was able to walk around ICU unit with SBA- pt was on 4L on O2 prior to mobility. Bumped O2 down to 2L and pt maintained sats while seated EOB but had to bump back up to 4L during functional mobility due to desats into 80s. Pt reports h feels like he is back to baseline as long as he "can keep the fluid off of him". Discussed obtaining a pulse oximeter for use at home to keep and eye on his OT levels. Pt is able to d/c without OT services.       Recommendations for follow up therapy are one component of a multi-disciplinary discharge planning process, led by the attending physician.  Recommendations may be updated based on patient status, additional functional criteria and insurance authorization.   Assistance Recommended  at Discharge PRN  Patient can return home with the following Assistance with cooking/housework    Functional Status Assessment  Patient has had a recent decline in their functional status and demonstrates the ability to make significant improvements in function in a reasonable and predictable amount of time.  Equipment Recommendations  None recommended by OT    Recommendations for Other Services       Precautions / Restrictions Precautions Precautions: Fall Restrictions Weight Bearing Restrictions: No      Mobility Bed Mobility Overal bed mobility: Independent             General bed mobility comments: supine to sitting EOB independent    Transfers Overall transfer level: Independent                 General transfer comment: completes sit to stand t/f and t/f to chair independently      Balance Overall balance assessment: Independent                                         ADL either performed or assessed with clinical judgement   ADL Overall ADL's : Independent                                             Vision Baseline Vision/History: 1 Wears glasses (pt reports that he  will be getting a new prescription this week) Ability to See in Adequate Light: 0 Adequate Patient Visual Report: No change from baseline Vision Assessment?: No apparent visual deficits                Pertinent Vitals/Pain Pain Assessment Pain Assessment: Faces Pain Score: 1  Faces Pain Scale: Hurts a little bit Pain Location: Pt reports having some cramping in stomach and LE that has gotten better Pain Descriptors / Indicators: Cramping Pain Intervention(s): Monitored during session     Hand Dominance Right   Extremity/Trunk Assessment Upper Extremity Assessment Upper Extremity Assessment: Overall WFL for tasks assessed   Lower Extremity Assessment Lower Extremity Assessment: Defer to PT evaluation   Cervical / Trunk  Assessment Cervical / Trunk Assessment: Normal   Communication Communication Communication: No difficulties   Cognition Arousal/Alertness: Awake/alert Behavior During Therapy: WFL for tasks assessed/performed Overall Cognitive Status: Within Functional Limits for tasks assessed                                                        Home Living Family/patient expects to be discharged to:: Private residence Living Arrangements: Parent Available Help at Discharge: Family Type of Home: House Home Access: Stairs to enter Secretary/administrator of Steps: 3 Entrance Stairs-Rails: Right       Bathroom Shower/Tub: Chief Strategy Officer: Standard Bathroom Accessibility: Yes How Accessible: Accessible via walker Home Equipment:  (Reports he uses a stool as a shower chair)          Prior Functioning/Environment Prior Level of Function : Independent/Modified Independent             Mobility Comments: 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/driving independently ADLs Comments: independent        OT Problem List: Decreased activity tolerance      OT Treatment/Interventions:      OT Goals(Current goals can be found in the care plan section) Acute Rehab OT Goals Patient Stated Goal: to return home OT Goal Formulation: With patient Potential to Achieve Goals: Good  OT Frequency:      Co-evaluation PT/OT/SLP Co-Evaluation/Treatment: Yes Reason for Co-Treatment: To address functional/ADL transfers   OT goals addressed during session: ADL's and self-care      AM-PAC OT "6 Clicks" Daily Activity     Outcome Measure Help from another person eating meals?: None Help from another person taking care of personal grooming?: None Help from another person toileting, which includes using toliet, bedpan, or urinal?: None Help from another person bathing (including washing, rinsing, drying)?: None Help from another person to put  on and taking off regular upper body clothing?: None Help from another person to put on and taking off regular lower body clothing?: None 6 Click Score: 24   End of Session Equipment Utilized During Treatment: Oxygen (4L) Nurse Communication: Mobility status  Activity Tolerance: Patient tolerated treatment well Patient left: in chair;with call bell/phone within reach  OT Visit Diagnosis:  (SOB)                Time: 1610-9604 OT Time Calculation (min): 24 min Charges:  OT General Charges $OT Visit: 1 Visit  Bevelyn Ngo, OTR/L  09/17/2022, 9:32 AM

## 2022-09-17 NOTE — Progress Notes (Signed)
  HEMODIALYSIS TREATMENT NOTE:  Uneventful extra HD session completed - 3 hours isolated UF only.  One unit pRBC transfused with tx. Goal met: 3.4 liters removed without interruption in UF.  All blood was returned.  For regularly scheduled HD session tomorrow, Sat.  Post-dialysis:  09/17/22 2223  Vitals  Temp 97.8 F (36.6 C)  Temp Source Oral  BP (!) 165/88  MAP (mmHg) 117  BP Location Right Arm  BP Method Automatic  Patient Position (if appropriate) Sitting  Pulse Rate 85  Pulse Rate Source Monitor  ECG Heart Rate 86  Resp 18  Oxygen Therapy  SpO2 100 %  O2 Device Room Air  Post Treatment  Dialyzer Clearance Lightly streaked  Duration of HD Treatment -hour(s) 3 hour(s)  Hemodialysis Intake (mL) 415 mL  Liters Processed 56.1  Fluid Removed (mL) 3400 mL  Tolerated HD Treatment Yes  Post-Hemodialysis Comments Goal met  Fistula / Graft Left Forearm Arteriovenous fistula  Placement Date/Time: 07/13/22 1045   Orientation: Left  Access Location: Forearm  Access Type: (c) Arteriovenous fistula  Fistula / Graft Assessment Thrill;Bruit  Status Patent  Hemodialysis Catheter Right Internal jugular Double lumen Permanent (Tunneled)  Placement Date/Time: 06/25/22 1759   Placed prior to admission: No  Serial / Lot #: [id number]  Expiration Date: 06/24/26  Time Out: Correct patient;Correct site;Correct procedure  Maximum sterile barrier precautions: Hand hygiene;Cap;Mask;Sterile go...  Site Condition No complications  Blue Lumen Status Flushed;Heparin locked;Dead end cap in place  Red Lumen Status Flushed;Heparin locked;Dead end cap in place  Purple Lumen Status N/A  Catheter fill solution Heparin 1000 units/ml  Catheter fill volume (Arterial) 1.9 cc  Catheter fill volume (Venous) 1.9  Dressing Type Transparent;Tube stabilization device  Dressing Status Antimicrobial disc in place;Clean, Dry, Intact  Drainage Description None  Dressing Change Due 09/23/22  Post treatment catheter  status Capped and Clamped   Arman Filter, RN AP [facility]

## 2022-09-18 DIAGNOSIS — E8770 Fluid overload, unspecified: Secondary | ICD-10-CM | POA: Diagnosis not present

## 2022-09-18 LAB — BASIC METABOLIC PANEL
Anion gap: 11 (ref 5–15)
BUN: 43 mg/dL — ABNORMAL HIGH (ref 8–23)
CO2: 24 mmol/L (ref 22–32)
Calcium: 7.7 mg/dL — ABNORMAL LOW (ref 8.9–10.3)
Chloride: 100 mmol/L (ref 98–111)
Creatinine, Ser: 6.6 mg/dL — ABNORMAL HIGH (ref 0.61–1.24)
GFR, Estimated: 9 mL/min — ABNORMAL LOW (ref >60)
Glucose, Bld: 103 mg/dL — ABNORMAL HIGH (ref 70–99)
Potassium: 3.8 mmol/L (ref 3.5–5.1)
Sodium: 135 mmol/L (ref 135–145)

## 2022-09-18 LAB — CBC
HCT: 26.8 % — ABNORMAL LOW (ref 39.0–52.0)
Hemoglobin: 8.7 g/dL — ABNORMAL LOW (ref 13.0–17.0)
MCH: 30.4 pg (ref 26.0–34.0)
MCHC: 32.5 g/dL (ref 30.0–36.0)
MCV: 93.7 fL (ref 80.0–100.0)
Platelets: 204 10*3/uL (ref 150–400)
RBC: 2.86 MIL/uL — ABNORMAL LOW (ref 4.22–5.81)
RDW: 15.5 % (ref 11.5–15.5)
WBC: 10 10*3/uL (ref 4.0–10.5)
nRBC: 0 % (ref 0.0–0.2)

## 2022-09-18 LAB — GLUCOSE, CAPILLARY: Glucose-Capillary: 130 mg/dL — ABNORMAL HIGH (ref 70–99)

## 2022-09-18 MED ORDER — HEPARIN SODIUM (PORCINE) 1000 UNIT/ML IJ SOLN
INTRAMUSCULAR | Status: AC
  Filled 2022-09-18: qty 8

## 2022-09-18 MED ORDER — HYDRALAZINE HCL 25 MG PO TABS: 75.0000 mg | ORAL_TABLET | Freq: Three times a day (TID) | ORAL | 1 refills | Status: DC

## 2022-09-18 MED ORDER — SACUBITRIL-VALSARTAN 97-103 MG PO TABS: 1.0000 | ORAL_TABLET | Freq: Two times a day (BID) | ORAL | 1 refills | Status: AC

## 2022-09-18 MED ORDER — FUROSEMIDE 80 MG PO TABS: 80.0000 mg | ORAL_TABLET | Freq: Every day | ORAL | 1 refills | Status: DC

## 2022-09-18 MED ORDER — LABETALOL HCL 200 MG PO TABS: 200.0000 mg | ORAL_TABLET | Freq: Two times a day (BID) | ORAL | 1 refills | Status: DC

## 2022-09-18 MED ORDER — CARVEDILOL 25 MG PO TABS: 50.0000 mg | ORAL_TABLET | Freq: Two times a day (BID) | ORAL | 1 refills | Status: DC

## 2022-09-18 MED ORDER — AMLODIPINE BESYLATE 10 MG PO TABS: 10.0000 mg | ORAL_TABLET | Freq: Every day | ORAL | 1 refills | Status: DC

## 2022-09-18 MED ORDER — HEPARIN SODIUM (PORCINE) 1000 UNIT/ML DIALYSIS
20.0000 [IU]/kg | INTRAMUSCULAR | Status: DC | PRN
  Administered 2022-09-18: 1500 [IU] via INTRAVENOUS_CENTRAL

## 2022-09-18 NOTE — Discharge Summary (Signed)
Physician Discharge Summary   Patient: Matthew Robles MRN: [id number] DOB: July 20, 1955  Admit date:     09/16/2022  Discharge date: 09/18/22  Discharge Physician: Kendell Bane   PCP: [facility]   Recommendations at discharge:  Follow-up with nephrologist and hemodialysis as scheduled Follow with the PCP in 2-4 weeks Follow-up with pulmonologist in 2-4 weeks Continue taking your current medications as a scheduled  Discharge Diagnoses: Principal Problem:   Volume overload Active Problems:   ESRD (end stage renal disease) (HCC)   Acute HFrEF (heart failure with reduced ejection fraction) (HCC)   Hemoptysis   Hypertensive urgency   DM (diabetes mellitus), type 2 with neurological complications (HCC)   Acute respiratory failure with hypoxia (HCC)   Anemia of chronic disease  Resolved Problems:   * No resolved hospital problems. *  Hospital Course: Kempton Milne is a 67 year old male with longstanding history of hypertension, ESRD on HD TTS (primary nephrologist Dr. Molli Barrows), DM II  with neuropathy, gout, sleep disorder, CVA, reduced EF CHF, chronic anemia..  Presented to the ED today with chief complaint of shortness of breath.  Patient is compliant with medication up-to-date with hemodialysis he started noticing some shortness of breath yesterday and increasing his oxygen demand.  This morning at hemodialysis center he was found to be worsening with shortness of breath and elevated blood pressure he desatted to 70s according to the report. Blood pressure on arrival was 200/107-with audible rails  ED course/evaluation: On arrival BP 200/107 Blood pressure (!) 190/94, pulse 80, temperature 98 F (36.7 C), weight 78.5 kg, SpO2 96 %.  BiPAP Patient was placed on BiPAP  Afebrile with WBC of 20.1, hemoglobin 8.0, 9.9, potassium 4.5, chloride 105, CO2 21, BUN 40, creatinine 7.60, calcium 7.3, glucose 210, 178  EDP discussed with nephrologist Dr.  Signe Colt who initiated hemodialysis  Symptomatic likely due to volume overload, also requested admission for inpatient hemodialysis and BP management and acute respiratory failure due to volume overload.    Assessment and Plan: * Volume overload - Patient restarted hemodialysis anticipating more fluid off -EDP discussed with Dr. Signe Colt nephrologist patient may need further hemodialysis more aggressive fluid extraction, and optimizing blood pressure control Filed Weights   09/17/22 0500 09/17/22 1845 09/18/22 0359  Weight: 76.7 kg 77.1 kg 74.6 kg   -Continue hemodialysis, 09/16/2022, 09/17/2022     Hemoptysis - Patient has persistent cough, with blood-tinged sputum -But Not complaining of overt continuous bleeding or vomiting blood -High-resolution CT scan of the chest revealed -Consulted PCCM Dr. Vassie Loll believes the episode was due to 6 hours hypertension, volume overload -Current cough meds    Acute HFrEF (heart failure with reduced ejection fraction) (HCC) -HFrEF  - Reviewed last echocardiogram 06/24/2022, EEG GAF 45-50%, global hypokinesis LV, moderate concentric LV hypertrophy, grade 2 diastolic dysfunction, left atrial size severely dilated, right atrial size moderately dilated, left pleural effusion noted, mitral valve regurgitation, mildly calcified aortic valve, -Continuing diuretics including hemodialysis -Monitoring daily weight -Continue home medication including Entresto, Imdur, beta-blocker switched to labetalol for better blood pressure control  Filed Weights   09/17/22 0500 09/17/22 1845 09/18/22 0359  Weight: 76.7 kg 77.1 kg 74.6 kg    Intake/Output Summary (Last 24 hours) at 09/18/2022 0729 Last data filed at 09/17/2022 2223 Gross per 24 hour  Intake 315 ml  Output 3400 ml  Net -3085 ml       ESRD (end stage renal disease) (HCC) -  Lab Results  Component Value Date  CREATININE 6.60 (H) 09/18/2022   CREATININE 5.07 (H) 09/17/2022   CREATININE 7.60 (H)  09/16/2022   -EDP has discussed with nephrologist Dr. Signe Colt -Normal hemodialysis days TTS -Patient hemodialysis 09/16/2022 -Dialysis 09/17/2022   Acute respiratory failure (HCC) Off BiPAP, likely due to volume overload, -Respiratory distress has improved weaning down to 2 L of oxygen, maintaining O2 sat greater 92%  Anemia of chronic disease -Anemia of chronic disease, end-stage renal disease    Latest Ref Rng & Units 09/18/2022    4:41 AM 09/17/2022    7:56 AM 09/17/2022    5:05 AM  CBC  WBC 4.0 - 10.5 K/uL 10.0   9.4   Hemoglobin 13.0 - 17.0 g/dL 8.7  6.9  6.7   Hematocrit 39.0 - 52.0 % 26.8  21.8  21.3   Platelets 150 - 400 K/uL 204   191    -s/p 1U PRBC blood transfusion 2/26 during hemodialysis nephrologist has ordered ESA  -Will monitor H&H closely  Acute respiratory failure with hypoxia (HCC) -Improved off BiPAP - Due to accelerated hypertension, volume overload -Restarting oral BP meds, with a as needed hydralazine available, -Treating volume overload with hemodialysis now  Off BiPAP, currently on 2 L of oxygen satting 98%  DM (diabetes mellitus), type 2 with neurological complications (HCC) -Reviewing med list currently diabetic medication Last A1c 2 months ago 5.9, 2 years ago 6.4,   Hypertensive urgency - On arrival to ED 200/107>>>Blood pressure (!) 190/94,  >>> 163/74>>> 126/62  -Home medication resumed: Entresto, Norvasc, hydralazine, Imdur, -Changed Coreg to labetalol -better BP control       Consultants: Nephrologist Dr. Upton/pulmonologist Dr. Freida Busman Procedures performed: CT chest noncontrast/hemodialysis x 2 Disposition: Home Diet recommendation:  Discharge Diet Orders (From admission, onward)     Start     Ordered   09/18/22 0000  Diet - low sodium heart healthy        09/18/22 0728           Renal diet DISCHARGE MEDICATION: Allergies as of 09/18/2022   No Known Allergies      Medication List     STOP taking these medications     carvedilol 25 MG tablet Commonly known as: COREG       TAKE these medications    amLODipine 10 MG tablet Commonly known as: NORVASC Take 1 tablet (10 mg total) by mouth daily.   calcitRIOL 0.5 MCG capsule Commonly known as: ROCALTROL Take 1 capsule (0.5 mcg total) by mouth daily.   FeroSul 325 (65 FE) MG tablet Generic drug: ferrous sulfate Take 325 mg by mouth 2 (two) times daily.   furosemide 80 MG tablet Commonly known as: LASIX Take 1 tablet (80 mg total) by mouth daily.   hydrALAZINE 25 MG tablet Commonly known as: APRESOLINE Take 3 tablets (75 mg total) by mouth every 8 (eight) hours.   isosorbide mononitrate 30 MG 24 hr tablet Commonly known as: IMDUR Take 1 tablet (30 mg total) by mouth daily.   labetalol 200 MG tablet Commonly known as: NORMODYNE Take 1 tablet (200 mg total) by mouth 2 (two) times daily.   oxyCODONE-acetaminophen 5-325 MG tablet Commonly known as: Percocet Take 1 tablet by mouth every 6 (six) hours as needed for severe pain.   sacubitril-valsartan 97-103 MG Commonly known as: ENTRESTO Take 1 tablet by mouth 2 (two) times daily.               Discharge Care Instructions  (From admission, onward)  Start     Ordered   09/18/22 0000  Discharge wound care:       Comments: Home care instructions   09/18/22 0728            Discharge Exam: Filed Weights   09/17/22 0500 09/17/22 1845 09/18/22 0359  Weight: 76.7 kg 77.1 kg 74.6 kg        General:  AAO x 3,  cooperative, no distress;   HEENT:  Normocephalic, PERRL, otherwise with in Normal limits   Neuro:  CNII-XII intact. , normal motor and sensation, reflexes intact   Lungs:   Clear to auscultation BL, Respirations unlabored,  No wheezes / crackles  Cardio:    S1/S2, RRR, No murmure, No Rubs or Gallops   Abdomen:  Soft, non-tender, bowel sounds active all four quadrants, no guarding or peritoneal signs.  Muscular  skeletal:  Limited exam -global  generalized weaknesses - in bed, able to move all 4 extremities,   2+ pulses,  symmetric, No pitting edema  Skin:  Dry, warm to touch, negative for any Rashes,  Wounds: Please see nursing documentation          Condition at discharge: good  The results of significant diagnostics from this hospitalization (including imaging, microbiology, ancillary and laboratory) are listed below for reference.   Imaging Studies: CT Chest High Resolution  Result Date: 09/17/2022 CLINICAL DATA:  Chronic dyspnea, hemoptysis EXAM: CT CHEST WITHOUT CONTRAST TECHNIQUE: Multidetector CT imaging of the chest was performed following the standard protocol without intravenous contrast. High resolution imaging of the lungs, as well as inspiratory and expiratory imaging, was performed. RADIATION DOSE REDUCTION: This exam was performed according to the departmental dose-optimization program which includes automated exposure control, adjustment of the mA and/or kV according to patient size and/or use of iterative reconstruction technique. COMPARISON:  Chest radiographs, 09/16/2022 FINDINGS: Cardiovascular: Aortic atherosclerosis. Right neck large bore multi lumen vascular catheter. Normal heart size. Three-vessel coronary artery calcifications. No pericardial effusion. Mediastinum/Nodes: Numerous prominent mediastinal and hilar lymph nodes. Thyroid gland, trachea, and esophagus demonstrate no significant findings. Lungs/Pleura: Small bilateral pleural effusions. Mild diffuse interlobular septal thickening and ground-glass attenuation of the airspaces. No significant air trapping on expiratory phase imaging. Upper Abdomen: No acute abnormality. Musculoskeletal: No chest wall abnormality. No acute osseous findings. IMPRESSION: 1. Mild diffuse interlobular septal thickening and ground-glass attenuation of the airspaces with small bilateral pleural effusions. Findings are consistent with pulmonary edema. 2. No evidence of fibrotic  interstitial lung disease. Please note that evaluation for interstitial lung disease is significantly limited in the presence of acute airspace disease and pleural effusions, and generally should be deferred to the outpatient setting following the resolution of acute clinical presentation. 3. Numerous prominent mediastinal and hilar lymph nodes, likely reactive. 4. Coronary artery disease. Aortic Atherosclerosis (ICD10-I70.0). Electronically Signed   By: Jearld Lesch M.D.   On: 09/17/2022 14:23   DG Chest Port 1 View  Result Date: 09/16/2022 CLINICAL DATA:  Shortness of breath, CHF, dialysis EXAM: PORTABLE CHEST 1 VIEW COMPARISON:  07/04/2022 FINDINGS: Cardiomegaly. Large-bore right chest multi lumen vascular catheter. Diffuse bilateral interstitial pulmonary opacity and probable small layering pleural effusions. Osseous structures unremarkable. IMPRESSION: 1. Cardiomegaly with diffuse bilateral interstitial pulmonary opacity and probable small layering pleural effusions, consistent with edema. No focal airspace opacity. 2. Large-bore right chest multi lumen vascular catheter. Electronically Signed   By: Jearld Lesch M.D.   On: 09/16/2022 09:03   VAS US DUPLEX DIALYSIS ACCESS (AVF, AVG)  Result  Date: 08/25/2022 DIALYSIS ACCESS Patient Name:  JAYTON POPELKA  Date of Exam:   08/25/2022 Medical Rec #: [id number]        Accession #:    [id number] Date of Birth: 03/18/1956        Patient Gender: M Patient Age:   50 years Exam Location:  [facility] Procedure:      VAS US DUPLEX DIALYSIS ACCESS (AVF, AVG) Referring Phys: TODD EARLY --------------------------------------------------------------------------------  Reason for Exam: Routine follow up. History: 07/13/22: Left first stage BVT. Performing Technologist: Thereasa Parkin RVT  Examination Guidelines: A complete evaluation includes B-mode imaging, spectral Doppler, color Doppler, and power Doppler as needed of all accessible portions of each  vessel. Unilateral testing is considered an integral part of a complete examination. Limited examinations for reoccurring indications may be performed as noted.  Findings: +--------------------+----------+-----------------+--------+ AVF                 PSV (cm/s)Flow Vol (mL/min)Comments +--------------------+----------+-----------------+--------+ Native artery inflow   241           844                +--------------------+----------+-----------------+--------+ AVF Anastomosis        436                              +--------------------+----------+-----------------+--------+  +------------+----------+-------------+----------+--------+ OUTFLOW VEINPSV (cm/s)Diameter (cm)Depth (cm)Describe +------------+----------+-------------+----------+--------+ Prox UA         73        0.88        1.00            +------------+----------+-------------+----------+--------+ Mid UA         147        0.72        0.68            +------------+----------+-------------+----------+--------+ Dist UA        278        0.81        0.57            +------------+----------+-------------+----------+--------+ AC Fossa       299        0.42        0.36            +------------+----------+-------------+----------+--------+ Distal radial artery 68 cm/s and triphasic.   Summary: Patent left BVT. No branching noted. Distal radial artery 68 cm/s and triphasic.  *See table(s) above for measurements and observations.  Diagnosing physician: Gretta Began MD Electronically signed by Gretta Began MD on 08/25/2022 at 4:06:43 PM.    --------------------------------------------------------------------------------   Final     Microbiology: Results for orders placed or performed during the hospital encounter of 09/16/22  MRSA Next Gen by PCR, Nasal     Status: None   Collection Time: 09/16/22  7:30 PM   Specimen: Nasal Mucosa; Nasal Swab  Result Value Ref Range Status   MRSA by PCR Next Gen NOT DETECTED NOT  DETECTED Final    Comment: (NOTE) The GeneXpert MRSA Assay (FDA approved for NASAL specimens only), is one component of a comprehensive MRSA colonization surveillance program. It is not intended to diagnose MRSA infection nor to guide or monitor treatment for MRSA infections. Test performance is not FDA approved in patients less than 73 years old. Performed at [facility], [address] [place], [place] [address]     Labs: CBC: Recent Labs  Lab 09/16/22 [phone] 09/16/22  1053 09/17/22 0505 09/17/22 0756 09/18/22 0441  WBC 20.1*  --  9.4  --  10.0  HGB 8.0* 9.9* 6.7* 6.9* 8.7*  HCT 25.7* 29.0* 21.3* 21.8* 26.8*  MCV 100.4*  --  97.7  --  93.7  PLT 255  --  191  --  204   Basic Metabolic Panel: Recent Labs  Lab 09/16/22 0928 09/16/22 1039 09/16/22 1053 09/17/22 0505 09/18/22 0441  NA 137  137  --  140 136 135  K 3.8  3.7  --  4.5 3.7 3.8  CL 102  104  --  105 100 100  CO2 21*  21*  --   --  28 24  GLUCOSE 217*  210*  --  178* 99 103*  BUN 38*  39*  --  40* 27* 43*  CREATININE 6.53*  6.21*  --  7.60* 5.07* 6.60*  CALCIUM 7.7*  7.3*  --   --  7.5* 7.7*  MG  --  2.2  --   --   --   PHOS 4.6 4.7*  --   --   --    Liver Function Tests: Recent Labs  Lab 09/16/22 0928  ALBUMIN 3.1*   CBG: No results for input(s): "GLUCAP" in the last 168 hours.  Discharge time spent: greater than 40  minutes.  Signed: Kendell Bane, MD [facility] 09/18/2022

## 2022-09-18 NOTE — Progress Notes (Signed)
  HEMODIALYSIS TREATMENT NOTE:  3.5 hour treatment completed.  2.1 liters removed.  He is several kg below his EDW.  Post-HD (standing) weight of 72.5 kg was reported to Dr. Wolfgang Phoenix, who will adjust outpatient HD prescription.  Post-dialysis:  09/18/22 1500  Vital Signs  Temp 98.2 F (36.8 C)  Temp Source Oral  Pulse Rate 88  Pulse Rate Source Monitor  Resp 16  BP (!) 140/79  BP Location Right Arm  BP Method Automatic  Patient Position (if appropriate) Standing  Oxygen Therapy  SpO2 99 %  O2 Device Room Air  Pain Assessment  Pain Scale 0-10  Pain Score 0  Dialysis Weight  Weight 72.5 kg  Type of Weight Post-Dialysis  Estimated Dry Weight 78 kg (per pt report)  Post Treatment  Dialyzer Clearance Lightly streaked  Duration of HD Treatment -hour(s) 3.5 hour(s)  Hemodialysis Intake (mL) 0 mL  Liters Processed 67.5  Fluid Removed (mL) 2100 mL  Tolerated HD Treatment Yes  Post-Hemodialysis Comments Tolerated removal of 2.1 liters with stable BP.  Fistula / Graft Left Forearm Arteriovenous fistula  Placement Date/Time: 07/13/22 1045   Orientation: Left  Access Location: Forearm  Access Type: (c) Arteriovenous fistula  Fistula / Graft Assessment Thrill;Bruit  Status Patent  Education / Care Plan  Dialysis Education Provided Yes  Documented Education in Care Plan Yes  Outpatient Plan of Care Reviewed and on Chart Yes    Arman Filter, RN AP K[facility]

## 2022-09-19 LAB — BPAM RBC: ISSUE DATE / TIME: 202404261903

## 2022-09-19 LAB — TYPE AND SCREEN
Unit division: 0
Unit division: 0

## 2022-09-21 LAB — TYPE AND SCREEN
ABO/RH(D): O POS
Antibody Screen: NEGATIVE

## 2022-09-21 LAB — BPAM RBC: ISSUE DATE / TIME: 202404261903

## 2022-10-06 ENCOUNTER — Ambulatory Visit (INDEPENDENT_AMBULATORY_CARE_PROVIDER_SITE_OTHER): Payer: Medicare Other | Admitting: Vascular Surgery

## 2022-10-06 ENCOUNTER — Encounter: Payer: Self-pay | Admitting: Vascular Surgery

## 2022-10-06 VITALS — BP 169/92 | HR 87 | Temp 97.9°F | Ht 72.0 in | Wt 167.8 lb

## 2022-10-06 DIAGNOSIS — N186 End stage renal disease: Secondary | ICD-10-CM

## 2022-10-06 NOTE — Progress Notes (Signed)
   [facility] [place] [facility]  Patient name: Matthew Robles MRN: [id number] DOB: 17-Nov-1955 Sex: male  REASON FOR VISIT: Follow-up second stage basilic vein transposition left arm  HPI: Matthew Robles is a 67 y.o. male here today for follow-up.  He currently undergoes dialysis via his right IJ catheter at [facility].  He underwent second stage basilic vein transposition by myself on 08/31/2022.  He has had no difficulty with this he has no steal symptoms.  Current Outpatient Medications  Medication Sig Dispense Refill   amLODipine (NORVASC) 10 MG tablet Take 1 tablet (10 mg total) by mouth daily. 30 tablet 1   calcitRIOL (ROCALTROL) 0.5 MCG capsule Take 1 capsule (0.5 mcg total) by mouth daily. 30 capsule 1   FEROSUL 325 (65 Fe) MG tablet Take 325 mg by mouth 2 (two) times daily.     furosemide (LASIX) 80 MG tablet Take 1 tablet (80 mg total) by mouth daily. 30 tablet 1   hydrALAZINE (APRESOLINE) 25 MG tablet Take 3 tablets (75 mg total) by mouth every 8 (eight) hours. 270 tablet 1   isosorbide mononitrate (IMDUR) 30 MG 24 hr tablet Take 1 tablet (30 mg total) by mouth daily. 30 tablet 1   labetalol (NORMODYNE) 200 MG tablet Take 1 tablet (200 mg total) by mouth 2 (two) times daily. 60 tablet 1   lidocaine-prilocaine (EMLA) cream Apply 1 Application topically.     sacubitril-valsartan (ENTRESTO) 97-103 MG Take 1 tablet by mouth 2 (two) times daily. 60 tablet 1   No current facility-administered medications for this visit.     PHYSICAL EXAM: Vitals:   10/06/22 1145  BP: (!) 169/92  Pulse: 87  Temp: 97.9 F (36.6 C)  SpO2: 99%  Weight: 167 lb 12.8 oz (76.1 kg)  Height: 6' (1.829 m)    GENERAL: The patient is a well-nourished male, in no acute distress. The vital signs are documented above. Excellent left upper arm fistula.  Incisions are completely healed.  He has a very large caliber basilic vein.  It runs in a straight  course and is very superficial under the skin.  MEDICAL ISSUES: Approximately 4 weeks out from second stage transposition.  I would wait additional 2 weeks and then beginning his left arm AV fistula.  He can have his catheter removed after several successful sessions   [name], MD FACS [facility] [place] Office Tel [phone]  Note: Portions of this report may have been transcribed using voice recognition software.  Every effort has been made to ensure accuracy; however, inadvertent computerized transcription errors may still be present.

## 2022-11-23 ENCOUNTER — Telehealth (HOSPITAL_COMMUNITY): Payer: Self-pay | Admitting: *Deleted

## 2022-11-23 NOTE — Telephone Encounter (Signed)
Received fax from [facility] requesting CVC removal on a Tuesday or Thursday. Will give to [facility]

## 2022-12-01 ENCOUNTER — Encounter: Payer: Self-pay | Admitting: *Deleted

## 2022-12-01 ENCOUNTER — Other Ambulatory Visit: Payer: Self-pay | Admitting: *Deleted

## 2022-12-13 NOTE — Telephone Encounter (Signed)
Received notification from [facility] that [facility] is unable to remove [facility] scheduled for 7/25 because our providers did not insert catheter.   Spoke with Betsey at [facility] and informed her of the above information. She voiced understanding and will contact patient to get him scheduled elsewhere.

## 2022-12-16 ENCOUNTER — Encounter (HOSPITAL_COMMUNITY): Payer: Medicare Other

## 2023-02-15 ENCOUNTER — Emergency Department (HOSPITAL_COMMUNITY): Payer: Medicare Other

## 2023-02-15 ENCOUNTER — Inpatient Hospital Stay (HOSPITAL_COMMUNITY)
Admission: EM | Admit: 2023-02-15 | Discharge: 2023-02-20 | DRG: 189 | Disposition: A | Discharge status: Home / Self Care | Payer: Medicare Other | Attending: Student | Admitting: Student

## 2023-02-15 ENCOUNTER — Other Ambulatory Visit: Payer: Self-pay

## 2023-02-15 DIAGNOSIS — Z8249 Family history of ischemic heart disease and other diseases of the circulatory system: Secondary | ICD-10-CM

## 2023-02-15 DIAGNOSIS — I161 Hypertensive emergency: Secondary | ICD-10-CM | POA: Diagnosis present

## 2023-02-15 DIAGNOSIS — I160 Hypertensive urgency: Secondary | ICD-10-CM | POA: Diagnosis present

## 2023-02-15 DIAGNOSIS — E871 Hypo-osmolality and hyponatremia: Secondary | ICD-10-CM | POA: Diagnosis present

## 2023-02-15 DIAGNOSIS — J189 Pneumonia, unspecified organism: Secondary | ICD-10-CM | POA: Diagnosis present

## 2023-02-15 DIAGNOSIS — D631 Anemia in chronic kidney disease: Secondary | ICD-10-CM | POA: Diagnosis present

## 2023-02-15 DIAGNOSIS — J810 Acute pulmonary edema: Principal | ICD-10-CM | POA: Diagnosis present

## 2023-02-15 DIAGNOSIS — Z79899 Other long term (current) drug therapy: Secondary | ICD-10-CM | POA: Diagnosis not present

## 2023-02-15 DIAGNOSIS — Z8673 Personal history of transient ischemic attack (TIA), and cerebral infarction without residual deficits: Secondary | ICD-10-CM

## 2023-02-15 DIAGNOSIS — E876 Hypokalemia: Secondary | ICD-10-CM | POA: Diagnosis not present

## 2023-02-15 DIAGNOSIS — J9621 Acute and chronic respiratory failure with hypoxia: Secondary | ICD-10-CM | POA: Diagnosis not present

## 2023-02-15 DIAGNOSIS — I120 Hypertensive chronic kidney disease with stage 5 chronic kidney disease or end stage renal disease: Secondary | ICD-10-CM | POA: Diagnosis present

## 2023-02-15 DIAGNOSIS — Z87891 Personal history of nicotine dependence: Secondary | ICD-10-CM | POA: Diagnosis not present

## 2023-02-15 DIAGNOSIS — E1122 Type 2 diabetes mellitus with diabetic chronic kidney disease: Secondary | ICD-10-CM | POA: Diagnosis present

## 2023-02-15 DIAGNOSIS — D638 Anemia in other chronic diseases classified elsewhere: Secondary | ICD-10-CM | POA: Diagnosis not present

## 2023-02-15 DIAGNOSIS — R000 Tachycardia, unspecified: Secondary | ICD-10-CM | POA: Diagnosis present

## 2023-02-15 DIAGNOSIS — E1165 Type 2 diabetes mellitus with hyperglycemia: Secondary | ICD-10-CM | POA: Diagnosis present

## 2023-02-15 DIAGNOSIS — Z833 Family history of diabetes mellitus: Secondary | ICD-10-CM

## 2023-02-15 DIAGNOSIS — R0603 Acute respiratory distress: Principal | ICD-10-CM

## 2023-02-15 DIAGNOSIS — M898X9 Other specified disorders of bone, unspecified site: Secondary | ICD-10-CM | POA: Diagnosis present

## 2023-02-15 DIAGNOSIS — Z1152 Encounter for screening for COVID-19: Secondary | ICD-10-CM

## 2023-02-15 DIAGNOSIS — I259 Chronic ischemic heart disease, unspecified: Secondary | ICD-10-CM | POA: Diagnosis present

## 2023-02-15 DIAGNOSIS — R739 Hyperglycemia, unspecified: Secondary | ICD-10-CM

## 2023-02-15 DIAGNOSIS — Z992 Dependence on renal dialysis: Secondary | ICD-10-CM | POA: Diagnosis not present

## 2023-02-15 DIAGNOSIS — Z23 Encounter for immunization: Secondary | ICD-10-CM | POA: Diagnosis not present

## 2023-02-15 DIAGNOSIS — N186 End stage renal disease: Secondary | ICD-10-CM | POA: Diagnosis present

## 2023-02-15 DIAGNOSIS — J9600 Acute respiratory failure, unspecified whether with hypoxia or hypercapnia: Secondary | ICD-10-CM | POA: Diagnosis present

## 2023-02-15 DIAGNOSIS — J9601 Acute respiratory failure with hypoxia: Secondary | ICD-10-CM

## 2023-02-15 DIAGNOSIS — Z91148 Patient's other noncompliance with medication regimen for other reason: Secondary | ICD-10-CM

## 2023-02-15 LAB — CBC WITH DIFFERENTIAL/PLATELET
Abs Immature Granulocytes: 0.03 10*3/uL (ref 0.00–0.07)
Basophils Absolute: 0.1 10*3/uL (ref 0.0–0.1)
Basophils Relative: 1 %
Eosinophils Absolute: 1.4 10*3/uL — ABNORMAL HIGH (ref 0.0–0.5)
Eosinophils Relative: 11 %
HCT: 27.2 % — ABNORMAL LOW (ref 39.0–52.0)
Hemoglobin: 8.5 g/dL — ABNORMAL LOW (ref 13.0–17.0)
Immature Granulocytes: 0 %
Lymphocytes Relative: 5 %
Lymphs Abs: 0.6 10*3/uL — ABNORMAL LOW (ref 0.7–4.0)
MCH: 31.8 pg (ref 26.0–34.0)
MCHC: 31.3 g/dL (ref 30.0–36.0)
MCV: 101.9 fL — ABNORMAL HIGH (ref 80.0–100.0)
Monocytes Absolute: 0.8 10*3/uL (ref 0.1–1.0)
Monocytes Relative: 6 %
Neutro Abs: 9.6 10*3/uL — ABNORMAL HIGH (ref 1.7–7.7)
Neutrophils Relative %: 77 %
Platelets: 261 10*3/uL (ref 150–400)
RBC: 2.67 MIL/uL — ABNORMAL LOW (ref 4.22–5.81)
RDW: 16.7 % — ABNORMAL HIGH (ref 11.5–15.5)
WBC: 12.5 10*3/uL — ABNORMAL HIGH (ref 4.0–10.5)
nRBC: 0 % (ref 0.0–0.2)

## 2023-02-15 LAB — BLOOD GAS, VENOUS
Acid-Base Excess: 2.2 mmol/L — ABNORMAL HIGH (ref 0.0–2.0)
Bicarbonate: 27.3 mmol/L (ref 20.0–28.0)
Drawn by: 6714
FIO2: 100 %
O2 Saturation: 96.8 %
Patient temperature: 37.1
pCO2, Ven: 44 mmHg (ref 44–60)
pH, Ven: 7.4 (ref 7.25–7.43)
pO2, Ven: 77 mmHg — ABNORMAL HIGH (ref 32–45)

## 2023-02-15 LAB — COMPREHENSIVE METABOLIC PANEL
ALT: 31 U/L (ref 0–44)
AST: 29 U/L (ref 15–41)
Albumin: 3.3 g/dL — ABNORMAL LOW (ref 3.5–5.0)
Alkaline Phosphatase: 93 U/L (ref 38–126)
Anion gap: 15 (ref 5–15)
BUN: 45 mg/dL — ABNORMAL HIGH (ref 8–23)
CO2: 24 mmol/L (ref 22–32)
Calcium: 8 mg/dL — ABNORMAL LOW (ref 8.9–10.3)
Chloride: 92 mmol/L — ABNORMAL LOW (ref 98–111)
Creatinine, Ser: 7.44 mg/dL — ABNORMAL HIGH (ref 0.61–1.24)
GFR, Estimated: 7 mL/min — ABNORMAL LOW (ref >60)
Glucose, Bld: 378 mg/dL — ABNORMAL HIGH (ref 70–99)
Potassium: 4.6 mmol/L (ref 3.5–5.1)
Sodium: 131 mmol/L — ABNORMAL LOW (ref 135–145)
Total Bilirubin: 1.9 mg/dL — ABNORMAL HIGH (ref 0.3–1.2)
Total Protein: 7.1 g/dL (ref 6.5–8.1)

## 2023-02-15 LAB — RESP PANEL BY RT-PCR (RSV, FLU A&B, COVID)  RVPGX2
Influenza A by PCR: NEGATIVE
Influenza B by PCR: NEGATIVE
Resp Syncytial Virus by PCR: NEGATIVE
SARS Coronavirus 2 by RT PCR: NEGATIVE

## 2023-02-15 LAB — I-STAT CHEM 8, ED
BUN: 41 mg/dL — ABNORMAL HIGH (ref 8–23)
Calcium, Ion: 0.94 mmol/L — ABNORMAL LOW (ref 1.15–1.40)
Chloride: 94 mmol/L — ABNORMAL LOW (ref 98–111)
Creatinine, Ser: 8.3 mg/dL — ABNORMAL HIGH (ref 0.61–1.24)
Glucose, Bld: 381 mg/dL — ABNORMAL HIGH (ref 70–99)
HCT: 30 % — ABNORMAL LOW (ref 39.0–52.0)
Hemoglobin: 10.2 g/dL — ABNORMAL LOW (ref 13.0–17.0)
Potassium: 4.7 mmol/L (ref 3.5–5.1)
Sodium: 133 mmol/L — ABNORMAL LOW (ref 135–145)
TCO2: 22 mmol/L (ref 22–32)

## 2023-02-15 LAB — BRAIN NATRIURETIC PEPTIDE: B Natriuretic Peptide: >4500 pg/mL — ABNORMAL HIGH (ref 0.0–100.0)

## 2023-02-15 LAB — MAGNESIUM: Magnesium: 2 mg/dL (ref 1.7–2.4)

## 2023-02-15 LAB — TROPONIN I (HIGH SENSITIVITY)
Troponin I (High Sensitivity): 55 ng/L — ABNORMAL HIGH (ref <18)
Troponin I (High Sensitivity): 120 ng/L (ref <18)

## 2023-02-15 LAB — CBG MONITORING, ED: Glucose-Capillary: 117 mg/dL — ABNORMAL HIGH (ref 70–99)

## 2023-02-15 MED ORDER — POLYETHYLENE GLYCOL 3350 17 G PO PACK: 17.0000 g | PACK | Freq: Every day | ORAL | Status: DC | PRN

## 2023-02-15 MED ORDER — HEPARIN SODIUM (PORCINE) 5000 UNIT/ML IJ SOLN
5000.0000 [IU] | Freq: Three times a day (TID) | INTRAMUSCULAR | Status: DC
  Administered 2023-02-15 – 2023-02-20 (×11): 5000 [IU] via SUBCUTANEOUS
  Filled 2023-02-15 (×12): qty 1

## 2023-02-15 MED ORDER — LABETALOL HCL 5 MG/ML IV SOLN
20.0000 mg | INTRAVENOUS | Status: DC | PRN
  Administered 2023-02-15 – 2023-02-19 (×4): 20 mg via INTRAVENOUS
  Filled 2023-02-15 (×4): qty 4

## 2023-02-15 MED ORDER — INSULIN ASPART 100 UNIT/ML IJ SOLN: 0.0000 [IU] | Freq: Three times a day (TID) | INTRAMUSCULAR | Status: DC

## 2023-02-15 MED ORDER — INSULIN ASPART 100 UNIT/ML IJ SOLN
10.0000 [IU] | Freq: Once | INTRAMUSCULAR | Status: AC
  Administered 2023-02-15: 10 [IU] via SUBCUTANEOUS
  Filled 2023-02-15: qty 1

## 2023-02-15 MED ORDER — HYDRALAZINE HCL 20 MG/ML IJ SOLN
10.0000 mg | INTRAMUSCULAR | Status: DC | PRN
  Administered 2023-02-16 (×2): 10 mg via INTRAVENOUS
  Filled 2023-02-15 (×2): qty 1

## 2023-02-15 MED ORDER — MELATONIN 3 MG PO TABS
3.0000 mg | ORAL_TABLET | Freq: Every evening | ORAL | Status: DC | PRN
  Administered 2023-02-16: 3 mg via ORAL
  Filled 2023-02-15: qty 1

## 2023-02-15 MED ORDER — ACETAMINOPHEN 650 MG RE SUPP: 650.0000 mg | Freq: Four times a day (QID) | RECTAL | Status: DC | PRN

## 2023-02-15 MED ORDER — INSULIN ASPART 100 UNIT/ML IJ SOLN: 0.0000 [IU] | Freq: Every day | INTRAMUSCULAR | Status: DC

## 2023-02-15 MED ORDER — INSULIN ASPART 100 UNIT/ML IJ SOLN
0.0000 [IU] | Freq: Three times a day (TID) | INTRAMUSCULAR | Status: DC
  Administered 2023-02-16 (×2): 2 [IU] via SUBCUTANEOUS
  Administered 2023-02-17: 3 [IU] via SUBCUTANEOUS
  Administered 2023-02-18: 1 [IU] via SUBCUTANEOUS

## 2023-02-15 MED ORDER — PNEUMOCOCCAL 20-VAL CONJ VACC 0.5 ML IM SUSY: 0.5000 mL | PREFILLED_SYRINGE | INTRAMUSCULAR | Status: DC

## 2023-02-15 MED ORDER — CHLORHEXIDINE GLUCONATE CLOTH 2 % EX PADS
6.0000 | MEDICATED_PAD | Freq: Every day | CUTANEOUS | Status: DC
  Administered 2023-02-17 – 2023-02-20 (×4): 6 via TOPICAL

## 2023-02-15 MED ORDER — SODIUM CHLORIDE 0.9% FLUSH
3.0000 mL | Freq: Two times a day (BID) | INTRAVENOUS | Status: DC
  Administered 2023-02-15 – 2023-02-20 (×8): 3 mL via INTRAVENOUS

## 2023-02-15 MED ORDER — NITROGLYCERIN IN D5W 200-5 MCG/ML-% IV SOLN
0.0000 ug/min | INTRAVENOUS | Status: DC
  Administered 2023-02-15 – 2023-02-16 (×2): 100 ug/min via INTRAVENOUS
  Administered 2023-02-16: 325 ug/min via INTRAVENOUS
  Administered 2023-02-16: 225 ug/min via INTRAVENOUS
  Administered 2023-02-16: 175 ug/min via INTRAVENOUS
  Administered 2023-02-16: 300 ug/min via INTRAVENOUS
  Administered 2023-02-17: 275 ug/min via INTRAVENOUS
  Administered 2023-02-17: 200 ug/min via INTRAVENOUS
  Administered 2023-02-17: 175 ug/min via INTRAVENOUS
  Administered 2023-02-17: 50 ug/min via INTRAVENOUS
  Administered 2023-02-18: 100 ug/min via INTRAVENOUS
  Filled 2023-02-15 (×10): qty 250

## 2023-02-15 MED ORDER — FUROSEMIDE 40 MG PO TABS
80.0000 mg | ORAL_TABLET | Freq: Every day | ORAL | Status: DC
  Administered 2023-02-16 – 2023-02-20 (×5): 80 mg via ORAL
  Filled 2023-02-15 (×5): qty 2

## 2023-02-15 MED ORDER — CHLORHEXIDINE GLUCONATE CLOTH 2 % EX PADS
6.0000 | MEDICATED_PAD | Freq: Every day | CUTANEOUS | Status: DC
  Administered 2023-02-15: 6 via TOPICAL

## 2023-02-15 MED ORDER — SODIUM CHLORIDE 0.9 % IV SOLN
1.0000 g | INTRAVENOUS | Status: AC
  Administered 2023-02-15 – 2023-02-19 (×5): 1 g via INTRAVENOUS
  Filled 2023-02-15 (×5): qty 10

## 2023-02-15 MED ORDER — SODIUM CHLORIDE 0.9 % IV SOLN
500.0000 mg | INTRAVENOUS | Status: AC
  Administered 2023-02-15 – 2023-02-17 (×3): 500 mg via INTRAVENOUS
  Filled 2023-02-15 (×3): qty 5

## 2023-02-15 MED ORDER — INFLUENZA VAC A&B SURF ANT ADJ 0.5 ML IM SUSY: 0.5000 mL | PREFILLED_SYRINGE | INTRAMUSCULAR | Status: DC

## 2023-02-15 MED ORDER — AMLODIPINE BESYLATE 5 MG PO TABS
10.0000 mg | ORAL_TABLET | Freq: Every day | ORAL | Status: DC
  Administered 2023-02-16 – 2023-02-20 (×5): 10 mg via ORAL
  Filled 2023-02-15 (×5): qty 2

## 2023-02-15 MED ORDER — ACETAMINOPHEN 325 MG PO TABS: 650.0000 mg | ORAL_TABLET | Freq: Four times a day (QID) | ORAL | Status: DC | PRN

## 2023-02-15 MED ORDER — CALCITRIOL 0.25 MCG PO CAPS
0.5000 ug | ORAL_CAPSULE | Freq: Every day | ORAL | Status: DC
  Administered 2023-02-16 – 2023-02-20 (×5): 0.5 ug via ORAL
  Filled 2023-02-15 (×5): qty 2

## 2023-02-15 MED ORDER — HYDRALAZINE HCL 25 MG PO TABS
75.0000 mg | ORAL_TABLET | Freq: Three times a day (TID) | ORAL | Status: DC
  Administered 2023-02-15 – 2023-02-18 (×7): 75 mg via ORAL
  Filled 2023-02-15 (×8): qty 3

## 2023-02-15 NOTE — ED Notes (Signed)
Pt states he does not want to be admitted to the hospital. EDP made aware

## 2023-02-15 NOTE — ED Provider Notes (Signed)
[place] EMERGENCY DEPARTMENT AT [facility] Provider Note   CSN: [id number] Arrival date & time: 02/15/23  4098     History  Chief Complaint  Patient presents with   Respiratory Distress    Matthew Robles is a 67 y.o. male.  HPI Patient presents for respiratory distress.  Medical history includes DM, CHF, ESRD, CVA, HTN.  Initial history is provided by EMS.  Patient has reportedly had worsening shortness of breath over the past 2 to 3 days.  He states that he did undergo dialysis 2 days ago.  Shortness of breath worsened today prompting call to EMS.  When EMS on the scene, patient had SpO2 of 85% on 6 L by nasal cannula.  He had increased work of breathing and tachypnea.  He was placed on CPAP.  SpO2 improved to 95%.  Vital signs were otherwise notable for blood pressure in the range of 240s SBP.    Home Medications Prior to Admission medications   Medication Sig Start Date End Date Taking? Authorizing Provider  amLODipine (NORVASC) 10 MG tablet Take 1 tablet (10 mg total) by mouth daily. 09/18/22   Shahmehdi, Gemma Payor, MD  calcitRIOL (ROCALTROL) 0.5 MCG capsule Take 1 capsule (0.5 mcg total) by mouth daily. 07/05/22   Catarina Hartshorn, MD  FEROSUL 325 (65 Fe) MG tablet Take 325 mg by mouth 2 (two) times daily. 08/18/22   [provider]  furosemide (LASIX) 80 MG tablet Take 1 tablet (80 mg total) by mouth daily. 09/18/22   Shahmehdi, Gemma Payor, MD  hydrALAZINE (APRESOLINE) 25 MG tablet Take 3 tablets (75 mg total) by mouth every 8 (eight) hours. 09/18/22   Shahmehdi, Gemma Payor, MD  isosorbide mononitrate (IMDUR) 30 MG 24 hr tablet Take 1 tablet (30 mg total) by mouth daily. 07/05/22   Catarina Hartshorn, MD  labetalol (NORMODYNE) 200 MG tablet Take 1 tablet (200 mg total) by mouth 2 (two) times daily. 09/18/22 11/17/22  Kendell Bane, MD  lidocaine-prilocaine (EMLA) cream Apply 1 Application topically. 09/21/22   [provider]      Allergies    Patient has no known  allergies.    Review of Systems   Review of Systems  Unable to perform ROS: Severe respiratory distress    Physical Exam Updated Vital Signs BP (!) 192/97   Pulse 91   Temp 98 F (36.7 C) (Oral)   Resp 19   Ht 6' (1.829 m)   Wt 84.3 kg   SpO2 96%   BMI 25.21 kg/m  Physical Exam Constitutional:      General: He is in acute distress.     Appearance: He is normal weight. He is ill-appearing. He is not diaphoretic.  HENT:     Head: Normocephalic and atraumatic.     Right Ear: External ear normal.     Left Ear: External ear normal.     Nose: Nose normal.     Mouth/Throat:     Mouth: Mucous membranes are moist.  Eyes:     Extraocular Movements: Extraocular movements intact.  Cardiovascular:     Rate and Rhythm: Regular rhythm. Tachycardia present.     Heart sounds: No murmur heard. Pulmonary:     Effort: Tachypnea, accessory muscle usage and respiratory distress present.     Breath sounds: Rhonchi and rales present.  Abdominal:     Palpations: Abdomen is soft.     Tenderness: There is no abdominal tenderness.  Musculoskeletal:  General: No deformity.     Cervical back: Neck supple.     Right lower leg: Edema present.     Left lower leg: Edema present.  Skin:    General: Skin is warm and dry.  Neurological:     General: No focal deficit present.     Mental Status: He is alert and oriented to person, place, and time.  Psychiatric:        Behavior: Behavior normal.     ED Results / Procedures / Treatments   Labs (all labs ordered are listed, but only abnormal results are displayed) Labs Reviewed  COMPREHENSIVE METABOLIC PANEL - Abnormal; Notable for the following components:      Result Value   Sodium 131 (*)    Chloride 92 (*)    Glucose, Bld 378 (*)    BUN 45 (*)    Creatinine, Ser 7.44 (*)    Calcium 8.0 (*)    Albumin 3.3 (*)    Total Bilirubin 1.9 (*)    GFR, Estimated 7 (*)    All other components within normal limits  BRAIN NATRIURETIC  PEPTIDE - Abnormal; Notable for the following components:   B Natriuretic Peptide >4,500.0 (*)    All other components within normal limits  CBC WITH DIFFERENTIAL/PLATELET - Abnormal; Notable for the following components:   WBC 12.5 (*)    RBC 2.67 (*)    Hemoglobin 8.5 (*)    HCT 27.2 (*)    MCV 101.9 (*)    RDW 16.7 (*)    Neutro Abs 9.6 (*)    Lymphs Abs 0.6 (*)    Eosinophils Absolute 1.4 (*)    All other components within normal limits  BLOOD GAS, VENOUS - Abnormal; Notable for the following components:   pO2, Ven 77 (*)    Acid-Base Excess 2.2 (*)    All other components within normal limits  I-STAT CHEM 8, ED - Abnormal; Notable for the following components:   Sodium 133 (*)    Chloride 94 (*)    BUN 41 (*)    Creatinine, Ser 8.30 (*)    Glucose, Bld 381 (*)    Calcium, Ion 0.94 (*)    Hemoglobin 10.2 (*)    HCT 30.0 (*)    All other components within normal limits  CBG MONITORING, ED - Abnormal; Notable for the following components:   Glucose-Capillary 117 (*)    All other components within normal limits  TROPONIN I (HIGH SENSITIVITY) - Abnormal; Notable for the following components:   Troponin I (High Sensitivity) 55 (*)    All other components within normal limits  TROPONIN I (HIGH SENSITIVITY) - Abnormal; Notable for the following components:   Troponin I (High Sensitivity) 120 (*)    All other components within normal limits  RESP PANEL BY RT-PCR (RSV, FLU A&B, COVID)  RVPGX2  CULTURE, BLOOD (ROUTINE X 2)  CULTURE, BLOOD (ROUTINE X 2)  MRSA NEXT GEN BY PCR, NASAL  MAGNESIUM  HEMOGLOBIN A1C  VITAMIN B12  FOLATE  IRON AND TIBC  FERRITIN  RETICULOCYTES  APTT  PROTIME-INR  BASIC METABOLIC PANEL  CBC  PHOSPHORUS  HEPATITIS B SURFACE ANTIGEN  HEPATITIS B SURFACE ANTIBODY, QUANTITATIVE    EKG None  Radiology DG Chest Port 1 View  Result Date: 02/15/2023 CLINICAL DATA:  dyspnea EXAM: PORTABLE CHEST 1 VIEW COMPARISON:  Chest x-ray 09/16/2022, CT chest  09/17/2022 FINDINGS: Interval removal of a right chest wall dialysis catheter. The heart and mediastinal contours  are unchanged. Atherosclerotic plaque. Increased fluffy interstitial markings with Kerley B lines. A. No pleural effusion. No pneumothorax. No acute osseous abnormality. IMPRESSION: 1. Pulmonary edema. 2.  Aortic Atherosclerosis (ICD10-I70.0). Electronically Signed   By: Tish Frederickson M.D.   On: 02/15/2023 20:16    Procedures Procedures    Medications Ordered in ED Medications  nitroGLYCERIN 50 mg in dextrose 5 % 250 mL (0.2 mg/mL) infusion (150 mcg/min Intravenous Infusion Verify 02/15/23 2329)  labetalol (NORMODYNE) injection 20 mg (20 mg Intravenous Given 02/15/23 2211)  hydrALAZINE (APRESOLINE) injection 10 mg (has no administration in time range)  insulin aspart (novoLOG) injection 0-5 Units ( Subcutaneous Not Given 02/15/23 2210)  insulin aspart (novoLOG) injection 0-9 Units (has no administration in time range)  cefTRIAXone (ROCEPHIN) 1 g in sodium chloride 0.9 % 100 mL IVPB (1 g Intravenous New Bag/Given 02/15/23 2208)  azithromycin (ZITHROMAX) 500 mg in sodium chloride 0.9 % 250 mL IVPB (500 mg Intravenous New Bag/Given 02/15/23 2312)  hydrALAZINE (APRESOLINE) tablet 75 mg (75 mg Oral Given 02/15/23 2215)  amLODipine (NORVASC) tablet 10 mg (has no administration in time range)  furosemide (LASIX) tablet 80 mg (has no administration in time range)  calcitRIOL (ROCALTROL) capsule 0.5 mcg (has no administration in time range)  acetaminophen (TYLENOL) tablet 650 mg (has no administration in time range)    Or  acetaminophen (TYLENOL) suppository 650 mg (has no administration in time range)  polyethylene glycol (MIRALAX / GLYCOLAX) packet 17 g (has no administration in time range)  heparin injection 5,000 Units (5,000 Units Subcutaneous Given 02/15/23 2213)  sodium chloride flush (NS) 0.9 % injection 3 mL (3 mLs Intravenous Given 02/15/23 2216)  pneumococcal 20-valent conjugate  vaccine (PREVNAR 20) injection 0.5 mL (has no administration in time range)  influenza vaccine adjuvanted (FLUAD) injection 0.5 mL (has no administration in time range)  Chlorhexidine Gluconate Cloth 2 % PADS 6 each (has no administration in time range)  melatonin tablet 3 mg (has no administration in time range)  insulin aspart (novoLOG) injection 10 Units (10 Units Subcutaneous Given 02/15/23 2047)    ED Course/ Medical Decision Making/ A&P                                 Medical Decision Making Amount and/or Complexity of Data Reviewed Labs: ordered. Radiology: ordered.  Risk Prescription drug management. Decision regarding hospitalization.   This patient presents to the ED for concern of shortness of breath, this involves an extensive number of treatment options, and is a complaint that carries with it a high risk of complications and morbidity.  The differential diagnosis includes hypertensive crisis, pulmonary edema, pneumonia, reactive airway disease exacerbation, CHF exacerbation   Co morbidities that complicate the patient evaluation  DM, CHF, ESRD, CVA, HTN   Additional history obtained:  Additional history obtained from patient's mother External records from outside source obtained and reviewed including EMR   Lab Tests:  I Ordered, and personally interpreted labs.  The pertinent results include: BNP is markedly elevated.  Anemia is baseline.  A leukocytosis is present.  Creatinine is elevated consistent with ESRD.  Glucose is elevated without evidence of DKA.  Troponin is mildly elevated.   Imaging Studies ordered:  I ordered imaging studies including chest x-ray I independently visualized and interpreted imaging which showed pulmonary edema I agree with the radiologist interpretation   Cardiac Monitoring: / EKG:  The patient was maintained on a cardiac  monitor.  I personally viewed and interpreted the cardiac monitored which showed an underlying rhythm of:  Sinus rhythm   Consultations Obtained:  I requested consultation with the nephrologist,  and discussed lab and imaging findings as well as pertinent plan - they recommend: Patient to be scheduled for dialysis tomorrow.  He cannot state any pain.   Problem List / ED Course / Critical interventions / Medication management  Patient presenting for respiratory distress.  EMS noted hypoxia despite supplemental oxygen on scene, increased work of breathing, and severe hypertension.  On arrival, patient remains in respiratory distress, despite CPAP.  He was placed on bedside cardiac monitor.  Initial vital signs are notable for tachycardia in the range of 130.  SpO2 is in the low 80s on CPAP.  He was placed on BiPAP with increased FiO2.  SpO2 improved as did his work of breathing.  Bedside ultrasound shows biapical B-lines.  Blood pressure now in the range of 220 SBP.  NTG gtt. was ordered.  Workup was initiated.  Patient had some pink frothy sputum.  I suspect this is secondary to pulmonary edema.  On further reassessment, patient is remarkably improved.  100 mcg/min of NTG, blood pressure is in the 170s SBP.  Heart rate has normalized.  He is now breathing comfortably but remains on BiPAP.  He is able to speak in complete sentences.  He states that he had onset of cough last night.  He states that he undergoes dialysis M, W, F.  He does say that he went yesterday.  He also states that he has been cutting back on his blood pressure medications because he feels that they upset his stomach.  Lab work is notable for markedly elevated BNP, mildly elevated troponin, elevated creatinine consistent with ESRD, hyperglycemia without evidence of DKA.  Anemia is baseline.  A leukocytosis is present.  Patient was given correction dose of insulin.  On further reassessment, patient's symptoms are so improved that he states that he wants to go home.  At this time, he remained on BiPAP and was on a nitro drip.  After discussion,  he was agreeable to stay.  I spoke with nephrology, who will schedule him for dialysis tomorrow.  Patient was admitted for further management. I ordered medication including NTG for hypertensive crisis; insulin for hyperglycemia Reevaluation of the patient after these medicines showed that the patient improved I have reviewed the patients home medicines and have made adjustments as needed   Social Determinants of Health:  Has access to outpatient care  CRITICAL CARE Performed by: Gloris Manchester   Total critical care time: 35 minutes  Critical care time was exclusive of separately billable procedures and treating other patients.  Critical care was necessary to treat or prevent imminent or life-threatening deterioration.  Critical care was time spent personally by me on the following activities: development of treatment plan with patient and/or surrogate as well as nursing, discussions with consultants, evaluation of patient's response to treatment, examination of patient, obtaining history from patient or surrogate, ordering and performing treatments and interventions, ordering and review of laboratory studies, ordering and review of radiographic studies, pulse oximetry and re-evaluation of patient's condition.         Final Clinical Impression(s) / ED Diagnoses Final diagnoses:  Respiratory distress  Acute pulmonary edema (HCC)  Acute respiratory failure with hypoxia [facility])  Hypertensive emergency    Rx / DC Orders ED Discharge Orders     None  Gloris Manchester, MD 02/15/23 [phone]

## 2023-02-15 NOTE — H&P (Signed)
History and Physical    Patient: Matthew Robles ION:[id number] DOB: [birth year] DOA: 02/15/2023 DOS: the patient was seen and examined on 02/15/2023 PCP: [facility]  Patient coming from: Home  Chief Complaint:  Chief Complaint  Patient presents with   Respiratory Distress   HPI: Matthew Robles is a 67 y.o. male with medical history significant of end-stage renal disease as well as hypertension.  Patient feels that the combination of labetalol and amlodipine causes him to have stomach upset.  Therefore patient occasionally takes a break from his antihypertensive regimens.  Patient is currently on a break.  Patient reports having sinus congestion today.  But also developed marked sensation of shortness of breath that was present even at rest worse with exertion.  Prompting the patient to come to the ER.  There is no report of fever no report of chest pain.  Patient has been coughing due to his nasal congestion which is pretty dry.  There is no leg swelling.  In the ER patient was noted to be in moderate respiratory distress, even on 6 L/min he was initially hypoxic for EMS, therefore K arrived on noninvasive ventilator on which he is being currently maintained.  Patient has been found to be in severe hypertension.  Started on nitroglycerin infusion.  Patient is now feeling remarkably better and wanted to go home.   mom at the bedside Review of Systems: As mentioned in the history of present illness. All other systems reviewed and are negative. Past Medical History:  Diagnosis Date   CHF (congestive heart failure) (HCC)    Chronic kidney disease    Diabetes mellitus without complication (HCC)    Hypertension    Stroke (HCC)    2017   Past Surgical History:  Procedure Laterality Date   AV FISTULA PLACEMENT Left 07/13/2022   Procedure: LEFT ARM ARTERIOVENOUS (AV) FISTULA CREATION;  Surgeon: [name], MD;  Location: [facility];  Service: Vascular;  Laterality:  Left;   BASCILIC VEIN TRANSPOSITION Left 08/31/2022   Procedure: LEFT ARM SECOND STAGE BASILIC VEIN TRANSPOSITION;  Surgeon: [name], MD;  Location: [facility];  Service: Vascular;  Laterality: Left;   INSERTION OF DIALYSIS CATHETER Right 06/25/2022   Procedure: INSERTION OF DIALYSIS CATHETER;  Surgeon: Lucretia Roers, MD;  Location: [facility];  Service: General;  Laterality: Right;   WISDOM TOOTH EXTRACTION     Social History:  reports that he quit smoking about 5 years ago. His smoking use included cigarettes. He has never used smokeless tobacco. He reports that he does not currently use alcohol after a past usage of about 4.0 standard drinks of alcohol per week. He reports that he does not currently use drugs after having used the following drugs: Marijuana.  No Known Allergies  Family History  Problem Relation Age of Onset   Diabetes Mother    Hypertension Mother    Diabetes Maternal Grandfather     Prior to Admission medications   Medication Sig Start Date End Date Taking? Authorizing Provider  amLODipine (NORVASC) 10 MG tablet Take 1 tablet (10 mg total) by mouth daily. 09/18/22   Shahmehdi, Gemma Payor, MD  calcitRIOL (ROCALTROL) 0.5 MCG capsule Take 1 capsule (0.5 mcg total) by mouth daily. 07/05/22   Catarina Hartshorn, MD  FEROSUL 325 (65 Fe) MG tablet Take 325 mg by mouth 2 (two) times daily. 08/18/22   [provider]  furosemide (LASIX) 80 MG tablet Take 1 tablet (80 mg total) by mouth daily.  09/18/22   Shahmehdi, Gemma Payor, MD  hydrALAZINE (APRESOLINE) 25 MG tablet Take 3 tablets (75 mg total) by mouth every 8 (eight) hours. 09/18/22   Shahmehdi, Gemma Payor, MD  isosorbide mononitrate (IMDUR) 30 MG 24 hr tablet Take 1 tablet (30 mg total) by mouth daily. 07/05/22   Catarina Hartshorn, MD  labetalol (NORMODYNE) 200 MG tablet Take 1 tablet (200 mg total) by mouth 2 (two) times daily. 09/18/22 11/17/22  Kendell Bane, MD  lidocaine-prilocaine (EMLA) cream Apply 1 Application topically. 09/21/22    [provider]    Physical Exam: Vitals:   02/15/23 1945 02/15/23 1948 02/15/23 2030 02/15/23 2045  BP: (!) 175/100 (!) 181/106 (!) 180/98 (!) 181/93  Pulse: 98 (!) 101 96 92  Resp: (!) 23 (!) 26 (!) 23 18  Temp:      TempSrc:      SpO2: 100%  100% 100%   general: Alert and awake does not appear to be in any distress..  Patient does seem to be fairly intense and his speech and blood pressure during my encountering him was 220 systolic. Resp  exam: Bilateral air entry vesicular with bilateral posterior basilar crackles.  No marked expiratory wheezes Cardiovascular exam S1-S2 normal Abdomen all quadrants soft nontender well-built gentleman Extremities warm without edema Data Reviewed:  Labs on Admission:  Results for orders placed or performed during the hospital encounter of 02/15/23 (from the past 24 hour(s))  Resp panel by RT-PCR (RSV, Flu A&B, Covid) Anterior Nasal Swab     Status: None   Collection Time: 02/15/23  7:35 PM   Specimen: Anterior Nasal Swab  Result Value Ref Range   SARS Coronavirus 2 by RT PCR NEGATIVE NEGATIVE   Influenza A by PCR NEGATIVE NEGATIVE   Influenza B by PCR NEGATIVE NEGATIVE   Resp Syncytial Virus by PCR NEGATIVE NEGATIVE  Comprehensive metabolic panel     Status: Abnormal   Collection Time: 02/15/23  7:35 PM  Result Value Ref Range   Sodium 131 (L) 135 - 145 mmol/L   Potassium 4.6 3.5 - 5.1 mmol/L   Chloride 92 (L) 98 - 111 mmol/L   CO2 24 22 - 32 mmol/L   Glucose, Bld 378 (H) 70 - 99 mg/dL   BUN 45 (H) 8 - 23 mg/dL   Creatinine, Ser 1.61 (H) 0.61 - 1.24 mg/dL   Calcium 8.0 (L) 8.9 - 10.3 mg/dL   Total Protein 7.1 6.5 - 8.1 g/dL   Albumin 3.3 (L) 3.5 - 5.0 g/dL   AST 29 15 - 41 U/L   ALT 31 0 - 44 U/L   Alkaline Phosphatase 93 38 - 126 U/L   Total Bilirubin 1.9 (H) 0.3 - 1.2 mg/dL   GFR, Estimated 7 (L) >60 mL/min   Anion gap 15 5 - 15  Brain natriuretic peptide     Status: Abnormal   Collection Time: 02/15/23  7:35 PM   Result Value Ref Range   B Natriuretic Peptide >4,500.0 (H) 0.0 - 100.0 pg/mL  Magnesium     Status: None   Collection Time: 02/15/23  7:35 PM  Result Value Ref Range   Magnesium 2.0 1.7 - 2.4 mg/dL  CBC with Differential/Platelet     Status: Abnormal   Collection Time: 02/15/23  7:35 PM  Result Value Ref Range   WBC 12.5 (H) 4.0 - 10.5 K/uL   RBC 2.67 (L) 4.22 - 5.81 MIL/uL   Hemoglobin 8.5 (L) 13.0 - 17.0 g/dL   HCT  27.2 (L) 39.0 - 52.0 %   MCV 101.9 (H) 80.0 - 100.0 fL   MCH 31.8 26.0 - 34.0 pg   MCHC 31.3 30.0 - 36.0 g/dL   RDW 16.1 (H) 09.6 - 04.5 %   Platelets 261 150 - 400 K/uL   nRBC 0.0 0.0 - 0.2 %   Neutrophils Relative % 77 %   Neutro Abs 9.6 (H) 1.7 - 7.7 K/uL   Lymphocytes Relative 5 %   Lymphs Abs 0.6 (L) 0.7 - 4.0 K/uL   Monocytes Relative 6 %   Monocytes Absolute 0.8 0.1 - 1.0 K/uL   Eosinophils Relative 11 %   Eosinophils Absolute 1.4 (H) 0.0 - 0.5 K/uL   Basophils Relative 1 %   Basophils Absolute 0.1 0.0 - 0.1 K/uL   Immature Granulocytes 0 %   Abs Immature Granulocytes 0.03 0.00 - 0.07 K/uL  Blood gas, venous     Status: Abnormal   Collection Time: 02/15/23  7:35 PM  Result Value Ref Range   FIO2 100.0 %   pH, Ven 7.4 7.25 - 7.43   pCO2, Ven 44 44 - 60 mmHg   pO2, Ven 77 (H) 32 - 45 mmHg   Bicarbonate 27.3 20.0 - 28.0 mmol/L   Acid-Base Excess 2.2 (H) 0.0 - 2.0 mmol/L   O2 Saturation 96.8 %   Patient temperature 37.1    Collection site BLOOD RIGHT FOREARM    Drawn by [id number]   Troponin I (High Sensitivity)     Status: Abnormal   Collection Time: 02/15/23  7:35 PM  Result Value Ref Range   Troponin I (High Sensitivity) 55 (H) <18 ng/L  I-Stat Chem 8, ED     Status: Abnormal   Collection Time: 02/15/23  7:42 PM  Result Value Ref Range   Sodium 133 (L) 135 - 145 mmol/L   Potassium 4.7 3.5 - 5.1 mmol/L   Chloride 94 (L) 98 - 111 mmol/L   BUN 41 (H) 8 - 23 mg/dL   Creatinine, Ser 1.19 (H) 0.61 - 1.24 mg/dL   Glucose, Bld 147 (H) 70 - 99 mg/dL    Calcium, Ion 8.29 (L) 1.15 - 1.40 mmol/L   TCO2 22 22 - 32 mmol/L   Hemoglobin 10.2 (L) 13.0 - 17.0 g/dL   HCT 56.2 (L) 13.0 - 86.5 %   Basic Metabolic Panel: Recent Labs  Lab 02/15/23 1935 02/15/23 1942  NA 131* 133*  K 4.6 4.7  CL 92* 94*  CO2 24  --   GLUCOSE 378* 381*  BUN 45* 41*  CREATININE 7.44* 8.30*  CALCIUM 8.0*  --   MG 2.0  --    Liver Function Tests: Recent Labs  Lab 02/15/23 1935  AST 29  ALT 31  ALKPHOS 93  BILITOT 1.9*  PROT 7.1  ALBUMIN 3.3*   No results for input(s): "LIPASE", "AMYLASE" in the last 168 hours. No results for input(s): "AMMONIA" in the last 168 hours. CBC: Recent Labs  Lab 02/15/23 1935 02/15/23 1942  WBC 12.5*  --   NEUTROABS 9.6*  --   HGB 8.5* 10.2*  HCT 27.2* 30.0*  MCV 101.9*  --   PLT 261  --    Cardiac Enzymes: Recent Labs  Lab 02/15/23 1935  TROPONINIHS 55*    BNP (last 3 results) No results for input(s): "PROBNP" in the last 8760 hours. CBG: No results for input(s): "GLUCAP" in the last 168 hours.  Radiological Exams on Admission:  DG Chest [facility]  Result Date: 02/15/2023 CLINICAL DATA:  dyspnea EXAM: PORTABLE CHEST 1 VIEW COMPARISON:  Chest x-ray 09/16/2022, CT chest 09/17/2022 FINDINGS: Interval removal of a right chest wall dialysis catheter. The heart and mediastinal contours are unchanged. Atherosclerotic plaque. Increased fluffy interstitial markings with Kerley B lines. A. No pleural effusion. No pneumothorax. No acute osseous abnormality. IMPRESSION: 1. Pulmonary edema. 2.  Aortic Atherosclerosis (ICD10-I70.0). Electronically Signed   By: Tish Frederickson M.D.   On: 02/15/2023 20:16    EKG:   Assessment and Plan: * Acute respiratory failure with hypoxia (HCC) Seems to have developed earlier this morning in the setting of patient not taking his antihypertensive regimen and being found to be markedly hypertensive.  There is findings of pulmonary edema.  Patient's exterior jugular vein is right up  to the mandible.  Patient is currently being maintained on bilevel positive airway pressure.  Plan would be to control his blood pressure first, currently on nitroglycerin infusion, I will also add her.  On labetalol and hydralazine.  Once blood pressure is somewhere under 180 mmHg, I think we can trial the patient off of BiPAP.  Given the elevated white count, I cannot rule out pneumonia at this time.  Patient is also reporting marked nasal congestion and coughing since earlier today.  Therefore I will treat him on with ceftriaxone azithromycin and blood cultures.  Patient's BNP is elevated due to pulmonary edema from hypertension.  Patient's troponin is consistent with ESRD and at his baseline based on prior record review.  ESRD (end stage renal disease) (HCC) Patient was dialyzed yesterday per history, ER provider has engaged nephrology who will dialyze the patient in the morning.  Hyperglycemia Seems to be new, reticulocyte regimen at home.  I will start him on insulin sliding scale for now and check a hemoglobin A1c  Anemia of chronic disease Will order anemia panel in aM   Please review home med rec after home med collection by pharmacy.    Advance Care Planning:   Code Status: Prior full code  Consults: nephrology was caled by ER doc. Thank you.  Family Communication: mom at bedside. Labs reviewd with patient per his request.  Severity of Illness: The appropriate patient status for this patient is INPATIENT. Inpatient status is judged to be reasonable and necessary in order to provide the required intensity of service to ensure the patient's safety. The patient's presenting symptoms, physical exam findings, and initial radiographic and laboratory data in the context of their chronic comorbidities is felt to place them at high risk for further clinical deterioration. Furthermore, it is not anticipated that the patient will be medically stable for discharge from the hospital within 2  midnights of admission.   * I certify that at the point of admission it is my clinical judgment that the patient will require inpatient hospital care spanning beyond 2 midnights from the point of admission due to high intensity of service, high risk for further deterioration and high frequency of surveillance required.*  Author: Nolberto Hanlon, MD 02/15/2023 9:26 PM  For on call review [email or web address].

## 2023-02-15 NOTE — ED Notes (Signed)
ED Provider at bedside. 

## 2023-02-15 NOTE — ED Notes (Signed)
Pt is a dialysis pt, L arm restricted, unsure when he last had dialysis

## 2023-02-15 NOTE — ED Notes (Signed)
Pt placed on bipap, MD and RT at bedside

## 2023-02-15 NOTE — Assessment & Plan Note (Signed)
Patient was dialyzed yesterday per history, ER provider has engaged nephrology who will dialyze the patient in the morning.

## 2023-02-15 NOTE — ED Triage Notes (Signed)
Pt BIB [facility] from home, Per EMS Patient complaint of breathing problems for couple of days 6l baseline w/ Hx of CHF. 85% on 6L rales in lower lung fields, bloody sputum.

## 2023-02-15 NOTE — Assessment & Plan Note (Signed)
Seems to be new, reticulocyte regimen at home.  I will start him on insulin sliding scale for now and check a hemoglobin A1c

## 2023-02-15 NOTE — ED Notes (Signed)
Pt routinely removing bipap mask to spit and replaces mask back on face.

## 2023-02-15 NOTE — Assessment & Plan Note (Signed)
Seems to have developed earlier this morning in the setting of patient not taking his antihypertensive regimen and being found to be markedly hypertensive.  There is findings of pulmonary edema.  Patient's exterior jugular vein is right up to the mandible.  Patient is currently being maintained on bilevel positive airway pressure.  Plan would be to control his blood pressure first, currently on nitroglycerin infusion, I will also add her.  On labetalol and hydralazine.  Once blood pressure is somewhere under 180 mmHg, I think we can trial the patient off of BiPAP.  Given the elevated white count, I cannot rule out pneumonia at this time.  Patient is also reporting marked nasal congestion and coughing since earlier today.  Therefore I will treat him on with ceftriaxone azithromycin and blood cultures.  Patient's BNP is elevated due to pulmonary edema from hypertension.  Patient's troponin is consistent with ESRD and at his baseline based on prior record review.

## 2023-02-15 NOTE — Progress Notes (Signed)
eLink Physician-Brief Progress Note Patient Name: Matthew Robles DOB: 11-Sep-1955 MRN: [id number]   Date of Service  02/15/2023  HPI/Events of Note  Patient admitted with acute hypoxemic respiratory failure secondary to hypertensive urgency and pulmonary edema, against a background of ESRD and non-compliance with his anti-hypertensive Rx. He is currently on Nitroglycerine gtt and BIPAP.  eICU Interventions  New Patient Evaluation.        Thomasene Lot Oakleigh Hesketh 02/15/2023, 11:18 PM

## 2023-02-15 NOTE — Assessment & Plan Note (Signed)
Will order anemia panel in aM

## 2023-02-16 DIAGNOSIS — J9601 Acute respiratory failure with hypoxia: Secondary | ICD-10-CM | POA: Diagnosis not present

## 2023-02-16 LAB — CBC
HCT: 22.8 % — ABNORMAL LOW (ref 39.0–52.0)
Hemoglobin: 7.1 g/dL — ABNORMAL LOW (ref 13.0–17.0)
MCH: 31.4 pg (ref 26.0–34.0)
MCHC: 31.1 g/dL (ref 30.0–36.0)
MCV: 100.9 fL — ABNORMAL HIGH (ref 80.0–100.0)
Platelets: 218 10*3/uL (ref 150–400)
RBC: 2.26 MIL/uL — ABNORMAL LOW (ref 4.22–5.81)
RDW: 16.3 % — ABNORMAL HIGH (ref 11.5–15.5)
WBC: 9.7 10*3/uL (ref 4.0–10.5)
nRBC: 0 % (ref 0.0–0.2)

## 2023-02-16 LAB — IRON AND TIBC
Iron: 37 ug/dL — ABNORMAL LOW (ref 45–182)
Saturation Ratios: 16 % — ABNORMAL LOW (ref 17.9–39.5)
TIBC: 239 ug/dL — ABNORMAL LOW (ref 250–450)
UIBC: 202 ug/dL

## 2023-02-16 LAB — BASIC METABOLIC PANEL
Anion gap: 17 — ABNORMAL HIGH (ref 5–15)
BUN: 53 mg/dL — ABNORMAL HIGH (ref 8–23)
CO2: 22 mmol/L (ref 22–32)
Calcium: 8.1 mg/dL — ABNORMAL LOW (ref 8.9–10.3)
Chloride: 96 mmol/L — ABNORMAL LOW (ref 98–111)
Creatinine, Ser: 8.14 mg/dL — ABNORMAL HIGH (ref 0.61–1.24)
GFR, Estimated: 7 mL/min — ABNORMAL LOW (ref >60)
Glucose, Bld: 93 mg/dL (ref 70–99)
Potassium: 4.3 mmol/L (ref 3.5–5.1)
Sodium: 135 mmol/L (ref 135–145)

## 2023-02-16 LAB — FOLATE: Folate: 7.7 ng/mL (ref >5.9)

## 2023-02-16 LAB — RETICULOCYTES
Immature Retic Fract: 14.8 % (ref 2.3–15.9)
RBC.: 2.23 MIL/uL — ABNORMAL LOW (ref 4.22–5.81)
Retic Count, Absolute: 44.6 10*3/uL (ref 19.0–186.0)
Retic Ct Pct: 2 % (ref 0.4–3.1)

## 2023-02-16 LAB — PROTIME-INR
INR: 1.3 — ABNORMAL HIGH (ref 0.8–1.2)
Prothrombin Time: 16.7 seconds — ABNORMAL HIGH (ref 11.4–15.2)

## 2023-02-16 LAB — TROPONIN I (HIGH SENSITIVITY)
Troponin I (High Sensitivity): 537 ng/L (ref <18)
Troponin I (High Sensitivity): 312 ng/L (ref <18)

## 2023-02-16 LAB — HEMOGLOBIN A1C
Hgb A1c MFr Bld: 5.8 % — ABNORMAL HIGH (ref 4.8–5.6)
Mean Plasma Glucose: 120 mg/dL

## 2023-02-16 LAB — MRSA NEXT GEN BY PCR, NASAL: MRSA by PCR Next Gen: NOT DETECTED

## 2023-02-16 LAB — HEPATITIS B SURFACE ANTIGEN: Hepatitis B Surface Ag: NONREACTIVE

## 2023-02-16 LAB — APTT: aPTT: 37 seconds — ABNORMAL HIGH (ref 24–36)

## 2023-02-16 LAB — GLUCOSE, CAPILLARY
Glucose-Capillary: 168 mg/dL — ABNORMAL HIGH (ref 70–99)
Glucose-Capillary: 184 mg/dL — ABNORMAL HIGH (ref 70–99)
Glucose-Capillary: 127 mg/dL — ABNORMAL HIGH (ref 70–99)

## 2023-02-16 LAB — FERRITIN: Ferritin: 255 ng/mL (ref 24–336)

## 2023-02-16 LAB — PHOSPHORUS: Phosphorus: 5.2 mg/dL — ABNORMAL HIGH (ref 2.5–4.6)

## 2023-02-16 LAB — VITAMIN B12: Vitamin B-12: 2622 pg/mL — ABNORMAL HIGH (ref 180–914)

## 2023-02-16 MED ORDER — SODIUM CHLORIDE 0.9 % IV SOLN
100.0000 mg | INTRAVENOUS | Status: DC
  Administered 2023-02-16 – 2023-02-18 (×2): 100 mg via INTRAVENOUS
  Filled 2023-02-16: qty 100
  Filled 2023-02-16 (×2): qty 5

## 2023-02-16 MED ORDER — LIDOCAINE HCL (PF) 1 % IJ SOLN
5.0000 mL | INTRAMUSCULAR | Status: DC | PRN
  Administered 2023-02-17: 1 mL via INTRADERMAL

## 2023-02-16 MED ORDER — LIDOCAINE-PRILOCAINE 2.5-2.5 % EX CREA: 1.0000 | TOPICAL_CREAM | CUTANEOUS | Status: DC | PRN

## 2023-02-16 MED ORDER — SODIUM CHLORIDE 0.9 % IV SOLN
100.0000 mg | INTRAVENOUS | Status: DC
  Filled 2023-02-16 (×2): qty 5

## 2023-02-16 MED ORDER — METHOCARBAMOL 1000 MG/10ML IJ SOLN
500.0000 mg | Freq: Once | INTRAVENOUS | Status: DC
  Filled 2023-02-16: qty 5

## 2023-02-16 MED ORDER — HEPARIN SODIUM (PORCINE) 1000 UNIT/ML DIALYSIS
1000.0000 [IU] | INTRAMUSCULAR | Status: DC | PRN
  Administered 2023-02-16 – 2023-02-18 (×3): 1000 [IU] via INTRAVENOUS_CENTRAL

## 2023-02-16 MED ORDER — MELATONIN 3 MG PO TABS
9.0000 mg | ORAL_TABLET | Freq: Every day | ORAL | Status: DC
  Administered 2023-02-16 – 2023-02-19 (×4): 9 mg via ORAL
  Filled 2023-02-16 (×4): qty 3

## 2023-02-16 MED ORDER — DARBEPOETIN ALFA 100 MCG/0.5ML IJ SOSY
100.0000 ug | PREFILLED_SYRINGE | Freq: Once | INTRAMUSCULAR | Status: AC
  Administered 2023-02-16: 100 ug via SUBCUTANEOUS
  Filled 2023-02-16: qty 0.5

## 2023-02-16 MED ORDER — LABETALOL HCL 200 MG PO TABS
200.0000 mg | ORAL_TABLET | Freq: Two times a day (BID) | ORAL | Status: DC
  Administered 2023-02-16 – 2023-02-17 (×4): 200 mg via ORAL
  Filled 2023-02-16 (×13): qty 1

## 2023-02-16 MED ORDER — PENTAFLUOROPROP-TETRAFLUOROETH EX AERO: 1.0000 | INHALATION_SPRAY | CUTANEOUS | Status: DC | PRN

## 2023-02-16 MED ORDER — ORAL CARE MOUTH RINSE: 15.0000 mL | OROMUCOSAL | Status: DC | PRN

## 2023-02-16 MED ORDER — CYCLOBENZAPRINE HCL 10 MG PO TABS
5.0000 mg | ORAL_TABLET | Freq: Once | ORAL | Status: AC
  Administered 2023-02-16: 5 mg via ORAL
  Filled 2023-02-16: qty 1

## 2023-02-16 NOTE — Progress Notes (Signed)
HEMODIALYSIS TREATMENT NOTE:  Pre-HD A/O, conversant, NAD.  177/98, MAP 120 on NTG infusion of 300 mcg/m.  spO2 99% on HFNC 8 lpm.  AVF was cannulated without difficulty and HD commenced with 4-4.5 L goal.  NTG was titrated as low as 150 mcg but then had to be increased to 175 mcg/min despite aggressive ultrafiltration.  3.5 hour low-heparin treatment completed.  All blood was returned. O2 weaned from 10 to 5L via HFNC.  Net UF 4.4 liters.  Meds given: heparin, darbepoetin, venofer.  Post-treatment:  02/16/23 1415  Vitals  Temp 98.2 F (36.8 C)  Temp Source Oral  BP (!) 190/86  MAP (mmHg) 124  BP Location Right Arm  BP Method Automatic  Patient Position (if appropriate) Sitting  Pulse Rate 99  Pulse Rate Source Monitor  ECG Heart Rate 98  Resp 18  Oxygen Therapy  SpO2 95 %  O2 Device HFNC  O2 Flow Rate (L/min) 5 L/min  Post Treatment  Dialyzer Clearance Lightly streaked  Hemodialysis Intake (mL) 0 mL  Liters Processed 81.5  Fluid Removed (mL) 4400 mL  Tolerated HD Treatment Yes  Post-Hemodialysis Comments Goal met  AVG/AVF Arterial Site Held (minutes) 10 minutes  AVG/AVF Venous Site Held (minutes) 10 minutes  Fistula / Graft Left Forearm Arteriovenous fistula  Placement Date/Time: 07/13/22 1045   Orientation: Left  Access Location: Forearm  Access Type: (c) Arteriovenous fistula  Fistula / Graft Assessment Thrill;Bruit  Status Patent    Arman Filter, RN [facility]

## 2023-02-16 NOTE — Consult Note (Signed)
Reason for Consult: To manage dialysis and dialysis related needs Referring Physician: Dr Charlene Brooke is an 67 y.o. male.  HPI: Pt is a 5M with a PMH of ESRD on HD TTS, DM II, h/o CVA who is now seen in consultation at the request of Dr. Jarvis Newcomer for eval and recs re: provision of HD and management of ESRD.  Came in [facility] ED last night in respiratory distress thought to be with pulm edema.  Was treated with nitroglycerin gtt and BiPaP.  K was 4.3.  Initially wanted to leave AMA but has stayed.    In this setting we are asked to see.   He reports feeling better but does note neck "pressure" that he can't correlate with activity, sometimes positional.    Sounds like he's been cutting time, unclear.   [facility].  AVF  Past Medical History:  Diagnosis Date   CHF (congestive heart failure) (HCC)    Chronic kidney disease    Diabetes mellitus without complication (HCC)    Hypertension    Stroke (HCC)    2017    Past Surgical History:  Procedure Laterality Date   AV FISTULA PLACEMENT Left 07/13/2022   Procedure: LEFT ARM ARTERIOVENOUS (AV) FISTULA CREATION;  Surgeon: [name], MD;  Location: [facility];  Service: Vascular;  Laterality: Left;   BASCILIC VEIN TRANSPOSITION Left 08/31/2022   Procedure: LEFT ARM SECOND STAGE BASILIC VEIN TRANSPOSITION;  Surgeon: [name], MD;  Location: [facility];  Service: Vascular;  Laterality: Left;   INSERTION OF DIALYSIS CATHETER Right 06/25/2022   Procedure: INSERTION OF DIALYSIS CATHETER;  Surgeon: Lucretia Roers, MD;  Location: [facility];  Service: General;  Laterality: Right;   WISDOM TOOTH EXTRACTION      Family History  Problem Relation Age of Onset   Diabetes Mother    Hypertension Mother    Diabetes Maternal Grandfather     Social History:  reports that he quit smoking about 5 years ago. His smoking use included cigarettes. He has never used smokeless tobacco. He reports that he does not currently use alcohol after a past  usage of about 4.0 standard drinks of alcohol per week. He reports that he does not currently use drugs after having used the following drugs: Marijuana.  Allergies: No Known Allergies  Medications: Scheduled:  amLODipine  10 mg Oral Daily   calcitRIOL  0.5 mcg Oral Daily   Chlorhexidine Gluconate Cloth  6 each Topical Q0600   furosemide  80 mg Oral Daily   heparin  5,000 Units Subcutaneous Q8H   hydrALAZINE  75 mg Oral Q8H   influenza vaccine adjuvanted  0.5 mL Intramuscular Tomorrow-1000   insulin aspart  0-5 Units Subcutaneous QHS   insulin aspart  0-9 Units Subcutaneous TID WC   labetalol  200 mg Oral BID   pneumococcal 20-valent conjugate vaccine  0.5 mL Intramuscular Tomorrow-1000   sodium chloride flush  3 mL Intravenous Q12H     Results for orders placed or performed during the hospital encounter of 02/15/23 (from the past 48 hour(s))  Resp panel by RT-PCR (RSV, Flu A&B, Covid) Anterior Nasal Swab     Status: None   Collection Time: 02/15/23  7:35 PM   Specimen: Anterior Nasal Swab  Result Value Ref Range   SARS Coronavirus 2 by RT PCR NEGATIVE NEGATIVE    Comment: (NOTE) SARS-CoV-2 target nucleic acids are NOT DETECTED.  The SARS-CoV-2 RNA is generally detectable in upper respiratory specimens  during the acute phase of infection. The lowest concentration of SARS-CoV-2 viral copies this assay can detect is 138 copies/mL. A negative result does not preclude SARS-Cov-2 infection and should not be used as the sole basis for treatment or other patient management decisions. A negative result may occur with  improper specimen collection/handling, submission of specimen other than nasopharyngeal swab, presence of viral mutation(s) within the areas targeted by this assay, and inadequate number of viral copies(<138 copies/mL). A negative result must be combined with clinical observations, patient history, and epidemiological information. The expected result is Negative.  Fact  Sheet for Patients:  BloggerCourse.com  Fact Sheet for Healthcare Providers:  SeriousBroker.it  This test is no t yet approved or cleared by the [place] FDA and  has been authorized for detection and/or diagnosis of SARS-CoV-2 by FDA under an Emergency Use Authorization (EUA). This EUA will remain  in effect (meaning this test can be used) for the duration of the COVID-19 declaration under Section 564(b)(1) of the Act, 21 U.S.C.section 360bbb-3(b)(1), unless the authorization is terminated  or revoked sooner.       Influenza A by PCR NEGATIVE NEGATIVE   Influenza B by PCR NEGATIVE NEGATIVE    Comment: (NOTE) The Xpert Xpress SARS-CoV-2/FLU/RSV plus assay is intended as an aid in the diagnosis of influenza from Nasopharyngeal swab specimens and should not be used as a sole basis for treatment. Nasal washings and aspirates are unacceptable for Xpert Xpress SARS-CoV-2/FLU/RSV testing.  Fact Sheet for Patients: BloggerCourse.com  Fact Sheet for Healthcare Providers: SeriousBroker.it  This test is not yet approved or cleared by the [place] FDA and has been authorized for detection and/or diagnosis of SARS-CoV-2 by FDA under an Emergency Use Authorization (EUA). This EUA will remain in effect (meaning this test can be used) for the duration of the COVID-19 declaration under Section 564(b)(1) of the Act, 21 U.S.C. section 360bbb-3(b)(1), unless the authorization is terminated or revoked.     Resp Syncytial Virus by PCR NEGATIVE NEGATIVE    Comment: (NOTE) Fact Sheet for Patients: BloggerCourse.com  Fact Sheet for Healthcare Providers: SeriousBroker.it  This test is not yet approved or cleared by the [place] FDA and has been authorized for detection and/or diagnosis of SARS-CoV-2 by FDA under an Emergency Use  Authorization (EUA). This EUA will remain in effect (meaning this test can be used) for the duration of the COVID-19 declaration under Section 564(b)(1) of the Act, 21 U.S.C. section 360bbb-3(b)(1), unless the authorization is terminated or revoked.  Performed at [facility], [address], [place], [place] [address]   Comprehensive metabolic panel     Status: Abnormal   Collection Time: 02/15/23  7:35 PM  Result Value Ref Range   Sodium 131 (L) 135 - 145 mmol/L   Potassium 4.6 3.5 - 5.1 mmol/L   Chloride 92 (L) 98 - 111 mmol/L   CO2 24 22 - 32 mmol/L   Glucose, Bld 378 (H) 70 - 99 mg/dL    Comment: Glucose reference range applies only to samples taken after fasting for at least 8 hours.   BUN 45 (H) 8 - 23 mg/dL   Creatinine, Ser 8.65 (H) 0.61 - 1.24 mg/dL   Calcium 8.0 (L) 8.9 - 10.3 mg/dL   Total Protein 7.1 6.5 - 8.1 g/dL   Albumin 3.3 (L) 3.5 - 5.0 g/dL   AST 29 15 - 41 U/L   ALT 31 0 - 44 U/L   Alkaline Phosphatase 93 38 - 126  U/L   Total Bilirubin 1.9 (H) 0.3 - 1.2 mg/dL   GFR, Estimated 7 (L) >60 mL/min    Comment: (NOTE) Calculated using the CKD-EPI Creatinine Equation (2021)    Anion gap 15 5 - 15    Comment: Performed at [facility], [address] [place], [place] [address]  Brain natriuretic peptide     Status: Abnormal   Collection Time: 02/15/23  7:35 PM  Result Value Ref Range   B Natriuretic Peptide >4,500.0 (H) 0.0 - 100.0 pg/mL    Comment: Performed at [facility], [address], [place], [place] [address]  Magnesium     Status: None   Collection Time: 02/15/23  7:35 PM  Result Value Ref Range   Magnesium 2.0 1.7 - 2.4 mg/dL    Comment: Performed at [facility], [address] [place], [place] [address]  CBC with Differential/Platelet     Status: Abnormal   Collection Time: 02/15/23  7:35 PM  Result Value Ref Range   WBC 12.5 (H) 4.0 - 10.5 K/uL   RBC 2.67 (L) 4.22 - 5.81 MIL/uL   Hemoglobin 8.5 (L) 13.0 - 17.0 g/dL   HCT 36.6 (L) 44.0 - 34.7 %    MCV 101.9 (H) 80.0 - 100.0 fL   MCH 31.8 26.0 - 34.0 pg   MCHC 31.3 30.0 - 36.0 g/dL   RDW 42.5 (H) 95.6 - 38.7 %   Platelets 261 150 - 400 K/uL   nRBC 0.0 0.0 - 0.2 %   Neutrophils Relative % 77 %   Neutro Abs 9.6 (H) 1.7 - 7.7 K/uL   Lymphocytes Relative 5 %   Lymphs Abs 0.6 (L) 0.7 - 4.0 K/uL   Monocytes Relative 6 %   Monocytes Absolute 0.8 0.1 - 1.0 K/uL   Eosinophils Relative 11 %   Eosinophils Absolute 1.4 (H) 0.0 - 0.5 K/uL   Basophils Relative 1 %   Basophils Absolute 0.1 0.0 - 0.1 K/uL   Immature Granulocytes 0 %   Abs Immature Granulocytes 0.03 0.00 - 0.07 K/uL    Comment: Performed at [facility], [address] [place], [place] [address]  Blood gas, venous     Status: Abnormal   Collection Time: 02/15/23  7:35 PM  Result Value Ref Range   FIO2 100.0 %   pH, Ven 7.4 7.25 - 7.43   pCO2, Ven 44 44 - 60 mmHg   pO2, Ven 77 (H) 32 - 45 mmHg   Bicarbonate 27.3 20.0 - 28.0 mmol/L   Acid-Base Excess 2.2 (H) 0.0 - 2.0 mmol/L   O2 Saturation 96.8 %   Patient temperature 37.1    Collection site BLOOD RIGHT FOREARM    Drawn by [id number]     Comment: Performed at [facility], [address] [place], [place] [address]  Troponin I (High Sensitivity)     Status: Abnormal   Collection Time: 02/15/23  7:35 PM  Result Value Ref Range   Troponin I (High Sensitivity) 55 (H) <18 ng/L    Comment: (NOTE) Elevated high sensitivity troponin I (hsTnI) values and significant  changes across serial measurements may suggest ACS but many other  chronic and acute conditions are known to elevate hsTnI results.  Refer to the "Links" section for chest pain algorithms and additional  guidance. Performed at [facility], [address] [place], [place] [address]   I-Stat Chem 8, ED     Status: Abnormal   Collection Time: 02/15/23  7:42 PM  Result Value  Ref Range   Sodium 133 (L) 135 - 145 mmol/L   Potassium 4.7 3.5 - 5.1 mmol/L   Chloride 94 (L) 98 - 111 mmol/L   BUN 41 (H) 8 - 23 mg/dL    Creatinine, Ser 9.60 (H) 0.61 - 1.24 mg/dL   Glucose, Bld 454 (H) 70 - 99 mg/dL    Comment: Glucose reference range applies only to samples taken after fasting for at least 8 hours.   Calcium, Ion 0.94 (L) 1.15 - 1.40 mmol/L   TCO2 22 22 - 32 mmol/L   Hemoglobin 10.2 (L) 13.0 - 17.0 g/dL   HCT 09.8 (L) 11.9 - 14.7 %  Troponin I (High Sensitivity)     Status: Abnormal   Collection Time: 02/15/23  8:55 PM  Result Value Ref Range   Troponin I (High Sensitivity) 120 (HH) <18 ng/L    Comment: CRITICAL RESULT CALLED TO, READ BACK BY AND VERIFIED WITH MELISSA RAGLAND AT 2144 02/15/2023 BY T KENNEDY DELTA CHECK NOTED (NOTE) Elevated high sensitivity troponin I (hsTnI) values and significant  changes across serial measurements may suggest ACS but many other  chronic and acute conditions are known to elevate hsTnI results.  Refer to the "Links" section for chest pain algorithms and additional  guidance. Performed at [facility], [address] [place], [place] [address]   CBG monitoring, ED     Status: Abnormal   Collection Time: 02/15/23 10:07 PM  Result Value Ref Range   Glucose-Capillary 117 (H) 70 - 99 mg/dL    Comment: Glucose reference range applies only to samples taken after fasting for at least 8 hours.  Culture, blood (Routine X 2) w Reflex to ID Panel     Status: None (Preliminary result)   Collection Time: 02/15/23 10:10 PM   Specimen: BLOOD RIGHT WRIST  Result Value Ref Range   Specimen Description BLOOD RIGHT WRIST    Special Requests      BOTTLES DRAWN AEROBIC AND ANAEROBIC Blood Culture results may not be optimal due to an excessive volume of blood received in culture bottles   Culture      NO GROWTH < 12 HOURS Performed at [facility], [address], [place], [place] [address]    Report Status PENDING   Culture, blood (Routine X 2) w Reflex to ID Panel     Status: None (Preliminary result)   Collection Time: 02/15/23 10:10 PM   Specimen: BLOOD RIGHT HAND  Result  Value Ref Range   Specimen Description BLOOD RIGHT HAND    Special Requests      BOTTLES DRAWN AEROBIC AND ANAEROBIC Blood Culture adequate volume   Culture      NO GROWTH < 12 HOURS Performed at [facility], [address], [place], [place] [address]    Report Status PENDING   MRSA Next Gen by PCR, Nasal     Status: None   Collection Time: 02/15/23 10:47 PM   Specimen: Nasal Mucosa; Nasal Swab  Result Value Ref Range   MRSA by PCR Next Gen NOT DETECTED NOT DETECTED    Comment: (NOTE) The GeneXpert MRSA Assay (FDA approved for NASAL specimens only), is one component of a comprehensive MRSA colonization surveillance program. It is not intended to diagnose MRSA infection nor to guide or monitor treatment for MRSA infections. Test performance is not FDA approved in patients less than 60 years old. Performed at [facility], [address] [place], [place] [address]   Vitamin B12  Status: Abnormal   Collection Time: 02/16/23  5:12 AM  Result Value Ref Range   Vitamin B-12 2,622 (H) 180 - 914 pg/mL    Comment: RESULT CONFIRMED BY MANUAL DILUTION (NOTE) This assay is not validated for testing neonatal or myeloproliferative syndrome specimens for Vitamin B12 levels. Performed at [facility], [address] [place], [place] [address]   Folate     Status: None   Collection Time: 02/16/23  5:12 AM  Result Value Ref Range   Folate 7.7 >5.9 ng/mL    Comment: Performed at [facility], [address] [place], [place] [address]  Iron and TIBC     Status: Abnormal   Collection Time: 02/16/23  5:12 AM  Result Value Ref Range   Iron 37 (L) 45 - 182 ug/dL   TIBC 301 (L) 601 - 093 ug/dL   Saturation Ratios 16 (L) 17.9 - 39.5 %   UIBC 202 ug/dL    Comment: Performed at [facility], [address] [place], [place] [address]  Ferritin     Status: None   Collection Time: 02/16/23  5:12 AM  Result Value Ref Range   Ferritin 255 24 - 336 ng/mL    Comment: Performed at [facility], [address], [place], [place] [address]  Reticulocytes     Status: Abnormal   Collection Time: 02/16/23  5:12 AM  Result Value Ref Range   Retic Ct Pct 2.0 0.4 - 3.1 %   RBC. 2.23 (L) 4.22 - 5.81 MIL/uL   Retic Count, Absolute 44.6 19.0 - 186.0 K/uL   Immature Retic Fract 14.8 2.3 - 15.9 %    Comment: Performed at [facility], [address], [place], [place] [address]  APTT     Status: Abnormal   Collection Time: 02/16/23  5:12 AM  Result Value Ref Range   aPTT 37 (H) 24 - 36 seconds    Comment:        IF BASELINE aPTT IS ELEVATED, SUGGEST PATIENT RISK ASSESSMENT BE USED TO DETERMINE APPROPRIATE ANTICOAGULANT THERAPY. Performed at [facility], [address] [place], [place] [address]   Protime-INR     Status: Abnormal   Collection Time: 02/16/23  5:12 AM  Result Value Ref Range   Prothrombin Time 16.7 (H) 11.4 - 15.2 seconds   INR 1.3 (H) 0.8 - 1.2    Comment: (NOTE) INR goal varies based on device and disease states. Performed at [facility], [address], [place], [place] [address]   Basic metabolic panel     Status: Abnormal   Collection Time: 02/16/23  5:12 AM  Result Value Ref Range   Sodium 135 135 - 145 mmol/L   Potassium 4.3 3.5 - 5.1 mmol/L   Chloride 96 (L) 98 - 111 mmol/L   CO2 22 22 - 32 mmol/L   Glucose, Bld 93 70 - 99 mg/dL    Comment: Glucose reference range applies only to samples taken after fasting for at least 8 hours.   BUN 53 (H) 8 - 23 mg/dL   Creatinine, Ser 1.76 (H) 0.61 - 1.24 mg/dL   Calcium 8.1 (L) 8.9 - 10.3 mg/dL   GFR, Estimated 7 (L) >60 mL/min    Comment: (NOTE) Calculated using the CKD-EPI Creatinine Equation (2021)    Anion gap 17 (H) 5 - 15    Comment: Performed at [facility], [address] [place], [place] [address]  CBC     Status: Abnormal   Collection Time: 02/16/23  5:12 AM  Result Value Ref Range   WBC 9.7 4.0 - 10.5 K/uL   RBC 2.26 (L) 4.22 - 5.81 MIL/uL   Hemoglobin 7.1 (L) 13.0 - 17.0 g/dL   HCT 29.5 (L)  62.1 - 52.0 %   MCV 100.9 (H) 80.0 - 100.0 fL   MCH 31.4 26.0 - 34.0 pg   MCHC 31.1 30.0 - 36.0 g/dL   RDW 30.8 (H) 65.7 - 84.6 %   Platelets 218 150 - 400 K/uL   nRBC 0.0 0.0 - 0.2 %    Comment: Performed at [facility], [address], [place], [place] [address]  Phosphorus     Status: Abnormal   Collection Time: 02/16/23  5:12 AM  Result Value Ref Range   Phosphorus 5.2 (H) 2.5 - 4.6 mg/dL    Comment: Performed at [facility], [address], [place], [place] [address]  Troponin I (High Sensitivity)     Status: Abnormal   Collection Time: 02/16/23  5:12 AM  Result Value Ref Range   Troponin I (High Sensitivity) 537 (HH) <18 ng/L    Comment: CRITICAL RESULT CALLED TO, READ BACK BY AND VERIFIED WITH PUCKETT,O AT 7:31 ON 02/16/23 BY PURDIE,J (NOTE) Elevated high sensitivity troponin I (hsTnI) values and significant  changes across serial measurements may suggest ACS but many other  chronic and acute conditions are known to elevate hsTnI results.  Refer to the "Links" section for chest pain algorithms and additional  guidance. Performed at [facility], [address], [place], [place] [address]     DG Chest Port 1 View  Result Date: 02/15/2023 CLINICAL DATA:  dyspnea EXAM: PORTABLE CHEST 1 VIEW COMPARISON:  Chest x-ray 09/16/2022, CT chest 09/17/2022 FINDINGS: Interval removal of a right chest wall dialysis catheter. The heart and mediastinal contours are unchanged. Atherosclerotic plaque. Increased fluffy interstitial markings with Kerley B lines. A. No pleural effusion. No pneumothorax. No acute osseous abnormality. IMPRESSION: 1. Pulmonary edema. 2.  Aortic Atherosclerosis (ICD10-I70.0). Electronically Signed   By: Tish Frederickson M.D.   On: 02/15/2023 20:16    ROS: all other systems reviewed and are negative except as per HPI Blood pressure (!) 177/89, pulse 93, temperature 98.1 F (36.7 C), temperature source Oral, resp. rate (!) 23, height 6' (1.829 m), weight 84.3 kg, SpO2  96%. GEN  NAD, sitting in bed, on O2 HEENT  EOMI  NECK + JVD PULM bibasilar crackles CV RRR ABD soft EXT 1+ tight LE edema NEURO nonfocal ACCESS:  L AVF + T/B  Assessment/Plan: 1 Acute hypoxic RF: in the setting of flash pulm edema, hypertensive emergency, and nonadherence to BP meds.  HD today, maybe will need another session tomorrow depending.  Orders written 2 ESRD: TTS [facility] 3 Hypertension:  on nitroglycerin gtt, expect to improve with UF and will likely need adjustment in EDW and BP regimen 4. Anemia of ESRD: will do IV iron and ESA 5. Metabolic Bone Disease: binders when eating 6.  Dispo: in ICU/SD for now  Bufford Buttner 02/16/2023, 10:07 AM

## 2023-02-16 NOTE — Progress Notes (Signed)
[facility] PROGRESS NOTE  Keitaro Sarpy (DOB: 08-Aug-1955) EXB:[id number] PCP: Elmer Picker [facility]  Brief Narrative: Matthew Robles is a 67 y.o. male with a history of ESRD (HD TTS), CVA, T2DM, HTN who presented to the ED on 02/15/2023 with respiratory distress found to have pulmonary edema and HTN urgency. Admitted on BiPAP and nitroglycerin gtt to ICU at [facility]. Nephrology consulted, dialysis arranged for the following morning. Respiratory status has improved considerably.   Subjective: Feeling fine, no HA, chest pain or pressure. Describes a neck fullness he gets at times usually when sleeping that he does not currently have. His breathing is much better since admission.   Objective: BP (!) 175/86 Comment: after blood return post-HD  Pulse 97   Temp 98.4 F (36.9 C) (Oral)   Resp 16   Ht 6' (1.829 m)   Wt 84.7 kg   SpO2 97%   BMI 25.33 kg/m   Gen: Nontoxic male in no distress Neck: Nontender soft midline ~1 inch diameter subcutaneous nodule Pulm: Clear, nonlabored, speaking in full sentences  CV: RRR, no MRG, High amplitude telemetry QRS complexes. Trace edema.  GI: Soft, NT, ND, +BS Neuro: Alert and oriented. No new focal deficits. Ext: Warm, no deformities. +bruit and thrill RUA AVF Skin: No rashes, lesions or ulcers on visualized skin   Assessment & Plan: Acute on chronic hypoxic respiratory failure: Has O2 at home.  - Wean from BiPAP > HFNC and after dialysis, hope to get to [facility]. Will have to reevaluate BPs, respiratory status after dialysis to guide next steps.   HTN with HTN urgency with demand myocardial ischemia: Unclear how much excess volume is driving this.  - Reevaluate after HD. Requiring 356mcg/min of NTG at this time. Received multiple doses of prn IV labetalol, hydralazine overnight.  - Restarted labetalol, hydralazine, lasix, give norvasc.  - Will monitor troponin after HD, check ECG. Pt has no anginal complaints and no ST changes by  telemetry.  Acute pulmonary edema, ESRD:  - Appreciate nephrology evaluation, HD planned 9/25. Plan additional HD 9/26 (would be back on his TTS schedule anyway).   Neck "pressure": Does not appear to be an anginal equivalent, not exertional. 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 No signs/symptoms of SVC syndrome.  - Check TSH  T2DM: Well-controlled chronically with HbA1c 5.9%.  - SSI  - Reinforced that he needs regular ophthalmology exams as he reports some distortion that suggests macular degeneration.   Anemia of ESRD, iron deficiency:  - ESA, IV iron per nephrology.  - Repeat H/H in AM with transfusion threshold 7g/dl   Tyrone Nine, MD [facility] [email or web address] 02/16/2023, 2:43 PM

## 2023-02-16 NOTE — Progress Notes (Signed)
Patient placed back on Bipap.

## 2023-02-16 NOTE — Plan of Care (Signed)

## 2023-02-16 NOTE — TOC Initial Note (Signed)
Transition of Care [facility]) - Initial/Assessment Note    Patient Details  Name: Matthew Robles MRN: [id number] Date of Birth: 11-Jul-1955  Transition of [facility]) CM/SW Contact:    Villa Herb, LCSWA Phone Number: 02/16/2023, 12:16 PM  Clinical Narrative:                 Pt is high risk for readmission. CSW spoke with pt to complete assessment. Pt states that he lives with his mother. Pt is independent in completing his ADLs. Pt drives to appointments and HD sessions. Pt has not had HH in the past. Pt states that he has a walker to use if needed. Pt states that he has home O2 concentrator and tanks at home to use if needed. CSW noted per chart review that pt has home O2 through [employer]. TOC to follow.   Expected Discharge Plan: Home/Self Care Barriers to Discharge: Continued Medical Work up   Patient Goals and CMS Choice Patient states their goals for this hospitalization and ongoing recovery are:: return home CMS Medicare.gov Compare Post Acute Care list provided to:: Patient Choice offered to / list presented to : Patient      Expected Discharge Plan and Services In-house Referral: Clinical Social Work Discharge Planning Services: CM Consult   Living arrangements for the past 2 months: Single Family Home                                      Prior Living Arrangements/Services Living arrangements for the past 2 months: Single Family Home Lives with:: Parents Patient language and need for interpreter reviewed:: Yes Do you feel safe going back to the place where you live?: Yes      Need for Family Participation in Patient Care: Yes (Comment) Care giver support system in place?: Yes (comment) Current home services: DME Criminal Activity/Legal Involvement Pertinent to Current Situation/Hospitalization: No - Comment as needed  Activities of Daily Living   ADL Screening (condition at time of admission) Does the patient have a NEW difficulty with  bathing/dressing/toileting/self-feeding that is expected to last >3 days?: No Does the patient have a NEW difficulty with getting in/out of bed, walking, or climbing stairs that is expected to last >3 days?: No Does the patient have a NEW difficulty with communication that is expected to last >3 days?: No Is the patient deaf or have difficulty hearing?: No Does the patient have difficulty seeing, even when wearing glasses/contacts?: No Does the patient have difficulty concentrating, remembering, or making decisions?: No  Permission Sought/Granted                  Emotional Assessment Appearance:: Appears stated age Attitude/Demeanor/Rapport: Engaged Affect (typically observed): Accepting Orientation: : Oriented to Self, Oriented to Place, Oriented to  Time, Oriented to Situation Alcohol / Substance Use: Not Applicable Psych Involvement: No (comment)  Admission diagnosis:  Acute respiratory failure (HCC) [J96.00] Patient Active Problem List   Diagnosis Date Noted   Hyperglycemia 02/15/2023   Hemoptysis 09/17/2022   Volume overload 09/16/2022   Acute respiratory failure (HCC) 09/16/2022   Acute HFrEF (heart failure with reduced ejection fraction) (HCC) 06/30/2022   ESRD (end stage renal disease) (HCC) 06/25/2022   Acute respiratory failure with hypoxia (HCC) 06/24/2022   Anemia of chronic disease 06/24/2022   Bilateral pleural effusion 06/24/2022   Hypoalbuminemia 06/24/2022   Hypertensive urgency 01/15/2016   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  DM (diabetes mellitus), type 2 with neurological complications (HCC) 01/15/2016   Hypokalemia 01/15/2016   PCP:  [facility] Pharmacy:   [facility] [address], [place] - [address] [place] [address] Phone: [phone] Fax: [phone]  [facility] [place], [place] - [address] [place] [address] [place]  [address] Phone: [phone] Fax: [phone]     Social Determinants of Health (SDOH) Social History: SDOH Screenings   Food Insecurity: No Food Insecurity (02/15/2023)  Housing: Low Risk  (02/15/2023)  Transportation Needs: No Transportation Needs (02/15/2023)  Utilities: Not At Risk (02/15/2023)  Tobacco Use: Medium Risk (10/06/2022)  Health Literacy: Low Risk  (06/16/2021)   [facility]   SDOH Interventions:     Readmission Risk Interventions    02/16/2023   12:14 PM 09/17/2022    9:27 AM 06/29/2022   12:02 PM  Readmission Risk Prevention Plan  Transportation Screening Complete Complete Complete  HRI or Home Care Consult  Complete Complete  Social Work Consult for Recovery Care Planning/Counseling  Complete Complete  Palliative Care Screening  Not Applicable Not Applicable  Medication Review Ocean[employer]) Complete Complete Complete  HRI or Home Care Consult Complete    SW Recovery Care/Counseling Consult Complete    Palliative Care Screening Not Applicable    Skilled Nursing Facility Not Applicable

## 2023-02-17 ENCOUNTER — Inpatient Hospital Stay (HOSPITAL_COMMUNITY): Payer: Medicare Other

## 2023-02-17 DIAGNOSIS — J9601 Acute respiratory failure with hypoxia: Secondary | ICD-10-CM | POA: Diagnosis not present

## 2023-02-17 LAB — CULTURE, BLOOD (ROUTINE X 2): Special Requests: ADEQUATE

## 2023-02-17 LAB — CULTURE, BLOOD (SINGLE)

## 2023-02-17 LAB — GLUCOSE, CAPILLARY
Glucose-Capillary: 122 mg/dL — ABNORMAL HIGH (ref 70–99)
Glucose-Capillary: 121 mg/dL — ABNORMAL HIGH (ref 70–99)
Glucose-Capillary: 206 mg/dL — ABNORMAL HIGH (ref 70–99)
Glucose-Capillary: 106 mg/dL — ABNORMAL HIGH (ref 70–99)

## 2023-02-17 LAB — RENAL FUNCTION PANEL
Albumin: 2.9 g/dL — ABNORMAL LOW (ref 3.5–5.0)
Anion gap: 10 (ref 5–15)
BUN: 29 mg/dL — ABNORMAL HIGH (ref 8–23)
CO2: 27 mmol/L (ref 22–32)
Calcium: 8.1 mg/dL — ABNORMAL LOW (ref 8.9–10.3)
Chloride: 95 mmol/L — ABNORMAL LOW (ref 98–111)
Creatinine, Ser: 6.1 mg/dL — ABNORMAL HIGH (ref 0.61–1.24)
GFR, Estimated: 9 mL/min — ABNORMAL LOW (ref >60)
Glucose, Bld: 141 mg/dL — ABNORMAL HIGH (ref 70–99)
Phosphorus: 4.1 mg/dL (ref 2.5–4.6)
Potassium: 3.6 mmol/L (ref 3.5–5.1)
Sodium: 132 mmol/L — ABNORMAL LOW (ref 135–145)

## 2023-02-17 LAB — PREPARE RBC (CROSSMATCH)

## 2023-02-17 LAB — TYPE AND SCREEN
ABO/RH(D): O POS
Antibody Screen: NEGATIVE
Unit division: 0

## 2023-02-17 LAB — BPAM RBC
Blood Product Expiration Date: 202410222359
ISSUE DATE / TIME: 202409261019
Unit Type and Rh: 5100

## 2023-02-17 LAB — HEMOGLOBIN AND HEMATOCRIT, BLOOD
HCT: 20.6 % — ABNORMAL LOW (ref 39.0–52.0)
HCT: 24.8 % — ABNORMAL LOW (ref 39.0–52.0)
Hemoglobin: 6.8 g/dL — CL (ref 13.0–17.0)
Hemoglobin: 7.8 g/dL — ABNORMAL LOW (ref 13.0–17.0)

## 2023-02-17 LAB — HEPATITIS B SURFACE ANTIBODY, QUANTITATIVE: Hep B S AB Quant (Post): <3.5 m[IU]/mL — ABNORMAL LOW

## 2023-02-17 LAB — TSH: TSH: 2.139 u[IU]/mL (ref 0.350–4.500)

## 2023-02-17 MED ORDER — METHOCARBAMOL 1000 MG/10ML IJ SOLN
INTRAMUSCULAR | Status: AC
  Filled 2023-02-17: qty 10

## 2023-02-17 MED ORDER — SODIUM CHLORIDE 0.9% IV SOLUTION: Freq: Once | INTRAVENOUS | Status: AC

## 2023-02-17 MED ORDER — GUAIFENESIN-DM 100-10 MG/5ML PO SYRP
5.0000 mL | ORAL_SOLUTION | ORAL | Status: DC | PRN
  Administered 2023-02-17: 5 mL via ORAL
  Filled 2023-02-17: qty 5

## 2023-02-17 MED ORDER — LIDOCAINE HCL (PF) 1 % IJ SOLN
INTRAMUSCULAR | Status: AC
  Filled 2023-02-17: qty 5

## 2023-02-17 NOTE — Progress Notes (Signed)
[facility] Progress Note   Assessment/ Plan:   1 Acute hypoxic RF: in the setting of flash pulm edema, hypertensive emergency, and nonadherence to BP meds.  HD 9/25, UF session today. 2 ESRD: actually MWF at [facility]- used to be TTS 3 Hypertension:  on nitroglycerin gtt, expect to improve with UF and will likely need adjustment in EDW and BP regimen 4. 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 Metabolic Bone Disease: binders when eating 6.  Dispo: in ICU/SD for now  Subjective:    Seen in room.  Still on nitroglycerin gtt, needs more HD.  Will do UF rx today.  D/w pt, in agreement.     Objective:   BP (!) 152/74   Pulse 88   Temp 98.5 F (36.9 C) (Oral)   Resp (!) 22   Ht 6' (1.829 m)   Wt 80.1 kg   SpO2 99%   BMI 23.95 kg/m   Physical Exam: GEN  NAD, sitting in bed, on O2 HEENT  EOMI  NECK + JVD PULM bibasilar crackles CV RRR ABD soft EXT 1+ tight LE edema NEURO nonfocal ACCESS:  L AVF + T/B    Labs: BMET Recent Labs  Lab 02/15/23 1935 02/15/23 1942 02/16/23 0512 02/17/23 0513  NA 131* 133* 135 132*  K 4.6 4.7 4.3 3.6  CL 92* 94* 96* 95*  CO2 24  --  22 27  GLUCOSE 378* 381* 93 141*  BUN 45* 41* 53* 29*  CREATININE 7.44* 8.30* 8.14* 6.10*  CALCIUM 8.0*  --  8.1* 8.1*  PHOS  --   --  5.2* 4.1   CBC Recent Labs  Lab 02/15/23 1935 02/15/23 1942 02/16/23 0512 02/17/23 0513  WBC 12.5*  --  9.7  --   NEUTROABS 9.6*  --   --   --   HGB 8.5* 10.2* 7.1* 6.8*  HCT 27.2* 30.0* 22.8* 20.6*  MCV 101.9*  --  100.9*  --   PLT 261  --  218  --       Medications:     sodium chloride   Intravenous Once   amLODipine  10 mg Oral Daily   calcitRIOL  0.5 mcg Oral Daily   Chlorhexidine Gluconate Cloth  6 each Topical Q0600   furosemide  80 mg Oral Daily   heparin  5,000 Units Subcutaneous Q8H   hydrALAZINE  75 mg Oral Q8H   influenza vaccine adjuvanted  0.5 mL Intramuscular Tomorrow-1000   insulin aspart  0-5 Units Subcutaneous QHS    insulin aspart  0-9 Units Subcutaneous TID WC   labetalol  200 mg Oral BID   melatonin  9 mg Oral QHS   pneumococcal 20-valent conjugate vaccine  0.5 mL Intramuscular Tomorrow-1000   sodium chloride flush  3 mL Intravenous Q12H     Bufford Buttner MD 02/17/2023, 10:12 AM

## 2023-02-17 NOTE — Progress Notes (Signed)
[facility] PROGRESS NOTE  Matthew Robles (DOB: 1955-12-25) IEP:[id number] PCP: [facility]  Brief Narrative: Matthew Robles is a 67 y.o. male with a history of ESRD (HD TTS), CVA, T2DM, HTN who presented to the ED on 02/15/2023 with respiratory distress found to have pulmonary edema and HTN urgency. Admitted on BiPAP and nitroglycerin gtt to ICU at [facility]. Nephrology consulted, dialysis arranged for the following morning. Respiratory status has improved considerably.   Subjective: Breathing is significantly improved, still on HFNC. No new complaints, no chest pain.   Objective: BP (!) 158/65   Pulse 91   Temp 98.8 F (37.1 C) (Axillary)   Resp (!) 24   Ht 6' (1.829 m)   Wt 80.1 kg   SpO2 94%   BMI 23.95 kg/m   Gen: No distress Neck: Nontender less well-defined subcutaneous mobile midline/slightly left of midline extension of density.  Pulm: Crackles bilaterally, nonlabored  CV: RRR, no MRG or pitting edema GI: Soft, NT, ND, +BS  Neuro: Alert and oriented. No new focal deficits. Ext: Warm, no deformities. +bruit LUE access. Skin: No rashes, lesions or ulcers on visualized skin   Assessment & Plan: Acute on chronic hypoxic respiratory failure: Has O2 at home.  - Weaned to 5L HFNC, no distress, aim to wean off oxygen (doesn't use oxygen regularly during the day). Will transfer to medical floor if able to liberate from NTG gtt.  - Continue empiric CTX, azithromycin with plans for 5 days empiric Tx. Blood culture drawn 9/25 though pt afebrile.  HTN with HTN urgency with demand myocardial ischemia: Unclear how much excess volume is driving this. Troponin down, no chest pain or ischemic features.  - Improving since admission, s/p 4.4L UF 9/25. Wean NTG with repeat HD. - Continue labetalol, hydralazine, lasix, norvasc.   Acute pulmonary edema, ESRD:  - Appreciate nephrology evaluation, HD planned 9/25. Plan additional HD 9/26 (would be back on his TTS  schedule anyway).   Neck "pressure": Does not appear to be an anginal equivalent, not exertional. 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 No signs/symptoms of SVC syndrome. TSH nl at 2.139.  - Soft tissue U/S.   T2DM: Well-controlled chronically with HbA1c 5.9%.  - SSI  - Reinforced that he needs regular ophthalmology exams as he reports some distortion that suggests macular degeneration.   Anemia of ESRD, iron deficiency:  - ESA, IV iron 9/25 per nephrology.  - Repeat hgb this AM 6.8g/dl. No bleeding, will transfuse 1u RBCs  Tyrone Nine, MD [facility] [email or web address] 02/17/2023, 9:20 AM

## 2023-02-17 NOTE — Plan of Care (Signed)

## 2023-02-17 NOTE — Progress Notes (Signed)
  HEMODIALYSIS TREATMENT NOTE:   3 hour isolated UF treatment ordered with 3-3.5 L goal.  Pre-HD: 191/91, MAP 126 with NTG infusing at 125 mcg/m.  spO2 96% on 5L O2 via HFNC.  AVF dressing from yesterday's treatment still in place.  When removed, prior cannulation sites began oozing blood.  New segment of AVF was cannulated (high on venous end) with 17g needle.  Max Qb 250.   NTG infusion had to be titrated up to as high as 275 mcg/min.  3 hour treatment completed.  All blood was returned.  Net UF 4.2 L.  Post-HD:  02/17/23 2110  Vitals  Temp 98.1 F (36.7 C)  Temp Source Oral  BP (!) 180/84  MAP (mmHg) 117  BP Location Right Arm  BP Method Automatic  Patient Position (if appropriate) Sitting  Pulse Rate 90  Pulse Rate Source Monitor  ECG Heart Rate 92  Resp 18  Oxygen Therapy  SpO2 91 %  O2 Device Room Air  Post Treatment  Dialyzer Clearance Lightly streaked  Hemodialysis Intake (mL) 0 mL  Liters Processed 37.5  Fluid Removed (mL) 4200 mL  Tolerated HD Treatment Yes  Post-Hemodialysis Comments Goal challenge met  AVG/AVF Arterial Site Held (minutes) 10 minutes  AVG/AVF Venous Site Held (minutes) 10 minutes  Fistula / Graft Left Forearm Arteriovenous fistula  Placement Date/Time: 07/13/22 1045   Orientation: Left  Access Location: Forearm  Access Type: (c) Arteriovenous fistula  Fistula / Graft Assessment Thrill;Bruit  Status Patent   Arman Filter, RN AP ICU4

## 2023-02-17 NOTE — Care Management Important Message (Signed)
Important Message  Patient Details  Name: Matthew Robles MRN: [id number] Date of Birth: 15-Sep-1955   Important Message Given:  N/A - LOS <3 / Initial given by admissions     Corey Harold 02/17/2023, 11:59 AM

## 2023-02-18 DIAGNOSIS — D638 Anemia in other chronic diseases classified elsewhere: Secondary | ICD-10-CM | POA: Diagnosis not present

## 2023-02-18 DIAGNOSIS — N186 End stage renal disease: Secondary | ICD-10-CM

## 2023-02-18 DIAGNOSIS — J9601 Acute respiratory failure with hypoxia: Secondary | ICD-10-CM | POA: Diagnosis not present

## 2023-02-18 DIAGNOSIS — R739 Hyperglycemia, unspecified: Secondary | ICD-10-CM | POA: Diagnosis not present

## 2023-02-18 DIAGNOSIS — I161 Hypertensive emergency: Secondary | ICD-10-CM

## 2023-02-18 LAB — RENAL FUNCTION PANEL
Albumin: 3.1 g/dL — ABNORMAL LOW (ref 3.5–5.0)
Anion gap: 14 (ref 5–15)
BUN: 36 mg/dL — ABNORMAL HIGH (ref 8–23)
CO2: 23 mmol/L (ref 22–32)
Calcium: 8.3 mg/dL — ABNORMAL LOW (ref 8.9–10.3)
Chloride: 92 mmol/L — ABNORMAL LOW (ref 98–111)
Creatinine, Ser: 8 mg/dL — ABNORMAL HIGH (ref 0.61–1.24)
GFR, Estimated: 7 mL/min — ABNORMAL LOW (ref >60)
Glucose, Bld: 123 mg/dL — ABNORMAL HIGH (ref 70–99)
Phosphorus: 5.2 mg/dL — ABNORMAL HIGH (ref 2.5–4.6)
Potassium: 3.8 mmol/L (ref 3.5–5.1)
Sodium: 129 mmol/L — ABNORMAL LOW (ref 135–145)

## 2023-02-18 LAB — CBC
HCT: 25.3 % — ABNORMAL LOW (ref 39.0–52.0)
Hemoglobin: 8.6 g/dL — ABNORMAL LOW (ref 13.0–17.0)
MCH: 33.1 pg (ref 26.0–34.0)
MCHC: 34 g/dL (ref 30.0–36.0)
MCV: 97.3 fL (ref 80.0–100.0)
Platelets: 259 10*3/uL (ref 150–400)
RBC: 2.6 MIL/uL — ABNORMAL LOW (ref 4.22–5.81)
RDW: 16.4 % — ABNORMAL HIGH (ref 11.5–15.5)
WBC: 10 10*3/uL (ref 4.0–10.5)
nRBC: 0 % (ref 0.0–0.2)

## 2023-02-18 LAB — BPAM RBC

## 2023-02-18 LAB — GLUCOSE, CAPILLARY
Glucose-Capillary: 103 mg/dL — ABNORMAL HIGH (ref 70–99)
Glucose-Capillary: 123 mg/dL — ABNORMAL HIGH (ref 70–99)

## 2023-02-18 MED ORDER — HYDRALAZINE HCL 25 MG PO TABS
100.0000 mg | ORAL_TABLET | Freq: Three times a day (TID) | ORAL | Status: DC
  Administered 2023-02-18 – 2023-02-20 (×6): 100 mg via ORAL
  Filled 2023-02-18 (×6): qty 4

## 2023-02-18 MED ORDER — LABETALOL HCL 200 MG PO TABS
300.0000 mg | ORAL_TABLET | Freq: Two times a day (BID) | ORAL | Status: DC
  Administered 2023-02-18 (×2): 300 mg via ORAL
  Filled 2023-02-18: qty 1
  Filled 2023-02-18: qty 2
  Filled 2023-02-18 (×6): qty 1

## 2023-02-18 NOTE — Progress Notes (Signed)
  HEMODIALYSIS TREATMENT NOTE:   HD performed in [facility] - pt has been off NTG since 1040 this morning. 3.5 hour heparin-free treatment completed using left upper arm AVF (15g/antegrade). 3L goal was nearly met.  UF was interrupted for the last 15 minutes of therapy due to leg cramps.  Hot packs given.  Now on room air and saturating 95%.  All blood was returned.  Net UF 2.8 liters.  Standing post weight 78.1 kg, 149/67.  Post-tx:  02/18/23 1730  Vitals  Temp 98 F (36.7 C)  Temp Source Oral  BP (!) 149/67  MAP (mmHg) 90  BP Location Right Arm  BP Method Automatic  Patient Position (if appropriate) Standing  Pulse Rate 87  Pulse Rate Source Monitor  ECG Heart Rate 86  Resp 18  Oxygen Therapy  SpO2 96 %  O2 Device Room Air  Post Treatment  Dialyzer Clearance Lightly streaked  Hemodialysis Intake (mL) 0 mL  Liters Processed 73  Fluid Removed (mL) 2800 mL  Tolerated HD Treatment Yes  Post-Hemodialysis Comments Goal nearly met.  UF paused last 15 minutes d/t leg cramping.  AVG/AVF Arterial Site Held (minutes) 10 minutes  AVG/AVF Venous Site Held (minutes) 10 minutes  Fistula / Graft Left Forearm Arteriovenous fistula  Placement Date/Time: 07/13/22 1045   Orientation: Left  Access Location: Forearm  Access Type: (c) Arteriovenous fistula  Fistula / Graft Assessment Thrill;Bruit  Status Patent    Arman Filter, RN AP K[facility]

## 2023-02-18 NOTE — Progress Notes (Signed)
PROGRESS NOTE  Matthew Robles ZOX:[id number] DOB: 1955-11-05   PCP: Elmer Picker [facility]  Patient is from: Home.  DOA: 02/15/2023 LOS: 3  Chief complaints Chief Complaint  Patient presents with   Respiratory Distress     Brief Narrative / Interim history:  67 y.o. male with a history of ESRD (HD TTS), CVA, T2DM, HTN who presented to the ED on 02/15/2023 with respiratory distress found to have pulmonary edema and hypertensive emergency.  Admitted on BiPAP and nitroglycerin gtt to ICU at [facility]. Nephrology consulted, and during dialysis on and off schedule with ultrafiltration.  Blood pressure remained stable.    Subjective: Seen and examined earlier this morning.  No major events overnight of this morning.  Blood pressure elevated but improved.  Had HD with ultrafiltration of 4.2 L overnight.  Plan for HD later today.  Remains on nitro drip.  Titrating antihypertensive meds  Objective: Vitals:   02/18/23 1230 02/18/23 1300 02/18/23 1311 02/18/23 1323  BP: (!) 168/89 (!) 156/76 (!) 156/76   Pulse: 92 84    Resp: 19 19    Temp:    98.2 F (36.8 C)  TempSrc:    Oral  SpO2: 94% 93%    Weight:      Height:        Examination:  GENERAL: No apparent distress.  Nontoxic. HEENT: MMM.  Vision and hearing grossly intact.  NECK: Supple.  No apparent JVD.  RESP:  No IWOB.  Fair aeration bilaterally. CVS:  RRR. Heart sounds normal.  ABD/GI/GU: BS+. Abd soft, NTND.  MSK/EXT:  Moves extremities. No apparent deformity. No edema.  SKIN: no apparent skin lesion or wound NEURO: Awake, alert and oriented appropriately.  No apparent focal neuro deficit. PSYCH: Calm. Normal affect.   Procedures:  None  Microbiology summarized: 9/24 COVID-19, influenza and RSV PCR nonreactive 9/24 MRSA PCR screen negative 9/24 blood culture with bacillus species in 1 out of 4 bottles (aerobic) 9/26-repeat blood culture NGTD  Assessment and plan: Principal Problem:   Acute respiratory  failure with hypoxia (HCC) Active Problems:   ESRD (end stage renal disease) (HCC)   Anemia of chronic disease   Acute respiratory failure (HCC)   Hyperglycemia  Acute on chronic hypoxic respiratory failure due to pulmonary edema/fluid overload and possible community-acquired pneumonia-patient uses oxygen intermittently at home.  Currently on 2 L saturating in mid 90s. -Continue nitro drip for blood pressure-wean as able -Continue ceftriaxone and azithromycin -Ultrafiltration with hemodialysis per nephrology  Hypertensive emergency: Markedly elevated blood pressure with pulmonary edema and elevated troponin.  Likely due to noncompliance with meds. -Increased hydralazine and labetalol -Continue Lasix and amlodipine -Continue nitro drip-wean as able -Ultrafiltration per nephrology -May benefit from outpatient sleep study    ESRD with fluid overload/pulmonary edema -Ultrafiltration per nephrology -Adjusted antihypertensive meds as above    Controlled DM-2: A1c 5.9%.  CBG within acceptable range. -Discontinue SSI and CBG monitoring  Anemia of ESRD, iron deficiency: H&H improved after 1 unit. Recent Labs    09/16/22 1053 09/17/22 0505 09/17/22 0756 09/18/22 0441 02/15/23 1935 02/15/23 1942 02/16/23 0512 02/17/23 0513 02/17/23 1428 02/18/23 0457  HGB 9.9* 6.7* 6.9* 8.7* 8.5* 10.2* 7.1* 6.8* 7.8* 8.6*  -ESA and IV iron per nephrology -Monitor H&H  Neck "pressure": Seems to have resolved.  Soft tissue ultrasound without significant finding.  Body mass index is 23.89 kg/m.           DVT prophylaxis:  heparin injection 5,000 Units Start: 02/15/23 2145 SCDs  Start: 02/15/23 2138  Code Status: Full code Family Communication: None at bedside Level of care: ICU Status is: Inpatient Remains inpatient appropriate because: Hypertensive emergency, fluid overload and ESRD   Final disposition: Home Consultants:  Nephro allergy     Sch Meds:  Scheduled Meds:   amLODipine  10 mg Oral Daily   calcitRIOL  0.5 mcg Oral Daily   Chlorhexidine Gluconate Cloth  6 each Topical Q0600   furosemide  80 mg Oral Daily   heparin  5,000 Units Subcutaneous Q8H   hydrALAZINE  100 mg Oral Q8H   influenza vaccine adjuvanted  0.5 mL Intramuscular Tomorrow-1000   insulin aspart  0-5 Units Subcutaneous QHS   insulin aspart  0-9 Units Subcutaneous TID WC   labetalol  300 mg Oral BID   melatonin  9 mg Oral QHS   pneumococcal 20-valent conjugate vaccine  0.5 mL Intramuscular Tomorrow-1000   sodium chloride flush  3 mL Intravenous Q12H   Continuous Infusions:  cefTRIAXone (ROCEPHIN)  IV 20 mL/hr at 02/17/23 2214   iron sucrose 420 mL/hr at 02/18/23 1227   methocarbamol (ROBAXIN) IV     nitroGLYCERIN Stopped (02/18/23 1040)   PRN Meds:.acetaminophen **OR** acetaminophen, guaiFENesin-dextromethorphan, heparin, hydrALAZINE, labetalol, lidocaine (PF), lidocaine-prilocaine, mouth rinse, pentafluoroprop-tetrafluoroeth, polyethylene glycol  Antimicrobials: Anti-infectives (From admission, onward)    Start     Dose/Rate Route Frequency Ordered Stop   02/15/23 2200  cefTRIAXone (ROCEPHIN) 1 g in sodium chloride 0.9 % 100 mL IVPB        1 g 200 mL/hr over 30 Minutes Intravenous Every 24 hours 02/15/23 2133 02/19/23 2359   02/15/23 2200  azithromycin (ZITHROMAX) 500 mg in sodium chloride 0.9 % 250 mL IVPB        500 mg 250 mL/hr over 60 Minutes Intravenous Every 24 hours 02/15/23 2133 02/17/23 2315        I have personally reviewed the following labs and images: CBC: Recent Labs  Lab 02/15/23 1935 02/15/23 1942 02/16/23 0512 02/17/23 0513 02/17/23 1428 02/18/23 0457  WBC 12.5*  --  9.7  --   --  10.0  NEUTROABS 9.6*  --   --   --   --   --   HGB 8.5* 10.2* 7.1* 6.8* 7.8* 8.6*  HCT 27.2* 30.0* 22.8* 20.6* 24.8* 25.3*  MCV 101.9*  --  100.9*  --   --  97.3  PLT 261  --  218  --   --  259   BMP &GFR Recent Labs  Lab 02/15/23 1935 02/15/23 1942  02/16/23 0512 02/17/23 0513 02/18/23 0457  NA 131* 133* 135 132* 129*  K 4.6 4.7 4.3 3.6 3.8  CL 92* 94* 96* 95* 92*  CO2 24  --  22 27 23   GLUCOSE 378* 381* 93 141* 123*  BUN 45* 41* 53* 29* 36*  CREATININE 7.44* 8.30* 8.14* 6.10* 8.00*  CALCIUM 8.0*  --  8.1* 8.1* 8.3*  MG 2.0  --   --   --   --   PHOS  --   --  5.2* 4.1 5.2*   Estimated Creatinine Clearance: 9.7 mL/min (A) (by C-G formula based on SCr of 8 mg/dL (H)). Liver & Pancreas: Recent Labs  Lab 02/15/23 1935 02/17/23 0513 02/18/23 0457  AST 29  --   --   ALT 31  --   --   ALKPHOS 93  --   --   BILITOT 1.9*  --   --   PROT 7.1  --   --  ALBUMIN 3.3* 2.9* 3.1*   No results for input(s): "LIPASE", "AMYLASE" in the last 168 hours. No results for input(s): "AMMONIA" in the last 168 hours. Diabetic: Recent Labs    02/15/23 2210  HGBA1C 5.8*   Recent Labs  Lab 02/17/23 1115 02/17/23 1647 02/17/23 2141 02/18/23 0722 02/18/23 1053  GLUCAP 121* 206* 106* 103* 123*   Cardiac Enzymes: No results for input(s): "CKTOTAL", "CKMB", "CKMBINDEX", "TROPONINI" in the last 168 hours. No results for input(s): "PROBNP" in the last 8760 hours. Coagulation Profile: Recent Labs  Lab 02/16/23 0512  INR 1.3*   Thyroid Function Tests: Recent Labs    02/17/23 0513  TSH 2.139   Lipid Profile: No results for input(s): "CHOL", "HDL", "LDLCALC", "TRIG", "CHOLHDL", "LDLDIRECT" in the last 72 hours. Anemia Panel: Recent Labs    02/16/23 0512  VITAMINB12 2,622*  FOLATE 7.7  FERRITIN 255  TIBC 239*  IRON 37*  RETICCTPCT 2.0   Urine analysis:    Component Value Date/Time   COLORURINE STRAW (A) 12/25/2020 2057   APPEARANCEUR CLEAR 12/25/2020 2057   LABSPEC 1.006 12/25/2020 2057   PHURINE 8.0 12/25/2020 2057   GLUCOSEU 50 (A) 12/25/2020 2057   HGBUR SMALL (A) 12/25/2020 2057   BILIRUBINUR NEGATIVE 12/25/2020 2057   KETONESUR NEGATIVE 12/25/2020 2057   PROTEINUR 30 (A) 12/25/2020 2057   NITRITE NEGATIVE  12/25/2020 2057   LEUKOCYTESUR NEGATIVE 12/25/2020 2057   Sepsis Labs: Invalid input(s): "PROCALCITONIN", "LACTICIDVEN"  Microbiology: Recent Results (from the past 240 hour(s))  Resp panel by RT-PCR (RSV, Flu A&B, Covid) Anterior Nasal Swab     Status: None   Collection Time: 02/15/23  7:35 PM   Specimen: Anterior Nasal Swab  Result Value Ref Range Status   SARS Coronavirus 2 by RT PCR NEGATIVE NEGATIVE Final    Comment: (NOTE) SARS-CoV-2 target nucleic acids are NOT DETECTED.  The SARS-CoV-2 RNA is generally detectable in upper respiratory specimens during the acute phase of infection. The lowest concentration of SARS-CoV-2 viral copies this assay can detect is 138 copies/mL. A negative result does not preclude SARS-Cov-2 infection and should not be used as the sole basis for treatment or other patient management decisions. A negative result may occur with  improper specimen collection/handling, submission of specimen other than nasopharyngeal swab, presence of viral mutation(s) within the areas targeted by this assay, and inadequate number of viral copies(<138 copies/mL). A negative result must be combined with clinical observations, patient history, and epidemiological information. The expected result is Negative.  Fact Sheet for Patients:  BloggerCourse.com  Fact Sheet for Healthcare Providers:  SeriousBroker.it  This test is no t yet approved or cleared by the [place] FDA and  has been authorized for detection and/or diagnosis of SARS-CoV-2 by FDA under an Emergency Use Authorization (EUA). This EUA will remain  in effect (meaning this test can be used) for the duration of the COVID-19 declaration under Section 564(b)(1) of the Act, 21 U.S.C.section 360bbb-3(b)(1), unless the authorization is terminated  or revoked sooner.       Influenza A by PCR NEGATIVE NEGATIVE Final   Influenza B by PCR NEGATIVE NEGATIVE  Final    Comment: (NOTE) The Xpert Xpress SARS-CoV-2/FLU/RSV plus assay is intended as an aid in the diagnosis of influenza from Nasopharyngeal swab specimens and should not be used as a sole basis for treatment. Nasal washings and aspirates are unacceptable for Xpert Xpress SARS-CoV-2/FLU/RSV testing.  Fact Sheet for Patients: BloggerCourse.com  Fact Sheet for Healthcare Providers: SeriousBroker.it  This  test is not yet approved or cleared by the [place] and has been authorized for detection and/or diagnosis of SARS-CoV-2 by FDA under an Emergency Use Authorization (EUA). This EUA will remain in effect (meaning this test can be used) for the duration of the COVID-19 declaration under Section 564(b)(1) of the Act, 21 U.S.C. section 360bbb-3(b)(1), unless the authorization is terminated or revoked.     Resp Syncytial Virus by PCR NEGATIVE NEGATIVE Final    Comment: (NOTE) Fact Sheet for Patients: BloggerCourse.com  Fact Sheet for Healthcare Providers: SeriousBroker.it  This test is not yet approved or cleared by the [place] FDA and has been authorized for detection and/or diagnosis of SARS-CoV-2 by FDA under an Emergency Use Authorization (EUA). This EUA will remain in effect (meaning this test can be used) for the duration of the COVID-19 declaration under Section 564(b)(1) of the Act, 21 U.S.C. section 360bbb-3(b)(1), unless the authorization is terminated or revoked.  Performed at [facility], [address] [place], [place] [address]   Culture, blood (Routine X 2) w Reflex to ID Panel     Status: None (Preliminary result)   Collection Time: 02/15/23 10:10 PM   Specimen: BLOOD RIGHT WRIST  Result Value Ref Range Status   Specimen Description BLOOD RIGHT WRIST  Final   Special Requests   Final    BOTTLES DRAWN AEROBIC AND ANAEROBIC Blood Culture results  may not be optimal due to an excessive volume of blood received in culture bottles   Culture   Final    NO GROWTH 3 DAYS Performed at [facility], [address], [place], [place] [address]    Report Status PENDING  Incomplete  Culture, blood (Routine X 2) w Reflex to ID Panel     Status: Abnormal   Collection Time: 02/15/23 10:10 PM   Specimen: BLOOD RIGHT HAND  Result Value Ref Range Status   Specimen Description   Final    BLOOD RIGHT HAND Performed at [facility], [address] [place], [place] [address]    Special Requests   Final    BOTTLES DRAWN AEROBIC AND ANAEROBIC Blood Culture adequate volume Performed at [facility], [address] [place], [place] [address]    Culture  Setup Time   Final    GRAM POSITIVE RODS AEROBIC BOTTLE ONLY CRITICAL RESULT CALLED TO, READ BACK BY AND VERIFIED WITH: JESSICA ELLER AT 2110 02/16/2023 BY Havery Moros Performed at [facility], [address] [place], [place] [address]    Culture (A)  Final    BACILLUS SPECIES Standardized susceptibility testing for this organism is not available. Performed at [facility], [address], [place], [place] [address]    Report Status 02/17/2023 FINAL  Final  MRSA Next Gen by PCR, Nasal     Status: None   Collection Time: 02/15/23 10:47 PM   Specimen: Nasal Mucosa; Nasal Swab  Result Value Ref Range Status   MRSA by PCR Next Gen NOT DETECTED NOT DETECTED Final    Comment: (NOTE) The GeneXpert MRSA Assay (FDA approved for NASAL specimens only), is one component of a comprehensive MRSA colonization surveillance program. It is not intended to diagnose MRSA infection nor to guide or monitor treatment for MRSA infections. Test performance is not FDA approved in patients less than 38 years old. Performed at [facility], [address] [place], [place] [address]   Culture, blood (single) w Reflex to ID Panel     Status: None (Preliminary result)  Collection Time: 02/17/23  5:13 AM   Specimen:  BLOOD  Result Value Ref Range Status   Specimen Description BLOOD RIGHT HAND  Final   Special Requests   Final    BOTTLES DRAWN AEROBIC AND ANAEROBIC Blood Culture results may not be optimal due to an excessive volume of blood received in culture bottles   Culture   Final    NO GROWTH 1 DAY Performed at [facility], [address], [place], [place] [address]    Report Status PENDING  Incomplete    Radiology Studies: No results found.  CRITICAL CARE Performed by: Almon Hercules   Total critical care time: 45 minutes  Critical care time was exclusive of separately billable procedures and treating other patients.  Critical care was necessary to treat or prevent imminent or life-threatening deterioration.  Critical care was time spent personally by me on the following activities: development of treatment plan with patient and/or surrogate as well as nursing, discussions with consultants, evaluation of patient's response to treatment, examination of patient, obtaining history from patient or surrogate, ordering and performing treatments and interventions, ordering and review of laboratory studies, ordering and review of radiographic studies, pulse oximetry and re-evaluation of patient's condition.   Christalyn Goertz T. Sheron Tallman [facility]  If 7PM-7AM, please contact night-coverage [email or web address] 02/18/2023, 1:36 PM

## 2023-02-18 NOTE — Progress Notes (Signed)
Matthew Robles Progress Note   Assessment/ Plan:   1 Acute hypoxic RF: in the setting of flash pulm edema, hypertensive emergency, and nonadherence to BP meds.  HD 9/25, UF session today. 2 ESRD: actually MWF at [facility]- used to be TTS.  HD on schedule today 3 Hypertension:  on nitroglycerin gtt, expect to improve with UF and will likely need adjustment in EDW and BP regimen.  Hydralazine and labetalol increased today 4. 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 Metabolic Bone Disease: binders when eating 6.  Dispo: in ICU/SD for now  Subjective:    Had UF rx last night.  Off gtt this AM.  For HD with healthy UF goal today.   Objective:   BP (!) 180/83   Pulse 99   Temp 98.6 F (37 C) (Oral)   Resp 14   Ht 6' (1.829 m)   Wt 79.9 kg   SpO2 94%   BMI 23.89 kg/m   Physical Exam: GEN  NAD, sitting in bed, on O2 HEENT  EOMI  NECK + JVD PULM bibasilar crackles CV RRR ABD soft EXT 1+ tight LE edema, improving NEURO nonfocal ACCESS:  L AVF + T/B    Labs: BMET Recent Labs  Lab 02/15/23 1935 02/15/23 1942 02/16/23 0512 02/17/23 0513 02/18/23 0457  NA 131* 133* 135 132* 129*  K 4.6 4.7 4.3 3.6 3.8  CL 92* 94* 96* 95* 92*  CO2 24  --  22 27 23   GLUCOSE 378* 381* 93 141* 123*  BUN 45* 41* 53* 29* 36*  CREATININE 7.44* 8.30* 8.14* 6.10* 8.00*  CALCIUM 8.0*  --  8.1* 8.1* 8.3*  PHOS  --   --  5.2* 4.1 5.2*   CBC Recent Labs  Lab 02/15/23 1935 02/15/23 1942 02/16/23 0512 02/17/23 0513 02/17/23 1428 02/18/23 0457  WBC 12.5*  --  9.7  --   --  10.0  NEUTROABS 9.6*  --   --   --   --   --   HGB 8.5*   < > 7.1* 6.8* 7.8* 8.6*  HCT 27.2*   < > 22.8* 20.6* 24.8* 25.3*  MCV 101.9*  --  100.9*  --   --  97.3  PLT 261  --  218  --   --  259   < > = values in this interval not displayed.      Medications:     amLODipine  10 mg Oral Daily   calcitRIOL  0.5 mcg Oral Daily   Chlorhexidine Gluconate Cloth  6 each Topical Q0600   furosemide  80 mg  Oral Daily   heparin  5,000 Units Subcutaneous Q8H   hydrALAZINE  100 mg Oral Q8H   influenza vaccine adjuvanted  0.5 mL Intramuscular Tomorrow-1000   insulin aspart  0-5 Units Subcutaneous QHS   insulin aspart  0-9 Units Subcutaneous TID WC   labetalol  300 mg Oral BID   melatonin  9 mg Oral QHS   pneumococcal 20-valent conjugate vaccine  0.5 mL Intramuscular Tomorrow-1000   sodium chloride flush  3 mL Intravenous Q12H     Bufford Buttner MD 02/18/2023, 11:37 AM

## 2023-02-18 NOTE — Care Management Important Message (Signed)
Important Message  Patient Details  Name: Deng Kemler MRN: [id number] Date of Birth: 1956-04-29   Important Message Given:  Yes - Medicare IM     Corey Harold 02/18/2023, 2:54 PM

## 2023-02-18 NOTE — Plan of Care (Signed)

## 2023-02-19 DIAGNOSIS — J9601 Acute respiratory failure with hypoxia: Secondary | ICD-10-CM | POA: Diagnosis not present

## 2023-02-19 DIAGNOSIS — D638 Anemia in other chronic diseases classified elsewhere: Secondary | ICD-10-CM | POA: Diagnosis not present

## 2023-02-19 DIAGNOSIS — N186 End stage renal disease: Secondary | ICD-10-CM | POA: Diagnosis not present

## 2023-02-19 DIAGNOSIS — R739 Hyperglycemia, unspecified: Secondary | ICD-10-CM | POA: Diagnosis not present

## 2023-02-19 LAB — CBC
HCT: 28.1 % — ABNORMAL LOW (ref 39.0–52.0)
Hemoglobin: 9.2 g/dL — ABNORMAL LOW (ref 13.0–17.0)
MCH: 32.4 pg (ref 26.0–34.0)
MCHC: 32.7 g/dL (ref 30.0–36.0)
MCV: 98.9 fL (ref 80.0–100.0)
Platelets: 287 10*3/uL (ref 150–400)
RBC: 2.84 MIL/uL — ABNORMAL LOW (ref 4.22–5.81)
RDW: 16.5 % — ABNORMAL HIGH (ref 11.5–15.5)
WBC: 8.6 10*3/uL (ref 4.0–10.5)
nRBC: 0 % (ref 0.0–0.2)

## 2023-02-19 LAB — RENAL FUNCTION PANEL
Albumin: 2.9 g/dL — ABNORMAL LOW (ref 3.5–5.0)
Anion gap: 10 (ref 5–15)
BUN: 22 mg/dL (ref 8–23)
CO2: 27 mmol/L (ref 22–32)
Calcium: 8.3 mg/dL — ABNORMAL LOW (ref 8.9–10.3)
Chloride: 97 mmol/L — ABNORMAL LOW (ref 98–111)
Creatinine, Ser: 5.82 mg/dL — ABNORMAL HIGH (ref 0.61–1.24)
GFR, Estimated: 10 mL/min — ABNORMAL LOW (ref >60)
Glucose, Bld: 96 mg/dL (ref 70–99)
Phosphorus: 4.5 mg/dL (ref 2.5–4.6)
Potassium: 3.3 mmol/L — ABNORMAL LOW (ref 3.5–5.1)
Sodium: 134 mmol/L — ABNORMAL LOW (ref 135–145)

## 2023-02-19 MED ORDER — POTASSIUM CHLORIDE CRYS ER 20 MEQ PO TBCR
40.0000 meq | EXTENDED_RELEASE_TABLET | Freq: Once | ORAL | Status: AC
  Administered 2023-02-19: 40 meq via ORAL
  Filled 2023-02-19: qty 2

## 2023-02-19 MED ORDER — LABETALOL HCL 200 MG PO TABS
400.0000 mg | ORAL_TABLET | Freq: Two times a day (BID) | ORAL | Status: DC
  Administered 2023-02-19 – 2023-02-20 (×3): 400 mg via ORAL
  Filled 2023-02-19 (×7): qty 2

## 2023-02-19 NOTE — Plan of Care (Signed)
  Problem: Coping: Goal: Ability to adjust to condition or change in health will improve Outcome: Progressing   Problem: Fluid Volume: Goal: Ability to maintain a balanced intake and output will improve Outcome: Progressing   Problem: Health Behavior/Discharge Planning: Goal: Ability to identify and utilize available resources and services will improve Outcome: Progressing   Problem: Nutritional: Goal: Maintenance of adequate nutrition will improve Outcome: Progressing   Problem: Skin Integrity: Goal: Risk for impaired skin integrity will decrease Outcome: Progressing   Problem: Tissue Perfusion: Goal: Adequacy of tissue perfusion will improve Outcome: Progressing   Problem: Clinical Measurements: Goal: Ability to maintain clinical measurements within normal limits will improve Outcome: Progressing Goal: Will remain free from infection Outcome: Progressing Goal: Respiratory complications will improve Outcome: Progressing Goal: Cardiovascular complication will be avoided Outcome: Progressing

## 2023-02-19 NOTE — Progress Notes (Signed)
PROGRESS NOTE  Matthew Robles WJX:[id number] DOB: [birth year]   PCP: Elmer Picker [facility]  Patient is from: Home.  DOA: 02/15/2023 LOS: 4  Chief complaints Chief Complaint  Patient presents with   Respiratory Distress     Brief Narrative / Interim history:  67 y.o. male with a history of ESRD (HD TTS), CVA, T2DM, HTN who presented to the ED on 02/15/2023 with respiratory distress found to have pulmonary edema and hypertensive emergency.  Admitted on BiPAP and nitroglycerin gtt to ICU at [facility]. Nephrology consulted.patient had dialysis on and off schedule with ultrafiltration.     Eventually, blood pressure improved with daily dialysis ultrafiltration from 9/25-9/27, and adjustment of antihypertensive meds.  He came off nitro drip and transferred out of ICU on 9/28.   Subjective: Seen and examined earlier this morning.  No major events overnight of this morning.  Nitro drip resumed last night due to elevated blood pressure.  He was off nitro drip as of 4 AM this morning.  BP in 140s and 60s.  Has no complaints.  Objective: Vitals:   02/19/23 0600 02/19/23 0752 02/19/23 1013 02/19/23 1116  BP: (!) 172/73  (!) 149/66   Pulse: 80  83   Resp: 13     Temp:  97.9 F (36.6 C)  98.3 F (36.8 C)  TempSrc:  Axillary  Oral  SpO2: 100%     Weight:      Height:        Examination:  GENERAL: No apparent distress.  Nontoxic. HEENT: MMM.  Vision and hearing grossly intact.  NECK: Supple.  No apparent JVD.  RESP:  No IWOB.  Fair aeration bilaterally. CVS:  RRR. Heart sounds normal.  ABD/GI/GU: BS+. Abd soft, NTND.  MSK/EXT:  Moves extremities. No apparent deformity. No edema.  SKIN: no apparent skin lesion or wound NEURO: Awake, alert and oriented appropriately.  No apparent focal neuro deficit. PSYCH: Calm. Normal affect.   Procedures:  None  Microbiology summarized: 9/24 COVID-19, influenza and RSV PCR nonreactive 9/24 MRSA PCR screen negative 9/24 blood  culture with bacillus species in 1 out of 4 bottles (aerobic) 9/26-repeat blood culture NGTD  Assessment and plan: Principal Problem:   Acute respiratory failure with hypoxia (HCC) Active Problems:   ESRD (end stage renal disease) (HCC)   Hypertensive emergency   Anemia of chronic disease   Acute respiratory failure (HCC)   Hyperglycemia  Acute on chronic hypoxic respiratory failure due to pulmonary edema/fluid overload and possible community-acquired pneumonia-patient uses oxygen intermittently at home.  Currently on room air saturating in 90s. -Continue ceftriaxone and azithromycin to complete 5 days course -Ultrafiltration with hemodialysis per nephrology -Incentive spirometry/OOB  Hypertensive emergency: Markedly elevated BP with pulmonary edema and elevated troponin.  Likely due to noncompliance with meds.  Blood pressure improved.  Off nitro drip. -Increased hydralazine from 75 to 100 mg 3 times daily on 9/27 -Increase labetalol from 200 mg to 300 mg twice daily on 9/27.  Increase further to 400 mg twice daily -Continue Lasix and amlodipine -HD/ultrafiltration per nephrology-so far removed 11.5 L -May benefit from outpatient sleep study  ESRD with fluid overload/pulmonary edema: On HD MWF.  Received daily dialysis with UF from 9/25-9/27 -HD/ultrafiltration per nephrology -Adjusted antihypertensive meds as above  Controlled DM-2: A1c 5.9%.  CBG within acceptable range. -Discontinued SSI and CBG monitoring  Anemia of ESRD, iron deficiency: H&H improved after 1 unit. Recent Labs    09/17/22 0505 09/17/22 0756 09/18/22 0441 02/15/23 1935 02/15/23 1942  02/16/23 0512 02/17/23 0513 02/17/23 1428 02/18/23 0457 02/19/23 0335  HGB 6.7* 6.9* 8.7* 8.5* 10.2* 7.1* 6.8* 7.8* 8.6* 9.2*  -ESA and IV iron per nephrology -Monitor H&H  Neck "pressure": Seems to have resolved.  Soft tissue ultrasound without significant finding.  Physical deconditioning -OOB, daily ambulation  and PT  Hypokalemia: K3.3 -P.o. KCl 40 x 1  Body mass index is 23.35 kg/m.           DVT prophylaxis:  heparin injection 5,000 Units Start: 02/15/23 2145 SCDs Start: 02/15/23 2138  Code Status: Full code Family Communication: None at bedside Level of care: Med-Surg Status is: Inpatient Remains inpatient appropriate because: Hypertensive emergency, fluid overload and ESRD   Final disposition: Home Consultants:  Nephrology     Sch Meds:  Scheduled Meds:  amLODipine  10 mg Oral Daily   calcitRIOL  0.5 mcg Oral Daily   Chlorhexidine Gluconate Cloth  6 each Topical Q0600   furosemide  80 mg Oral Daily   heparin  5,000 Units Subcutaneous Q8H   hydrALAZINE  100 mg Oral Q8H   influenza vaccine adjuvanted  0.5 mL Intramuscular Tomorrow-1000   labetalol  400 mg Oral BID   melatonin  9 mg Oral QHS   pneumococcal 20-valent conjugate vaccine  0.5 mL Intramuscular Tomorrow-1000   sodium chloride flush  3 mL Intravenous Q12H   Continuous Infusions:  cefTRIAXone (ROCEPHIN)  IV Stopped (02/18/23 2148)   iron sucrose 420 mL/hr at 02/19/23 0722   nitroGLYCERIN Stopped (02/19/23 0443)   PRN Meds:.acetaminophen **OR** acetaminophen, guaiFENesin-dextromethorphan, heparin, labetalol, lidocaine (PF), lidocaine-prilocaine, mouth rinse, pentafluoroprop-tetrafluoroeth, polyethylene glycol  Antimicrobials: Anti-infectives (From admission, onward)    Start     Dose/Rate Route Frequency Ordered Stop   02/15/23 2200  cefTRIAXone (ROCEPHIN) 1 g in sodium chloride 0.9 % 100 mL IVPB        1 g 200 mL/hr over 30 Minutes Intravenous Every 24 hours 02/15/23 2133 02/19/23 2359   02/15/23 2200  azithromycin (ZITHROMAX) 500 mg in sodium chloride 0.9 % 250 mL IVPB        500 mg 250 mL/hr over 60 Minutes Intravenous Every 24 hours 02/15/23 2133 02/17/23 2315        I have personally reviewed the following labs and images: CBC: Recent Labs  Lab 02/15/23 1935 02/15/23 1942 02/16/23 0512  02/17/23 0513 02/17/23 1428 02/18/23 0457 02/19/23 0335  WBC 12.5*  --  9.7  --   --  10.0 8.6  NEUTROABS 9.6*  --   --   --   --   --   --   HGB 8.5*   < > 7.1* 6.8* 7.8* 8.6* 9.2*  HCT 27.2*   < > 22.8* 20.6* 24.8* 25.3* 28.1*  MCV 101.9*  --  100.9*  --   --  97.3 98.9  PLT 261  --  218  --   --  259 287   < > = values in this interval not displayed.   BMP &GFR Recent Labs  Lab 02/15/23 1935 02/15/23 1942 02/16/23 0512 02/17/23 0513 02/18/23 0457 02/19/23 0335  NA 131* 133* 135 132* 129* 134*  K 4.6 4.7 4.3 3.6 3.8 3.3*  CL 92* 94* 96* 95* 92* 97*  CO2 24  --  22 27 23 27   GLUCOSE 378* 381* 93 141* 123* 96  BUN 45* 41* 53* 29* 36* 22  CREATININE 7.44* 8.30* 8.14* 6.10* 8.00* 5.82*  CALCIUM 8.0*  --  8.1* 8.1* 8.3* 8.3*  MG 2.0  --   --   --   --   --   PHOS  --   --  5.2* 4.1 5.2* 4.5   Estimated Creatinine Clearance: 13.3 mL/min (A) (by C-G formula based on SCr of 5.82 mg/dL (H)). Liver & Pancreas: Recent Labs  Lab 02/15/23 1935 02/17/23 0513 02/18/23 0457 02/19/23 0335  AST 29  --   --   --   ALT 31  --   --   --   ALKPHOS 93  --   --   --   BILITOT 1.9*  --   --   --   PROT 7.1  --   --   --   ALBUMIN 3.3* 2.9* 3.1* 2.9*   No results for input(s): "LIPASE", "AMYLASE" in the last 168 hours. No results for input(s): "AMMONIA" in the last 168 hours. Diabetic: No results for input(s): "HGBA1C" in the last 72 hours.  Recent Labs  Lab 02/17/23 1115 02/17/23 1647 02/17/23 2141 02/18/23 0722 02/18/23 1053  GLUCAP 121* 206* 106* 103* 123*   Cardiac Enzymes: No results for input(s): "CKTOTAL", "CKMB", "CKMBINDEX", "TROPONINI" in the last 168 hours. No results for input(s): "PROBNP" in the last 8760 hours. Coagulation Profile: Recent Labs  Lab 02/16/23 0512  INR 1.3*   Thyroid Function Tests: Recent Labs    02/17/23 0513  TSH 2.139   Lipid Profile: No results for input(s): "CHOL", "HDL", "LDLCALC", "TRIG", "CHOLHDL", "LDLDIRECT" in the last 72  hours. Anemia Panel: No results for input(s): "VITAMINB12", "FOLATE", "FERRITIN", "TIBC", "IRON", "RETICCTPCT" in the last 72 hours.  Urine analysis:    Component Value Date/Time   COLORURINE STRAW (A) 12/25/2020 2057   APPEARANCEUR CLEAR 12/25/2020 2057   LABSPEC 1.006 12/25/2020 2057   PHURINE 8.0 12/25/2020 2057   GLUCOSEU 50 (A) 12/25/2020 2057   HGBUR SMALL (A) 12/25/2020 2057   BILIRUBINUR NEGATIVE 12/25/2020 2057   KETONESUR NEGATIVE 12/25/2020 2057   PROTEINUR 30 (A) 12/25/2020 2057   NITRITE NEGATIVE 12/25/2020 2057   LEUKOCYTESUR NEGATIVE 12/25/2020 2057   Sepsis Labs: Invalid input(s): "PROCALCITONIN", "LACTICIDVEN"  Microbiology: Recent Results (from the past 240 hour(s))  Resp panel by RT-PCR (RSV, Flu A&B, Covid) Anterior Nasal Swab     Status: None   Collection Time: 02/15/23  7:35 PM   Specimen: Anterior Nasal Swab  Result Value Ref Range Status   SARS Coronavirus 2 by RT PCR NEGATIVE NEGATIVE Final    Comment: (NOTE) SARS-CoV-2 target nucleic acids are NOT DETECTED.  The SARS-CoV-2 RNA is generally detectable in upper respiratory specimens during the acute phase of infection. The lowest concentration of SARS-CoV-2 viral copies this assay can detect is 138 copies/mL. A negative result does not preclude SARS-Cov-2 infection and should not be used as the sole basis for treatment or other patient management decisions. A negative result may occur with  improper specimen collection/handling, submission of specimen other than nasopharyngeal swab, presence of viral mutation(s) within the areas targeted by this assay, and inadequate number of viral copies(<138 copies/mL). A negative result must be combined with clinical observations, patient history, and epidemiological information. The expected result is Negative.  Fact Sheet for Patients:  BloggerCourse.com  Fact Sheet for Healthcare Providers:   SeriousBroker.it  This test is no t yet approved or cleared by the [place] FDA and  has been authorized for detection and/or diagnosis of SARS-CoV-2 by FDA under an Emergency Use Authorization (EUA). This EUA will remain  in effect (meaning this test  can be used) for the duration of the COVID-19 declaration under Section 564(b)(1) of the Act, 21 U.S.C.section 360bbb-3(b)(1), unless the authorization is terminated  or revoked sooner.       Influenza A by PCR NEGATIVE NEGATIVE Final   Influenza B by PCR NEGATIVE NEGATIVE Final    Comment: (NOTE) The Xpert Xpress SARS-CoV-2/FLU/RSV plus assay is intended as an aid in the diagnosis of influenza from Nasopharyngeal swab specimens and should not be used as a sole basis for treatment. Nasal washings and aspirates are unacceptable for Xpert Xpress SARS-CoV-2/FLU/RSV testing.  Fact Sheet for Patients: BloggerCourse.com  Fact Sheet for Healthcare Providers: SeriousBroker.it  This test is not yet approved or cleared by the [place] FDA and has been authorized for detection and/or diagnosis of SARS-CoV-2 by FDA under an Emergency Use Authorization (EUA). This EUA will remain in effect (meaning this test can be used) for the duration of the COVID-19 declaration under Section 564(b)(1) of the Act, 21 U.S.C. section 360bbb-3(b)(1), unless the authorization is terminated or revoked.     Resp Syncytial Virus by PCR NEGATIVE NEGATIVE Final    Comment: (NOTE) Fact Sheet for Patients: BloggerCourse.com  Fact Sheet for Healthcare Providers: SeriousBroker.it  This test is not yet approved or cleared by the [place] FDA and has been authorized for detection and/or diagnosis of SARS-CoV-2 by FDA under an Emergency Use Authorization (EUA). This EUA will remain in effect (meaning this test can be used) for  the duration of the COVID-19 declaration under Section 564(b)(1) of the Act, 21 U.S.C. section 360bbb-3(b)(1), unless the authorization is terminated or revoked.  Performed at [facility], [address], [place], [place] [address]   Culture, blood (Routine X 2) w Reflex to ID Panel     Status: None (Preliminary result)   Collection Time: 02/15/23 10:10 PM   Specimen: BLOOD RIGHT WRIST  Result Value Ref Range Status   Specimen Description BLOOD RIGHT WRIST  Final   Special Requests   Final    BOTTLES DRAWN AEROBIC AND ANAEROBIC Blood Culture results may not be optimal due to an excessive volume of blood received in culture bottles   Culture   Final    NO GROWTH 4 DAYS Performed at [facility], [address], [place], [place] [address]    Report Status PENDING  Incomplete  Culture, blood (Routine X 2) w Reflex to ID Panel     Status: Abnormal   Collection Time: 02/15/23 10:10 PM   Specimen: BLOOD RIGHT HAND  Result Value Ref Range Status   Specimen Description   Final    BLOOD RIGHT HAND Performed at [facility], [address] [place], [place] [address]    Special Requests   Final    BOTTLES DRAWN AEROBIC AND ANAEROBIC Blood Culture adequate volume Performed at [facility], [address] [place], [place] [address]    Culture  Setup Time   Final    GRAM POSITIVE RODS AEROBIC BOTTLE ONLY CRITICAL RESULT CALLED TO, READ BACK BY AND VERIFIED WITH: JESSICA ELLER AT 2110 02/16/2023 BY Havery Moros Performed at [facility], [address] [place], [place] [address]    Culture (A)  Final    BACILLUS SPECIES Standardized susceptibility testing for this organism is not available. Performed at [facility], [address], [place], [place] [address]    Report Status 02/17/2023 FINAL  Final  MRSA Next Gen by PCR, Nasal     Status: None   Collection Time:  02/15/23 10:47 PM   Specimen: Nasal Mucosa; Nasal Swab  Result Value Ref Range Status   MRSA by PCR Next Gen NOT DETECTED  NOT DETECTED Final    Comment: (NOTE) The GeneXpert MRSA Assay (FDA approved for NASAL specimens only), is one component of a comprehensive MRSA colonization surveillance program. It is not intended to diagnose MRSA infection nor to guide or monitor treatment for MRSA infections. Test performance is not FDA approved in patients less than 65 years old. Performed at [facility], [address], [place], [place] [address]   Culture, blood (single) w Reflex to ID Panel     Status: None (Preliminary result)   Collection Time: 02/17/23  5:13 AM   Specimen: BLOOD  Result Value Ref Range Status   Specimen Description BLOOD RIGHT HAND  Final   Special Requests   Final    BOTTLES DRAWN AEROBIC AND ANAEROBIC Blood Culture results may not be optimal due to an excessive volume of blood received in culture bottles   Culture   Final    NO GROWTH 2 DAYS Performed at [facility], [address] [place], [place] [address]    Report Status PENDING  Incomplete    Radiology Studies: No results found.  CRITICAL CARE Performed by: Almon Hercules   Total critical care time: 45 minutes  Critical care time was exclusive of separately billable procedures and treating other patients.  Critical care was necessary to treat or prevent imminent or life-threatening deterioration.  Critical care was time spent personally by me on the following activities: development of treatment plan with patient and/or surrogate as well as nursing, discussions with consultants, evaluation of patient's response to treatment, examination of patient, obtaining history from patient or surrogate, ordering and performing treatments and interventions, ordering and review of laboratory studies, ordering and review of radiographic studies, pulse oximetry and re-evaluation of patient's condition.   Suzie Vandam T. Shonique Pelphrey [facility]  If 7PM-7AM, please contact night-coverage [email or web address] 02/19/2023, 3:57 PM

## 2023-02-20 DIAGNOSIS — N186 End stage renal disease: Secondary | ICD-10-CM | POA: Diagnosis not present

## 2023-02-20 DIAGNOSIS — J9601 Acute respiratory failure with hypoxia: Secondary | ICD-10-CM | POA: Diagnosis not present

## 2023-02-20 DIAGNOSIS — R739 Hyperglycemia, unspecified: Secondary | ICD-10-CM | POA: Diagnosis not present

## 2023-02-20 DIAGNOSIS — I161 Hypertensive emergency: Secondary | ICD-10-CM | POA: Diagnosis not present

## 2023-02-20 LAB — CULTURE, BLOOD (ROUTINE X 2): Culture: NO GROWTH

## 2023-02-20 LAB — MAGNESIUM: Magnesium: 1.9 mg/dL (ref 1.7–2.4)

## 2023-02-20 LAB — RENAL FUNCTION PANEL
Albumin: 3 g/dL — ABNORMAL LOW (ref 3.5–5.0)
Anion gap: 16 — ABNORMAL HIGH (ref 5–15)
BUN: 35 mg/dL — ABNORMAL HIGH (ref 8–23)
CO2: 23 mmol/L (ref 22–32)
Calcium: 8.8 mg/dL — ABNORMAL LOW (ref 8.9–10.3)
Chloride: 96 mmol/L — ABNORMAL LOW (ref 98–111)
Creatinine, Ser: 7.93 mg/dL — ABNORMAL HIGH (ref 0.61–1.24)
GFR, Estimated: 7 mL/min — ABNORMAL LOW (ref >60)
Glucose, Bld: 85 mg/dL (ref 70–99)
Phosphorus: 6 mg/dL — ABNORMAL HIGH (ref 2.5–4.6)
Potassium: 3.8 mmol/L (ref 3.5–5.1)
Sodium: 135 mmol/L (ref 135–145)

## 2023-02-20 LAB — CBC
HCT: 29.5 % — ABNORMAL LOW (ref 39.0–52.0)
Hemoglobin: 9.4 g/dL — ABNORMAL LOW (ref 13.0–17.0)
MCH: 32.1 pg (ref 26.0–34.0)
MCHC: 31.9 g/dL (ref 30.0–36.0)
MCV: 100.7 fL — ABNORMAL HIGH (ref 80.0–100.0)
Platelets: 314 10*3/uL (ref 150–400)
RBC: 2.93 MIL/uL — ABNORMAL LOW (ref 4.22–5.81)
RDW: 16.8 % — ABNORMAL HIGH (ref 11.5–15.5)
WBC: 8.4 10*3/uL (ref 4.0–10.5)
nRBC: 0 % (ref 0.0–0.2)

## 2023-02-20 MED ORDER — LABETALOL HCL 200 MG PO TABS: 400.0000 mg | ORAL_TABLET | Freq: Two times a day (BID) | ORAL | 0 refills | Status: DC

## 2023-02-20 NOTE — TOC Transition Note (Signed)
Transition of Care [facility]) - CM/SW Discharge Note   Patient Details  Name: Matthew Robles MRN: [id number] Date of Birth: 06-28-1955  [facility]) CM/SW Contact:  Catalina Gravel, LCSW Phone Number: 02/20/2023, 11:12 AM   Clinical Narrative:    Pt evaluated by PT no follow up recommended. DC today. No further TOC needs.      Barriers to Discharge: No Barriers Identified   Patient Goals and CMS Choice CMS Medicare.gov Compare Post Acute Care list provided to:: Patient Choice offered to / list presented to : Patient  Discharge Placement                         Discharge Plan and Services Additional resources added to the After Visit Summary for   In-house Referral: Clinical Social Work Discharge Planning Services: CM Consult                                 Social Determinants of Health (SDOH) Interventions SDOH Screenings   Food Insecurity: No Food Insecurity (02/15/2023)  Housing: Low Risk  (02/15/2023)  Transportation Needs: No Transportation Needs (02/15/2023)  Utilities: Not At Risk (02/15/2023)  Tobacco Use: Medium Risk (10/06/2022)  Health Literacy: Low Risk  (06/16/2021)   Received from [facility]     Readmission Risk Interventions    02/16/2023   12:14 PM 09/17/2022    9:27 AM 06/29/2022   12:02 PM  Readmission Risk Prevention Plan  Transportation Screening Complete Complete Complete  HRI or Home Care Consult  Complete Complete  Social Work Consult for Recovery Care Planning/Counseling  Complete Complete  Palliative Care Screening  Not Applicable Not Applicable  Medication Review Oceanographer) Complete Complete Complete  HRI or Home Care Consult Complete    SW Recovery Care/Counseling Consult Complete    Palliative Care Screening Not Applicable    Skilled Nursing Facility Not Applicable

## 2023-02-20 NOTE — Progress Notes (Signed)
Patient slept very little during this shift. AOx4. ADL's independent. No complaints of pain or discomfort. Plan of care ongoing.

## 2023-02-20 NOTE — Evaluation (Signed)
Physical Therapy Evaluation Patient Details Name: Matthew Robles MRN: [id number] DOB: January 07, 1956 Today's Date: 02/20/2023  History of Present Illness  Matthew Robles is a 67 y.o. male with medical history significant of end-stage renal disease as well as hypertension.  Patient feels that the combination of labetalol and amlodipine causes him to have stomach upset.  Therefore patient occasionally takes a break from his antihypertensive regimens.  Patient is currently on a break.  Patient reports having sinus congestion today.  But also developed marked sensation of shortness of breath that was present even at rest worse with exertion.  Prompting the patient to come to the ER.  There is no report of fever no report of chest pain.  Patient has been coughing due to his nasal congestion which is pretty dry.  There is no leg swelling.   Clinical Impression  Patient functioning near baseline for functional mobility and gait demonstrating good return for bed  mobility, transfers and ambulating in room/hallways without loss of balance.  Plan:  Patient discharged from physical therapy to care of nursing for ambulation daily as tolerated for length of stay.          If plan is discharge home, recommend the following: Other (comment) (near baseline)   Can travel by private vehicle        Equipment Recommendations None recommended by PT  Recommendations for Other Services       Functional Status Assessment       Precautions / Restrictions Precautions Precautions: None Restrictions Weight Bearing Restrictions: No      Mobility  Bed Mobility Overal bed mobility: Independent                  Transfers Overall transfer level: Independent                      Ambulation/Gait Ambulation/Gait assistance: Modified independent (Device/Increase time) Gait Distance (Feet): 200 Feet Assistive device: None Gait Pattern/deviations: WFL(Within Functional Limits), Drifts  right/left Gait velocity: slightly decreased     General Gait Details: grossly WFL with occasional drifting left/right mostly due to c/o numbness/neuropathy in feet, no loss of balanc and good return for ambulating in room and hallways  Stairs            Wheelchair Mobility     Tilt Bed    Modified Rankin (Stroke Patients Only)       Balance Overall balance assessment: Mild deficits observed, not formally tested                                           Pertinent Vitals/Pain Pain Assessment Pain Assessment: 0-10 Pain Score: 4  Pain Location: bottom of feet Pain Descriptors / Indicators: Numbness Pain Intervention(s): Monitored during session    Home Living Family/patient expects to be discharged to:: Private residence Living Arrangements: Parent Available Help at Discharge: Family Type of Home: House Home Access: Stairs to enter Entrance Stairs-Rails: Right Entrance Stairs-Number of Steps: 3   Home Layout: One level Home Equipment: None      Prior Function Prior Level of Function : Independent/Modified Independent;Driving             Mobility Comments: 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/driving independently ADLs Comments: independent     Extremity/Trunk Assessment   Upper Extremity Assessment Upper Extremity Assessment: Overall WFL for tasks assessed  Lower Extremity Assessment Lower Extremity Assessment: Overall WFL for tasks assessed    Cervical / Trunk Assessment Cervical / Trunk Assessment: Normal  Communication   Communication Communication: No apparent difficulties  Cognition Arousal: Alert Behavior During Therapy: WFL for tasks assessed/performed Overall Cognitive Status: Within Functional Limits for tasks assessed                                          General Comments      Exercises     Assessment/Plan    PT Assessment Patient does not need any further PT  services  PT Problem List         PT Treatment Interventions      PT Goals (Current goals can be found in the Care Plan section)  Acute Rehab PT Goals Patient Stated Goal: return home PT Goal Formulation: With patient Time For Goal Achievement: 02/20/23 Potential to Achieve Goals: Good    Frequency       Co-evaluation               AM-PAC PT "6 Clicks" Mobility  Outcome Measure Help needed turning from your back to your side while in a flat bed without using bedrails?: None Help needed moving from lying on your back to sitting on the side of a flat bed without using bedrails?: None Help needed moving to and from a bed to a chair (including a wheelchair)?: None Help needed standing up from a chair using your arms (e.g., wheelchair or bedside chair)?: None Help needed to walk in hospital room?: None Help needed climbing 3-5 steps with a railing? : None 6 Click Score: 24    End of Session   Activity Tolerance: Patient tolerated treatment well Patient left: in bed;with call bell/phone within reach Nurse Communication: Mobility status PT Visit Diagnosis: Unsteadiness on feet (R26.81);Other abnormalities of gait and mobility (R26.89);Muscle weakness (generalized) (M62.81)    Time: 1610-9604 PT Time Calculation (min) (ACUTE ONLY): 20 min   Charges:   PT Evaluation $PT Eval Moderate Complexity: 1 Mod PT Treatments $Therapeutic Activity: 8-22 mins PT General Charges $$ ACUTE PT VISIT: 1 Visit         9:34 AM, 02/20/23 Ocie Bob, MPT Physical Therapist with [facility] [phone] office [phone] mobile phone

## 2023-02-20 NOTE — Discharge Summary (Signed)
Physician Discharge Summary  Matthew Robles GLO:[id number] DOB: 01/07/1956 DOA: 02/15/2023  PCP: [facility]  Admit date: 02/15/2023 Discharge date: 02/20/2023 Admitted From: Home Disposition: Home Recommendations for Outpatient Follow-up:  Follow up with PCP in 1 week Reassess BP and fluid status at follow-up. Please follow up on the following pending results: None  Home Health: Not indicated Equipment/Devices: Not indicated  Discharge Condition: Stable CODE STATUS: Full code  Follow-up Information     [facility], Matthew Robles. Schedule an appointment as soon as possible for a visit in 1 week(s).   Contact information: [address] [place] [address] [phone]                 Hospital course 67 y.o. male with a history of ESRD (HD TTS), CVA, T2DM, HTN who presented to the ED on 02/15/2023 with respiratory distress found to have pulmonary edema and hypertensive emergency.  Admitted on BiPAP and nitroglycerin gtt to ICU at [facility]. Nephrology consulted.patient had dialysis on and off schedule with ultrafiltration.       Eventually, blood pressure improved with daily dialysis ultrafiltration and removal of 11.5 L from 9/25-9/27, and increment of his labetalol from 200 mg twice daily to 400 mg twice daily.  He came off nitro drip and transferred out of ICU on 9/28.  On the day of discharge, systolic BP in 140s and diastolic in 60s.  He is continued on home amlodipine and hydralazine and increased dose of labetalol.  He has blood pressure monitor at home.  Extensive discussion about appropriate BP measurements.  Encouraged to keep blood pressure log to take to PCP or nephrology at next follow-up.  He followed Coverage with CTX and Zithromax for 5 days as well.  See individual problem list below for more.   Problems addressed during this hospitalization Principal Problem:   Acute respiratory failure with hypoxia (HCC) Active  Problems:   ESRD (end stage renal disease) (HCC)   Hypertensive emergency   Anemia of chronic disease   Acute respiratory failure (HCC)   Hyperglycemia   Acute on chronic hypoxic respiratory failure due to pulmonary edema/fluid overload and possible community-acquired pneumonia-patient uses oxygen intermittently at home.  Completed CAP coverage with ceftriaxone and Zithromax for 5 days.  Had ultrafiltration with removal of 11.5 L over the course of 3 days.  Respiratory failure resolved.  Ambulated on room air without respiratory distress.  Hypertensive emergency: Markedly elevated BP with pulmonary edema and elevated troponin.  Likely due to noncompliance with meds.  BP improved with adjustment of antihypertensive meds and ultrafiltration. -Discharged on home amlodipine and hydralazine as well as increased dose of labetalol   ESRD with fluid overload/pulmonary edema: On HD MWF.  Received daily dialysis with UF from 9/25-9/27 -Patient to continue outpatient HD   Controlled DM-2: A1c 5.9%.  CBG within acceptable range. -Continue home meds   Anemia of ESRD, iron deficiency: H&H improved after 1 unit. -Per nephrology   Neck "pressure": Seems to have resolved.  Soft tissue ultrasound without significant finding.   Physical deconditioning: Resolved.   Hypokalemia: Resolved.           Time spent 35 minutes  Vital signs Vitals:   02/19/23 2108 02/20/23 0047 02/20/23 0515 02/20/23 0813  BP: (!) 155/68 135/68 (!) 161/85 (!) 147/61  Pulse: 85 76  80  Temp: 98.5 F (36.9 C) 98.4 F (36.9 C)    Resp: 20 18    Height:  Weight:      SpO2: 95% 98%    TempSrc:      BMI (Calculated):         Discharge exam  GENERAL: No apparent distress.  Nontoxic. HEENT: MMM.  Vision and hearing grossly intact.  NECK: Supple.  No apparent JVD.  RESP:  No IWOB.  Fair aeration bilaterally. CVS:  RRR. Heart sounds normal.  ABD/GI/GU: BS+. Abd soft, NTND.  MSK/EXT:  Moves extremities. No  apparent deformity. No edema.  SKIN: no apparent skin lesion or wound NEURO: Awake and alert. Oriented appropriately.  No apparent focal neuro deficit. PSYCH: Calm. Normal affect.   Discharge Instructions Discharge Instructions     Diet - low sodium heart healthy   Complete by: As directed    Discharge instructions   Complete by: As directed    It has been a pleasure taking care of you!  You were hospitalized due to markedly elevated blood pressure and difficulty breathing for which you have been treated with hemodialysis and blood pressure medication.  We made some changes to your blood pressure medication during this hospitalization.  Please review your new medication list and the directions on your medications before you take them.  Monitor your blood pressure and keep blood pressure log per our discussion. Hold your medication and call your doctor if your blood pressure is less than 100/60.  Recommend fluid restriction to less than 1200 cc a day.   Take care,   Increase activity slowly   Complete by: As directed       Allergies as of 02/20/2023   No Known Allergies      Medication List     TAKE these medications    amLODipine 10 MG tablet Commonly known as: NORVASC Take 1 tablet (10 mg total) by mouth daily.   calcitRIOL 0.5 MCG capsule Commonly known as: ROCALTROL Take 1 capsule (0.5 mcg total) by mouth daily.   calcium carbonate 500 MG chewable tablet Commonly known as: TUMS - dosed in mg elemental calcium Chew 2 tablets by mouth every dialysis for indigestion or heartburn.   FeroSul 325 (65 Fe) MG tablet Generic drug: ferrous sulfate Take 325 mg by mouth daily with breakfast.   furosemide 80 MG tablet Commonly known as: LASIX Take 1 tablet (80 mg total) by mouth daily.   hydrALAZINE 100 MG tablet Commonly known as: APRESOLINE Take 100 mg by mouth 3 (three) times daily.   labetalol 200 MG tablet Commonly known as: NORMODYNE Take 2 tablets (400 mg total)  by mouth 2 (two) times daily. What changed: how much to take   lidocaine-prilocaine cream Commonly known as: EMLA Apply 1 Application topically.        Consultations: Nephrology  Procedures/Studies: Hemodialysis from 9/25-9/27   US SOFT TISSUE HEAD & NECK (NON-THYROID)  Result Date: 02/17/2023 CLINICAL DATA:  Anterior neck mass for 7 years EXAM: ULTRASOUND OF HEAD/NECK SOFT TISSUES TECHNIQUE: Ultrasound examination of the head and neck soft tissues was performed in the area of clinical concern. COMPARISON:  None available FINDINGS: Targeted sonographic evaluation of the area of concern in the in the midline anterior neck demonstrates no discrete finding. IMPRESSION: No discrete sonographic abnormality identified in the region of concern in the anterior midline neck. Electronically Signed   By: Acquanetta Belling M.D.   On: 02/17/2023 17:15   DG Chest Port 1 View  Result Date: 02/15/2023 CLINICAL DATA:  dyspnea EXAM: PORTABLE CHEST 1 VIEW COMPARISON:  Chest x-ray 09/16/2022, CT chest 09/17/2022  FINDINGS: Interval removal of a right chest wall dialysis catheter. The heart and mediastinal contours are unchanged. Atherosclerotic plaque. Increased fluffy interstitial markings with Kerley B lines. A. No pleural effusion. No pneumothorax. No acute osseous abnormality. IMPRESSION: 1. Pulmonary edema. 2.  Aortic Atherosclerosis (ICD10-I70.0). Electronically Signed   By: Tish Frederickson M.D.   On: 02/15/2023 20:16       The results of significant diagnostics from this hospitalization (including imaging, microbiology, ancillary and laboratory) are listed below for reference.     Microbiology: Recent Results (from the past 240 hour(s))  Resp panel by RT-PCR (RSV, Flu A&B, Covid) Anterior Nasal Swab     Status: None   Collection Time: 02/15/23  7:35 PM   Specimen: Anterior Nasal Swab  Result Value Ref Range Status   SARS Coronavirus 2 by RT PCR NEGATIVE NEGATIVE Final    Comment:  (NOTE) SARS-CoV-2 target nucleic acids are NOT DETECTED.  The SARS-CoV-2 RNA is generally detectable in upper respiratory specimens during the acute phase of infection. The lowest concentration of SARS-CoV-2 viral copies this assay can detect is 138 copies/mL. A negative result does not preclude SARS-Cov-2 infection and should not be used as the sole basis for treatment or other patient management decisions. A negative result may occur with  improper specimen collection/handling, submission of specimen other than nasopharyngeal swab, presence of viral mutation(s) within the areas targeted by this assay, and inadequate number of viral copies(<138 copies/mL). A negative result must be combined with clinical observations, patient history, and epidemiological information. The expected result is Negative.  Fact Sheet for Patients:  BloggerCourse.com  Fact Sheet for Healthcare Providers:  SeriousBroker.it  This test is no t yet approved or cleared by the [place] FDA and  has been authorized for detection and/or diagnosis of SARS-CoV-2 by FDA under an Emergency Use Authorization (EUA). This EUA will remain  in effect (meaning this test can be used) for the duration of the COVID-19 declaration under Section 564(b)(1) of the Act, 21 U.S.C.section 360bbb-3(b)(1), unless the authorization is terminated  or revoked sooner.       Influenza A by PCR NEGATIVE NEGATIVE Final   Influenza B by PCR NEGATIVE NEGATIVE Final    Comment: (NOTE) The Xpert Xpress SARS-CoV-2/FLU/RSV plus assay is intended as an aid in the diagnosis of influenza from Nasopharyngeal swab specimens and should not be used as a sole basis for treatment. Nasal washings and aspirates are unacceptable for Xpert Xpress SARS-CoV-2/FLU/RSV testing.  Fact Sheet for Patients: BloggerCourse.com  Fact Sheet for Healthcare  Providers: SeriousBroker.it  This test is not yet approved or cleared by the [place] FDA and has been authorized for detection and/or diagnosis of SARS-CoV-2 by FDA under an Emergency Use Authorization (EUA). This EUA will remain in effect (meaning this test can be used) for the duration of the COVID-19 declaration under Section 564(b)(1) of the Act, 21 U.S.C. section 360bbb-3(b)(1), unless the authorization is terminated or revoked.     Resp Syncytial Virus by PCR NEGATIVE NEGATIVE Final    Comment: (NOTE) Fact Sheet for Patients: BloggerCourse.com  Fact Sheet for Healthcare Providers: SeriousBroker.it  This test is not yet approved or cleared by the [place] FDA and has been authorized for detection and/or diagnosis of SARS-CoV-2 by FDA under an Emergency Use Authorization (EUA). This EUA will remain in effect (meaning this test can be used) for the duration of the COVID-19 declaration under Section 564(b)(1) of the Act, 21 U.S.C. section 360bbb-3(b)(1), unless the authorization is  terminated or revoked.  Performed at [facility], [address], [place], [place] [address]   Culture, blood (Routine X 2) w Reflex to ID Panel     Status: None   Collection Time: 02/15/23 10:10 PM   Specimen: BLOOD RIGHT WRIST  Result Value Ref Range Status   Specimen Description BLOOD RIGHT WRIST  Final   Special Requests   Final    BOTTLES DRAWN AEROBIC AND ANAEROBIC Blood Culture results may not be optimal due to an excessive volume of blood received in culture bottles   Culture   Final    NO GROWTH 5 DAYS Performed at [facility], [address], [place], [place] [address]    Report Status 02/20/2023 FINAL  Final  Culture, blood (Routine X 2) w Reflex to ID Panel     Status: Abnormal   Collection Time: 02/15/23 10:10 PM   Specimen: BLOOD RIGHT HAND  Result Value Ref Range Status   Specimen Description    Final    BLOOD RIGHT HAND Performed at [facility], [address] [place], [place] [address]    Special Requests   Final    BOTTLES DRAWN AEROBIC AND ANAEROBIC Blood Culture adequate volume Performed at [facility], [address] [place], [place] [address]    Culture  Setup Time   Final    GRAM POSITIVE RODS AEROBIC BOTTLE ONLY CRITICAL RESULT CALLED TO, READ BACK BY AND VERIFIED WITH: JESSICA ELLER AT 2110 02/16/2023 BY Havery Moros Performed at [facility], [address] [place], [place] [address]    Culture (A)  Final    BACILLUS SPECIES Standardized susceptibility testing for this organism is not available. Performed at [facility], [address] [place], [place] [address]    Report Status 02/17/2023 FINAL  Final  MRSA Next Gen by PCR, Nasal     Status: None   Collection Time: 02/15/23 10:47 PM   Specimen: Nasal Mucosa; Nasal Swab  Result Value Ref Range Status   MRSA by PCR Next Gen NOT DETECTED NOT DETECTED Final    Comment: (NOTE) The GeneXpert MRSA Assay (FDA approved for NASAL specimens only), is one component of a comprehensive MRSA colonization surveillance program. It is not intended to diagnose MRSA infection nor to guide or monitor treatment for MRSA infections. Test performance is not FDA approved in patients less than 67 years old. Performed at [facility], [address], [place], [place] [address]   Culture, blood (single) w Reflex to ID Panel     Status: None (Preliminary result)   Collection Time: 02/17/23  5:13 AM   Specimen: BLOOD  Result Value Ref Range Status   Specimen Description BLOOD RIGHT HAND  Final   Special Requests   Final    BOTTLES DRAWN AEROBIC AND ANAEROBIC Blood Culture results may not be optimal due to an excessive volume of blood received in culture bottles   Culture   Final    NO GROWTH 3 DAYS Performed at [facility], [address], [place], [place] [address]    Report Status PENDING  Incomplete      Labs:  CBC: Recent Labs  Lab 02/15/23 1935 02/15/23 1942 02/16/23 0512 02/17/23 0513 02/17/23 1428 02/18/23 0457 02/19/23 0335 02/20/23 0409  WBC 12.5*  --  9.7  --   --  10.0 8.6 8.4  NEUTROABS 9.6*  --   --   --   --   --   --   --  HGB 8.5*   < > 7.1* 6.8* 7.8* 8.6* 9.2* 9.4*  HCT 27.2*   < > 22.8* 20.6* 24.8* 25.3* 28.1* 29.5*  MCV 101.9*  --  100.9*  --   --  97.3 98.9 100.7*  PLT 261  --  218  --   --  259 287 314   < > = values in this interval not displayed.   BMP &GFR Recent Labs  Lab 02/15/23 1935 02/15/23 1942 02/16/23 0512 02/17/23 0513 02/18/23 0457 02/19/23 0335 02/20/23 0409  NA 131*   < > 135 132* 129* 134* 135  K 4.6   < > 4.3 3.6 3.8 3.3* 3.8  CL 92*   < > 96* 95* 92* 97* 96*  CO2 24  --  22 27 23 27 23   GLUCOSE 378*   < > 93 141* 123* 96 85  BUN 45*   < > 53* 29* 36* 22 35*  CREATININE 7.44*   < > 8.14* 6.10* 8.00* 5.82* 7.93*  CALCIUM 8.0*  --  8.1* 8.1* 8.3* 8.3* 8.8*  MG 2.0  --   --   --   --   --  1.9  PHOS  --   --  5.2* 4.1 5.2* 4.5 6.0*   < > = values in this interval not displayed.   Estimated Creatinine Clearance: 9.8 mL/min (A) (by C-G formula based on SCr of 7.93 mg/dL (H)). Liver & Pancreas: Recent Labs  Lab 02/15/23 1935 02/17/23 0513 02/18/23 0457 02/19/23 0335 02/20/23 0409  AST 29  --   --   --   --   ALT 31  --   --   --   --   ALKPHOS 93  --   --   --   --   BILITOT 1.9*  --   --   --   --   PROT 7.1  --   --   --   --   ALBUMIN 3.3* 2.9* 3.1* 2.9* 3.0*   No results for input(s): "LIPASE", "AMYLASE" in the last 168 hours. No results for input(s): "AMMONIA" in the last 168 hours. Diabetic: No results for input(s): "HGBA1C" in the last 72 hours. Recent Labs  Lab 02/17/23 1115 02/17/23 1647 02/17/23 2141 02/18/23 0722 02/18/23 1053  GLUCAP 121* 206* 106* 103* 123*   Cardiac Enzymes: No results for input(s): "CKTOTAL", "CKMB", "CKMBINDEX", "TROPONINI" in the last 168 hours. No results for input(s):  "PROBNP" in the last 8760 hours. Coagulation Profile: Recent Labs  Lab 02/16/23 0512  INR 1.3*   Thyroid Function Tests: No results for input(s): "TSH", "T4TOTAL", "FREET4", "T3FREE", "THYROIDAB" in the last 72 hours. Lipid Profile: No results for input(s): "CHOL", "HDL", "LDLCALC", "TRIG", "CHOLHDL", "LDLDIRECT" in the last 72 hours. Anemia Panel: No results for input(s): "VITAMINB12", "FOLATE", "FERRITIN", "TIBC", "IRON", "RETICCTPCT" in the last 72 hours. Urine analysis:    Component Value Date/Time   COLORURINE STRAW (A) 12/25/2020 2057   APPEARANCEUR CLEAR 12/25/2020 2057   LABSPEC 1.006 12/25/2020 2057   PHURINE 8.0 12/25/2020 2057   GLUCOSEU 50 (A) 12/25/2020 2057   HGBUR SMALL (A) 12/25/2020 2057   BILIRUBINUR NEGATIVE 12/25/2020 2057   KETONESUR NEGATIVE 12/25/2020 2057   PROTEINUR 30 (A) 12/25/2020 2057   NITRITE NEGATIVE 12/25/2020 2057   LEUKOCYTESUR NEGATIVE 12/25/2020 2057   Sepsis Labs: Invalid input(s): "PROCALCITONIN", "LACTICIDVEN"   SIGNED:  Almon Hercules, MD  [facility] 02/20/2023, 4:31 PM

## 2023-02-22 LAB — CULTURE, BLOOD (SINGLE): Culture: NO GROWTH

## 2023-03-27 ENCOUNTER — Emergency Department (HOSPITAL_COMMUNITY): Payer: Medicare Other

## 2023-03-27 ENCOUNTER — Inpatient Hospital Stay (HOSPITAL_COMMUNITY)
Admission: EM | Admit: 2023-03-27 | Discharge: 2023-04-01 | DRG: 291 | Disposition: A | Discharge status: Home / Self Care | Payer: Medicare Other | Attending: Internal Medicine | Admitting: Internal Medicine

## 2023-03-27 ENCOUNTER — Other Ambulatory Visit: Payer: Self-pay

## 2023-03-27 ENCOUNTER — Encounter (HOSPITAL_COMMUNITY): Payer: Self-pay | Admitting: Emergency Medicine

## 2023-03-27 DIAGNOSIS — J9601 Acute respiratory failure with hypoxia: Principal | ICD-10-CM | POA: Diagnosis present

## 2023-03-27 DIAGNOSIS — D631 Anemia in chronic kidney disease: Secondary | ICD-10-CM | POA: Diagnosis present

## 2023-03-27 DIAGNOSIS — Z8249 Family history of ischemic heart disease and other diseases of the circulatory system: Secondary | ICD-10-CM

## 2023-03-27 DIAGNOSIS — J810 Acute pulmonary edema: Secondary | ICD-10-CM | POA: Diagnosis not present

## 2023-03-27 DIAGNOSIS — D72829 Elevated white blood cell count, unspecified: Secondary | ICD-10-CM | POA: Diagnosis present

## 2023-03-27 DIAGNOSIS — Z833 Family history of diabetes mellitus: Secondary | ICD-10-CM

## 2023-03-27 DIAGNOSIS — Z79899 Other long term (current) drug therapy: Secondary | ICD-10-CM

## 2023-03-27 DIAGNOSIS — Z87891 Personal history of nicotine dependence: Secondary | ICD-10-CM | POA: Diagnosis not present

## 2023-03-27 DIAGNOSIS — E1122 Type 2 diabetes mellitus with diabetic chronic kidney disease: Secondary | ICD-10-CM | POA: Diagnosis present

## 2023-03-27 DIAGNOSIS — I10 Essential (primary) hypertension: Secondary | ICD-10-CM | POA: Diagnosis not present

## 2023-03-27 DIAGNOSIS — E119 Type 2 diabetes mellitus without complications: Secondary | ICD-10-CM

## 2023-03-27 DIAGNOSIS — Z992 Dependence on renal dialysis: Secondary | ICD-10-CM | POA: Diagnosis not present

## 2023-03-27 DIAGNOSIS — D638 Anemia in other chronic diseases classified elsewhere: Secondary | ICD-10-CM | POA: Diagnosis not present

## 2023-03-27 DIAGNOSIS — I132 Hypertensive heart and chronic kidney disease with heart failure and with stage 5 chronic kidney disease, or end stage renal disease: Principal | ICD-10-CM | POA: Diagnosis present

## 2023-03-27 DIAGNOSIS — R6510 Systemic inflammatory response syndrome (SIRS) of non-infectious origin without acute organ dysfunction: Secondary | ICD-10-CM | POA: Diagnosis present

## 2023-03-27 DIAGNOSIS — Z91148 Patient's other noncompliance with medication regimen for other reason: Secondary | ICD-10-CM | POA: Diagnosis not present

## 2023-03-27 DIAGNOSIS — I5043 Acute on chronic combined systolic (congestive) and diastolic (congestive) heart failure: Secondary | ICD-10-CM | POA: Diagnosis present

## 2023-03-27 DIAGNOSIS — N186 End stage renal disease: Secondary | ICD-10-CM | POA: Diagnosis present

## 2023-03-27 DIAGNOSIS — M898X9 Other specified disorders of bone, unspecified site: Secondary | ICD-10-CM | POA: Diagnosis present

## 2023-03-27 DIAGNOSIS — R7989 Other specified abnormal findings of blood chemistry: Secondary | ICD-10-CM | POA: Diagnosis not present

## 2023-03-27 DIAGNOSIS — R042 Hemoptysis: Secondary | ICD-10-CM | POA: Diagnosis present

## 2023-03-27 DIAGNOSIS — Z8673 Personal history of transient ischemic attack (TIA), and cerebral infarction without residual deficits: Secondary | ICD-10-CM | POA: Diagnosis not present

## 2023-03-27 LAB — COMPREHENSIVE METABOLIC PANEL
ALT: 25 U/L (ref 0–44)
AST: 26 U/L (ref 15–41)
Albumin: 3.5 g/dL (ref 3.5–5.0)
Alkaline Phosphatase: 67 U/L (ref 38–126)
Anion gap: 16 — ABNORMAL HIGH (ref 5–15)
BUN: 71 mg/dL — ABNORMAL HIGH (ref 8–23)
CO2: 18 mmol/L — ABNORMAL LOW (ref 22–32)
Calcium: 8 mg/dL — ABNORMAL LOW (ref 8.9–10.3)
Chloride: 99 mmol/L (ref 98–111)
Creatinine, Ser: 8.55 mg/dL — ABNORMAL HIGH (ref 0.61–1.24)
GFR, Estimated: 6 mL/min — ABNORMAL LOW (ref >60)
Glucose, Bld: 209 mg/dL — ABNORMAL HIGH (ref 70–99)
Potassium: 3.6 mmol/L (ref 3.5–5.1)
Sodium: 133 mmol/L — ABNORMAL LOW (ref 135–145)
Total Bilirubin: 1.7 mg/dL — ABNORMAL HIGH (ref 0.3–1.2)
Total Protein: 7.3 g/dL (ref 6.5–8.1)

## 2023-03-27 LAB — I-STAT CHEM 8, ED
BUN: 74 mg/dL — ABNORMAL HIGH (ref 8–23)
Calcium, Ion: 1.01 mmol/L — ABNORMAL LOW (ref 1.15–1.40)
Chloride: 100 mmol/L (ref 98–111)
Creatinine, Ser: 9.5 mg/dL — ABNORMAL HIGH (ref 0.61–1.24)
Glucose, Bld: 206 mg/dL — ABNORMAL HIGH (ref 70–99)
HCT: 25 % — ABNORMAL LOW (ref 39.0–52.0)
Hemoglobin: 8.5 g/dL — ABNORMAL LOW (ref 13.0–17.0)
Potassium: 3.8 mmol/L (ref 3.5–5.1)
Sodium: 136 mmol/L (ref 135–145)
TCO2: 21 mmol/L — ABNORMAL LOW (ref 22–32)

## 2023-03-27 LAB — CBC
HCT: 24.2 % — ABNORMAL LOW (ref 39.0–52.0)
Hemoglobin: 8 g/dL — ABNORMAL LOW (ref 13.0–17.0)
MCH: 32.5 pg (ref 26.0–34.0)
MCHC: 33.1 g/dL (ref 30.0–36.0)
MCV: 98.4 fL (ref 80.0–100.0)
Platelets: 222 10*3/uL (ref 150–400)
RBC: 2.46 MIL/uL — ABNORMAL LOW (ref 4.22–5.81)
RDW: 15.1 % (ref 11.5–15.5)
WBC: 14.5 10*3/uL — ABNORMAL HIGH (ref 4.0–10.5)
nRBC: 0 % (ref 0.0–0.2)

## 2023-03-27 LAB — BLOOD GAS, ARTERIAL
Acid-Base Excess: 1.5 mmol/L (ref 0.0–2.0)
Bicarbonate: 24.7 mmol/L (ref 20.0–28.0)
Drawn by: 22223
O2 Saturation: 99.9 %
Patient temperature: 37
pCO2 arterial: 34 mmHg (ref 32–48)
pH, Arterial: 7.47 — ABNORMAL HIGH (ref 7.35–7.45)
pO2, Arterial: 297 mmHg — ABNORMAL HIGH (ref 83–108)

## 2023-03-27 LAB — BRAIN NATRIURETIC PEPTIDE: B Natriuretic Peptide: 2781 pg/mL — ABNORMAL HIGH (ref 0.0–100.0)

## 2023-03-27 LAB — TROPONIN I (HIGH SENSITIVITY)
Troponin I (High Sensitivity): 36 ng/L — ABNORMAL HIGH (ref <18)
Troponin I (High Sensitivity): 58 ng/L — ABNORMAL HIGH (ref <18)

## 2023-03-27 MED ORDER — FUROSEMIDE 10 MG/ML IJ SOLN
80.0000 mg | Freq: Once | INTRAMUSCULAR | Status: AC
Start: 2023-03-27 — End: 2023-03-27
  Administered 2023-03-27: 80 mg via INTRAVENOUS
  Filled 2023-03-27: qty 8

## 2023-03-27 MED ORDER — ONDANSETRON HCL 4 MG/2ML IJ SOLN: 4.0000 mg | Freq: Four times a day (QID) | INTRAMUSCULAR | Status: DC | PRN

## 2023-03-27 MED ORDER — ACETAMINOPHEN 325 MG PO TABS: 650.0000 mg | ORAL_TABLET | Freq: Four times a day (QID) | ORAL | Status: DC | PRN

## 2023-03-27 MED ORDER — ONDANSETRON HCL 4 MG PO TABS: 4.0000 mg | ORAL_TABLET | Freq: Four times a day (QID) | ORAL | Status: DC | PRN

## 2023-03-27 MED ORDER — HEPARIN SODIUM (PORCINE) 5000 UNIT/ML IJ SOLN
5000.0000 [IU] | Freq: Three times a day (TID) | INTRAMUSCULAR | Status: DC
  Administered 2023-03-28 – 2023-04-01 (×13): 5000 [IU] via SUBCUTANEOUS
  Filled 2023-03-27 (×13): qty 1

## 2023-03-27 MED ORDER — ACETAMINOPHEN 650 MG RE SUPP: 650.0000 mg | Freq: Four times a day (QID) | RECTAL | Status: DC | PRN

## 2023-03-27 MED ORDER — CHLORHEXIDINE GLUCONATE CLOTH 2 % EX PADS
6.0000 | MEDICATED_PAD | Freq: Every day | CUTANEOUS | Status: DC
  Administered 2023-03-30 – 2023-03-31 (×2): 6 via TOPICAL

## 2023-03-27 MED ORDER — INSULIN ASPART 100 UNIT/ML IJ SOLN
0.0000 [IU] | Freq: Three times a day (TID) | INTRAMUSCULAR | Status: DC
Start: 2023-03-28 — End: 2023-04-01
  Administered 2023-03-28: 2 [IU] via SUBCUTANEOUS
  Administered 2023-03-29 – 2023-04-01 (×4): 1 [IU] via SUBCUTANEOUS

## 2023-03-27 NOTE — Consult Note (Addendum)
Renal Service Consult Note [facility]  Matthew Robles 03/27/2023 Maree Krabbe, MD Requesting Physician: Dr. Maple Hudson  Reason for Consult: ESRD pt w/ resp distress HPI: The patient is a 67 y.o. year-old w/ PMH as below who presented to ED. In ED he was coughing pink frothy sputum and was placed on bipap. Pt w/ hx of DM2, h/o CVA, ESRD on HD at [facility]. We were called to see about dialysis. We recommended tx to [facility] for dialysis. Pt is on HD now this morning.  We are asked to see for ESRD.   Pt seen in HD unit. Pt is sleeping now on bipap still. No c/o's. SOB better. Unable to get much history w/ bipap in place. Will attempt later.   ROS - n/a   Past Medical History  Past Medical History:  Diagnosis Date   CHF (congestive heart failure) (HCC)    Chronic kidney disease    Diabetes mellitus without complication (HCC)    Hypertension    Stroke (HCC)    2017   Past Surgical History  Past Surgical History:  Procedure Laterality Date   AV FISTULA PLACEMENT Left 07/13/2022   Procedure: LEFT ARM ARTERIOVENOUS (AV) FISTULA CREATION;  Surgeon: [name], MD;  Location: [facility];  Service: Vascular;  Laterality: Left;   BASCILIC VEIN TRANSPOSITION Left 08/31/2022   Procedure: LEFT ARM SECOND STAGE BASILIC VEIN TRANSPOSITION;  Surgeon: [name], MD;  Location: [facility];  Service: Vascular;  Laterality: Left;   INSERTION OF DIALYSIS CATHETER Right 06/25/2022   Procedure: INSERTION OF DIALYSIS CATHETER;  Surgeon: Lucretia Roers, MD;  Location: [facility];  Service: General;  Laterality: Right;   WISDOM TOOTH EXTRACTION     Family History  Family History  Problem Relation Age of Onset   Diabetes Mother    Hypertension Mother    Diabetes Maternal Grandfather    Social History  reports that he quit smoking about 5 years ago. His smoking use included cigarettes. He has never used smokeless tobacco. He reports that he does not currently use alcohol after a past  usage of about 4.0 standard drinks of alcohol per week. He reports that he does not currently use drugs after having used the following drugs: Marijuana. Allergies No Known Allergies Home medications Prior to Admission medications   Medication Sig Start Date End Date Taking? Authorizing Provider  amLODipine (NORVASC) 10 MG tablet Take 1 tablet (10 mg total) by mouth daily. 09/18/22  Yes Shahmehdi, Seyed A, MD  calcium carbonate (TUMS - DOSED IN MG ELEMENTAL CALCIUM) 500 MG chewable tablet Chew 2 tablets by mouth every dialysis for indigestion or heartburn.   Yes [provider]  FEROSUL 325 (65 Fe) MG tablet Take 325 mg by mouth daily with breakfast. 08/18/22  Yes [provider]  furosemide (LASIX) 80 MG tablet Take 1 tablet (80 mg total) by mouth daily. 09/18/22  Yes Shahmehdi, Seyed A, MD  hydrALAZINE (APRESOLINE) 100 MG tablet Take 100 mg by mouth 3 (three) times daily. 02/07/23  Yes [provider]  labetalol (NORMODYNE) 200 MG tablet Take 2 tablets (400 mg total) by mouth 2 (two) times daily. 02/20/23 05/21/23 Yes Almon Hercules, MD  lidocaine-prilocaine (EMLA) cream Apply 1 Application topically. 09/21/22  Yes [provider]  magnesium gluconate (MAGONATE) 500 MG tablet Take 500 mg by mouth 2 (two) times daily.   Yes [provider]  vitamin B-12 (CYANOCOBALAMIN) 100 MCG tablet Take 100 mcg by mouth  daily.   Yes [provider]  calcitRIOL (ROCALTROL) 0.5 MCG capsule Take 1 capsule (0.5 mcg total) by mouth daily. Patient not taking: Reported on 03/27/2023 07/05/22   Catarina Hartshorn, MD     Vitals:   03/27/23 1831 03/27/23 1832 03/27/23 1845 03/27/23 1850  BP:  (!) 152/105 (!) 176/84   Pulse:  (!) 110 95   Resp:  (!) 23 (!) 24   Temp:    (!) 96.9 F (36.1 C)  TempSrc:    Axillary  SpO2: 99% (!) 86% 100%   Weight:      Height:       Exam Gen alert, mild ^wob , on bipap No rash, cyanosis or gangrene Sclera anicteric, throat clear  No jvd  or bruits Chest bilat crackles 1/3 up posterior RRR no RG Abd soft ntnd no mass or ascites +bs GU nl male MS no joint effusions or deformity Ext trace bilat LE edema, no wounds or ulcers Neuro is alert, Ox 3 , nf    L AVF +t/b    Renal-related home meds: - norvasc 10 - lasix 80qd - hydralazine 100 tid - labetalol 400 bid - rocaltrol 0.5 mcg qd    OP HD: [facility] MWF - pending details   Assessment/ Plan: Acute hypoxic RF - required bipap support on arrival at [facility] ED. Responded well to bipap. CXR c/w pulm edema. Pt transferred to [facility] overnight and is on HD this am now.  ESKD - on HD since feb 2024. Pt is MWF at [facility]. Last HD was Friday. Is on HD this am. Will reassess post -HD.  HTN - cont home BP meds as tolerated.  Volume - as above, vol excess most likely w/ pulm edema  Anemia of eskd: Hb 8-9 here, follow and transfuse prn.  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 Binders when eating.       Vinson Moselle  MD [facility] 03/27/2023, 8:40 PM  Recent Labs  Lab 03/27/23 1845 03/27/23 1945  HGB 8.0* 8.5*  ALBUMIN 3.5  --   CALCIUM 8.0*  --   CREATININE 8.55* 9.50*  K 3.6 3.8   Inpatient medications:

## 2023-03-27 NOTE — H&P (Signed)
History and Physical    Patient: Matthew Robles ZOX:[id number] DOB: Dec 07, 1955 DOA: 03/27/2023 DOS: the patient was seen and examined on 03/27/2023 PCP: [facility]  Patient coming from: Home  Chief Complaint:  Chief Complaint  Patient presents with   Respiratory Distress   HPI: Matthew Robles is a 67 y.o. male with medical history significant of hypertension, end-stage renal disease on hemodialysis (TTS), type 2 diabetes mellitus, history of stroke who presents to the emergency department from home via EMS due to respiratory distress.  Patient ran out of his Lasix within the last 2 to 3 days and started to complain of shortness of breath [place], complaining of pink frothy sputum.  EMS was activated and on arrival of EMS team, he was noted to be hypoxic and was placed on CPAP and transported to the ED for further evaluation and management.  Patient was admitted from 8/24 through 02/20/2023 due to similar presentation patient was diagnosed with acute on chronic respiratory failure with hypoxia due to pulmonary edema/fluid overload and possible community-acquired pneumonia as well as hypertensive emergency.  He was treated with ceftriaxone and Zithromax and 11.5 L was removed via ultrafiltration over the course of 3 days.  ED Course:  In the emergency department, temperature was 90 6.64F, BP 176/84, respiratory rate 24/min other vital signs are within normal range.  Workup in the ED showed leukocytosis, normocytic anemia.  BMP showed sodium 133, potassium 3.6, chloride 99, bicarb 18, blood glucose 209, BUN/creatinine 71/8.55, total bili 1.7.  Troponin 36 > 58.  BNP 2,781 Chest x-ray showed patchy bilateral lower lobe opacities, right greater than left, suspicious for pneumonia.  Suspected small right pleural effusion. IV Lasix 80 mg x 1 was given.  Patient states that last dialysis was on Friday (2 days ago).  Nephrology (Dr. Arlean Hopping)  was consulted and it was recommended for patient to be admitted to [facility] for dialysis.  [facility] was asked to admit patient for further evaluation and management.  Review of Systems: Review of systems as noted in the HPI. All other systems reviewed and are negative.   Past Medical History:  Diagnosis Date   CHF (congestive heart failure) (HCC)    Chronic kidney disease    Diabetes mellitus without complication (HCC)    Hypertension    Stroke (HCC)    2017   Past Surgical History:  Procedure Laterality Date   AV FISTULA PLACEMENT Left 07/13/2022   Procedure: LEFT ARM ARTERIOVENOUS (AV) FISTULA CREATION;  Surgeon: [name], MD;  Location: [facility] ORS;  Service: Vascular;  Laterality: Left;   BASCILIC VEIN TRANSPOSITION Left 08/31/2022   Procedure: LEFT ARM SECOND STAGE BASILIC VEIN TRANSPOSITION;  Surgeon: [name], MD;  Location: [facility]S;  Service: Vascular;  Laterality: Left;   INSERTION OF DIALYSIS CATHETER Right 06/25/2022   Procedure: INSERTION OF DIALYSIS CATHETER;  Surgeon: Lucretia Roers, MD;  Location: [facility] ORS;  Service: General;  Laterality: Right;   WISDOM TOOTH EXTRACTION      Social History:  reports that he quit smoking about 5 years ago. His smoking use included cigarettes. He has never used smokeless tobacco. He reports that he does not currently use alcohol after a past usage of about 4.0 standard drinks of alcohol per week. He reports that he does not currently use drugs after having used the following drugs: Marijuana.   No Known Allergies  Family History  Problem Relation Age of Onset  Diabetes Mother    Hypertension Mother    Diabetes Maternal Grandfather      Prior to Admission medications   Medication Sig Start Date End Date Taking? Authorizing Provider  amLODipine (NORVASC) 10 MG tablet Take 1 tablet (10 mg total) by mouth daily. 09/18/22  Yes Shahmehdi, Seyed A, MD  calcium carbonate (TUMS - DOSED IN MG ELEMENTAL CALCIUM) 500 MG chewable tablet Chew 2  tablets by mouth every dialysis for indigestion or heartburn.   Yes [provider]  FEROSUL 325 (65 Fe) MG tablet Take 325 mg by mouth daily with breakfast. 08/18/22  Yes [provider]  furosemide (LASIX) 80 MG tablet Take 1 tablet (80 mg total) by mouth daily. 09/18/22  Yes Shahmehdi, Seyed A, MD  hydrALAZINE (APRESOLINE) 100 MG tablet Take 100 mg by mouth 3 (three) times daily. 02/07/23  Yes [provider]  labetalol (NORMODYNE) 200 MG tablet Take 2 tablets (400 mg total) by mouth 2 (two) times daily. 02/20/23 05/21/23 Yes Almon Hercules, MD  lidocaine-prilocaine (EMLA) cream Apply 1 Application topically. 09/21/22  Yes [provider]  magnesium gluconate (MAGONATE) 500 MG tablet Take 500 mg by mouth 2 (two) times daily.   Yes [provider]  vitamin B-12 (CYANOCOBALAMIN) 100 MCG tablet Take 100 mcg by mouth daily.   Yes [provider]  calcitRIOL (ROCALTROL) 0.5 MCG capsule Take 1 capsule (0.5 mcg total) by mouth daily. Patient not taking: Reported on 03/27/2023 07/05/22   Catarina Hartshorn, MD    Physical Exam: BP (!) 176/84   Pulse 94   Temp (!) 97 F (36.1 C) (Oral)   Resp (!) 30   Ht 6' (1.829 m)   Wt 78 kg   SpO2 100%   BMI 23.32 kg/m   General: 67 y.o. year-old male well developed well nourished in no acute distress.  Alert and oriented x3. HEENT: NCAT, EOMI Neck: Supple, trachea medial Cardiovascular: Regular rate and rhythm with no rubs or gallops.  No thyromegaly or JVD noted.  No lower extremity edema. 2/4 pulses in all 4 extremities. Respiratory: Patient with diffuse Rales on auscultation.  He was on BiPAP with no wheezes or rales.  Abdomen: Soft, nontender nondistended with normal bowel sounds x4 quadrants. Muskuloskeletal: No cyanosis, clubbing or edema noted bilaterally Neuro: CN II-XII intact, strength 5/5 x 4, sensation, reflexes intact Skin: No ulcerative lesions noted or rashes Psychiatry: Judgement and insight appear  normal. Mood is appropriate for condition and setting          Labs on Admission:  Basic Metabolic Panel: Recent Labs  Lab 03/27/23 1845 03/27/23 1945  NA 133* 136  K 3.6 3.8  CL 99 100  CO2 18*  --   GLUCOSE 209* 206*  BUN 71* 74*  CREATININE 8.55* 9.50*  CALCIUM 8.0*  --    Liver Function Tests: Recent Labs  Lab 03/27/23 1845  AST 26  ALT 25  ALKPHOS 67  BILITOT 1.7*  PROT 7.3  ALBUMIN 3.5   No results for input(s): "LIPASE", "AMYLASE" in the last 168 hours. No results for input(s): "AMMONIA" in the last 168 hours. CBC: Recent Labs  Lab 03/27/23 1845 03/27/23 1945  WBC 14.5*  --   HGB 8.0* 8.5*  HCT 24.2* 25.0*  MCV 98.4  --   PLT 222  --    Cardiac Enzymes: No results for input(s): "CKTOTAL", "CKMB", "CKMBINDEX", "TROPONINI" in the last 168 hours.  BNP (last 3 results) Recent Labs  06/25/22 0427 02/15/23 1935 03/27/23 1845  BNP 2,536.0* >4,500.0* 2,781.0*    ProBNP (last 3 results) No results for input(s): "PROBNP" in the last 8760 hours.  CBG: No results for input(s): "GLUCAP" in the last 168 hours.  Radiological Exams on Admission: DG Chest Portable 1 View  Result Date: 03/27/2023 CLINICAL DATA:  Respiratory distress EXAM: PORTABLE CHEST 1 VIEW COMPARISON:  02/15/2023 FINDINGS: Patchy bilateral lower lobe opacities, right greater than left, suspicious for pneumonia. Asymmetric interstitial edema is considered unlikely. Suspected small right pleural effusion. No pneumothorax. Mild cardiomegaly. IMPRESSION: Patchy bilateral lower lobe opacities, right greater than left, suspicious for pneumonia. Suspected small right pleural effusion. Electronically Signed   By: Charline Bills M.D.   On: 03/27/2023 19:12    EKG: I independently viewed the EKG done and my findings are as followed: Sinus or ectopic atrial tachycardia at rate of 107 bpm  Assessment/Plan Present on Admission:  Acute on chronic combined systolic and diastolic CHF (congestive  heart failure) (HCC)  Elevated brain natriuretic peptide (BNP) level  Acute respiratory failure with hypoxia (HCC)  Anemia of chronic disease  Principal Problem:   Acute on chronic combined systolic and diastolic CHF (congestive heart failure) (HCC) Active Problems:   End-stage renal disease on hemodialysis (HCC)   Acute respiratory failure with hypoxia (HCC)   Anemia of chronic disease   Elevated brain natriuretic peptide (BNP) level   Elevated troponin   Controlled type 2 diabetes mellitus without complication, without long-term current use of insulin (HCC)   Essential hypertension  Acute on chronic combined systolic and diastolic CHF Elevated BNP (2781) This was possibly due to fluid overload Echocardiogram done in February 2024 showed LVEF of 45 to 50%.  LV demonstrates global hypokinesis.  Moderate concentric LVH.  G2 DD Continue total input/output, daily weights and fluid restriction Continue IV Lasix 80 mg x 1 was given Continue heart healthy/modified carb diet  Echocardiogram in the morning   Acute respiratory failure with hypoxia due to fluid overload Continue BiPAP with plan to wean patient off this as tolerated Nephrologist already consulted by [facility] EDP and dialysis on arrival to [facility] was planned  Elevated troponin possibly secondary to type II demand ischemia Troponin 36 > 58, patient denies chest pain Continue to trend troponin  Questionable CAP POA Chest x-ray was suggestive of CAP POA, however patient had no cough, chest congestion, fever Procalcitonin will be checked prior to starting patient on antibiotics  ESRD with fluid overload/pulmonary edema ( On HD MWF)   Nephrologist at [facility] was already consulted and recommended admitting patient to [facility] for dialysis.   Controlled DM-2 A1c 5.8% (9/24).   Continue ISS and hypoglycemia protocol   Anemia of chronic disease Continue ferrous sulfate Management per nephrology  Essential hypertension Continue  amlodipine, hydralazine  DVT prophylaxis: Heparin subcu   Advance Care Planning: CODE STATUS: Full code  Consults: Nephrology  Family Communication: None at bedside  Severity of Illness: The appropriate patient status for this patient is INPATIENT. Inpatient status is judged to be reasonable and necessary in order to provide the required intensity of service to ensure the patient's safety. The patient's presenting symptoms, physical exam findings, and initial radiographic and laboratory data in the context of their chronic comorbidities is felt to place them at high risk for further clinical deterioration. Furthermore, it is not anticipated that the patient will be medically stable for discharge from the hospital within 2 midnights of admission.   * I certify that  at the point of admission it is my clinical judgment that the patient will require inpatient hospital care spanning beyond 2 midnights from the point of admission due to high intensity of service, high risk for further deterioration and high frequency of surveillance required.*  Author: Frankey Shown, DO 03/27/2023 11:53 PM  For on call review [email or web address].

## 2023-03-27 NOTE — ED Triage Notes (Signed)
Per ems pt was in the 50 on 15 lpm at home. He has pink frothy sputum and placed on cpap. Pt's last dialysis was Friday.

## 2023-03-27 NOTE — ED Provider Notes (Signed)
[place] EMERGENCY DEPARTMENT AT [facility] Provider Note   CSN: [id number] Arrival date & time: 03/27/23  1828     History  Chief Complaint  Patient presents with   Respiratory Distress    Matthew Robles is a 67 y.o. male.  See ED course for initial HPI.  After patient respiratory status improved, he notes that he has been out of his Lasix for the past 2 to 3 days.  He was active [place] went home and roughly 2 hours prior to arrival began having shortness of breath.  No congestion, cough, chest pain, nausea vomiting diarrhea preceding symptoms.  Reports performing dialysis Friday for full session.        Home Medications Prior to Admission medications   Medication Sig Start Date End Date Taking? Authorizing Provider  amLODipine (NORVASC) 10 MG tablet Take 1 tablet (10 mg total) by mouth daily. 09/18/22  Yes Shahmehdi, Seyed A, MD  calcium carbonate (TUMS - DOSED IN MG ELEMENTAL CALCIUM) 500 MG chewable tablet Chew 2 tablets by mouth every dialysis for indigestion or heartburn.   Yes [provider]  FEROSUL 325 (65 Fe) MG tablet Take 325 mg by mouth daily with breakfast. 08/18/22  Yes [provider]  furosemide (LASIX) 80 MG tablet Take 1 tablet (80 mg total) by mouth daily. 09/18/22  Yes Shahmehdi, Seyed A, MD  hydrALAZINE (APRESOLINE) 100 MG tablet Take 100 mg by mouth 3 (three) times daily. 02/07/23  Yes [provider]  labetalol (NORMODYNE) 200 MG tablet Take 2 tablets (400 mg total) by mouth 2 (two) times daily. 02/20/23 05/21/23 Yes Almon Hercules, MD  lidocaine-prilocaine (EMLA) cream Apply 1 Application topically. 09/21/22  Yes [provider]  magnesium gluconate (MAGONATE) 500 MG tablet Take 500 mg by mouth 2 (two) times daily.   Yes [provider]  vitamin B-12 (CYANOCOBALAMIN) 100 MCG tablet Take 100 mcg by mouth daily.   Yes [provider]  calcitRIOL (ROCALTROL) 0.5 MCG  capsule Take 1 capsule (0.5 mcg total) by mouth daily. Patient not taking: Reported on 03/27/2023 07/05/22   Catarina Hartshorn, MD      Allergies    Patient has no known allergies.    Review of Systems   Review of Systems  Physical Exam Updated Vital Signs BP (!) 190/92   Pulse 85   Temp (!) 96.9 F (36.1 C) (Axillary)   Resp (!) 24   Ht 6' (1.829 m)   Wt 78 kg   SpO2 100%   BMI 23.32 kg/m  Physical Exam Vitals and nursing note reviewed.  Constitutional:      General: He is not in acute distress.    Appearance: He is not toxic-appearing.  HENT:     Head: Normocephalic.     Nose: Nose normal.     Mouth/Throat:     Mouth: Mucous membranes are moist.  Eyes:     Conjunctiva/sclera: Conjunctivae normal.  Cardiovascular:     Rate and Rhythm: Normal rate and regular rhythm.  Pulmonary:     Comments: Patient with increased work of breathing.  Diffuse crackles throughout all lung fields.  On CPAP.  Placed over on BiPAP and work of breathing improved. Abdominal:     General: Abdomen is flat. There is no distension.     Tenderness: There is no abdominal tenderness. There is no guarding or rebound.  Musculoskeletal:     Right lower leg: Edema present.  Left lower leg: Edema present.     Comments: Left upper extremity with fistula in place.  Good palpable thrill.  Skin:    General: Skin is warm and dry.     Capillary Refill: Capillary refill takes less than 2 seconds.  Neurological:     Mental Status: He is alert.  Psychiatric:        Mood and Affect: Mood normal.        Behavior: Behavior normal.     ED Results / Procedures / Treatments   Labs (all labs ordered are listed, but only abnormal results are displayed) Labs Reviewed  CBC - Abnormal; Notable for the following components:      Result Value   WBC 14.5 (*)    RBC 2.46 (*)    Hemoglobin 8.0 (*)    HCT 24.2 (*)    All other components within normal limits  BRAIN NATRIURETIC PEPTIDE - Abnormal; Notable for the  following components:   B Natriuretic Peptide 2,781.0 (*)    All other components within normal limits  COMPREHENSIVE METABOLIC PANEL - Abnormal; Notable for the following components:   Sodium 133 (*)    CO2 18 (*)    Glucose, Bld 209 (*)    BUN 71 (*)    Creatinine, Ser 8.55 (*)    Calcium 8.0 (*)    Total Bilirubin 1.7 (*)    GFR, Estimated 6 (*)    Anion gap 16 (*)    All other components within normal limits  I-STAT CHEM 8, ED - Abnormal; Notable for the following components:   BUN 74 (*)    Creatinine, Ser 9.50 (*)    Glucose, Bld 206 (*)    Calcium, Ion 1.01 (*)    TCO2 21 (*)    Hemoglobin 8.5 (*)    HCT 25.0 (*)    All other components within normal limits  TROPONIN I (HIGH SENSITIVITY) - Abnormal; Notable for the following components:   Troponin I (High Sensitivity) 36 (*)    All other components within normal limits  TROPONIN I (HIGH SENSITIVITY) - Abnormal; Notable for the following components:   Troponin I (High Sensitivity) 58 (*)    All other components within normal limits  HEPATITIS B SURFACE ANTIGEN  HEPATITIS B SURFACE ANTIBODY, QUANTITATIVE    EKG None  Radiology DG Chest Portable 1 View  Result Date: 03/27/2023 CLINICAL DATA:  Respiratory distress EXAM: PORTABLE CHEST 1 VIEW COMPARISON:  02/15/2023 FINDINGS: Patchy bilateral lower lobe opacities, right greater than left, suspicious for pneumonia. Asymmetric interstitial edema is considered unlikely. Suspected small right pleural effusion. No pneumothorax. Mild cardiomegaly. IMPRESSION: Patchy bilateral lower lobe opacities, right greater than left, suspicious for pneumonia. Suspected small right pleural effusion. Electronically Signed   By: Charline Bills M.D.   On: 03/27/2023 19:12    Procedures .Critical Care  Performed by: Coral Spikes, DO Authorized by: Coral Spikes, DO   Critical care provider statement:    Critical care time (minutes):  30   Critical care time was exclusive of:   Separately billable procedures and treating other patients and teaching time   Critical care was necessary to treat or prevent imminent or life-threatening deterioration of the following conditions:  Respiratory failure and circulatory failure   Critical care was time spent personally by me on the following activities:  Development of treatment plan with patient or surrogate, discussions with consultants, evaluation of patient's response to treatment, examination of patient, obtaining history from patient  or surrogate, ordering and performing treatments and interventions, ordering and review of laboratory studies, ordering and review of radiographic studies, pulse oximetry, re-evaluation of patient's condition and review of old charts   Care discussed with: admitting provider   Comments:     Hypoxic respiratory failure secondary to flash pulmonary edema from CHF/ESRD.     Medications Ordered in ED Medications  Chlorhexidine Gluconate Cloth 2 % PADS 6 each (has no administration in time range)  furosemide (LASIX) injection 80 mg (80 mg Intravenous Given 03/27/23 2026)    ED Course/ Medical Decision Making/ A&P Clinical Course as of 03/27/23 2212  Sun Mar 27, 2023  1843 Evaluated on arrival presented to the emergency traffic for acute respiratory distress on CPAP.  Reported acute onset of shortness of breath this evening, was found on the bathroom floor by family members with pink frothy sputum.  EMS reported patient hypoxic sats below 60% with blood pressure in the 200s.  Gave 1 sublingual nitro and placed patient on CPAP with improvement of oxygen saturation into the mid 80s.  Patient reports being out of Lasix for the past several days.  Denies any pain and reports breathing improved after interventions by EMS.  Will place patient on BiPAP and monitor.  Will likely dose Lasix.  Anticipate admission. [TY]  1922 DG Chest Portable 1 View Radiology reading as possible pneumonia, however clinical  picture fits with pulmonary edema. [TY]  2009 Spoke with nephrology.  They do not have ability to dialyze here at any [facility] recommending transfer to [facility]. [TY]    Clinical Course User Index [TY] Coral Spikes, DO                                 Medical Decision Making Is a 67 year old male presenting emergency department in emergency traffic for respiratory distress.  Hypoxic, hypertensive.  History of similar presentation.  Respiratory status is improved on BiPAP and appears much more comfortable.  Discussed case with nephrology; see ED course.  Discussed case with hospitalist who agrees to admit patient.   Amount and/or Complexity of Data Reviewed Independent Historian: EMS    Details: EMS reported hypoxia hypertension and giving nitro prior to arrival as well as CPAP.  Patient reportedly hypoxic into the 60s External Data Reviewed: notes.    Details: Per chart review recently admitted with similar presentation.  From prior DC summary"Hospital course 67 y.o. male with a history of ESRD (HD TTS), CVA, T2DM, HTN who presented to the ED on 02/15/2023 with respiratory distress found to have pulmonary edema and hypertensive emergency.  Admitted on BiPAP and nitroglycerin gtt to ICU at [facility]. Nephrology consulted.patient had dialysis on and off schedule with ultrafiltration.       Eventually, blood pressure improved with daily dialysis ultrafiltration and removal of 11.5 L from 9/25-9/27, and increment of his labetalol from 200 mg twice daily to 400 mg twice daily.  He came off nitro drip and transferred out of ICU on 9/28.   On the day of discharge, systolic BP in 140s and diastolic in 60s.  He is continued on home amlodipine and hydralazine and increased dose of labetalol.  He has blood pressure monitor at home.  Extensive discussion about appropriate BP measurements.  Encouraged to keep blood pressure log to take to PCP or nephrology at next follow-up.  He followed Coverage with [facility]X and  Zithromax for 5 days as well."  Labs: ordered. Decision-making details documented in ED Course.    Details: Labs with a leukocytosis which I suspect is reactionary.  Troponin elevated at 36, but EKG without ST segment changes to indicate ischemia.  Low suspicion for acute ACS.  BNP is elevated at 2781 this would support acute CHF/fluid overload from his ESRD.  Comprehensive metabolic panel with no elevated potassium.  BUN/creatinine consistent with his known ESRD. Radiology: ordered. Decision-making details documented in ED Course.    Details: See ED course ECG/medicine tests:     Details: See ED course  Risk Prescription drug management. Decision regarding hospitalization.          Final Clinical Impression(s) / ED Diagnoses Final diagnoses:  None    Rx / DC Orders ED Discharge Orders     None         Coral Spikes, DO 03/27/23 2212

## 2023-03-28 ENCOUNTER — Other Ambulatory Visit (HOSPITAL_COMMUNITY): Payer: Medicare Other

## 2023-03-28 DIAGNOSIS — I5043 Acute on chronic combined systolic (congestive) and diastolic (congestive) heart failure: Secondary | ICD-10-CM | POA: Diagnosis not present

## 2023-03-28 LAB — CBC WITH DIFFERENTIAL/PLATELET
Abs Immature Granulocytes: 0.08 10*3/uL — ABNORMAL HIGH (ref 0.00–0.07)
Basophils Absolute: 0.1 10*3/uL (ref 0.0–0.1)
Basophils Relative: 0 %
Eosinophils Absolute: 0.3 10*3/uL (ref 0.0–0.5)
Eosinophils Relative: 2 %
HCT: 23.6 % — ABNORMAL LOW (ref 39.0–52.0)
Hemoglobin: 7.8 g/dL — ABNORMAL LOW (ref 13.0–17.0)
Immature Granulocytes: 1 %
Lymphocytes Relative: 2 %
Lymphs Abs: 0.4 10*3/uL — ABNORMAL LOW (ref 0.7–4.0)
MCH: 32 pg (ref 26.0–34.0)
MCHC: 33.1 g/dL (ref 30.0–36.0)
MCV: 96.7 fL (ref 80.0–100.0)
Monocytes Absolute: 0.9 10*3/uL (ref 0.1–1.0)
Monocytes Relative: 6 %
Neutro Abs: 14.2 10*3/uL — ABNORMAL HIGH (ref 1.7–7.7)
Neutrophils Relative %: 89 %
Platelets: 178 10*3/uL (ref 150–400)
RBC: 2.44 MIL/uL — ABNORMAL LOW (ref 4.22–5.81)
RDW: 14.7 % (ref 11.5–15.5)
WBC: 15.8 10*3/uL — ABNORMAL HIGH (ref 4.0–10.5)
nRBC: 0 % (ref 0.0–0.2)

## 2023-03-28 LAB — RENAL FUNCTION PANEL
Albumin: 3.3 g/dL — ABNORMAL LOW (ref 3.5–5.0)
Anion gap: 15 (ref 5–15)
BUN: 77 mg/dL — ABNORMAL HIGH (ref 8–23)
CO2: 18 mmol/L — ABNORMAL LOW (ref 22–32)
Calcium: 8.3 mg/dL — ABNORMAL LOW (ref 8.9–10.3)
Chloride: 103 mmol/L (ref 98–111)
Creatinine, Ser: 9.5 mg/dL — ABNORMAL HIGH (ref 0.61–1.24)
GFR, Estimated: 6 mL/min — ABNORMAL LOW (ref >60)
Glucose, Bld: 115 mg/dL — ABNORMAL HIGH (ref 70–99)
Phosphorus: 5.1 mg/dL — ABNORMAL HIGH (ref 2.5–4.6)
Potassium: 3.9 mmol/L (ref 3.5–5.1)
Sodium: 136 mmol/L (ref 135–145)

## 2023-03-28 LAB — COMPREHENSIVE METABOLIC PANEL
ALT: UNDETERMINED U/L (ref 0–44)
AST: 25 U/L (ref 15–41)
Albumin: 3.2 g/dL — ABNORMAL LOW (ref 3.5–5.0)
Alkaline Phosphatase: 66 U/L (ref 38–126)
Anion gap: 17 — ABNORMAL HIGH (ref 5–15)
BUN: 79 mg/dL — ABNORMAL HIGH (ref 8–23)
CO2: 16 mmol/L — ABNORMAL LOW (ref 22–32)
Calcium: 8.2 mg/dL — ABNORMAL LOW (ref 8.9–10.3)
Chloride: 102 mmol/L (ref 98–111)
Creatinine, Ser: 9.7 mg/dL — ABNORMAL HIGH (ref 0.61–1.24)
GFR, Estimated: 5 mL/min — ABNORMAL LOW (ref >60)
Glucose, Bld: 149 mg/dL — ABNORMAL HIGH (ref 70–99)
Potassium: 3.7 mmol/L (ref 3.5–5.1)
Sodium: 135 mmol/L (ref 135–145)
Total Bilirubin: 1.5 mg/dL — ABNORMAL HIGH (ref <1.2)
Total Protein: 6.9 g/dL (ref 6.5–8.1)

## 2023-03-28 LAB — TROPONIN I (HIGH SENSITIVITY)
Troponin I (High Sensitivity): 82 ng/L — ABNORMAL HIGH (ref <18)
Troponin I (High Sensitivity): 104 ng/L (ref <18)
Troponin I (High Sensitivity): 102 ng/L (ref <18)

## 2023-03-28 LAB — CBC
HCT: 24.1 % — ABNORMAL LOW (ref 39.0–52.0)
Hemoglobin: 8 g/dL — ABNORMAL LOW (ref 13.0–17.0)
MCH: 31.9 pg (ref 26.0–34.0)
MCHC: 33.2 g/dL (ref 30.0–36.0)
MCV: 96 fL (ref 80.0–100.0)
Platelets: 223 10*3/uL (ref 150–400)
RBC: 2.51 MIL/uL — ABNORMAL LOW (ref 4.22–5.81)
RDW: 15 % (ref 11.5–15.5)
WBC: 17.6 10*3/uL — ABNORMAL HIGH (ref 4.0–10.5)
nRBC: 0 % (ref 0.0–0.2)

## 2023-03-28 LAB — PROCALCITONIN: Procalcitonin: 6.48 ng/mL

## 2023-03-28 LAB — GLUCOSE, CAPILLARY
Glucose-Capillary: 96 mg/dL (ref 70–99)
Glucose-Capillary: 219 mg/dL — ABNORMAL HIGH (ref 70–99)
Glucose-Capillary: 115 mg/dL — ABNORMAL HIGH (ref 70–99)

## 2023-03-28 LAB — PHOSPHORUS: Phosphorus: 5.5 mg/dL — ABNORMAL HIGH (ref 2.5–4.6)

## 2023-03-28 LAB — MAGNESIUM
Magnesium: UNDETERMINED mg/dL (ref 1.7–2.4)
Magnesium: 2.4 mg/dL (ref 1.7–2.4)

## 2023-03-28 LAB — ALT: ALT: 23 U/L (ref 0–44)

## 2023-03-28 LAB — HEPATITIS B SURFACE ANTIGEN: Hepatitis B Surface Ag: NONREACTIVE

## 2023-03-28 MED ORDER — HEPARIN SODIUM (PORCINE) 1000 UNIT/ML DIALYSIS: 2500.0000 [IU] | INTRAMUSCULAR | Status: DC | PRN

## 2023-03-28 MED ORDER — CALCIUM CARBONATE ANTACID 500 MG PO CHEW: 2.0000 | CHEWABLE_TABLET | ORAL | Status: DC | PRN

## 2023-03-28 MED ORDER — AMLODIPINE BESYLATE 10 MG PO TABS
10.0000 mg | ORAL_TABLET | Freq: Every day | ORAL | Status: DC
  Administered 2023-03-28 – 2023-04-01 (×4): 10 mg via ORAL
  Filled 2023-03-28 (×4): qty 1

## 2023-03-28 MED ORDER — FERROUS SULFATE 325 (65 FE) MG PO TABS
325.0000 mg | ORAL_TABLET | Freq: Every day | ORAL | Status: DC
  Administered 2023-03-29 – 2023-04-01 (×4): 325 mg via ORAL
  Filled 2023-03-28 (×4): qty 1

## 2023-03-28 MED ORDER — PENTAFLUOROPROP-TETRAFLUOROETH EX AERO: 1.0000 | INHALATION_SPRAY | CUTANEOUS | Status: DC | PRN

## 2023-03-28 MED ORDER — HEPARIN SODIUM (PORCINE) 1000 UNIT/ML DIALYSIS: 1000.0000 [IU] | INTRAMUSCULAR | Status: DC | PRN

## 2023-03-28 MED ORDER — ANTICOAGULANT SODIUM CITRATE 4% (200MG/5ML) IV SOLN: 5.0000 mL | Status: DC | PRN

## 2023-03-28 MED ORDER — NEPRO/CARBSTEADY PO LIQD: 237.0000 mL | ORAL | Status: DC | PRN

## 2023-03-28 MED ORDER — CHLORHEXIDINE GLUCONATE CLOTH 2 % EX PADS
6.0000 | MEDICATED_PAD | Freq: Every day | CUTANEOUS | Status: DC
  Administered 2023-03-29 – 2023-03-31 (×2): 6 via TOPICAL

## 2023-03-28 MED ORDER — LIDOCAINE-PRILOCAINE 2.5-2.5 % EX CREA: 1.0000 | TOPICAL_CREAM | CUTANEOUS | Status: DC | PRN

## 2023-03-28 MED ORDER — LIDOCAINE HCL (PF) 1 % IJ SOLN: 5.0000 mL | INTRAMUSCULAR | Status: DC | PRN

## 2023-03-28 MED ORDER — ALTEPLASE 2 MG IJ SOLR: 2.0000 mg | Freq: Once | INTRAMUSCULAR | Status: DC | PRN

## 2023-03-28 MED ORDER — VITAMIN B-12 100 MCG PO TABS
100.0000 ug | ORAL_TABLET | Freq: Every day | ORAL | Status: DC
  Administered 2023-03-29 – 2023-04-01 (×4): 100 ug via ORAL
  Filled 2023-03-28 (×4): qty 1

## 2023-03-28 MED ORDER — HEPARIN SODIUM (PORCINE) 1000 UNIT/ML DIALYSIS
2500.0000 [IU] | Freq: Once | INTRAMUSCULAR | Status: AC
  Administered 2023-03-28: 2500 [IU] via INTRAVENOUS_CENTRAL

## 2023-03-28 MED ORDER — MELATONIN 5 MG PO TABS
10.0000 mg | ORAL_TABLET | Freq: Once | ORAL | Status: AC
  Administered 2023-03-28: 10 mg via ORAL
  Filled 2023-03-28: qty 2

## 2023-03-28 MED ORDER — HYDRALAZINE HCL 50 MG PO TABS
100.0000 mg | ORAL_TABLET | Freq: Three times a day (TID) | ORAL | Status: DC
  Administered 2023-03-28 – 2023-04-01 (×13): 100 mg via ORAL
  Filled 2023-03-28 (×13): qty 2

## 2023-03-28 MED ORDER — HEPARIN SODIUM (PORCINE) 1000 UNIT/ML DIALYSIS
2500.0000 [IU] | INTRAMUSCULAR | Status: DC | PRN
  Filled 2023-03-28: qty 3

## 2023-03-28 MED ORDER — HEPARIN SODIUM (PORCINE) 1000 UNIT/ML DIALYSIS
2500.0000 [IU] | Freq: Once | INTRAMUSCULAR | Status: DC
  Filled 2023-03-28: qty 3

## 2023-03-28 NOTE — Progress Notes (Signed)
Pt receives out-pt HD at [facility] on MWF 1st shift. Clinic to fax information to inpt HD unit per nephrologist request. Will assist as needed.   Olivia Canter Renal Navigator [phone]

## 2023-03-28 NOTE — Progress Notes (Signed)
0430- Patient in resp distress with rr in the 30s, hr 130s, O2 90%, bp 183/89. Patient was placed on a NRB mask and respiratory is on her way.   2841- Dr. Joneen Roach was paged for bipap orders. Orders were placed.   3244- Patient on bipap. RR 24, hr 111, O2 97%. Patient is breathing more comfortably. Awaiting dialysis RN to come dialyze patient.

## 2023-03-28 NOTE — ED Notes (Signed)
[facility] present to provide Pt transport to [facility] at this time. Pt agreeable to transfer to [facility] to receive medical treatment.

## 2023-03-28 NOTE — Procedures (Signed)
I have reviewed the HD regimen and made appropriate changes.  Vinson Moselle MD  [facility] 03/28/2023, 10:21 AM

## 2023-03-28 NOTE — Progress Notes (Addendum)
AC called to come to talk to patient who refused to be transported to [facility] by [facility] for dialysis  At bedside, patient complained of his needs not being met.  He wants to know why he needs to go to [facility] and not just get his dialysis here.  It was explained to him that there is no dialysis here at [facility] until in the morning and his dialysis is by ultrafiltration which is available at [facility]. Patient was told that nephrologist at [facility] was already consulted and waiting for his arrival and that staying here at [facility] may be a delay of care.  Patient was encouraged to go to [facility] for the dialysis, since his symptoms are related to fluid overload. Patient eventually agreed to go to [facility].  Nursing department will rearrange for [facility] transport for patient.   Total time:  16 minutes This includes time reviewing the chart including progress notes, labs, EKGs, taking medical decisions, ordering labs and documenting findings.

## 2023-03-28 NOTE — Progress Notes (Signed)
RT transported pt from dialysis to 6E30 on bipap without any complications. RN at bedside.

## 2023-03-28 NOTE — ED Notes (Signed)
Hospitalist at bedside 

## 2023-03-28 NOTE — Progress Notes (Signed)
RT called by 6E RN stating RN in dialysis needing RT at bedside STAT. RT at bedside to find pt on 11L with SpO2 reading 85% and pt in no distress. RN stated that pts SpO2 was reading low 80's and increased O2. RT placed pt back on BiPAP with improvement to SpO2 now reading 97%. Dialysis RN given his RT contact number if needed.

## 2023-03-28 NOTE — Progress Notes (Signed)
Patient taken off of BiPAP and placed on HFNC. Pt maintaining 92% on 4LHFNC.

## 2023-03-28 NOTE — Progress Notes (Signed)
PROGRESS NOTE    Matthew Robles  VHQ:[id number] DOB: 1956-03-26 DOA: 03/27/2023 PCP: [facility]  Chief Complaint  Patient presents with   Respiratory Distress    Brief Narrative:   Matthew Robles is Matthew Robles 67 y.o. male with medical history significant of hypertension, end-stage renal disease on hemodialysis (TTS), type 2 diabetes mellitus, history of stroke who presents to the emergency department from home via EMS due to respiratory distress.  Patient ran out of his Lasix within the last 2 to 3 days and started to complain of shortness of breath [place], complaining of pink frothy sputum.  EMS was activated and on arrival of EMS team, he was noted to be hypoxic and was placed on CPAP and transported to the ED for further evaluation and management.   Patient was admitted from 8/24 through 02/20/2023 due to similar presentation patient was diagnosed with acute on chronic respiratory failure with hypoxia due to pulmonary edema/fluid overload and possible community-acquired pneumonia as well as hypertensive emergency. He was treated with ceftriaxone and Zithromax and 11.5 L was removed via ultrafiltration over the course of 3 days.   Assessment & Plan:   Principal Problem:   Acute on chronic combined systolic and diastolic CHF (congestive heart failure) (HCC) Active Problems:   End-stage renal disease on hemodialysis (HCC)   Acute respiratory failure with hypoxia (HCC)   Anemia of chronic disease   Elevated brain natriuretic peptide (BNP) level   Elevated troponin   Controlled type 2 diabetes mellitus without complication, without long-term current use of insulin (HCC)   Essential hypertension  Acute Hypoxic Respiratory Failure Volume Overload  Pulmonary Edema ESRD Currently on 4-5 L, was on BIPAP overnight CX with patchy bilateral lower lobe opacities, small R effusion S/p dialysis 11/4 He takes 80 mg PO lasix daily at  home Strict I/O, daily weights  Systemic Inflammatory Response Syndrome Leukocytosis, tachycardia, tachycardia Afebrile Lower suspicion for pneumonia, suspect presentation related to volume overload PCT is elevated, but ESRD Will follow blood cultures Consider additional workup, CT scan  Hemoptysis He notes chronically coughing up blood over past month or so Of note, this was noted 08/2022 admission, he had high resolution chest CT which showed pulm edema Pulm thought it was related to hypertension and volume overload at that time  Hypertension Amlodipine, hydralazine  T2DM SSI  AOCD Monitor    DVT prophylaxis: heparin Code Status: full Family Communication: renal Disposition:   Status is: Inpatient Remains inpatient appropriate because: continued inpatient need for hypoxia   Consultants:  renal  Procedures:  none  Antimicrobials:  Anti-infectives (From admission, onward)    None       Subjective: Feels better  Objective: Vitals:   03/28/23 1100 03/28/23 1111 03/28/23 1230 03/28/23 1232  BP: (!) 163/89 (!) 185/92  (!) 186/85  Pulse: 90 83  (!) 106  Resp: 18 20  20   Temp:  98.9 F (37.2 C)  98.6 F (37 C)  TempSrc:    Oral  SpO2: 100% 100%  91%  Weight:  74 kg 78.3 kg   Height:        Intake/Output Summary (Last 24 hours) at 03/28/2023 1315 Last data filed at 03/28/2023 1111 Gross per 24 hour  Intake --  Output 3300 ml  Net -3300 ml   Filed Weights   03/28/23 0858 03/28/23 1111 03/28/23 1230  Weight: 77 kg 74 kg 78.3 kg    Examination:  General exam: Appears calm  and comfortable  Respiratory system: diminished Cardiovascular system: RRR Central nervous system: Alert and oriented. No focal neurological deficits. Extremities: no LEE   Data Reviewed: I have personally reviewed following labs and imaging studies  CBC: Recent Labs  Lab 03/27/23 1845 03/27/23 1945 03/28/23 0556 03/28/23 0810  WBC 14.5*  --  17.6* 15.8*  NEUTROABS   --   --   --  14.2*  HGB 8.0* 8.5* 8.0* 7.8*  HCT 24.2* 25.0* 24.1* 23.6*  MCV 98.4  --  96.0 96.7  PLT 222  --  223 178    Basic Metabolic Panel: Recent Labs  Lab 03/27/23 1845 03/27/23 1945 03/28/23 0556 03/28/23 0810  NA 133* 136 135 136  K 3.6 3.8 3.7 3.9  CL 99 100 102 103  CO2 18*  --  16* 18*  GLUCOSE 209* 206* 149* 115*  BUN 71* 74* 79* 77*  CREATININE 8.55* 9.50* 9.70* 9.50*  CALCIUM 8.0*  --  8.2* 8.3*  MG  --   --  QUANTITY NOT SUFFICIENT, UNABLE TO PERFORM TEST 2.4  PHOS  --   --  5.5* 5.1*    GFR: Estimated Creatinine Clearance: 8.2 mL/min (Annita Ratliff) (by C-G formula based on SCr of 9.5 mg/dL (H)).  Liver Function Tests: Recent Labs  Lab 03/27/23 1845 03/28/23 0556 03/28/23 0810 03/28/23 0900  AST 26 25  --   --   ALT 25 QUANTITY NOT SUFFICIENT, UNABLE TO PERFORM TEST  --  23  ALKPHOS 67 66  --   --   BILITOT 1.7* 1.5*  --   --   PROT 7.3 6.9  --   --   ALBUMIN 3.5 3.2* 3.3*  --     CBG: Recent Labs  Lab 03/28/23 1140  GLUCAP 96     No results found for this or any previous visit (from the past 240 hour(s)).       Radiology Studies: DG Chest Portable 1 View  Result Date: 03/27/2023 CLINICAL DATA:  Respiratory distress EXAM: PORTABLE CHEST 1 VIEW COMPARISON:  02/15/2023 FINDINGS: Patchy bilateral lower lobe opacities, right greater than left, suspicious for pneumonia. Asymmetric interstitial edema is considered unlikely. Suspected small right pleural effusion. No pneumothorax. Mild cardiomegaly. IMPRESSION: Patchy bilateral lower lobe opacities, right greater than left, suspicious for pneumonia. Suspected small right pleural effusion. Electronically Signed   By: Charline Bills M.D.   On: 03/27/2023 19:12        Scheduled Meds:  amLODipine  10 mg Oral Daily   Chlorhexidine Gluconate Cloth  6 each Topical Q0600   Chlorhexidine Gluconate Cloth  6 each Topical Q0600   ferrous sulfate  325 mg Oral Q breakfast   heparin  5,000 Units  Subcutaneous Q8H   hydrALAZINE  100 mg Oral TID   insulin aspart  0-6 Units Subcutaneous TID WC   vitamin B-12  100 mcg Oral Daily   Continuous Infusions:   LOS: 1 day    Time spent: over 30 min    Lacretia Nicks, MD [facility]   To contact the attending provider between 7A-7P or the covering provider during after hours 7P-7A, please log into the web site [email or web address] and access using universal [place] password for that web site. If you do not have the password, please call the hospital operator.  03/28/2023, 1:15 PM

## 2023-03-28 NOTE — Progress Notes (Signed)
Patient arrived to the floor. Nephro was called regarding admission and provider stated that dialysis nurse will be dialyzing patient soon. Admission orders placed by [facility] provider. Patient arrived on a nrb mask and was transitioned to 13L HFNC sating in the 90s.

## 2023-03-28 NOTE — ED Notes (Signed)
Hospitalist paged to Pt bedside. Pt refusing to transport to [facility] via [place] at this time. AC and Conservation officer, historic buildings.

## 2023-03-28 NOTE — Plan of Care (Signed)

## 2023-03-28 NOTE — ED Notes (Signed)
Date and time results received: 03/28/23 0114   Test: Troponin Critical Value: 37  Name of Provider Notified: Thomes Dinning, DO

## 2023-03-28 NOTE — Progress Notes (Signed)
RT removed pt from BiPAP and placed pt on 4L [place]. Pt tolerating well at this time with no complications and no distress at this time. RN of pt notified.

## 2023-03-28 NOTE — Progress Notes (Signed)
   03/28/23 1135  BiPAP/CPAP/SIPAP  BiPAP/CPAP/SIPAP Pt Type Adult  BiPAP/CPAP/SIPAP SERVO (air)  Mask Type Full face mask  Mask Size Large  Set Rate 12 breaths/min  Respiratory Rate 21 breaths/min  IPAP (S)  10 cmH20 (pt pulling large Vt)  EPAP 5 cmH2O  FiO2 (%) 60 %  Minute Ventilation 10.5  Leak 17  Peak Inspiratory Pressure (PIP) 14  Tidal Volume (Vt) 803

## 2023-03-28 NOTE — Progress Notes (Signed)
   03/28/23 2027  BiPAP/CPAP/SIPAP  BiPAP/CPAP/SIPAP Pt Type Adult  BiPAP/CPAP/SIPAP SERVO  Reason BIPAP/CPAP not in use Other(comment) (pt doesnt wasnt to use it feels better)  Mask Type Full face mask   Pt refused bipap

## 2023-03-29 ENCOUNTER — Inpatient Hospital Stay (HOSPITAL_COMMUNITY): Payer: Medicare Other

## 2023-03-29 DIAGNOSIS — I5043 Acute on chronic combined systolic (congestive) and diastolic (congestive) heart failure: Secondary | ICD-10-CM | POA: Diagnosis not present

## 2023-03-29 LAB — ECHOCARDIOGRAM COMPLETE
AR max vel: 2.43 cm2
AV Area VTI: 2.44 cm2
AV Area mean vel: 2.38 cm2
AV Mean grad: 7 mm[Hg]
AV Peak grad: 12.7 mm[Hg]
Ao pk vel: 1.78 m/s
Area-P 1/2: 4.86 cm2
Height: 72 in
S' Lateral: 3.4 cm
Weight: 2700.8 [oz_av]

## 2023-03-29 LAB — COMPREHENSIVE METABOLIC PANEL
ALT: 19 U/L (ref 0–44)
AST: 25 U/L (ref 15–41)
Albumin: 2.9 g/dL — ABNORMAL LOW (ref 3.5–5.0)
Alkaline Phosphatase: 51 U/L (ref 38–126)
Anion gap: 12 (ref 5–15)
BUN: 59 mg/dL — ABNORMAL HIGH (ref 8–23)
CO2: 25 mmol/L (ref 22–32)
Calcium: 8.3 mg/dL — ABNORMAL LOW (ref 8.9–10.3)
Chloride: 98 mmol/L (ref 98–111)
Creatinine, Ser: 7.88 mg/dL — ABNORMAL HIGH (ref 0.61–1.24)
GFR, Estimated: 7 mL/min — ABNORMAL LOW (ref >60)
Glucose, Bld: 100 mg/dL — ABNORMAL HIGH (ref 70–99)
Potassium: 3.5 mmol/L (ref 3.5–5.1)
Sodium: 135 mmol/L (ref 135–145)
Total Bilirubin: 2.2 mg/dL — ABNORMAL HIGH (ref <1.2)
Total Protein: 6.4 g/dL — ABNORMAL LOW (ref 6.5–8.1)

## 2023-03-29 LAB — CBC WITH DIFFERENTIAL/PLATELET
Abs Immature Granulocytes: 0.03 10*3/uL (ref 0.00–0.07)
Basophils Absolute: 0.2 10*3/uL — ABNORMAL HIGH (ref 0.0–0.1)
Basophils Relative: 2 %
Eosinophils Absolute: 2 10*3/uL — ABNORMAL HIGH (ref 0.0–0.5)
Eosinophils Relative: 22 %
HCT: 21.6 % — ABNORMAL LOW (ref 39.0–52.0)
Hemoglobin: 7.3 g/dL — ABNORMAL LOW (ref 13.0–17.0)
Immature Granulocytes: 0 %
Lymphocytes Relative: 9 %
Lymphs Abs: 0.9 10*3/uL (ref 0.7–4.0)
MCH: 32.3 pg (ref 26.0–34.0)
MCHC: 33.8 g/dL (ref 30.0–36.0)
MCV: 95.6 fL (ref 80.0–100.0)
Monocytes Absolute: 0.8 10*3/uL (ref 0.1–1.0)
Monocytes Relative: 9 %
Neutro Abs: 5.5 10*3/uL (ref 1.7–7.7)
Neutrophils Relative %: 58 %
Platelets: 168 10*3/uL (ref 150–400)
RBC: 2.26 MIL/uL — ABNORMAL LOW (ref 4.22–5.81)
RDW: 14.9 % (ref 11.5–15.5)
WBC: 9.4 10*3/uL (ref 4.0–10.5)
nRBC: 0 % (ref 0.0–0.2)

## 2023-03-29 LAB — GLUCOSE, CAPILLARY
Glucose-Capillary: 84 mg/dL (ref 70–99)
Glucose-Capillary: 136 mg/dL — ABNORMAL HIGH (ref 70–99)
Glucose-Capillary: 155 mg/dL — ABNORMAL HIGH (ref 70–99)
Glucose-Capillary: 101 mg/dL — ABNORMAL HIGH (ref 70–99)

## 2023-03-29 LAB — HEPATITIS B SURFACE ANTIBODY, QUANTITATIVE: Hep B S AB Quant (Post): <3.5 m[IU]/mL — ABNORMAL LOW

## 2023-03-29 LAB — MAGNESIUM: Magnesium: 2.2 mg/dL (ref 1.7–2.4)

## 2023-03-29 LAB — PHOSPHORUS: Phosphorus: 5 mg/dL — ABNORMAL HIGH (ref 2.5–4.6)

## 2023-03-29 NOTE — Progress Notes (Signed)
Patient ID: Matthew Robles, male   DOB: 02-15-1956, 67 y.o.   MRN: [id number] [facility] Progress Note   Assessment/ Plan:   1.  Acute hypoxic respiratory failure: Secondary to pulmonary edema/volume overload in patient with ESRD.  Transitioned off of NIPPV and remains on oxygen via nasal cannula with attempts at volume unloading with hemodialysis.  Restarted oral furosemide. 2. ESRD: Usually on Monday/Wednesday/Friday dialysis schedule and underwent hemodialysis yesterday for volume unloading. 3. Anemia: Low hemoglobin/hematocrit without overt loss.  Continue to monitor on ESA. 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 5. Nutrition: Resume renal diet with fluid restriction and ongoing protein supplementation. 6. Hypertension: Blood pressure acceptable, continue to monitor with hemodialysis/UF  Subjective:   Reports that his breathing is better but still not back to baseline.  Had some mild cramps at dialysis yesterday   Objective:   BP (!) 138/56 (BP Location: Right Arm)   Pulse 82   Temp 98.6 F (37 C) (Oral)   Resp 19   Ht 6' (1.829 m)   Wt 76.6 kg   SpO2 93%   BMI 22.89 kg/m   Physical Exam: Gen: Comfortably sitting up in bed, on oxygen via nasal cannula CVS: Pulse regular rhythm, normal rate, S1 and S2 normal Resp: Fine rales left base otherwise clear to auscultation, no wheeze Abd: Soft, flat, nontender, bowel sounds normal Ext: No lower extremity edema.  Left upper arm AV fistula with fair thrill  Labs: BMET Recent Labs  Lab 03/27/23 1845 03/27/23 1945 03/28/23 0556 03/28/23 0810 03/29/23 0609  NA 133* 136 135 136 135  K 3.6 3.8 3.7 3.9 3.5  CL 99 100 102 103 98  CO2 18*  --  16* 18* 25  GLUCOSE 209* 206* 149* 115* 100*  BUN 71* 74* 79* 77* 59*  CREATININE 8.55* 9.50* 9.70* 9.50* 7.88*  CALCIUM 8.0*  --  8.2* 8.3* 8.3*  PHOS  --   --  5.5* 5.1* 5.0*   CBC Recent Labs  Lab 03/27/23 1845  03/27/23 1945 03/28/23 0556 03/28/23 0810 03/29/23 0609  WBC 14.5*  --  17.6* 15.8* 9.4  NEUTROABS  --   --   --  14.2* 5.5  HGB 8.0* 8.5* 8.0* 7.8* 7.3*  HCT 24.2* 25.0* 24.1* 23.6* 21.6*  MCV 98.4  --  96.0 96.7 95.6  PLT 222  --  223 178 168      Medications:     amLODipine  10 mg Oral Daily   Chlorhexidine Gluconate Cloth  6 each Topical Q0600   Chlorhexidine Gluconate Cloth  6 each Topical Q0600   ferrous sulfate  325 mg Oral Q breakfast   heparin  5,000 Units Subcutaneous Q8H   hydrALAZINE  100 mg Oral TID   insulin aspart  0-6 Units Subcutaneous TID WC   vitamin B-12  100 mcg Oral Daily   Zetta Bills, MD 03/29/2023, 11:20 AM

## 2023-03-29 NOTE — TOC Initial Note (Signed)
Transition of Care [facility]) - Initial/Assessment Note    Patient Details  Name: Matthew Robles MRN: [id number] Date of Birth: [birth year]  Transition of Care [facility]) CM/SW Contact:    Lawerance Sabal, RN Phone Number: 03/29/2023, 10:39 AM  Clinical Narrative:                  Sherron Monday w patient at bedside.  He states that he lives at home, is independent for ambulation, has support from his mother. He has home oxygen with adapt.  He states that he is going to check w his mom to see if they have a regulator for his transport tank at home, it may have been picked up by EMS. He states he has a few tank at home for transport and that his mom will be his ride home   May need regulator for home O2 tank for transport  Expected Discharge Plan: Home/Self Care Barriers to Discharge: Continued Medical Work up   Patient Goals and CMS Choice Patient states their goals for this hospitalization and ongoing recovery are:: to return home          Expected Discharge Plan and Services   Discharge Planning Services: CM Consult                                          Prior Living Arrangements/Services                       Activities of Daily Living   ADL Screening (condition at time of admission) Independently performs ADLs?: Yes (appropriate for developmental age) Is the patient deaf or have difficulty hearing?: No Does the patient have difficulty seeing, even when wearing glasses/contacts?: No Does the patient have difficulty concentrating, remembering, or making decisions?: No  Permission Sought/Granted                  Emotional Assessment              Admission diagnosis:  Acute respiratory failure with hypoxia (HCC) [J96.01] Acute on chronic combined systolic and diastolic CHF (congestive heart failure) (HCC) [I50.43] Patient Active Problem List   Diagnosis Date Noted   Acute on chronic combined systolic and diastolic CHF (congestive heart failure) (HCC)  03/27/2023   Elevated troponin 03/27/2023   Controlled type 2 diabetes mellitus without complication, without long-term current use of insulin (HCC) 03/27/2023   Essential hypertension 03/27/2023   Hyperglycemia 02/15/2023   Hemoptysis 09/17/2022   Volume overload 09/16/2022   Acute respiratory failure (HCC) 09/16/2022   Acute HFrEF (heart failure with reduced ejection fraction) (HCC) 06/30/2022   End-stage renal disease on hemodialysis (HCC) 06/25/2022   Acute respiratory failure with hypoxia (HCC) 06/24/2022   Anemia of chronic disease 06/24/2022   Bilateral pleural effusion 06/24/2022   Hypoalbuminemia 06/24/2022   Elevated brain natriuretic peptide (BNP) level 06/24/2022   Hypertensive emergency 02/21/2018   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 [place] [address] Phone: [phone] Fax: [phone]  [facility] [address] [place] - [address] [place] [address] [place] [address] Phone: [phone] Fax: [phone]  [facility] [address] [place] [address] Phone: [phone] Fax: [phone]     Social Determinants of Health (SDOH) Social History: SDOH Screenings   Food Insecurity: No Food Insecurity (03/28/2023)  Housing: Low Risk  (03/28/2023)  Transportation Needs: No Transportation Needs (03/28/2023)  Utilities: Not At Risk (03/28/2023)  Tobacco Use: Medium Risk (03/27/2023)  Health Literacy: Low Risk  (06/16/2021)   Received from [facility], [facility]   SDOH Interventions:     Readmission Risk  Interventions    02/16/2023   12:14 PM 09/17/2022    9:27 AM 06/29/2022   12:02 PM  Readmission Risk Prevention Plan  Transportation Screening Complete Complete Complete  HRI or Home Care Consult  Complete Complete  Social Work Consult for Recovery Care Planning/Counseling  Complete Complete  Palliative Care Screening  Not Applicable Not Applicable  Medication Review Oceanographer) Complete Complete Complete  HRI or Home Care Consult Complete    SW Recovery Care/Counseling Consult Complete    Palliative Care Screening Not Applicable    Skilled Nursing Facility Not Applicable

## 2023-03-29 NOTE — Plan of Care (Signed)
  Problem: Education: Goal: Knowledge of General Education information will improve Description: Including pain rating scale, medication(s)/side effects and non-pharmacologic comfort measures Outcome: Progressing   Problem: Health Behavior/Discharge Planning: Goal: Ability to manage health-related needs will improve Outcome: Progressing   Problem: Clinical Measurements: Goal: Ability to maintain clinical measurements within normal limits will improve Outcome: Progressing   Problem: Activity: Goal: Risk for activity intolerance will decrease Outcome: Progressing   Problem: Nutrition: Goal: Adequate nutrition will be maintained Outcome: Progressing   Problem: Coping: Goal: Level of anxiety will decrease Outcome: Progressing   Problem: Elimination: Goal: Will not experience complications related to bowel motility Outcome: Progressing Goal: Will not experience complications related to urinary retention Outcome: Progressing   Problem: Pain Management: Goal: General experience of comfort will improve Outcome: Progressing   Problem: Safety: Goal: Ability to remain free from injury will improve Outcome: Progressing   Problem: Skin Integrity: Goal: Risk for impaired skin integrity will decrease Outcome: Progressing   Problem: Fluid Volume: Goal: Ability to maintain a balanced intake and output will improve Outcome: Progressing   Problem: Metabolic: Goal: Ability to maintain appropriate glucose levels will improve Outcome: Progressing

## 2023-03-29 NOTE — Plan of Care (Signed)

## 2023-03-29 NOTE — Progress Notes (Signed)
PROGRESS NOTE    Matthew Robles  ZOX:[id number] DOB: 29-Apr-1956 DOA: 03/27/2023 PCP: [facility]  Chief Complaint  Patient presents with   Respiratory Distress    Brief Narrative:   Matthew Robles is Matthew Robles 67 y.o. male with medical history significant of hypertension, end-stage renal disease on hemodialysis (TTS), type 2 diabetes mellitus, history of stroke who presents to the emergency department from home via EMS due to respiratory distress.  Patient ran out of his Lasix within the last 2 to 3 days and started to complain of shortness of breath [place], complaining of pink frothy sputum.  EMS was activated and on arrival of EMS team, he was noted to be hypoxic and was placed on CPAP and transported to the ED for further evaluation and management.   Patient was admitted from 8/24 through 02/20/2023 due to similar presentation patient was diagnosed with acute on chronic respiratory failure with hypoxia due to pulmonary edema/fluid overload and possible community-acquired pneumonia as well as hypertensive emergency. He was treated with ceftriaxone and Zithromax and 11.5 L was removed via ultrafiltration over the course of 3 days.   Assessment & Plan:   Principal Problem:   Acute on chronic combined systolic and diastolic CHF (congestive heart failure) (HCC) Active Problems:   End-stage renal disease on hemodialysis (HCC)   Acute respiratory failure with hypoxia (HCC)   Anemia of chronic disease   Elevated brain natriuretic peptide (BNP) level   Elevated troponin   Controlled type 2 diabetes mellitus without complication, without long-term current use of insulin (HCC)   Essential hypertension  Acute Hypoxic Respiratory Failure Volume Overload  Pulmonary Edema ESRD Currently on 3-4 L (on RA at home) CXR with patchy bilateral lower lobe opacities, small R effusion S/p dialysis 11/4 Volume per renal  He takes 80 mg PO lasix  daily at home Strict I/O, daily weights  Systemic Inflammatory Response Syndrome Leukocytosis, tachycardia, tachycardia Afebrile Lower suspicion for pneumonia, suspect presentation related to volume overload PCT is elevated, but ESRD Will follow blood cultures Consider additional workup, CT scan  Hemoptysis He notes chronically coughing up blood over past month or so Of note, this was noted 08/2022 admission, he had high resolution chest CT which showed pulm edema Pulm thought it was related to hypertension and volume overload at that time Consider additional w/u if recurrent, follow outpatient   Anemia Likely due to ESRD Follow  Hypertension Amlodipine, hydralazine  T2DM SSI  AOCD Monitor    DVT prophylaxis: heparin Code Status: full Family Communication: renal Disposition:   Status is: Inpatient Remains inpatient appropriate because: continued inpatient need for hypoxia   Consultants:  renal  Procedures:  none  Antimicrobials:  Anti-infectives (From admission, onward)    None       Subjective: No commplaints  Objective: Vitals:   03/29/23 0500 03/29/23 0611 03/29/23 1438 03/29/23 2055  BP: (!) 138/56  (!) 162/87 (!) 160/81  Pulse: 92 82 88 84  Resp:    16  Temp: 98.6 F (37 C)  98.2 F (36.8 C) 98.1 F (36.7 C)  TempSrc: Oral  Oral Oral  SpO2: 100% 93% 96% 98%  Weight: 76.6 kg     Height:        Intake/Output Summary (Last 24 hours) at 03/29/2023 2055 Last data filed at 03/29/2023 0500 Gross per 24 hour  Intake --  Output 150 ml  Net -150 ml   Filed Weights   03/28/23 1111 03/28/23  1230 03/29/23 0500  Weight: 74 kg 78.3 kg 76.6 kg    Examination:  General: No acute distress. Cardiovascular: RRR Lungs: unlabored on Smiley Neurological: Alert and oriented 3. Moves all extremities 4 with equal strength. Cranial nerves II through XII grossly intact. Extremities: No clubbing or cyanosis. No edema.    Data Reviewed: I have  personally reviewed following labs and imaging studies  CBC: Recent Labs  Lab 03/27/23 1845 03/27/23 1945 03/28/23 0556 03/28/23 0810 03/29/23 0609  WBC 14.5*  --  17.6* 15.8* 9.4  NEUTROABS  --   --   --  14.2* 5.5  HGB 8.0* 8.5* 8.0* 7.8* 7.3*  HCT 24.2* 25.0* 24.1* 23.6* 21.6*  MCV 98.4  --  96.0 96.7 95.6  PLT 222  --  223 178 168    Basic Metabolic Panel: Recent Labs  Lab 03/27/23 1845 03/27/23 1945 03/28/23 0556 03/28/23 0810 03/29/23 0609  NA 133* 136 135 136 135  K 3.6 3.8 3.7 3.9 3.5  CL 99 100 102 103 98  CO2 18*  --  16* 18* 25  GLUCOSE 209* 206* 149* 115* 100*  BUN 71* 74* 79* 77* 59*  CREATININE 8.55* 9.50* 9.70* 9.50* 7.88*  CALCIUM 8.0*  --  8.2* 8.3* 8.3*  MG  --   --  QUANTITY NOT SUFFICIENT, UNABLE TO PERFORM TEST 2.4 2.2  PHOS  --   --  5.5* 5.1* 5.0*    GFR: Estimated Creatinine Clearance: 9.7 mL/min (Matthew Robles) (by C-G formula based on SCr of 7.88 mg/dL (H)).  Liver Function Tests: Recent Labs  Lab 03/27/23 1845 03/28/23 0556 03/28/23 0810 03/28/23 0900 03/29/23 0609  AST 26 25  --   --  25  ALT 25 QUANTITY NOT SUFFICIENT, UNABLE TO PERFORM TEST  --  23 19  ALKPHOS 67 66  --   --  51  BILITOT 1.7* 1.5*  --   --  2.2*  PROT 7.3 6.9  --   --  6.4*  ALBUMIN 3.5 3.2* 3.3*  --  2.9*    CBG: Recent Labs  Lab 03/28/23 1645 03/28/23 2051 03/29/23 0837 03/29/23 1208 03/29/23 1611  GLUCAP 219* 115* 84 136* 155*     Recent Results (from the past 240 hour(s))  Culture, blood (Routine X 2) w Reflex to ID Panel     Status: None (Preliminary result)   Collection Time: 03/28/23  2:01 PM   Specimen: BLOOD  Result Value Ref Range Status   Specimen Description BLOOD SITE NOT SPECIFIED  Final   Special Requests   Final    BOTTLES DRAWN AEROBIC ONLY Blood Culture results may not be optimal due to an inadequate volume of blood received in culture bottles   Culture   Final    NO GROWTH < 24 HOURS Performed at [facility], [address] [place], [place] [address]    Report Status PENDING  Incomplete  Culture, blood (Routine X 2) w Reflex to ID Panel     Status: None (Preliminary result)   Collection Time: 03/28/23  2:01 PM   Specimen: BLOOD  Result Value Ref Range Status   Specimen Description BLOOD SITE NOT SPECIFIED  Final   Special Requests   Final    BOTTLES DRAWN AEROBIC ONLY Blood Culture results may not be optimal due to an inadequate volume of blood received in culture bottles   Culture   Final    NO GROWTH < 24 HOURS Performed at [facility]  Lab, [address], [place], [place] [address]    Report Status PENDING  Incomplete         Radiology Studies: ECHOCARDIOGRAM COMPLETE  Result Date: 03/29/2023    ECHOCARDIOGRAM REPORT   Patient Name:   NAYEL PURDY Date of Exam: 03/29/2023 Medical Rec #:  [id number]       Height:       72.0 in Accession #:    [id number]      Weight:       168.8 lb Date of Birth:  1955-07-24       BSA:          1.982 m Patient Age:    67 years        BP:           138/56 mmHg Patient Gender: M               HR:           84 bpm. Exam Location:  Inpatient Procedure: 2D Echo, Color Doppler and Cardiac Doppler Indications:    CHF  History:        Patient has prior history of Echocardiogram examinations, most                 recent 06/24/2022. CHF and elevated BNP, elevated troponin, ESRD,                 and Stroke; Risk Factors:Hypertension, Diabetes and                 Dyslipidemia.  Sonographer:    Milbert Coulter Referring Phys: [id number] OLADAPO ADEFESO IMPRESSIONS  1. Left ventricular ejection fraction, by estimation, is 45 to 50%. The left ventricle has mildly decreased function. The left ventricle demonstrates global hypokinesis. There is moderate concentric left ventricular hypertrophy. Left ventricular diastolic parameters are consistent with Grade III diastolic dysfunction (restrictive). Elevated left atrial pressure. The E/e' is 27.4.  2. Right ventricular systolic function is mildly  reduced. The right ventricular size is mildly enlarged. There is severely elevated pulmonary artery systolic pressure. The estimated right ventricular systolic pressure is 61.0 mmHg.  3. Left atrial size was severely dilated.  4. Right atrial size was severely dilated.  5. The mitral valve is grossly normal. Mild mitral valve regurgitation. No evidence of mitral stenosis.  6. The aortic valve is tricuspid. There is mild calcification of the aortic valve. There is mild thickening of the aortic valve. Aortic valve regurgitation is trivial. Aortic valve sclerosis is present, with no evidence of aortic valve stenosis.  7. The inferior vena cava is dilated in size with <50% respiratory variability, suggesting right atrial pressure of 15 mmHg. Comparison(s): Changes from prior study are noted. LVEF unchanged. PA pressures severely elevated. FINDINGS  Left Ventricle: Left ventricular ejection fraction, by estimation, is 45 to 50%. The left ventricle has mildly decreased function. The left ventricle demonstrates global hypokinesis. The left ventricular internal cavity size was normal in size. There is  moderate concentric left ventricular hypertrophy. Left ventricular diastolic parameters are consistent with Grade III diastolic dysfunction (restrictive). Elevated left atrial pressure. The E/e' is 27.4. Right Ventricle: The right ventricular size is mildly enlarged. No increase in right ventricular wall thickness. Right ventricular systolic function is mildly reduced. There is severely elevated pulmonary artery systolic pressure. The tricuspid regurgitant velocity is 3.39 m/s, and with an assumed right atrial pressure of 15 mmHg, the estimated right ventricular systolic pressure is 61.0 mmHg. Left Atrium: Left atrial size  was severely dilated. Right Atrium: Right atrial size was severely dilated. Pericardium: Trivial pericardial effusion is present. Mitral Valve: The mitral valve is grossly normal. Mild mitral valve  regurgitation. No evidence of mitral valve stenosis. Tricuspid Valve: The tricuspid valve is grossly normal. Tricuspid valve regurgitation is mild . No evidence of tricuspid stenosis. Aortic Valve: The aortic valve is tricuspid. There is mild calcification of the aortic valve. There is mild thickening of the aortic valve. Aortic valve regurgitation is trivial. Aortic valve sclerosis is present, with no evidence of aortic valve stenosis. Aortic valve mean gradient measures 7.0 mmHg. Aortic valve peak gradient measures 12.7 mmHg. Aortic valve area, by VTI measures 2.44 cm. Pulmonic Valve: The pulmonic valve was grossly normal. Pulmonic valve regurgitation is not visualized. No evidence of pulmonic stenosis. Aorta: The aortic root and ascending aorta are structurally normal, with no evidence of dilitation. Venous: The inferior vena cava is dilated in size with less than 50% respiratory variability, suggesting right atrial pressure of 15 mmHg. IAS/Shunts: The atrial septum is grossly normal.  LEFT VENTRICLE PLAX 2D LVIDd:         4.90 cm   Diastology LVIDs:         3.40 cm   LV e' medial:    5.44 cm/s LV PW:         1.50 cm   LV E/e' medial:  27.4 LV IVS:        1.40 cm   LV e' lateral:   10.90 cm/s LVOT diam:     2.20 cm   LV E/e' lateral: 13.7 LV SV:         86 LV SV Index:   44 LVOT Area:     3.80 cm  RIGHT VENTRICLE RV Basal diam:  4.30 cm RV Mid diam:    2.50 cm RV S prime:     15.30 cm/s TAPSE (M-mode): 1.7 cm LEFT ATRIUM              Index        RIGHT ATRIUM           Index LA diam:        5.30 cm  2.67 cm/m   RA Area:     28.10 cm LA Vol (A2C):   137.0 ml 69.11 ml/m  RA Volume:   104.00 ml 52.46 ml/m LA Vol (A4C):   98.7 ml  49.79 ml/m LA Biplane Vol: 121.0 ml 61.04 ml/m  AORTIC VALVE AV Area (Vmax):    2.43 cm AV Area (Vmean):   2.38 cm AV Area (VTI):     2.44 cm AV Vmax:           178.00 cm/s AV Vmean:          121.000 cm/s AV VTI:            0.354 m AV Peak Grad:      12.7 mmHg AV Mean Grad:       7.0 mmHg LVOT Vmax:         114.00 cm/s LVOT Vmean:        75.700 cm/s LVOT VTI:          0.227 m LVOT/AV VTI ratio: 0.64  AORTA Ao Root diam: 3.50 cm Ao Asc diam:  3.50 cm MITRAL VALVE                TRICUSPID VALVE MV Area (PHT): 4.86 cm     TR Peak grad:   46.0  mmHg MV Decel Time: 156 msec     TR Vmax:        339.00 cm/s MV E velocity: 149.00 cm/s MV Billey Wojciak velocity: 71.90 cm/s   SHUNTS MV E/Aiyah Scarpelli ratio:  2.07         Systemic VTI:  0.23 m                             Systemic Diam: 2.20 cm Lennie Odor MD Electronically signed by Lennie Odor MD Signature Date/Time: 03/29/2023/8:33:30 AM    Final         Scheduled Meds:  amLODipine  10 mg Oral Daily   Chlorhexidine Gluconate Cloth  6 each Topical Q0600   Chlorhexidine Gluconate Cloth  6 each Topical Q0600   ferrous sulfate  325 mg Oral Q breakfast   heparin  5,000 Units Subcutaneous Q8H   hydrALAZINE  100 mg Oral TID   insulin aspart  0-6 Units Subcutaneous TID WC   vitamin B-12  100 mcg Oral Daily   Continuous Infusions:   LOS: 2 days    Time spent: over 30 min    Lacretia Nicks, MD [facility]   To contact the attending provider between 7A-7P or the covering provider during after hours 7P-7A, please log into the web site [email or web address] and access using universal [place] password for that web site. If you do not have the password, please call the hospital operator.  03/29/2023, 8:55 PM

## 2023-03-30 DIAGNOSIS — I5043 Acute on chronic combined systolic (congestive) and diastolic (congestive) heart failure: Secondary | ICD-10-CM | POA: Diagnosis not present

## 2023-03-30 DIAGNOSIS — N186 End stage renal disease: Secondary | ICD-10-CM

## 2023-03-30 DIAGNOSIS — E119 Type 2 diabetes mellitus without complications: Secondary | ICD-10-CM | POA: Diagnosis not present

## 2023-03-30 DIAGNOSIS — Z992 Dependence on renal dialysis: Secondary | ICD-10-CM

## 2023-03-30 DIAGNOSIS — J9601 Acute respiratory failure with hypoxia: Secondary | ICD-10-CM | POA: Diagnosis not present

## 2023-03-30 LAB — RENAL FUNCTION PANEL
Albumin: 2.7 g/dL — ABNORMAL LOW (ref 3.5–5.0)
Anion gap: 16 — ABNORMAL HIGH (ref 5–15)
BUN: 73 mg/dL — ABNORMAL HIGH (ref 8–23)
CO2: 24 mmol/L (ref 22–32)
Calcium: 8.2 mg/dL — ABNORMAL LOW (ref 8.9–10.3)
Chloride: 96 mmol/L — ABNORMAL LOW (ref 98–111)
Creatinine, Ser: 9.35 mg/dL — ABNORMAL HIGH (ref 0.61–1.24)
GFR, Estimated: 6 mL/min — ABNORMAL LOW (ref >60)
Glucose, Bld: 157 mg/dL — ABNORMAL HIGH (ref 70–99)
Phosphorus: 5 mg/dL — ABNORMAL HIGH (ref 2.5–4.6)
Potassium: 3.5 mmol/L (ref 3.5–5.1)
Sodium: 136 mmol/L (ref 135–145)

## 2023-03-30 LAB — GLUCOSE, CAPILLARY
Glucose-Capillary: 96 mg/dL (ref 70–99)
Glucose-Capillary: 83 mg/dL (ref 70–99)
Glucose-Capillary: 182 mg/dL — ABNORMAL HIGH (ref 70–99)
Glucose-Capillary: 86 mg/dL (ref 70–99)

## 2023-03-30 LAB — CBC
HCT: 20.5 % — ABNORMAL LOW (ref 39.0–52.0)
Hemoglobin: 6.8 g/dL — CL (ref 13.0–17.0)
MCH: 31.5 pg (ref 26.0–34.0)
MCHC: 33.2 g/dL (ref 30.0–36.0)
MCV: 94.9 fL (ref 80.0–100.0)
Platelets: 181 10*3/uL (ref 150–400)
RBC: 2.16 MIL/uL — ABNORMAL LOW (ref 4.22–5.81)
RDW: 14.5 % (ref 11.5–15.5)
WBC: 10.9 10*3/uL — ABNORMAL HIGH (ref 4.0–10.5)
nRBC: 0 % (ref 0.0–0.2)

## 2023-03-30 LAB — HEMOGLOBIN AND HEMATOCRIT, BLOOD
HCT: 27.7 % — ABNORMAL LOW (ref 39.0–52.0)
Hemoglobin: 9.4 g/dL — ABNORMAL LOW (ref 13.0–17.0)

## 2023-03-30 LAB — PREPARE RBC (CROSSMATCH)

## 2023-03-30 MED ORDER — DARBEPOETIN ALFA 100 MCG/0.5ML IJ SOSY
100.0000 ug | PREFILLED_SYRINGE | INTRAMUSCULAR | Status: DC
Start: 2023-03-30 — End: 2023-04-01
  Administered 2023-03-30: 100 ug via SUBCUTANEOUS
  Filled 2023-03-30: qty 0.5

## 2023-03-30 MED ORDER — SODIUM CHLORIDE 0.9% IV SOLUTION: Freq: Once | INTRAVENOUS | Status: AC

## 2023-03-30 NOTE — Care Management Important Message (Signed)
Important Message  Patient Details  Name: Matthew Robles MRN: [id number] Date of Birth: 1955-11-04   Important Message Given:  Yes - Medicare IM     Dorena Bodo 03/30/2023, 3:39 PM

## 2023-03-30 NOTE — Procedures (Signed)
Patient seen on Hemodialysis. BP (!) 170/88   Pulse 80   Temp 98.8 F (37.1 C) (Oral)   Resp 15   Ht 6' (1.829 m)   Wt 77.5 kg Comment: standing  SpO2 94%   BMI 23.17 kg/m   QB 400, UF goal 3L Tolerating treatment without complaints at this time.   Zetta Bills MD [facility]. Office # [phone] Pager # [phone] 10:01 AM

## 2023-03-30 NOTE — Progress Notes (Signed)
Received patient in bed to unit.  Alert and oriented.  Informed consent signed and in chart.   TX duration:3 hours and 15 minutes  Patient tolerated well.  Transported back to the room  Alert, without acute distress.  Hand-off given to patient's nurse.   Access used: Left Upper Arm Fistula Access issues: none   Total UF removed: 3L Medication(s) given: none   03/30/23 1300  Vitals  Temp 98.8 F (37.1 C)  BP (!) 165/91  MAP (mmHg) 113  BP Location Right Arm  BP Method Automatic  Patient Position (if appropriate) Lying  Pulse Rate 86  Pulse Rate Source Monitor  ECG Heart Rate 86  Resp 15  Oxygen Therapy  SpO2 100 %  O2 Device Nasal Cannula  O2 Flow Rate (L/min) 3 L/min  During Treatment Monitoring  Duration of HD Treatment -hour(s) 3.25 hour(s)  HD Safety Checks Performed Yes  Intra-Hemodialysis Comments Tx completed;Tolerated well  Dialysis Fluid Bolus Normal Saline  Bolus Amount (mL) 300 mL  Fistula / Graft Left Forearm Arteriovenous fistula  Placement Date/Time: 07/13/22 1045   Orientation: Left  Access Location: Forearm  Access Type: (c) Arteriovenous fistula  Status Deaccessed     Stacie Glaze LPN [facility]

## 2023-03-30 NOTE — Progress Notes (Signed)
[facility] PROGRESS NOTE    Progress Note  Matthew Robles  XNA:[id number] DOB: 09/13/1955 DOA: 03/27/2023 PCP: Elmer Picker, [facility]     Brief Narrative:   Matthew Robles is an 67 y.o. male past medical history significant for essential hypertension and end-stage renal disease on hemodialysis Monday Wednesday Friday, diabetes mellitus type 2, recently discharged from the hospital 02/20/2023 for pulmonary edema with 12 L removed via ultrafiltration over the course of 3 days, history of strokes comes into the hospital via EMS for respiratory distress, he relates he ran out of his Lasix about a week prior to admission.  In the ED was placed on CPAP    Assessment/Plan:   Acute respiratory failure with hypoxia likely due to pulmonary edema: Likely due to noncompliance with his medication Chest x-ray showed bilateral infiltrates Currently getting dialysis on 03/30/2023. Resume his oral Lasix. Further diuresis per nephrology. At home he is on room air, here he still requiring 2 to 3 L of oxygen.  SIRS: Likely due to volume overload. Has remained afebrile leukocytosis resolved blood cultures have been negative till date  Hemoptysis: He relates he has chronic, this year high-resolution CT showed pulmonary edema pulmonary was consulted related he was likely due to hypertension and volume overload.  Anemia of chronic renal disease: Continue management per renal.  Essential hypertension: Continue amlodipine and hydralazine.  Diabetes mellitus type 2: Continue sliding scale insulin  DVT prophylaxis: lovenox Family Communication:none Status is: Inpatient Remains inpatient appropriate because: Acute pulmonary edema    Code Status:     Code Status Orders  (From admission, onward)           Start     Ordered   03/27/23 2333  Full code  Continuous       Question:  By:  Answer:  Consent: discussion documented in EHR   03/27/23 2336            Code Status History     Date Active Date Inactive Code Status Order ID Comments User Context   02/15/2023 2139 02/20/2023 1624 Full Code [id number]  Nolberto Hanlon, MD ED   09/16/2022 1214 09/18/2022 2114 Full Code [id number]  Kendell Bane, MD ED   06/24/2022 1112 07/06/2022 0036 Full Code [id number]  Cleora Fleet, MD ED   02/21/2018 1902 02/25/2018 1658 Full Code [id number]  Erick Blinks, MD Inpatient   01/15/2016 0500 01/17/2016 1431 Full Code [id number]  Meredeth Ide, MD Inpatient         IV Access:   Peripheral IV   Procedures and diagnostic studies:   ECHOCARDIOGRAM COMPLETE  Result Date: 03/29/2023    ECHOCARDIOGRAM REPORT   Patient Name:   Matthew Robles Date of Exam: 03/29/2023 Medical Rec #:  [id number]       Height:       72.0 in Accession #:    [id number]      Weight:       168.8 lb Date of Birth:  [birth year]       BSA:          1.982 m Patient Age:    67 years        BP:           138/56 mmHg Patient Gender: M               HR:           84 bpm. Exam Location:  Inpatient Procedure: 2D Echo, Color Doppler and Cardiac Doppler  Indications:    CHF  History:        Patient has prior history of Echocardiogram examinations, most                 recent 06/24/2022. CHF and elevated BNP, elevated troponin, ESRD,                 and Stroke; Risk Factors:Hypertension, Diabetes and                 Dyslipidemia.  Sonographer:    Milbert Coulter Referring Phys: [id number] OLADAPO ADEFESO IMPRESSIONS  1. Left ventricular ejection fraction, by estimation, is 45 to 50%. The left ventricle has mildly decreased function. The left ventricle demonstrates global hypokinesis. There is moderate concentric left ventricular hypertrophy. Left ventricular diastolic parameters are consistent with Grade III diastolic dysfunction (restrictive). Elevated left atrial pressure. The E/e' is 27.4.  2. Right ventricular systolic function is mildly reduced. The right ventricular size is mildly enlarged. There is severely  elevated pulmonary artery systolic pressure. The estimated right ventricular systolic pressure is 61.0 mmHg.  3. Left atrial size was severely dilated.  4. Right atrial size was severely dilated.  5. The mitral valve is grossly normal. Mild mitral valve regurgitation. No evidence of mitral stenosis.  6. The aortic valve is tricuspid. There is mild calcification of the aortic valve. There is mild thickening of the aortic valve. Aortic valve regurgitation is trivial. Aortic valve sclerosis is present, with no evidence of aortic valve stenosis.  7. The inferior vena cava is dilated in size with <50% respiratory variability, suggesting right atrial pressure of 15 mmHg. Comparison(s): Changes from prior study are noted. LVEF unchanged. PA pressures severely elevated. FINDINGS  Left Ventricle: Left ventricular ejection fraction, by estimation, is 45 to 50%. The left ventricle has mildly decreased function. The left ventricle demonstrates global hypokinesis. The left ventricular internal cavity size was normal in size. There is  moderate concentric left ventricular hypertrophy. Left ventricular diastolic parameters are consistent with Grade III diastolic dysfunction (restrictive). Elevated left atrial pressure. The E/e' is 27.4. Right Ventricle: The right ventricular size is mildly enlarged. No increase in right ventricular wall thickness. Right ventricular systolic function is mildly reduced. There is severely elevated pulmonary artery systolic pressure. The tricuspid regurgitant velocity is 3.39 m/s, and with an assumed right atrial pressure of 15 mmHg, the estimated right ventricular systolic pressure is 61.0 mmHg. Left Atrium: Left atrial size was severely dilated. Right Atrium: Right atrial size was severely dilated. Pericardium: Trivial pericardial effusion is present. Mitral Valve: The mitral valve is grossly normal. Mild mitral valve regurgitation. No evidence of mitral valve stenosis. Tricuspid Valve: The  tricuspid valve is grossly normal. Tricuspid valve regurgitation is mild . No evidence of tricuspid stenosis. Aortic Valve: The aortic valve is tricuspid. There is mild calcification of the aortic valve. There is mild thickening of the aortic valve. Aortic valve regurgitation is trivial. Aortic valve sclerosis is present, with no evidence of aortic valve stenosis. Aortic valve mean gradient measures 7.0 mmHg. Aortic valve peak gradient measures 12.7 mmHg. Aortic valve area, by VTI measures 2.44 cm. Pulmonic Valve: The pulmonic valve was grossly normal. Pulmonic valve regurgitation is not visualized. No evidence of pulmonic stenosis. Aorta: The aortic root and ascending aorta are structurally normal, with no evidence of dilitation. Venous: The inferior vena cava is dilated in size with less than 50% respiratory variability, suggesting right atrial pressure of 15 mmHg. IAS/Shunts: The atrial septum is grossly normal.  LEFT VENTRICLE PLAX 2D LVIDd:         4.90 cm   Diastology LVIDs:         3.40 cm   LV e' medial:    5.44 cm/s LV PW:         1.50 cm   LV E/e' medial:  27.4 LV IVS:        1.40 cm   LV e' lateral:   10.90 cm/s LVOT diam:     2.20 cm   LV E/e' lateral: 13.7 LV SV:         86 LV SV Index:   44 LVOT Area:     3.80 cm  RIGHT VENTRICLE RV Basal diam:  4.30 cm RV Mid diam:    2.50 cm RV S prime:     15.30 cm/s TAPSE (M-mode): 1.7 cm LEFT ATRIUM              Index        RIGHT ATRIUM           Index LA diam:        5.30 cm  2.67 cm/m   RA Area:     28.10 cm LA Vol (A2C):   137.0 ml 69.11 ml/m  RA Volume:   104.00 ml 52.46 ml/m LA Vol (A4C):   98.7 ml  49.79 ml/m LA Biplane Vol: 121.0 ml 61.04 ml/m  AORTIC VALVE AV Area (Vmax):    2.43 cm AV Area (Vmean):   2.38 cm AV Area (VTI):     2.44 cm AV Vmax:           178.00 cm/s AV Vmean:          121.000 cm/s AV VTI:            0.354 m AV Peak Grad:      12.7 mmHg AV Mean Grad:      7.0 mmHg LVOT Vmax:         114.00 cm/s LVOT Vmean:        75.700 cm/s  LVOT VTI:          0.227 m LVOT/AV VTI ratio: 0.64  AORTA Ao Root diam: 3.50 cm Ao Asc diam:  3.50 cm MITRAL VALVE                TRICUSPID VALVE MV Area (PHT): 4.86 cm     TR Peak grad:   46.0 mmHg MV Decel Time: 156 msec     TR Vmax:        339.00 cm/s MV E velocity: 149.00 cm/s MV A velocity: 71.90 cm/s   SHUNTS MV E/A ratio:  2.07         Systemic VTI:  0.23 m                             Systemic Diam: 2.20 cm Lennie Odor MD Electronically signed by Lennie Odor MD Signature Date/Time: 03/29/2023/8:33:30 AM    Final      Medical Consultants:   None.   Subjective:    Matthew Robles relates his breathing is about the same  Objective:    Vitals:   03/30/23 0903 03/30/23 0912 03/30/23 0927 03/30/23 1000  BP:  (!) 163/85 (!) 170/88 (!) 174/88  Pulse:  82 80 79  Resp:  10 15 (!) 21  Temp:  98.8 F (37.1 C)    TempSrc:  Oral    SpO2:  96% 94% 96%  Weight: 77.5 kg     Height:       SpO2: 96 % O2 Flow Rate (L/min): 3 L/min FiO2 (%): 60 %   Intake/Output Summary (Last 24 hours) at 03/30/2023 1029 Last data filed at 03/30/2023 0755 Gross per 24 hour  Intake 240 ml  Output 200 ml  Net 40 ml   Filed Weights   03/29/23 0500 03/30/23 0431 03/30/23 0903  Weight: 76.6 kg 77.1 kg 77.5 kg    Exam: General exam: In no acute distress. Respiratory system: Good air movement and clear to auscultation. Cardiovascular system: S1 & S2 heard, RRR.  Positive JVD Gastrointestinal system: Abdomen is nondistended, soft and nontender.  Extremities: No pedal edema. Skin: No rashes, lesions or ulcers Psychiatry: Judgement and insight appear normal. Mood & affect appropriate.    Data Reviewed:    Labs: Basic Metabolic Panel: Recent Labs  Lab 03/27/23 1845 03/27/23 1945 03/28/23 0556 03/28/23 0810 03/29/23 0609  NA 133* 136 135 136 135  K 3.6 3.8 3.7 3.9 3.5  CL 99 100 102 103 98  CO2 18*  --  16* 18* 25  GLUCOSE 209* 206* 149* 115* 100*  BUN 71* 74* 79* 77* 59*  CREATININE  8.55* 9.50* 9.70* 9.50* 7.88*  CALCIUM 8.0*  --  8.2* 8.3* 8.3*  MG  --   --  QUANTITY NOT SUFFICIENT, UNABLE TO PERFORM TEST 2.4 2.2  PHOS  --   --  5.5* 5.1* 5.0*   GFR Estimated Creatinine Clearance: 9.8 mL/min (A) (by C-G formula based on SCr of 7.88 mg/dL (H)). Liver Function Tests: Recent Labs  Lab 03/27/23 1845 03/28/23 0556 03/28/23 0810 03/28/23 0900 03/29/23 0609  AST 26 25  --   --  25  ALT 25 QUANTITY NOT SUFFICIENT, UNABLE TO PERFORM TEST  --  23 19  ALKPHOS 67 66  --   --  51  BILITOT 1.7* 1.5*  --   --  2.2*  PROT 7.3 6.9  --   --  6.4*  ALBUMIN 3.5 3.2* 3.3*  --  2.9*   No results for input(s): "LIPASE", "AMYLASE" in the last 168 hours. No results for input(s): "AMMONIA" in the last 168 hours. Coagulation profile No results for input(s): "INR", "PROTIME" in the last 168 hours. COVID-19 Labs  No results for input(s): "DDIMER", "FERRITIN", "LDH", "CRP" in the last 72 hours.  Lab Results  Component Value Date   SARSCOV2NAA NEGATIVE 02/15/2023   SARSCOV2NAA NEGATIVE 06/24/2022    CBC: Recent Labs  Lab 03/27/23 1845 03/27/23 1945 03/28/23 0556 03/28/23 0810 03/29/23 0609  WBC 14.5*  --  17.6* 15.8* 9.4  NEUTROABS  --   --   --  14.2* 5.5  HGB 8.0* 8.5* 8.0* 7.8* 7.3*  HCT 24.2* 25.0* 24.1* 23.6* 21.6*  MCV 98.4  --  96.0 96.7 95.6  PLT 222  --  223 178 168   Cardiac Enzymes: No results for input(s): "CKTOTAL", "CKMB", "CKMBINDEX", "TROPONINI" in the last 168 hours. BNP (last 3 results) No results for input(s): "PROBNP" in the last 8760 hours. CBG: Recent Labs  Lab 03/29/23 0837 03/29/23 1208 03/29/23 1611 03/29/23 2149 03/30/23 0747  GLUCAP 84 136* 155* 101* 96   D-Dimer: No results for input(s): "DDIMER" in the last 72 hours. Hgb A1c: No results for input(s): "HGBA1C" in the last 72 hours. Lipid Profile: No results for input(s): "CHOL", "HDL", "LDLCALC", "TRIG", "CHOLHDL", "LDLDIRECT" in the last 72 hours. Thyroid function  studies:  No results for input(s): "TSH", "T4TOTAL", "T3FREE", "THYROIDAB" in the last 72 hours.  Invalid input(s): "FREET3" Anemia work up: No results for input(s): "VITAMINB12", "FOLATE", "FERRITIN", "TIBC", "IRON", "RETICCTPCT" in the last 72 hours. Sepsis Labs: Recent Labs  Lab 03/27/23 1845 03/28/23 0556 03/28/23 0810 03/28/23 0832 03/29/23 0609  PROCALCITON  --   --   --  6.48  --   WBC 14.5* 17.6* 15.8*  --  9.4   Microbiology Recent Results (from the past 240 hour(s))  Culture, blood (Routine X 2) w Reflex to ID Panel     Status: None (Preliminary result)   Collection Time: 03/28/23  2:01 PM   Specimen: BLOOD  Result Value Ref Range Status   Specimen Description BLOOD SITE NOT SPECIFIED  Final   Special Requests   Final    BOTTLES DRAWN AEROBIC ONLY Blood Culture results may not be optimal due to an inadequate volume of blood received in culture bottles   Culture   Final    NO GROWTH 2 DAYS Performed at [facility], [address], [place], [place] [address]    Report Status PENDING  Incomplete  Culture, blood (Routine X 2) w Reflex to ID Panel     Status: None (Preliminary result)   Collection Time: 03/28/23  2:01 PM   Specimen: BLOOD  Result Value Ref Range Status   Specimen Description BLOOD SITE NOT SPECIFIED  Final   Special Requests   Final    BOTTLES DRAWN AEROBIC ONLY Blood Culture results may not be optimal due to an inadequate volume of blood received in culture bottles   Culture   Final    NO GROWTH 2 DAYS Performed at [facility], [address] [place], [place] [address]    Report Status PENDING  Incomplete     Medications:    amLODipine  10 mg Oral Daily   Chlorhexidine Gluconate Cloth  6 each Topical Q0600   Chlorhexidine Gluconate Cloth  6 each Topical Q0600   darbepoetin (ARANESP) injection - NON-DIALYSIS  100 mcg Subcutaneous Q Wed-1800   ferrous sulfate  325 mg Oral Q breakfast   heparin  5,000 Units Subcutaneous Q8H    hydrALAZINE  100 mg Oral TID   insulin aspart  0-6 Units Subcutaneous TID WC   vitamin B-12  100 mcg Oral Daily   Continuous Infusions:    LOS: 3 days   Marinda Elk  [facility]  03/30/2023, 10:29 AM

## 2023-03-30 NOTE — Plan of Care (Signed)
  Problem: Clinical Measurements: Goal: Respiratory complications will improve Outcome: Progressing   Problem: Pain Management: Goal: General experience of comfort will improve Outcome: Progressing   Problem: Safety: Goal: Ability to remain free from injury will improve Outcome: Progressing   Problem: Skin Integrity: Goal: Risk for impaired skin integrity will decrease Outcome: Progressing

## 2023-03-30 NOTE — Progress Notes (Signed)
Date and time results received: 03/30/23 @1105AM    Test: Hemoglobin Critical Value: 6.8  Name of Provider Notified: Dr. David Stall

## 2023-03-31 DIAGNOSIS — I5043 Acute on chronic combined systolic (congestive) and diastolic (congestive) heart failure: Secondary | ICD-10-CM | POA: Diagnosis not present

## 2023-03-31 DIAGNOSIS — J9601 Acute respiratory failure with hypoxia: Secondary | ICD-10-CM | POA: Diagnosis not present

## 2023-03-31 DIAGNOSIS — N186 End stage renal disease: Secondary | ICD-10-CM | POA: Diagnosis not present

## 2023-03-31 DIAGNOSIS — E119 Type 2 diabetes mellitus without complications: Secondary | ICD-10-CM | POA: Diagnosis not present

## 2023-03-31 DIAGNOSIS — J810 Acute pulmonary edema: Secondary | ICD-10-CM

## 2023-03-31 LAB — BPAM RBC
Blood Product Expiration Date: 202411272359
Blood Product Expiration Date: 202411272359
ISSUE DATE / TIME: 202411061457
ISSUE DATE / TIME: 202411061802
Unit Type and Rh: 5100
Unit Type and Rh: 5100

## 2023-03-31 LAB — TYPE AND SCREEN
ABO/RH(D): O POS
Antibody Screen: NEGATIVE
Unit division: 0
Unit division: 0

## 2023-03-31 LAB — GLUCOSE, CAPILLARY
Glucose-Capillary: 101 mg/dL — ABNORMAL HIGH (ref 70–99)
Glucose-Capillary: 96 mg/dL (ref 70–99)
Glucose-Capillary: 164 mg/dL — ABNORMAL HIGH (ref 70–99)
Glucose-Capillary: 122 mg/dL — ABNORMAL HIGH (ref 70–99)

## 2023-03-31 MED ORDER — FUROSEMIDE 40 MG PO TABS
80.0000 mg | ORAL_TABLET | Freq: Every day | ORAL | Status: DC
  Administered 2023-03-31 – 2023-04-01 (×2): 80 mg via ORAL
  Filled 2023-03-31 (×2): qty 2

## 2023-03-31 MED ORDER — LABETALOL HCL 200 MG PO TABS
400.0000 mg | ORAL_TABLET | Freq: Two times a day (BID) | ORAL | Status: DC
  Administered 2023-03-31 – 2023-04-01 (×4): 400 mg via ORAL
  Filled 2023-03-31 (×4): qty 2

## 2023-03-31 MED ORDER — ISOSORBIDE MONONITRATE ER 30 MG PO TB24: 15.0000 mg | ORAL_TABLET | Freq: Every day | ORAL | Status: DC

## 2023-03-31 NOTE — Plan of Care (Signed)
  Problem: Clinical Measurements: Goal: Respiratory complications will improve Outcome: Progressing   Problem: Activity: Goal: Risk for activity intolerance will decrease Outcome: Progressing   Problem: Pain Management: Goal: General experience of comfort will improve Outcome: Progressing   Problem: Safety: Goal: Ability to remain free from injury will improve Outcome: Progressing   Problem: Skin Integrity: Goal: Risk for impaired skin integrity will decrease Outcome: Progressing

## 2023-03-31 NOTE — TOC Transition Note (Signed)
Transition of Care [facility]) - CM/SW Discharge Note   Patient Details  Name: Cage Gupton MRN: [id number] Date of Birth: 07-Apr-1956  [facility] CM/SW Contact:  Lockie Pares, RN Phone Number: 03/31/2023, 11:28 AM   Clinical Narrative:    Patient is using oxygen PRN, as he does at home. NO furthere needs identified.    Final next level of care: Home/Self Care Barriers to Discharge: No Barriers Identified   Patient Goals and CMS Choice      Discharge Placement    Home                     Discharge Plan and Services Additional resources added to the After Visit Summary for     Discharge Planning Services: CM Consult                                 Social Determinants of Health (SDOH) Interventions SDOH Screenings   Food Insecurity: No Food Insecurity (03/28/2023)  Housing: Low Risk  (03/28/2023)  Transportation Needs: No Transportation Needs (03/28/2023)  Utilities: Not At Risk (03/28/2023)  Tobacco Use: Medium Risk (03/27/2023)  Health Literacy: Low Risk  (06/16/2021)   Received from [facility]     Readmission Risk Interventions    02/16/2023   12:14 PM 09/17/2022    9:27 AM 06/29/2022   12:02 PM  Readmission Risk Prevention Plan  Transportation Screening Complete Complete Complete  HRI or Home Care Consult  Complete Complete  Social Work Consult for Recovery Care Planning/Counseling  Complete Complete  Palliative Care Screening  Not Applicable Not Applicable  Medication Review Oceanographer) Complete Complete Complete  HRI or Home Care Consult Complete    SW Recovery Care/Counseling Consult Complete    Palliative Care Screening Not Applicable    Skilled Nursing Facility Not Applicable

## 2023-03-31 NOTE — Progress Notes (Signed)
SATURATION QUALIFICATIONS: (This note is used to comply with regulatory documentation for home oxygen)  Patient Saturations on Room Air at Rest = 98%  Patient Saturations on Room Air while Ambulating = 90%  Please briefly explain why patient needs home oxygen: Pt does not need continuous supplemental oxygen during ADLs or mobility.

## 2023-03-31 NOTE — Progress Notes (Signed)
[facility] PROGRESS NOTE    Progress Note  Matthew Robles  YQM:[id number] DOB: May 08, 1956 DOA: 03/27/2023 PCP: Elmer Picker, [facility]     Brief Narrative:   Matthew Robles is an 67 y.o. male past medical history significant for essential hypertension and end-stage renal disease on hemodialysis Monday, Wednesday, Friday, diabetes mellitus type 2, recently discharged from the hospital 02/20/2023 for pulmonary edema with 12 L removed via ultrafiltration over the course of 3 days, history of strokes comes into the hospital via EMS for respiratory distress, he relates he ran out of his Lasix about a week prior to admission.  In the ED was placed on CPAP  Assessment/Plan:   Acute respiratory failure with hypoxia likely due to pulmonary edema: Likely due to noncompliance with his medication Started on  dialysis negative about 6 L. Further diuresis per nephrology. Now on 1 L of oxygen, wean to room air, out of bed to chair. PT OT consult  SIRS: Likely due to volume overload. Has remained afebrile leukocytosis resolved blood cultures have been negative till date.  End-stage renal disease: On hemodialysis Tuesday Thursdays and Saturdays. Nephrology has been consulted dialyzed on 03/30/2023. Resume his home dose of Lasix.  Hemoptysis: He relates he has chronic, this year high-resolution CT showed pulmonary edema pulmonary was consulted related he was likely due to hypertension and volume overload.  Anemia of chronic renal disease: Continue management per renal.  Essential hypertension: Blood pressure is elevated, continue amlodipine and hydralazine. Resume home dose of labetalol.  Diabetes mellitus type 2: Continue sliding scale insulin  DVT prophylaxis: lovenox Family Communication:none Status is: Inpatient Remains inpatient appropriate because: Acute pulmonary edema    Code Status:     Code Status Orders  (From admission, onward)            Start     Ordered   03/27/23 2333  Full code  Continuous       Question:  By:  Answer:  Consent: discussion documented in EHR   03/27/23 2336           Code Status History     Date Active Date Inactive Code Status Order ID Comments User Context   02/15/2023 2139 02/20/2023 1624 Full Code [id number]  Nolberto Hanlon, MD ED   09/16/2022 1214 09/18/2022 2114 Full Code [id number]  Kendell Bane, MD ED   06/24/2022 1112 07/06/2022 0036 Full Code [id number]  Cleora Fleet, MD ED   02/21/2018 1902 02/25/2018 1658 Full Code [id number]  Erick Blinks, MD Inpatient   01/15/2016 0500 01/17/2016 1431 Full Code [id number]  Meredeth Ide, MD Inpatient         IV Access:   Peripheral IV   Procedures and diagnostic studies:   No results found.   Medical Consultants:   None.   Subjective:    Matthew Robles relates his reading is improved.  Objective:    Vitals:   03/30/23 2144 03/30/23 2146 03/31/23 0344 03/31/23 0756  BP: (!) 169/86 (!) 162/92 (!) 146/70 (!) 156/96  Pulse:  89 83 81  Resp:  19 15 15   Temp:  98.6 F (37 C) 98.2 F (36.8 C) 98.9 F (37.2 C)  TempSrc:  Oral Oral Oral  SpO2:  99% 99% 98%  Weight:   74.9 kg   Height:       SpO2: 98 % O2 Flow Rate (L/min): 1 L/min FiO2 (%): 60 %   Intake/Output Summary (Last 24 hours) at 03/31/2023 1020 Last data filed at  03/31/2023 0756 Gross per 24 hour  Intake 1129 ml  Output 3800 ml  Net -2671 ml   Filed Weights   03/30/23 0903 03/30/23 1305 03/31/23 0344  Weight: 77.5 kg 74.5 kg 74.9 kg    Exam: General exam: In no acute distress. Respiratory system: Good air movement and clear to auscultation. Cardiovascular system: S1 & S2 heard, RRR.  Negative JVD Gastrointestinal system: Abdomen is nondistended, soft and nontender.  Extremities: No pedal edema. Skin: No rashes, lesions or ulcers Psychiatry: Judgement and insight appear normal. Mood & affect appropriate.  Data Reviewed:    Labs: Basic Metabolic  Panel: Recent Labs  Lab 03/27/23 1845 03/27/23 1945 03/28/23 0556 03/28/23 0810 03/29/23 0609 03/30/23 0908  NA 133* 136 135 136 135 136  K 3.6 3.8 3.7 3.9 3.5 3.5  CL 99 100 102 103 98 96*  CO2 18*  --  16* 18* 25 24  GLUCOSE 209* 206* 149* 115* 100* 157*  BUN 71* 74* 79* 77* 59* 73*  CREATININE 8.55* 9.50* 9.70* 9.50* 7.88* 9.35*  CALCIUM 8.0*  --  8.2* 8.3* 8.3* 8.2*  MG  --   --  QUANTITY NOT SUFFICIENT, UNABLE TO PERFORM TEST 2.4 2.2  --   PHOS  --   --  5.5* 5.1* 5.0* 5.0*   GFR Estimated Creatinine Clearance: 8 mL/min (A) (by C-G formula based on SCr of 9.35 mg/dL (H)). Liver Function Tests: Recent Labs  Lab 03/27/23 1845 03/28/23 0556 03/28/23 0810 03/28/23 0900 03/29/23 0609 03/30/23 0908  AST 26 25  --   --  25  --   ALT 25 QUANTITY NOT SUFFICIENT, UNABLE TO PERFORM TEST  --  23 19  --   ALKPHOS 67 66  --   --  51  --   BILITOT 1.7* 1.5*  --   --  2.2*  --   PROT 7.3 6.9  --   --  6.4*  --   ALBUMIN 3.5 3.2* 3.3*  --  2.9* 2.7*   No results for input(s): "LIPASE", "AMYLASE" in the last 168 hours. No results for input(s): "AMMONIA" in the last 168 hours. Coagulation profile No results for input(s): "INR", "PROTIME" in the last 168 hours. COVID-19 Labs  No results for input(s): "DDIMER", "FERRITIN", "LDH", "CRP" in the last 72 hours.  Lab Results  Component Value Date   SARSCOV2NAA NEGATIVE 02/15/2023   SARSCOV2NAA NEGATIVE 06/24/2022    CBC: Recent Labs  Lab 03/27/23 1845 03/27/23 1945 03/28/23 0556 03/28/23 0810 03/29/23 0609 03/30/23 0908 03/30/23 2326  WBC 14.5*  --  17.6* 15.8* 9.4 10.9*  --   NEUTROABS  --   --   --  14.2* 5.5  --   --   HGB 8.0*   < > 8.0* 7.8* 7.3* 6.8* 9.4*  HCT 24.2*   < > 24.1* 23.6* 21.6* 20.5* 27.7*  MCV 98.4  --  96.0 96.7 95.6 94.9  --   PLT 222  --  223 178 168 181  --    < > = values in this interval not displayed.   Cardiac Enzymes: No results for input(s): "CKTOTAL", "CKMB", "CKMBINDEX", "TROPONINI" in  the last 168 hours. BNP (last 3 results) No results for input(s): "PROBNP" in the last 8760 hours. CBG: Recent Labs  Lab 03/30/23 0747 03/30/23 1418 03/30/23 1625 03/30/23 2054 03/31/23 0754  GLUCAP 96 83 182* 86 101*   D-Dimer: No results for input(s): "DDIMER" in the last 72 hours. Hgb A1c: No results  for input(s): "HGBA1C" in the last 72 hours. Lipid Profile: No results for input(s): "CHOL", "HDL", "LDLCALC", "TRIG", "CHOLHDL", "LDLDIRECT" in the last 72 hours. Thyroid function studies: No results for input(s): "TSH", "T4TOTAL", "T3FREE", "THYROIDAB" in the last 72 hours.  Invalid input(s): "FREET3" Anemia work up: No results for input(s): "VITAMINB12", "FOLATE", "FERRITIN", "TIBC", "IRON", "RETICCTPCT" in the last 72 hours. Sepsis Labs: Recent Labs  Lab 03/28/23 0556 03/28/23 0810 03/28/23 0832 03/29/23 0609 03/30/23 0908  PROCALCITON  --   --  6.48  --   --   WBC 17.6* 15.8*  --  9.4 10.9*   Microbiology Recent Results (from the past 240 hour(s))  Culture, blood (Routine X 2) w Reflex to ID Panel     Status: None (Preliminary result)   Collection Time: 03/28/23  2:01 PM   Specimen: BLOOD  Result Value Ref Range Status   Specimen Description BLOOD SITE NOT SPECIFIED  Final   Special Requests   Final    BOTTLES DRAWN AEROBIC ONLY Blood Culture results may not be optimal due to an inadequate volume of blood received in culture bottles   Culture   Final    NO GROWTH 3 DAYS Performed at [facility], [address], [place], [place] [address]    Report Status PENDING  Incomplete  Culture, blood (Routine X 2) w Reflex to ID Panel     Status: None (Preliminary result)   Collection Time: 03/28/23  2:01 PM   Specimen: BLOOD  Result Value Ref Range Status   Specimen Description BLOOD SITE NOT SPECIFIED  Final   Special Requests   Final    BOTTLES DRAWN AEROBIC ONLY Blood Culture results may not be optimal due to an inadequate volume of blood received in  culture bottles   Culture   Final    NO GROWTH 3 DAYS Performed at [facility], [address] [place], [place] [address]    Report Status PENDING  Incomplete     Medications:    amLODipine  10 mg Oral Daily   Chlorhexidine Gluconate Cloth  6 each Topical Q0600   Chlorhexidine Gluconate Cloth  6 each Topical Q0600   darbepoetin (ARANESP) injection - NON-DIALYSIS  100 mcg Subcutaneous Q Wed-1800   ferrous sulfate  325 mg Oral Q breakfast   heparin  5,000 Units Subcutaneous Q8H   hydrALAZINE  100 mg Oral TID   insulin aspart  0-6 Units Subcutaneous TID WC   vitamin B-12  100 mcg Oral Daily   Continuous Infusions:    LOS: 4 days   Marinda Elk  [facility]  03/31/2023, 10:20 AM

## 2023-03-31 NOTE — Plan of Care (Signed)

## 2023-03-31 NOTE — Progress Notes (Signed)
[facility] Progress Note   Subjective:   Pt seen in room, feels like his breathing in much better. On O2 L with plans to wean to RA today. Denies SOB, CP, dizziness ,nausea.   Objective Vitals:   03/30/23 2144 03/30/23 2146 03/31/23 0344 03/31/23 0756  BP: (!) 169/86 (!) 162/92 (!) 146/70 (!) 156/96  Pulse:  89 83 81  Resp:  19 15 15   Temp:  98.6 F (37 C) 98.2 F (36.8 C) 98.9 F (37.2 C)  TempSrc:  Oral Oral Oral  SpO2:  99% 99% 98%  Weight:   74.9 kg   Height:       Physical Exam General: Alert male in NAD Heart: RRR, no murmurs, rubs or gallops Lungs: CTA bilaterally Abdomen: Soft, non-distended, +BS Extremities: no edema b/l lower extremities Dialysis Access: LUE AVF + thrill/bruit  Additional Objective Labs: Basic Metabolic Panel: Recent Labs  Lab 03/28/23 0810 03/29/23 0609 03/30/23 0908  NA 136 135 136  K 3.9 3.5 3.5  CL 103 98 96*  CO2 18* 25 24  GLUCOSE 115* 100* 157*  BUN 77* 59* 73*  CREATININE 9.50* 7.88* 9.35*  CALCIUM 8.3* 8.3* 8.2*  PHOS 5.1* 5.0* 5.0*   Liver Function Tests: Recent Labs  Lab 03/27/23 1845 03/28/23 0556 03/28/23 0810 03/28/23 0900 03/29/23 0609 03/30/23 0908  AST 26 25  --   --  25  --   ALT 25 QUANTITY NOT SUFFICIENT, UNABLE TO PERFORM TEST  --  23 19  --   ALKPHOS 67 66  --   --  51  --   BILITOT 1.7* 1.5*  --   --  2.2*  --   PROT 7.3 6.9  --   --  6.4*  --   ALBUMIN 3.5 3.2* 3.3*  --  2.9* 2.7*   No results for input(s): "LIPASE", "AMYLASE" in the last 168 hours. CBC: Recent Labs  Lab 03/27/23 1845 03/27/23 1945 03/28/23 0556 03/28/23 0810 03/29/23 0609 03/30/23 0908 03/30/23 2326  WBC 14.5*  --  17.6* 15.8* 9.4 10.9*  --   NEUTROABS  --   --   --  14.2* 5.5  --   --   HGB 8.0*   < > 8.0* 7.8* 7.3* 6.8* 9.4*  HCT 24.2*   < > 24.1* 23.6* 21.6* 20.5* 27.7*  MCV 98.4  --  96.0 96.7 95.6 94.9  --   PLT 222  --  223 178 168 181  --    < > = values in this interval not displayed.   Blood  Culture    Component Value Date/Time   SDES BLOOD SITE NOT SPECIFIED 03/28/2023 1401   SDES BLOOD SITE NOT SPECIFIED 03/28/2023 1401   SPECREQUEST  03/28/2023 1401    BOTTLES DRAWN AEROBIC ONLY Blood Culture results may not be optimal due to an inadequate volume of blood received in culture bottles   SPECREQUEST  03/28/2023 1401    BOTTLES DRAWN AEROBIC ONLY Blood Culture results may not be optimal due to an inadequate volume of blood received in culture bottles   CULT  03/28/2023 1401    NO GROWTH 3 DAYS Performed at [facility], [address], [place], [place] [address]    CULT  03/28/2023 1401    NO GROWTH 3 DAYS Performed at [facility], [address] [place], [place] [address]    REPTSTATUS PENDING 03/28/2023 1401   REPTSTATUS PENDING 03/28/2023 1401    Cardiac  Enzymes: No results for input(s): "CKTOTAL", "CKMB", "CKMBINDEX", "TROPONINI" in the last 168 hours. CBG: Recent Labs  Lab 03/30/23 0747 03/30/23 1418 03/30/23 1625 03/30/23 2054 03/31/23 0754  GLUCAP 96 83 182* 86 101*   Iron Studies: No results for input(s): "IRON", "TIBC", "TRANSFERRIN", "FERRITIN" in the last 72 hours. @lablastinr3 @ Studies/Results: No results found. Medications:   amLODipine  10 mg Oral Daily   Chlorhexidine Gluconate Cloth  6 each Topical Q0600   Chlorhexidine Gluconate Cloth  6 each Topical Q0600   darbepoetin (ARANESP) injection - NON-DIALYSIS  100 mcg Subcutaneous Q Wed-1800   ferrous sulfate  325 mg Oral Q breakfast   furosemide  80 mg Oral Daily   heparin  5,000 Units Subcutaneous Q8H   hydrALAZINE  100 mg Oral TID   insulin aspart  0-6 Units Subcutaneous TID WC   labetalol  400 mg Oral BID   vitamin B-12  100 mcg Oral Daily    Assessment/Plan: 1.  Acute hypoxic respiratory failure: Secondary to pulmonary edema/volume overload in patient with ESRD.  Transitioned off of NIPPV and remains on oxygen via nasal cannula with attempts at volume unloading with  hemodialysis.  Restarted oral furosemide. Much improved, trying to wean to RA today.  2. ESRD: Continue Monday, Wednesday, Friday schedule 3. Anemia: Low hemoglobin/hematocrit without overt loss.  Continue to monitor on ESA. 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 5. Nutrition: Resume renal diet with fluid restriction and ongoing protein supplementation. 6. Hypertension: Blood pressure acceptable, continue to monitor with hemodialysis/UF  Rogers Blocker, PA-C 03/31/2023, 10:51 AM  [facility] Pager: [phone]

## 2023-03-31 NOTE — Evaluation (Signed)
Occupational Therapy Evaluation/Discharge Patient Details Name: Matthew Robles MRN: [id number] DOB: 1956-04-01 Today's Date: 03/31/2023   History of Present Illness Pt is a 67 y/o male admitted for acute respiratory failure with hypoxia in setting of pulmonary edema and SIRS. Pt had run out of Lasix prior to admission. PMH: ESRD on HD, anemia, HTN, DM2, CVA   Clinical Impression   PTA, pt lives with mother and typically completely Independent in all ADLs, IADLs, driving and mobility. Pt presents now with minor dynamic standing balance deficits w/ long distance mobility though pt able to self correct any LOB. Overall, pt able to manage ADLs in room with Modified Independence w/o safety concerns. Discussed gradual progression of endurance, energy conservation and adequate O2 levels. No further skilled OT services needed but recommend mobility specialist's service to maximize endurance/balance while admitted.        If plan is discharge home, recommend the following:      Functional Status Assessment  Patient has had a recent decline in their functional status and demonstrates the ability to make significant improvements in function in a reasonable and predictable amount of time.  Equipment Recommendations  None recommended by OT    Recommendations for Other Services       Precautions / Restrictions Precautions Precautions: Other (comment) Precaution Comments: monitor O2, relatively low fall risk Restrictions Weight Bearing Restrictions: No      Mobility Bed Mobility Overal bed mobility: Modified Independent                  Transfers Overall transfer level: Independent Equipment used: None                      Balance Overall balance assessment: Mild deficits observed, not formally tested                                         ADL either performed or assessed with clinical judgement   ADL Overall ADL's : Needs  assistance/impaired Eating/Feeding: Independent   Grooming: Modified independent;Standing   Upper Body Bathing: Modified independent   Lower Body Bathing: Modified independent   Upper Body Dressing : Modified independent   Lower Body Dressing: Modified independent   Toilet Transfer: Supervision/safety;Ambulation   Toileting- Clothing Manipulation and Hygiene: Modified independent;Sit to/from stand       Functional mobility during ADLs: Supervision/safety General ADL Comments: minor imbalance noted with long distance ambulation but pt able to self correct without OT assist. Discussed routine, fluid restrictions, medication adherence and pacing/prioritizing     Vision Ability to See in Adequate Light: 0 Adequate Patient Visual Report: No change from baseline Vision Assessment?: No apparent visual deficits     Perception         Praxis         Pertinent Vitals/Pain Pain Assessment Pain Assessment: No/denies pain     Extremity/Trunk Assessment Upper Extremity Assessment Upper Extremity Assessment: Overall WFL for tasks assessed;Right hand dominant   Lower Extremity Assessment Lower Extremity Assessment: Defer to PT evaluation   Cervical / Trunk Assessment Cervical / Trunk Assessment: Normal   Communication Communication Communication: No apparent difficulties   Cognition Arousal: Alert Behavior During Therapy: WFL for tasks assessed/performed Overall Cognitive Status: Within Functional Limits for tasks assessed  General Comments       Exercises     Shoulder Instructions      Home Living Family/patient expects to be discharged to:: Private residence Living Arrangements: Parent Available Help at Discharge: Family Type of Home: House Home Access: Stairs to enter Secretary/administrator of Steps: 3 Entrance Stairs-Rails: Right Home Layout: One level     Bathroom Shower/Tub: Scientist, research (life sciences): Standard Bathroom Accessibility: Yes How Accessible: Accessible via walker Home Equipment: None          Prior Functioning/Environment Prior Level of Function : Independent/Modified Independent;Driving             Mobility Comments: no AD, ambulates in community ([employer], flea markets, etc) ADLs Comments: Independent with ADLs, IADLs, driving, traveling to [place] at times        OT Problem List: Impaired balance (sitting and/or standing)      OT Treatment/Interventions:      OT Goals(Current goals can be found in the care plan section) Acute Rehab OT Goals Patient Stated Goal: keep fluid off, home soon OT Goal Formulation: With patient  OT Frequency:      Co-evaluation              AM-PAC OT "6 Clicks" Daily Activity     Outcome Measure Help from another person eating meals?: None Help from another person taking care of personal grooming?: None Help from another person toileting, which includes using toliet, bedpan, or urinal?: None Help from another person bathing (including washing, rinsing, drying)?: None Help from another person to put on and taking off regular upper body clothing?: None Help from another person to put on and taking off regular lower body clothing?: None 6 Click Score: 24   End of Session Equipment Utilized During Treatment: Gait belt Nurse Communication: Mobility status;Other (comment) (O2)  Activity Tolerance: Patient tolerated treatment well Patient left: in bed;with call bell/phone within reach  OT Visit Diagnosis: Unsteadiness on feet (R26.81)                Time: 1610-9604 OT Time Calculation (min): 38 min Charges:  OT General Charges $OT Visit: 1 Visit OT Evaluation $OT Eval Low Complexity: 1 Low OT Treatments $Therapeutic Activity: 8-22 mins  Bradd Canary, OTR/L Acute Rehab Services Office: [phone]   Lorre Munroe 03/31/2023, 1:47 PM

## 2023-03-31 NOTE — Evaluation (Signed)
Physical Therapy Evaluation Patient Details Name: Matthew Robles MRN: [id number] DOB: 06/23/1955 Today's Date: 03/31/2023  History of Present Illness  Pt is a 67 y/o male admitted for acute respiratory failure with hypoxia in setting of pulmonary edema and SIRS. Pt had run out of Lasix prior to admission. PMH: ESRD on HD, anemia, HTN, DM2, CVA  Clinical Impression  Pt is at or close to baseline functioning and should be safe at home. There are no further acute PT needs.  Will sign off at this time.         If plan is discharge home, recommend the following:  (PRN assist)   Can travel by private vehicle        Equipment Recommendations None recommended by PT  Recommendations for Other Services       Functional Status Assessment Patient has had a recent decline in their functional status and demonstrates the ability to make significant improvements in function in a reasonable and predictable amount of time.     Precautions / Restrictions Precautions Precautions: Other (comment) Precaution Comments: monitor O2, relatively low fall risk Restrictions Weight Bearing Restrictions: No      Mobility  Bed Mobility Overal bed mobility: Modified Independent                  Transfers Overall transfer level: Independent Equipment used: None                    Ambulation/Gait Ambulation/Gait assistance: Independent Gait Distance (Feet): 400 Feet Assistive device: None Gait Pattern/deviations: Step-through pattern   Gait velocity interpretation: 1.31 - 2.62 ft/sec, indicative of limited community ambulator   General Gait Details: Generally steady if kept to a slower pace.  With increase in speed, abrupt directional changes and scanning, pt present with deviations, but no unrecoverable LOB  Stairs Stairs: Yes   Stair Management: One rail Left, Alternating pattern, Forwards Number of Stairs: 5 General stair comments: safe with the rail  Wheelchair  Mobility     Tilt Bed    Modified Rankin (Stroke Patients Only)       Balance Overall balance assessment: Mild deficits observed, not formally tested                                           Pertinent Vitals/Pain      Home Living Family/patient expects to be discharged to:: Private residence Living Arrangements: Parent Available Help at Discharge: Family Type of Home: House Home Access: Stairs to enter Entrance Stairs-Rails: Right Entrance Stairs-Number of Steps: 3   Home Layout: One level Home Equipment: None      Prior Function Prior Level of Function : Independent/Modified Independent;Driving             Mobility Comments: no AD, ambulates in community ([employer], flea markets, etc) ADLs Comments: Independent with ADLs, IADLs, driving, traveling to [facility] at times     Extremity/Trunk Assessment   Upper Extremity Assessment Upper Extremity Assessment: Overall WFL for tasks assessed    Lower Extremity Assessment Lower Extremity Assessment: Overall WFL for tasks assessed    Cervical / Trunk Assessment Cervical / Trunk Assessment: Normal  Communication   Communication Communication: No apparent difficulties  Cognition Arousal: Alert Behavior During Therapy: WFL for tasks assessed/performed Overall Cognitive Status: Within Functional Limits for tasks assessed  General Comments      Exercises     Assessment/Plan    PT Assessment Patient does not need any further PT services  PT Problem List         PT Treatment Interventions      PT Goals (Current goals can be found in the Care Plan section)  Acute Rehab PT Goals Patient Stated Goal: independent PT Goal Formulation: All assessment and education complete, DC therapy    Frequency       Co-evaluation               AM-PAC PT "6 Clicks" Mobility  Outcome Measure Help needed turning from your back to your  side while in a flat bed without using bedrails?: None Help needed moving from lying on your back to sitting on the side of a flat bed without using bedrails?: None Help needed moving to and from a bed to a chair (including a wheelchair)?: None Help needed standing up from a chair using your arms (e.g., wheelchair or bedside chair)?: None Help needed to walk in hospital room?: A Little Help needed climbing 3-5 steps with a railing? : A Little 6 Click Score: 22    End of Session   Activity Tolerance: Patient tolerated treatment well Patient left: in bed;with call bell/phone within reach Nurse Communication: Mobility status PT Visit Diagnosis: Unsteadiness on feet (R26.81)    Time: 1610-9604 PT Time Calculation (min) (ACUTE ONLY): 23 min   Charges:   PT Evaluation $PT Eval Low Complexity: 1 Low PT Treatments $Gait Training: 8-22 mins PT General Charges $$ ACUTE PT VISIT: 1 Visit         03/31/2023  Jacinto Halim., PT Acute Rehabilitation Services [phone]  (office)  Eliseo Gum Shalayne Leach 03/31/2023, 6:41 PM

## 2023-04-01 DIAGNOSIS — I5043 Acute on chronic combined systolic (congestive) and diastolic (congestive) heart failure: Secondary | ICD-10-CM | POA: Diagnosis not present

## 2023-04-01 DIAGNOSIS — J810 Acute pulmonary edema: Secondary | ICD-10-CM | POA: Diagnosis not present

## 2023-04-01 DIAGNOSIS — J9601 Acute respiratory failure with hypoxia: Secondary | ICD-10-CM | POA: Diagnosis not present

## 2023-04-01 LAB — RENAL FUNCTION PANEL
Albumin: 3 g/dL — ABNORMAL LOW (ref 3.5–5.0)
Anion gap: 12 (ref 5–15)
BUN: 68 mg/dL — ABNORMAL HIGH (ref 8–23)
CO2: 22 mmol/L (ref 22–32)
Calcium: 8.3 mg/dL — ABNORMAL LOW (ref 8.9–10.3)
Chloride: 98 mmol/L (ref 98–111)
Creatinine, Ser: 9.74 mg/dL — ABNORMAL HIGH (ref 0.61–1.24)
GFR, Estimated: 5 mL/min — ABNORMAL LOW (ref >60)
Glucose, Bld: 130 mg/dL — ABNORMAL HIGH (ref 70–99)
Phosphorus: 5.1 mg/dL — ABNORMAL HIGH (ref 2.5–4.6)
Potassium: 3.7 mmol/L (ref 3.5–5.1)
Sodium: 132 mmol/L — ABNORMAL LOW (ref 135–145)

## 2023-04-01 LAB — CBC
HCT: 30.1 % — ABNORMAL LOW (ref 39.0–52.0)
Hemoglobin: 9.7 g/dL — ABNORMAL LOW (ref 13.0–17.0)
MCH: 30.2 pg (ref 26.0–34.0)
MCHC: 32.2 g/dL (ref 30.0–36.0)
MCV: 93.8 fL (ref 80.0–100.0)
Platelets: 214 10*3/uL (ref 150–400)
RBC: 3.21 MIL/uL — ABNORMAL LOW (ref 4.22–5.81)
RDW: 15.6 % — ABNORMAL HIGH (ref 11.5–15.5)
WBC: 9.4 10*3/uL (ref 4.0–10.5)
nRBC: 0 % (ref 0.0–0.2)

## 2023-04-01 LAB — GLUCOSE, CAPILLARY
Glucose-Capillary: 100 mg/dL — ABNORMAL HIGH (ref 70–99)
Glucose-Capillary: 169 mg/dL — ABNORMAL HIGH (ref 70–99)

## 2023-04-01 MED ORDER — FUROSEMIDE 80 MG PO TABS: 80.0000 mg | ORAL_TABLET | Freq: Every day | ORAL | 1 refills | Status: DC

## 2023-04-01 MED ORDER — AMLODIPINE BESYLATE 10 MG PO TABS: 10.0000 mg | ORAL_TABLET | Freq: Every day | ORAL | 1 refills | Status: DC

## 2023-04-01 MED ORDER — LABETALOL HCL 200 MG PO TABS: 400.0000 mg | ORAL_TABLET | Freq: Two times a day (BID) | ORAL | 0 refills | Status: DC

## 2023-04-01 MED ORDER — HYDRALAZINE HCL 100 MG PO TABS: 100.0000 mg | ORAL_TABLET | Freq: Three times a day (TID) | ORAL | 0 refills | Status: DC

## 2023-04-01 NOTE — Progress Notes (Addendum)
D/C summary noted. Contacted attending and nephrology staff to inquire if pt to d/c today would pt be appropriate for out-pt HD later today or tomorrow if clinic can accommodate. Advised that pt has not been d/c. Will advise pt's clinic of pt's d/c date once known/confirmed.   Olivia Canter Renal Navigator [phone]  Addendum at 2:37 pm: Contacted [facility] to advise clinic that pt will likely d/c later today or weekend to possibly resume on Monday. Will fax d/c summary and today's renal note for continuation of care.

## 2023-04-01 NOTE — Progress Notes (Signed)
Discharge teaching complete. Meds, diet, activity, follow up appointments reviewed and all questions answered. Copy of instructions given to patient and prescriptions sent to the pharmacy. Patient very upset about having to have stayed in the hospital and feels as if we have not done anything for him. Dr. Radonna Ricker made aware of his complaints. Patient discharged to main lobby via wheelchair to wait on his ride per patient's request.

## 2023-04-01 NOTE — Progress Notes (Addendum)
[facility] Progress Note     Admit: 03/27/2023 LOS: 5  Matthew Robles is a 67 y.o. male past medical history significant for essential hypertension and end-stage renal disease on hemodialysis Monday, Wednesday, Friday, diabetes mellitus type 2, recently discharged from the hospital 02/20/2023 for pulmonary edema with 12 L removed via ultrafiltration over the course of 3 days, history of strokes comes into the hospital via EMS for respiratory distress, he relates he ran out of his Lasix about a week prior to admission. In the ED was placed on CPAP.   Subjective:  Patient is resting in bed comfortably. He endorses frustration with the HD treatment process this hospitalization with having to wait for treatment. He has no new concerns today, and is eager for discharge from hospital. Patient denies chest pain, shortness of breath, abdominal pain, N/V/D, or led swelling. He is breathing well on room air. He is continuing to make urine. Patient informed that he will need HD inpatient prior to discharge today.   11/07 0701 - 11/08 0700 In: 360 [P.O.:360] Out: 850 [Urine:850]  Filed Weights   03/30/23 1305 03/31/23 0344 04/01/23 0335  Weight: 74.5 kg 74.9 kg 74.8 kg    Scheduled Meds:  amLODipine  10 mg Oral Daily   darbepoetin (ARANESP) injection - NON-DIALYSIS  100 mcg Subcutaneous Q Wed-1800   ferrous sulfate  325 mg Oral Q breakfast   furosemide  80 mg Oral Daily   heparin  5,000 Units Subcutaneous Q8H   hydrALAZINE  100 mg Oral TID   insulin aspart  0-6 Units Subcutaneous TID WC   labetalol  400 mg Oral BID   vitamin B-12  100 mcg Oral Daily   Continuous Infusions: PRN Meds:.acetaminophen **OR** acetaminophen, calcium carbonate, ondansetron **OR** ondansetron (ZOFRAN) IV  Current Labs: reviewed renal function panel and CBC last updated 11/06.   Physical Exam:  Blood pressure (!) 159/71, pulse 77, temperature 98.6 F (37 C), temperature source Oral, resp. rate 19,  height 6' (1.829 m), weight 74.8 kg, SpO2 97%. GEN: Alert male laying in bed with NAD.  EYES: no scleral icterus, eomi CV: RRR, no murmurs PULM: no iwob, bilateral chest rise and CTAB. ABD: NABS, non-distended SKIN: no rashes or jaundice EXT: no LE edema, warm and well perfused Dialysis Access: LUE AVF + thrill/bruit   04/01/2023, 11:33 AM  Recent Labs  Lab 03/28/23 0810 03/29/23 0609 03/30/23 0908  NA 136 135 136  K 3.9 3.5 3.5  CL 103 98 96*  CO2 18* 25 24  GLUCOSE 115* 100* 157*  BUN 77* 59* 73*  CREATININE 9.50* 7.88* 9.35*  CALCIUM 8.3* 8.3* 8.2*  PHOS 5.1* 5.0* 5.0*   Recent Labs  Lab 03/28/23 0810 03/29/23 0609 03/30/23 0908 03/30/23 2326  WBC 15.8* 9.4 10.9*  --   NEUTROABS 14.2* 5.5  --   --   HGB 7.8* 7.3* 6.8* 9.4*  HCT 23.6* 21.6* 20.5* 27.7*  MCV 96.7 95.6 94.9  --   PLT 178 168 181  --    Outpt HD Orders -[facility] at [facility] since 06/[facility]22 on MWF schedule.  Assessment/Plan 1.  Acute hypoxic respiratory failure, resolved: Secondary to pulmonary edema/volume overload in patient with ESRD.  Transitioned off of NIPPV to [place] until yesterday and now has been weaned to RA with O2 saturation of 97% this morning. Volume unloading with hemodialysis.  Restarted oral furosemide 80 mg PO and prescription provided to patient while in hospital.  2. ESRD: Continue Monday, Wednesday, Friday schedule.  Patient will receive last HD treatment here prior to discharge. Inquired about discharging prior to HD with option for outpatient HD treatment today, but ultimately the plan was decided to complete HD treatment inpatient prior to discharge. Patient made aware that timing of HD depends on staffing, critical patients, etc.  -Pt. Will discharge after dialysis today. Resume MWF dialysis with [facility] .  3. Anemia: Low hemoglobin/hematocrit without overt loss. Most recent Hbg of 9.4. Continue to monitor on ESA. Aranesp q. Wed. 4. CKD-MBD: Calcium and phosphorus level  acceptable, continue low phosphorus diet with binder. 5. Nutrition: Resume renal diet with fluid restriction and ongoing protein supplementation. 6. Hypertension/Volume: Blood pressure acceptable.  Does not appear volume overloaded.  Continue UF as tolerated.  Progress note written with assistance by PA student.  This PA performed own history and physical exam.  Plan discussed with student and documented.  Virgina Norfolk, PA-C [employer]

## 2023-04-01 NOTE — Discharge Summary (Addendum)
Physician Discharge Summary  Matthew Robles ZOX:[id number] DOB: 1955-11-02 DOA: 03/27/2023  PCP: [facility]  Admit date: 03/27/2023 Discharge date: 04/01/2023  Admitted From: Home Disposition:  Home  Recommendations for Outpatient Follow-up:  Follow up with Renal in 1-2 weeks Please obtain BMP/CBC in one week   Home Health:nO Equipment/Devices:None  Discharge Condition:Stable CODE STATUS:Full Diet recommendation: Heart Healthy  Brief/Interim Summary:  67 y.o. male past medical history significant for essential hypertension and end-stage renal disease on hemodialysis Monday, Wednesday, Friday, diabetes mellitus type 2, recently discharged from the hospital 02/20/2023 for pulmonary edema with 12 L removed via ultrafiltration over the course of 3 days, history of strokes comes into the hospital via EMS for respiratory distress, he relates he ran out of his Lasix about a week prior to admission.  In the ED was placed on CPAP   Discharge Diagnoses:  Principal Problem:   Acute on chronic combined systolic and diastolic CHF (congestive heart failure) (HCC) Active Problems:   End-stage renal disease on hemodialysis (HCC)   Acute respiratory failure with hypoxia (HCC)   Anemia of chronic disease   Elevated brain natriuretic peptide (BNP) level   Elevated troponin   Controlled type 2 diabetes mellitus without complication, without long-term current use of insulin (HCC)   Essential hypertension  Acute respiratory failure with hypoxia likely due to pulmonary edema: Likely due to noncompliance with his medication. He was started on dialysis negative about 6 L. We were able to wean him to room air. His medications were refilled as he had none in his house.  SIRS: Likely due to volume overload.  End-stage renal disease: He will continue dialysis Monday Wednesdays and Fridays as an outpatient. He was given a prescription for his home dose of Lasix and other  antihypertensive medication.  Hemoptysis: He relates there is chronic high resolution CT scan showed pulmonary edema.  Anemia of chronic renal disease: Continue further management per renal as an outpatient.  Essential hypertension: His antihypertensive medications were resumed his blood pressure is improved.  Diabetes mellitus type 2: No changes made to his medication.   Discharge Instructions   Allergies as of 04/01/2023   No Known Allergies      Medication List     TAKE these medications    amLODipine 10 MG tablet Commonly known as: NORVASC Take 1 tablet (10 mg total) by mouth daily.   calcitRIOL 0.5 MCG capsule Commonly known as: ROCALTROL Take 1 capsule (0.5 mcg total) by mouth daily.   calcium carbonate 500 MG chewable tablet Commonly known as: TUMS - dosed in mg elemental calcium Chew 2 tablets by mouth every dialysis for indigestion or heartburn.   FeroSul 325 (65 Fe) MG tablet Generic drug: ferrous sulfate Take 325 mg by mouth daily with breakfast.   furosemide 80 MG tablet Commonly known as: LASIX Take 1 tablet (80 mg total) by mouth daily.   hydrALAZINE 100 MG tablet Commonly known as: APRESOLINE Take 1 tablet (100 mg total) by mouth 3 (three) times daily.   labetalol 200 MG tablet Commonly known as: NORMODYNE Take 2 tablets (400 mg total) by mouth 2 (two) times daily.   lidocaine-prilocaine cream Commonly known as: EMLA Apply 1 Application topically.   magnesium gluconate 500 MG tablet Commonly known as: MAGONATE Take 500 mg by mouth 2 (two) times daily.   vitamin B-12 100 MCG tablet Commonly known as: CYANOCOBALAMIN Take 100 mcg by mouth daily.        No Known Allergies  Consultations: Nephrology   Procedures/Studies: ECHOCARDIOGRAM COMPLETE  Result Date: 03/29/2023    ECHOCARDIOGRAM REPORT   Patient Name:   [name] Date of Exam: 03/29/2023 Medical Rec #:  [id number]       Height:       72.0 in Accession #:     [id number]      Weight:       168.8 lb Date of Birth:  November 23, 1955       BSA:          1.982 m Patient Age:    67 years        BP:           138/56 mmHg Patient Gender: M               HR:           84 bpm. Exam Location:  Inpatient Procedure: 2D Echo, Color Doppler and Cardiac Doppler Indications:    CHF  History:        Patient has prior history of Echocardiogram examinations, most                 recent 06/24/2022. CHF and elevated BNP, elevated troponin, ESRD,                 and Stroke; Risk Factors:Hypertension, Diabetes and                 Dyslipidemia.  Sonographer:    Milbert Coulter Referring Phys: [id number] OLADAPO ADEFESO IMPRESSIONS  1. Left ventricular ejection fraction, by estimation, is 45 to 50%. The left ventricle has mildly decreased function. The left ventricle demonstrates global hypokinesis. There is moderate concentric left ventricular hypertrophy. Left ventricular diastolic parameters are consistent with Grade III diastolic dysfunction (restrictive). Elevated left atrial pressure. The E/e' is 27.4.  2. Right ventricular systolic function is mildly reduced. The right ventricular size is mildly enlarged. There is severely elevated pulmonary artery systolic pressure. The estimated right ventricular systolic pressure is 61.0 mmHg.  3. Left atrial size was severely dilated.  4. Right atrial size was severely dilated.  5. The mitral valve is grossly normal. Mild mitral valve regurgitation. No evidence of mitral stenosis.  6. The aortic valve is tricuspid. There is mild calcification of the aortic valve. There is mild thickening of the aortic valve. Aortic valve regurgitation is trivial. Aortic valve sclerosis is present, with no evidence of aortic valve stenosis.  7. The inferior vena cava is dilated in size with <50% respiratory variability, suggesting right atrial pressure of 15 mmHg. Comparison(s): Changes from prior study are noted. LVEF unchanged. PA pressures severely elevated. FINDINGS  Left  Ventricle: Left ventricular ejection fraction, by estimation, is 45 to 50%. The left ventricle has mildly decreased function. The left ventricle demonstrates global hypokinesis. The left ventricular internal cavity size was normal in size. There is  moderate concentric left ventricular hypertrophy. Left ventricular diastolic parameters are consistent with Grade III diastolic dysfunction (restrictive). Elevated left atrial pressure. The E/e' is 27.4. Right Ventricle: The right ventricular size is mildly enlarged. No increase in right ventricular wall thickness. Right ventricular systolic function is mildly reduced. There is severely elevated pulmonary artery systolic pressure. The tricuspid regurgitant velocity is 3.39 m/s, and with an assumed right atrial pressure of 15 mmHg, the estimated right ventricular systolic pressure is 61.0 mmHg. Left Atrium: Left atrial size was severely dilated. Right Atrium: Right atrial size was severely dilated. Pericardium: Trivial pericardial effusion is present. Mitral Valve: The  mitral valve is grossly normal. Mild mitral valve regurgitation. No evidence of mitral valve stenosis. Tricuspid Valve: The tricuspid valve is grossly normal. Tricuspid valve regurgitation is mild . No evidence of tricuspid stenosis. Aortic Valve: The aortic valve is tricuspid. There is mild calcification of the aortic valve. There is mild thickening of the aortic valve. Aortic valve regurgitation is trivial. Aortic valve sclerosis is present, with no evidence of aortic valve stenosis. Aortic valve mean gradient measures 7.0 mmHg. Aortic valve peak gradient measures 12.7 mmHg. Aortic valve area, by VTI measures 2.44 cm. Pulmonic Valve: The pulmonic valve was grossly normal. Pulmonic valve regurgitation is not visualized. No evidence of pulmonic stenosis. Aorta: The aortic root and ascending aorta are structurally normal, with no evidence of dilitation. Venous: The inferior vena cava is dilated in size with  less than 50% respiratory variability, suggesting right atrial pressure of 15 mmHg. IAS/Shunts: The atrial septum is grossly normal.  LEFT VENTRICLE PLAX 2D LVIDd:         4.90 cm   Diastology LVIDs:         3.40 cm   LV e' medial:    5.44 cm/s LV PW:         1.50 cm   LV E/e' medial:  27.4 LV IVS:        1.40 cm   LV e' lateral:   10.90 cm/s LVOT diam:     2.20 cm   LV E/e' lateral: 13.7 LV SV:         86 LV SV Index:   44 LVOT Area:     3.80 cm  RIGHT VENTRICLE RV Basal diam:  4.30 cm RV Mid diam:    2.50 cm RV S prime:     15.30 cm/s TAPSE (M-mode): 1.7 cm LEFT ATRIUM              Index        RIGHT ATRIUM           Index LA diam:        5.30 cm  2.67 cm/m   RA Area:     28.10 cm LA Vol (A2C):   137.0 ml 69.11 ml/m  RA Volume:   104.00 ml 52.46 ml/m LA Vol (A4C):   98.7 ml  49.79 ml/m LA Biplane Vol: 121.0 ml 61.04 ml/m  AORTIC VALVE AV Area (Vmax):    2.43 cm AV Area (Vmean):   2.38 cm AV Area (VTI):     2.44 cm AV Vmax:           178.00 cm/s AV Vmean:          121.000 cm/s AV VTI:            0.354 m AV Peak Grad:      12.7 mmHg AV Mean Grad:      7.0 mmHg LVOT Vmax:         114.00 cm/s LVOT Vmean:        75.700 cm/s LVOT VTI:          0.227 m LVOT/AV VTI ratio: 0.64  AORTA Ao Root diam: 3.50 cm Ao Asc diam:  3.50 cm MITRAL VALVE                TRICUSPID VALVE MV Area (PHT): 4.86 cm     TR Peak grad:   46.0 mmHg MV Decel Time: 156 msec     TR Vmax:        339.00 cm/s  MV E velocity: 149.00 cm/s MV A velocity: 71.90 cm/s   SHUNTS MV E/A ratio:  2.07         Systemic VTI:  0.23 m                             Systemic Diam: 2.20 cm Lennie Odor MD Electronically signed by Lennie Odor MD Signature Date/Time: 03/29/2023/8:33:30 AM    Final    DG Chest Portable 1 View  Result Date: 03/27/2023 CLINICAL DATA:  Respiratory distress EXAM: PORTABLE CHEST 1 VIEW COMPARISON:  02/15/2023 FINDINGS: Patchy bilateral lower lobe opacities, right greater than left, suspicious for pneumonia. Asymmetric interstitial  edema is considered unlikely. Suspected small right pleural effusion. No pneumothorax. Mild cardiomegaly. IMPRESSION: Patchy bilateral lower lobe opacities, right greater than left, suspicious for pneumonia. Suspected small right pleural effusion. Electronically Signed   By: Charline Bills M.D.   On: 03/27/2023 19:12   (Echo, Carotid, EGD, Colonoscopy, ERCP)    Subjective: No complains  Discharge Exam: Vitals:   04/01/23 0335 04/01/23 0730  BP: (!) 143/72 (!) 159/71  Pulse: 77 77  Resp: 10 19  Temp: 98.6 F (37 C) 98.6 F (37 C)  SpO2: 92% 97%   Vitals:   03/31/23 1601 03/31/23 1939 04/01/23 0335 04/01/23 0730  BP: (!) 167/88 (!) 171/81 (!) 143/72 (!) 159/71  Pulse: 86 82 77 77  Resp: 18 11 10 19   Temp: 99.1 F (37.3 C) 99.1 F (37.3 C) 98.6 F (37 C) 98.6 F (37 C)  TempSrc: Oral Oral Oral Oral  SpO2: 95% 95% 92% 97%  Weight:   74.8 kg   Height:        General: Pt is alert, awake, not in acute distress Cardiovascular: RRR, S1/S2 +, no rubs, no gallops Respiratory: CTA bilaterally, no wheezing, no rhonchi Abdominal: Soft, NT, ND, bowel sounds + Extremities: no edema, no cyanosis    The results of significant diagnostics from this hospitalization (including imaging, microbiology, ancillary and laboratory) are listed below for reference.     Microbiology: Recent Results (from the past 240 hour(s))  Culture, blood (Routine X 2) w Reflex to ID Panel     Status: None (Preliminary result)   Collection Time: 03/28/23  2:01 PM   Specimen: BLOOD  Result Value Ref Range Status   Specimen Description BLOOD SITE NOT SPECIFIED  Final   Special Requests   Final    BOTTLES DRAWN AEROBIC ONLY Blood Culture results may not be optimal due to an inadequate volume of blood received in culture bottles   Culture   Final    NO GROWTH 4 DAYS Performed at [facility], [address] [place], [place] [address]    Report Status PENDING  Incomplete  Culture, blood (Routine X  2) w Reflex to ID Panel     Status: None (Preliminary result)   Collection Time: 03/28/23  2:01 PM   Specimen: BLOOD  Result Value Ref Range Status   Specimen Description BLOOD SITE NOT SPECIFIED  Final   Special Requests   Final    BOTTLES DRAWN AEROBIC ONLY Blood Culture results may not be optimal due to an inadequate volume of blood received in culture bottles   Culture   Final    NO GROWTH 4 DAYS Performed at [facility], [address], [place], [place] [address]    Report Status PENDING  Incomplete     Labs:  BNP (last 3 results) Recent Labs    06/25/22 0427 02/15/23 1935 03/27/23 1845  BNP 2,536.0* >4,500.0* 2,781.0*   Basic Metabolic Panel: Recent Labs  Lab 03/27/23 1845 03/27/23 1945 03/28/23 0556 03/28/23 0810 03/29/23 0609 03/30/23 0908  NA 133* 136 135 136 135 136  K 3.6 3.8 3.7 3.9 3.5 3.5  CL 99 100 102 103 98 96*  CO2 18*  --  16* 18* 25 24  GLUCOSE 209* 206* 149* 115* 100* 157*  BUN 71* 74* 79* 77* 59* 73*  CREATININE 8.55* 9.50* 9.70* 9.50* 7.88* 9.35*  CALCIUM 8.0*  --  8.2* 8.3* 8.3* 8.2*  MG  --   --  QUANTITY NOT SUFFICIENT, UNABLE TO PERFORM TEST 2.4 2.2  --   PHOS  --   --  5.5* 5.1* 5.0* 5.0*   Liver Function Tests: Recent Labs  Lab 03/27/23 1845 03/28/23 0556 03/28/23 0810 03/28/23 0900 03/29/23 0609 03/30/23 0908  AST 26 25  --   --  25  --   ALT 25 QUANTITY NOT SUFFICIENT, UNABLE TO PERFORM TEST  --  23 19  --   ALKPHOS 67 66  --   --  51  --   BILITOT 1.7* 1.5*  --   --  2.2*  --   PROT 7.3 6.9  --   --  6.4*  --   ALBUMIN 3.5 3.2* 3.3*  --  2.9* 2.7*   No results for input(s): "LIPASE", "AMYLASE" in the last 168 hours. No results for input(s): "AMMONIA" in the last 168 hours. CBC: Recent Labs  Lab 03/27/23 1845 03/27/23 1945 03/28/23 0556 03/28/23 0810 03/29/23 0609 03/30/23 0908 03/30/23 2326  WBC 14.5*  --  17.6* 15.8* 9.4 10.9*  --   NEUTROABS  --   --   --  14.2* 5.5  --   --   HGB 8.0*   < > 8.0* 7.8*  7.3* 6.8* 9.4*  HCT 24.2*   < > 24.1* 23.6* 21.6* 20.5* 27.7*  MCV 98.4  --  96.0 96.7 95.6 94.9  --   PLT 222  --  223 178 168 181  --    < > = values in this interval not displayed.   Cardiac Enzymes: No results for input(s): "CKTOTAL", "CKMB", "CKMBINDEX", "TROPONINI" in the last 168 hours. BNP: Invalid input(s): "POCBNP" CBG: Recent Labs  Lab 03/31/23 0754 03/31/23 1202 03/31/23 1559 03/31/23 2056 04/01/23 0729  GLUCAP 101* 96 164* 122* 100*   D-Dimer No results for input(s): "DDIMER" in the last 72 hours. Hgb A1c No results for input(s): "HGBA1C" in the last 72 hours. Lipid Profile No results for input(s): "CHOL", "HDL", "LDLCALC", "TRIG", "CHOLHDL", "LDLDIRECT" in the last 72 hours. Thyroid function studies No results for input(s): "TSH", "T4TOTAL", "T3FREE", "THYROIDAB" in the last 72 hours.  Invalid input(s): "FREET3" Anemia work up No results for input(s): "VITAMINB12", "FOLATE", "FERRITIN", "TIBC", "IRON", "RETICCTPCT" in the last 72 hours. Urinalysis    Component Value Date/Time   COLORURINE STRAW (A) 12/25/2020 2057   APPEARANCEUR CLEAR 12/25/2020 2057   LABSPEC 1.006 12/25/2020 2057   PHURINE 8.0 12/25/2020 2057   GLUCOSEU 50 (A) 12/25/2020 2057   HGBUR SMALL (A) 12/25/2020 2057   BILIRUBINUR NEGATIVE 12/25/2020 2057   KETONESUR NEGATIVE 12/25/2020 2057   PROTEINUR 30 (A) 12/25/2020 2057   NITRITE NEGATIVE 12/25/2020 2057   LEUKOCYTESUR NEGATIVE 12/25/2020 2057   Sepsis Labs Recent Labs  Lab 03/28/23 0556 03/28/23 0810 03/29/23 0609 03/30/23 0908  WBC  17.6* 15.8* 9.4 10.9*   Microbiology Recent Results (from the past 240 hour(s))  Culture, blood (Routine X 2) w Reflex to ID Panel     Status: None (Preliminary result)   Collection Time: 03/28/23  2:01 PM   Specimen: BLOOD  Result Value Ref Range Status   Specimen Description BLOOD SITE NOT SPECIFIED  Final   Special Requests   Final    BOTTLES DRAWN AEROBIC ONLY Blood Culture results may not  be optimal due to an inadequate volume of blood received in culture bottles   Culture   Final    NO GROWTH 4 DAYS Performed at [facility], [address], [place], [place] [address]    Report Status PENDING  Incomplete  Culture, blood (Routine X 2) w Reflex to ID Panel     Status: None (Preliminary result)   Collection Time: 03/28/23  2:01 PM   Specimen: BLOOD  Result Value Ref Range Status   Specimen Description BLOOD SITE NOT SPECIFIED  Final   Special Requests   Final    BOTTLES DRAWN AEROBIC ONLY Blood Culture results may not be optimal due to an inadequate volume of blood received in culture bottles   Culture   Final    NO GROWTH 4 DAYS Performed at [facility], [address] [place], [place] [address]    Report Status PENDING  Incomplete     Time coordinating discharge: Over 40 minutes  SIGNED:   Marinda Elk, MD  [facility] 04/01/2023, 8:46 AM Pager   If 7PM-7AM, please contact night-coverage [email or web address] Password [email or web address]

## 2023-04-01 NOTE — Procedures (Signed)
Patient seen on Hemodialysis. BP (!) 154/77   Pulse 75   Temp 97.8 F (36.6 C)   Resp 10   Ht 6' (1.829 m)   Wt 75.1 kg   SpO2 98%   BMI 22.45 kg/m   QB 400, UF goal 3L Tolerating treatment without complaints at this time.   Zetta Bills MD [facility]. Office # [phone] Pager # [phone] 4:18 PM

## 2023-04-01 NOTE — Progress Notes (Signed)
Patient off floor to dialysis

## 2023-04-02 LAB — CULTURE, BLOOD (ROUTINE X 2)
Culture: NO GROWTH
Culture: NO GROWTH

## 2023-04-04 NOTE — Progress Notes (Signed)
Late Note Entry- Nov. 11, 2024  Pt was d/c on Friday evening. Contacted [facility] [employer] this am and staff confirm pt at treatment. Clinic received clinicals on Friday as well.   Olivia Canter Renal Navigator [phone]

## 2023-04-07 ENCOUNTER — Ambulatory Visit: Payer: Medicare Other | Admitting: Internal Medicine

## 2023-04-07 ENCOUNTER — Encounter: Payer: Self-pay | Admitting: Internal Medicine

## 2023-07-14 ENCOUNTER — Emergency Department (HOSPITAL_COMMUNITY): Payer: Medicare Other

## 2023-07-14 ENCOUNTER — Inpatient Hospital Stay (HOSPITAL_COMMUNITY)
Admission: EM | Admit: 2023-07-14 | Discharge: 2023-07-21 | DRG: 280 | Disposition: A | Discharge status: Home / Self Care | Payer: Medicare Other | Attending: Internal Medicine | Admitting: Internal Medicine

## 2023-07-14 ENCOUNTER — Encounter (HOSPITAL_COMMUNITY): Payer: Self-pay | Admitting: *Deleted

## 2023-07-14 ENCOUNTER — Other Ambulatory Visit: Payer: Self-pay

## 2023-07-14 DIAGNOSIS — I10 Essential (primary) hypertension: Secondary | ICD-10-CM | POA: Diagnosis not present

## 2023-07-14 DIAGNOSIS — Z8249 Family history of ischemic heart disease and other diseases of the circulatory system: Secondary | ICD-10-CM

## 2023-07-14 DIAGNOSIS — E1165 Type 2 diabetes mellitus with hyperglycemia: Secondary | ICD-10-CM | POA: Diagnosis present

## 2023-07-14 DIAGNOSIS — Z603 Acculturation difficulty: Secondary | ICD-10-CM | POA: Diagnosis present

## 2023-07-14 DIAGNOSIS — N186 End stage renal disease: Secondary | ICD-10-CM

## 2023-07-14 DIAGNOSIS — I21A1 Myocardial infarction type 2: Secondary | ICD-10-CM | POA: Diagnosis present

## 2023-07-14 DIAGNOSIS — Z79899 Other long term (current) drug therapy: Secondary | ICD-10-CM | POA: Diagnosis not present

## 2023-07-14 DIAGNOSIS — R7989 Other specified abnormal findings of blood chemistry: Secondary | ICD-10-CM | POA: Diagnosis present

## 2023-07-14 DIAGNOSIS — I5043 Acute on chronic combined systolic (congestive) and diastolic (congestive) heart failure: Secondary | ICD-10-CM | POA: Diagnosis present

## 2023-07-14 DIAGNOSIS — E46 Unspecified protein-calorie malnutrition: Secondary | ICD-10-CM | POA: Diagnosis not present

## 2023-07-14 DIAGNOSIS — Z6822 Body mass index (BMI) 22.0-22.9, adult: Secondary | ICD-10-CM

## 2023-07-14 DIAGNOSIS — M898X9 Other specified disorders of bone, unspecified site: Secondary | ICD-10-CM | POA: Diagnosis present

## 2023-07-14 DIAGNOSIS — E1122 Type 2 diabetes mellitus with diabetic chronic kidney disease: Secondary | ICD-10-CM | POA: Diagnosis present

## 2023-07-14 DIAGNOSIS — J189 Pneumonia, unspecified organism: Secondary | ICD-10-CM | POA: Diagnosis not present

## 2023-07-14 DIAGNOSIS — I132 Hypertensive heart and chronic kidney disease with heart failure and with stage 5 chronic kidney disease, or end stage renal disease: Secondary | ICD-10-CM | POA: Diagnosis present

## 2023-07-14 DIAGNOSIS — J1001 Influenza due to other identified influenza virus with the same other identified influenza virus pneumonia: Secondary | ICD-10-CM | POA: Diagnosis present

## 2023-07-14 DIAGNOSIS — J101 Influenza due to other identified influenza virus with other respiratory manifestations: Secondary | ICD-10-CM | POA: Diagnosis present

## 2023-07-14 DIAGNOSIS — J9601 Acute respiratory failure with hypoxia: Secondary | ICD-10-CM | POA: Diagnosis present

## 2023-07-14 DIAGNOSIS — Z8673 Personal history of transient ischemic attack (TIA), and cerebral infarction without residual deficits: Secondary | ICD-10-CM | POA: Diagnosis not present

## 2023-07-14 DIAGNOSIS — Z1152 Encounter for screening for COVID-19: Secondary | ICD-10-CM

## 2023-07-14 DIAGNOSIS — Z87891 Personal history of nicotine dependence: Secondary | ICD-10-CM | POA: Diagnosis not present

## 2023-07-14 DIAGNOSIS — E785 Hyperlipidemia, unspecified: Secondary | ICD-10-CM | POA: Diagnosis present

## 2023-07-14 DIAGNOSIS — Z992 Dependence on renal dialysis: Secondary | ICD-10-CM

## 2023-07-14 DIAGNOSIS — D631 Anemia in chronic kidney disease: Secondary | ICD-10-CM | POA: Diagnosis present

## 2023-07-14 DIAGNOSIS — I161 Hypertensive emergency: Principal | ICD-10-CM | POA: Diagnosis present

## 2023-07-14 DIAGNOSIS — E8809 Other disorders of plasma-protein metabolism, not elsewhere classified: Secondary | ICD-10-CM | POA: Diagnosis present

## 2023-07-14 DIAGNOSIS — Z9981 Dependence on supplemental oxygen: Secondary | ICD-10-CM

## 2023-07-14 DIAGNOSIS — E441 Mild protein-calorie malnutrition: Secondary | ICD-10-CM | POA: Diagnosis present

## 2023-07-14 DIAGNOSIS — Z833 Family history of diabetes mellitus: Secondary | ICD-10-CM | POA: Diagnosis not present

## 2023-07-14 DIAGNOSIS — Z91148 Patient's other noncompliance with medication regimen for other reason: Secondary | ICD-10-CM

## 2023-07-14 DIAGNOSIS — N250 Renal osteodystrophy: Secondary | ICD-10-CM | POA: Diagnosis present

## 2023-07-14 DIAGNOSIS — I5020 Unspecified systolic (congestive) heart failure: Principal | ICD-10-CM

## 2023-07-14 DIAGNOSIS — I5021 Acute systolic (congestive) heart failure: Secondary | ICD-10-CM | POA: Diagnosis not present

## 2023-07-14 LAB — CBC WITH DIFFERENTIAL/PLATELET
Abs Immature Granulocytes: 0.08 10*3/uL — ABNORMAL HIGH (ref 0.00–0.07)
Basophils Absolute: 0.1 10*3/uL (ref 0.0–0.1)
Basophils Relative: 1 %
Eosinophils Absolute: 0.1 10*3/uL (ref 0.0–0.5)
Eosinophils Relative: 0 %
HCT: 30.3 % — ABNORMAL LOW (ref 39.0–52.0)
Hemoglobin: 10 g/dL — ABNORMAL LOW (ref 13.0–17.0)
Immature Granulocytes: 1 %
Lymphocytes Relative: 1 %
Lymphs Abs: 0.1 10*3/uL — ABNORMAL LOW (ref 0.7–4.0)
MCH: 31.9 pg (ref 26.0–34.0)
MCHC: 33 g/dL (ref 30.0–36.0)
MCV: 96.8 fL (ref 80.0–100.0)
Monocytes Absolute: 0.8 10*3/uL (ref 0.1–1.0)
Monocytes Relative: 6 %
Neutro Abs: 12.3 10*3/uL — ABNORMAL HIGH (ref 1.7–7.7)
Neutrophils Relative %: 91 %
Platelets: 227 10*3/uL (ref 150–400)
RBC: 3.13 MIL/uL — ABNORMAL LOW (ref 4.22–5.81)
RDW: 14.6 % (ref 11.5–15.5)
WBC: 13.4 10*3/uL — ABNORMAL HIGH (ref 4.0–10.5)
nRBC: 0 % (ref 0.0–0.2)

## 2023-07-14 LAB — I-STAT CHEM 8, ED
BUN: 46 mg/dL — ABNORMAL HIGH (ref 8–23)
Calcium, Ion: 1.03 mmol/L — ABNORMAL LOW (ref 1.15–1.40)
Chloride: 100 mmol/L (ref 98–111)
Creatinine, Ser: 8.6 mg/dL — ABNORMAL HIGH (ref 0.61–1.24)
Glucose, Bld: 195 mg/dL — ABNORMAL HIGH (ref 70–99)
HCT: 35 % — ABNORMAL LOW (ref 39.0–52.0)
Hemoglobin: 11.9 g/dL — ABNORMAL LOW (ref 13.0–17.0)
Potassium: 4.1 mmol/L (ref 3.5–5.1)
Sodium: 136 mmol/L (ref 135–145)
TCO2: 21 mmol/L — ABNORMAL LOW (ref 22–32)

## 2023-07-14 LAB — COMPREHENSIVE METABOLIC PANEL
ALT: 13 U/L (ref 0–44)
AST: 21 U/L (ref 15–41)
Albumin: 3.2 g/dL — ABNORMAL LOW (ref 3.5–5.0)
Alkaline Phosphatase: 55 U/L (ref 38–126)
Anion gap: 15 (ref 5–15)
BUN: 53 mg/dL — ABNORMAL HIGH (ref 8–23)
CO2: 19 mmol/L — ABNORMAL LOW (ref 22–32)
Calcium: 7.7 mg/dL — ABNORMAL LOW (ref 8.9–10.3)
Chloride: 99 mmol/L (ref 98–111)
Creatinine, Ser: 8.14 mg/dL — ABNORMAL HIGH (ref 0.61–1.24)
GFR, Estimated: 7 mL/min — ABNORMAL LOW (ref >60)
Glucose, Bld: 174 mg/dL — ABNORMAL HIGH (ref 70–99)
Potassium: 4.2 mmol/L (ref 3.5–5.1)
Sodium: 133 mmol/L — ABNORMAL LOW (ref 135–145)
Total Bilirubin: 1.1 mg/dL (ref 0.0–1.2)
Total Protein: 7.6 g/dL (ref 6.5–8.1)

## 2023-07-14 LAB — BRAIN NATRIURETIC PEPTIDE: B Natriuretic Peptide: 3242 pg/mL — ABNORMAL HIGH (ref 0.0–100.0)

## 2023-07-14 LAB — TROPONIN I (HIGH SENSITIVITY)
Troponin I (High Sensitivity): 225 ng/L (ref <18)
Troponin I (High Sensitivity): 430 ng/L (ref <18)

## 2023-07-14 LAB — CBG MONITORING, ED: Glucose-Capillary: 165 mg/dL — ABNORMAL HIGH (ref 70–99)

## 2023-07-14 MED ORDER — LABETALOL HCL 200 MG PO TABS
200.0000 mg | ORAL_TABLET | Freq: Once | ORAL | Status: DC
Start: 2023-07-14 — End: 2023-07-14

## 2023-07-14 MED ORDER — HYDRALAZINE HCL 20 MG/ML IJ SOLN
5.0000 mg | Freq: Once | INTRAMUSCULAR | Status: AC
  Administered 2023-07-14: 5 mg via INTRAVENOUS
  Filled 2023-07-14: qty 1

## 2023-07-14 MED ORDER — CHLORHEXIDINE GLUCONATE CLOTH 2 % EX PADS
6.0000 | MEDICATED_PAD | Freq: Every day | CUTANEOUS | Status: DC
Start: 2023-07-15 — End: 2023-07-21
  Administered 2023-07-18 – 2023-07-21 (×4): 6 via TOPICAL

## 2023-07-14 MED ORDER — FUROSEMIDE 10 MG/ML IJ SOLN
80.0000 mg | Freq: Once | INTRAMUSCULAR | Status: AC
  Administered 2023-07-14: 80 mg via INTRAVENOUS
  Filled 2023-07-14: qty 8

## 2023-07-14 MED ORDER — LABETALOL HCL 5 MG/ML IV SOLN
20.0000 mg | Freq: Once | INTRAVENOUS | Status: AC
  Administered 2023-07-14: 20 mg via INTRAVENOUS
  Filled 2023-07-14: qty 4

## 2023-07-14 NOTE — ED Triage Notes (Signed)
 Pt called and BIB [facility] for breathing difficultly, reported started an hour ago.  Pt with hx CNF and hemodialysis - last treatment yesterday. Fire dept arrived at scene and pt sats 61 % on his [place], unk how many liters, sats increased to 73% on NRB and 91 % on CPAP per report.  + productive cough, clear in color.

## 2023-07-14 NOTE — H&P (Incomplete)
 History and Physical    Patient: Matthew Robles XBJ:[id number] DOB: 1955-10-06 DOA: 07/14/2023 DOS: the patient was seen and examined on 07/15/2023 PCP: [facility]  Patient coming from: Home  Chief Complaint:  Chief Complaint  Patient presents with   Shortness of Breath   HPI: Matthew Robles is a 68 y.o. male with medical history significant of hypertension, end-stage renal disease on hemodialysis (MWF), type 2 diabetes mellitus, history of stroke who presents to the emergency department from home via EMS due to increased work of breathing which started about 1 hour prior to arrival to the ED.  EMS was activated and on arrival of EMS team, O2 sat was noted to be 61% on home oxygen (patient states that he uses supplemental oxygen only as needed at home).  He was placed on NRB with O2 sat increasing to 73%.  O2 sat increased to 91% on CPAP per report.  ED Course:  In the emergency department, respiratory rate was 27/min, pulse 120 bpm, BP was 200/102.  O2 sat 100% on BiPAP.  Workup in the ED showed WBC 10.4, hemoglobin 10.0, hematocrit 30.3, MCV 96.8, platelets 227.  BMP showed sodium 133, potassium 4.2, chloride 99, bicarb 19, blood glucose 174, BUN 53, creatinine 8.14, calcium 7.7, albumin 3.2.  Troponin 225 > 430.  BNP 3,242 (this was 2781 on 03/27/2023) and it was > 4,500 on 02/15/2023). Chest x-ray showed findings favoring congestive heart failure/pulmonary edema. There is superimposed heterogeneous opacity overlying the right lower lung zone partially obscuring the right lateral hemidiaphragm, concerning for pneumonia. IV Lasix 80 mg x 1 was given, IV labetalol and hydralazine due to elevated BP with given as well.  Nephrologist was consulted and recommended admitting patient with plan to dialyze patient in the morning per EDP.  Hospitalist was asked to admit patient for further evaluation and management.  Review of Systems: Review of systems as noted in the HPI.  All other systems reviewed and are negative.   Past Medical History:  Diagnosis Date   CHF (congestive heart failure) (HCC)    Chronic kidney disease    Diabetes mellitus without complication (HCC)    Hypertension    Stroke (HCC)    2017   Past Surgical History:  Procedure Laterality Date   AV FISTULA PLACEMENT Left 07/13/2022   Procedure: LEFT ARM ARTERIOVENOUS (AV) FISTULA CREATION;  Surgeon: [name], MD;  Location: [facility];  Service: Vascular;  Laterality: Left;   BASCILIC VEIN TRANSPOSITION Left 08/31/2022   Procedure: LEFT ARM SECOND STAGE BASILIC VEIN TRANSPOSITION;  Surgeon: [name], MD;  Location: [facility];  Service: Vascular;  Laterality: Left;   INSERTION OF DIALYSIS CATHETER Right 06/25/2022   Procedure: INSERTION OF DIALYSIS CATHETER;  Surgeon: Lucretia Roers, MD;  Location: [facility];  Service: General;  Laterality: Right;   WISDOM TOOTH EXTRACTION      Social History:  reports that he quit smoking about 5 years ago. His smoking use included cigarettes. He has never used smokeless tobacco. He reports that he does not currently use alcohol after a past usage of about 4.0 standard drinks of alcohol per week. He reports that he does not currently use drugs after having used the following drugs: Marijuana.   No Known Allergies  Family History  Problem Relation Age of Onset   Diabetes Mother    Hypertension Mother    Diabetes Maternal Grandfather      Prior to Admission medications   Medication Sig Start  Date End Date Taking? Authorizing Provider  amLODipine (NORVASC) 10 MG tablet Take 1 tablet (10 mg total) by mouth daily. 04/01/23   Marinda Elk, MD  calcitRIOL (ROCALTROL) 0.5 MCG capsule Take 1 capsule (0.5 mcg total) by mouth daily. Patient not taking: Reported on 03/27/2023 07/05/22   Catarina Hartshorn, MD  calcium carbonate (TUMS - DOSED IN MG ELEMENTAL CALCIUM) 500 MG chewable tablet Chew 2 tablets by mouth every dialysis for indigestion or heartburn.     [provider]  FEROSUL 325 (65 Fe) MG tablet Take 325 mg by mouth daily with breakfast. 08/18/22   [provider]  furosemide (LASIX) 80 MG tablet Take 1 tablet (80 mg total) by mouth daily. 04/01/23   Marinda Elk, MD  hydrALAZINE (APRESOLINE) 100 MG tablet Take 1 tablet (100 mg total) by mouth 3 (three) times daily. 04/01/23   Marinda Elk, MD  labetalol (NORMODYNE) 200 MG tablet Take 2 tablets (400 mg total) by mouth 2 (two) times daily. 04/01/23   Marinda Elk, MD  lidocaine-prilocaine (EMLA) cream Apply 1 Application topically. 09/21/22   [provider]  magnesium gluconate (MAGONATE) 500 MG tablet Take 500 mg by mouth 2 (two) times daily.    [provider]  vitamin B-12 (CYANOCOBALAMIN) 100 MCG tablet Take 100 mcg by mouth daily.    [provider]    Physical Exam: BP (!) 176/93   Pulse 87   Resp 19   Ht 6' (1.829 m)   Wt 72.1 kg   SpO2 93%   BMI 21.56 kg/m   General: 68 y.o. year-old male ill appearing and on BiPAP.  Alert and oriented x3. HEENT: NCAT, EOMI Neck: Supple, trachea medial Cardiovascular: Regular rate and rhythm with no rubs or gallops.  No thyromegaly or JVD noted.  No lower extremity edema. 2/4 pulses in all 4 extremities. Respiratory: Bilateral Rales in lower lobes on auscultation.   Abdomen: Soft, nontender nondistended with normal bowel sounds x4 quadrants. Muskuloskeletal: No cyanosis, clubbing or edema noted bilaterally Neuro: CN II-XII intact, sensation, reflexes intact Skin: No ulcerative lesions noted or rashes Psychiatry: Judgement and insight appear normal. Mood is appropriate for condition and setting          Labs on Admission:  Basic Metabolic Panel: Recent Labs  Lab 07/14/23 1910 07/14/23 2042 07/15/23 0119  NA 136 133* 136  137  K 4.1 4.2 4.1  4.2  CL 100 99 100  99  CO2  --  19* 22  22  GLUCOSE 195* 174* 126*  127*  BUN 46* 53* 59*  58*  CREATININE 8.60*  8.14* 8.93*  8.96*  CALCIUM  --  7.7* 7.8*  7.9*  MG  --   --  2.1  PHOS  --   --  4.6  4.5   Liver Function Tests: Recent Labs  Lab 07/14/23 2042 07/15/23 0119  AST 21 17  ALT 13 12  ALKPHOS 55 50  BILITOT 1.1 1.1  PROT 7.6 7.0  ALBUMIN 3.2* 2.9*  2.9*   No results for input(s): "LIPASE", "AMYLASE" in the last 168 hours. No results for input(s): "AMMONIA" in the last 168 hours. CBC: Recent Labs  Lab 07/14/23 1910 07/14/23 2042 07/15/23 0119  WBC  --  13.4* 10.0  NEUTROABS  --  12.3*  --   HGB 11.9* 10.0* 9.2*  HCT 35.0* 30.3* 28.1*  MCV  --  96.8 96.2  PLT  --  227 196   Cardiac  Enzymes: No results for input(s): "CKTOTAL", "CKMB", "CKMBINDEX", "TROPONINI" in the last 168 hours.  BNP (last 3 results) Recent Labs    02/15/23 1935 03/27/23 1845 07/14/23 2042  BNP >4,500.0* 2,781.0* 3,242.0*    ProBNP (last 3 results) No results for input(s): "PROBNP" in the last 8760 hours.  CBG: Recent Labs  Lab 07/14/23 2123  GLUCAP 165*    Radiological Exams on Admission: DG Chest Port 1 View Result Date: 07/14/2023 CLINICAL DATA:  Shortness of breath.  History of CHF. EXAM: PORTABLE CHEST 1 VIEW COMPARISON:  03/27/2023. FINDINGS: Redemonstration of moderate increased interstitial markings throughout bilateral lungs with bilateral hilar and lower lung zone predominance. There is superimposed heterogeneous opacity overlying the right lower lung zone partially obscuring the right lateral hemidiaphragm, concerning for pneumonia. Follow-up to clearing is recommended. Bilateral lung fields are otherwise clear. Left lateral costophrenic angle is clear. Stable cardio-mediastinal silhouette. No acute osseous abnormalities. The soft tissues are within normal limits. IMPRESSION: *Findings favor congestive heart failure/pulmonary edema. *There is superimposed heterogeneous opacity overlying the right lower lung zone partially obscuring the right lateral hemidiaphragm, concerning for  pneumonia. Follow-up to clearing is recommended. Electronically Signed   By: Jules Schick M.D.   On: 07/14/2023 19:26    EKG: I independently viewed the EKG done and my findings are as followed: Sinus tachycardia at rate of 105 bpm with repolarization abnormality (similar to EKG on 04/06/2023)  Assessment/Plan Present on Admission:  Acute on chronic combined systolic and diastolic CHF (congestive heart failure) (HCC)  Acute respiratory failure with hypoxia (HCC)  Hypoalbuminemia due to protein-calorie malnutrition (HCC)  Essential hypertension  Elevated troponin  Principal Problem:   Acute on chronic combined systolic and diastolic CHF (congestive heart failure) (HCC) Active Problems:   ESRD on hemodialysis (HCC)   Acute respiratory failure with hypoxia (HCC)   Hypoalbuminemia due to protein-calorie malnutrition (HCC)   Elevated troponin   Essential hypertension   CAP (community acquired pneumonia)   Hypocalcemia   Type 2 diabetes mellitus with hyperglycemia (HCC)  Acute on chronic combined systolic and diastolic CHF Chest x-ray was suggestive of pulmonary edema BNP was 3,242 (this was 2781 on 03/27/2023) Continue total input/output, daily weights and fluid restriction IV Lasix 80 mg x 1 was given Continue heart healthy diet  Echocardiogram done in November 2024 showed LVEF of 45 to 50%.  LV demonstrates global hypokinesis.  Moderate LVH.  G3 DD.  Echocardiogram will be done in the morning. This is possibly due to patient's fluid overload.  Continue BiPAP at this time with plan to wean patient off this as tolerated as some level based on Neurologist was consulted for dialysis in the morning  Acute respiratory failure with hypoxia due to pulmonary edema Continue BiPAP to maintain O2 sat greater than 92% with plan to transition to supplemental oxygen via [place].  Elevated troponin possibly due to type II demand ischemia, rule out NSTEMI Troponin 225 > 430 > 907 Patient was started  on heparin drip Patient denies chest pain Repeated EKG personally reviewed showed normal sinus rhythm at a rate of 89 bpm with LVH with secondary repolarization abnormality Cardiology fellow (Dr. Geraldo Pitter) was consulted and agreed with heparin drip for now with plan to consult with cardiologist in the morning for further recommendations.  ESRD on HD (MWF) Nephrology was consulted for dialysis in the morning per EDP  Presumed CAP POA Chest x-ray was suggestive of pneumonia Patient was started on ceftriaxone and azithromycin, we shall continue same at this  time with plan to de-escalate/discontinue based on blood culture, sputum culture, urine Legionella, strep pneumo and procalcitonin Continue Tylenol as needed Continue Mucinex, incentive spirometry, flutter valve   Hypocalcemia Calcium 7.7 corrected calcium level based on albumin is 8.3 Continue Os-Cal  Hypoalbuminemia possible secondary to mild protein calorie malnutrition Albumin 3.2, protein supplement will be provided  Essential hypertension Continue amlodipine, hydralazine Continue IV hydralazine 10 mg every 6 hours as needed for SBP > 170  Type 2 diabetes mellitus with hyperglycemia Hemoglobin A1c at 5.8 Continue ISS and hypoglycemia protocol   DVT prophylaxis: Heparin drip  Code Status: Full code  Family Communication: None at bedside  Consults: Cardiology  Severity of Illness: The appropriate patient status for this patient is INPATIENT. Inpatient status is judged to be reasonable and necessary in order to provide the required intensity of service to ensure the patient's safety. The patient's presenting symptoms, physical exam findings, and initial radiographic and laboratory data in the context of their chronic comorbidities is felt to place them at high risk for further clinical deterioration. Furthermore, it is not anticipated that the patient will be medically stable for discharge from the hospital within 2 midnights  of admission.   * I certify that at the point of admission it is my clinical judgment that the patient will require inpatient hospital care spanning beyond 2 midnights from the point of admission due to high intensity of service, high risk for further deterioration and high frequency of surveillance required.*  Author: Frankey Shown, DO 07/15/2023 3:36 AM  For on call review [email or web address].

## 2023-07-14 NOTE — Progress Notes (Signed)
 Attempted trial off BIPAP on 6 lpm nasal cannula. Patient became very short of breath, desatted into 80s and RR was in the 30s. Patient was requesting to go back on machine. Patient placed back on BIPAP, O2 was titrated to 75% from 100%.

## 2023-07-14 NOTE — ED Provider Notes (Signed)
 [facility] AT [facility] Provider Note   CSN: [id number] Arrival date & time: 07/14/23  4098     History {Add pertinent medical, surgical, social history, OB history to HPI:1} Chief Complaint  Patient presents with   Shortness of Breath    Matthew Robles is a 68 y.o. male.  Patient has a history of heart failure and renal failure.  He had dialysis yesterday.  Patient states he became really short of breath.  When the paramedics arrived his O2 sat were in the 70s and he was placed on BiPAP   Shortness of Breath      Home Medications Prior to Admission medications   Medication Sig Start Date End Date Taking? Authorizing Provider  amLODipine (NORVASC) 10 MG tablet Take 1 tablet (10 mg total) by mouth daily. 04/01/23   Marinda Elk, MD  calcitRIOL (ROCALTROL) 0.5 MCG capsule Take 1 capsule (0.5 mcg total) by mouth daily. Patient not taking: Reported on 03/27/2023 07/05/22   Catarina Hartshorn, MD  calcium carbonate (TUMS - DOSED IN MG ELEMENTAL CALCIUM) 500 MG chewable tablet Chew 2 tablets by mouth every dialysis for indigestion or heartburn.    [provider]  FEROSUL 325 (65 Fe) MG tablet Take 325 mg by mouth daily with breakfast. 08/18/22   [provider]  furosemide (LASIX) 80 MG tablet Take 1 tablet (80 mg total) by mouth daily. 04/01/23   Marinda Elk, MD  hydrALAZINE (APRESOLINE) 100 MG tablet Take 1 tablet (100 mg total) by mouth 3 (three) times daily. 04/01/23   Marinda Elk, MD  labetalol (NORMODYNE) 200 MG tablet Take 2 tablets (400 mg total) by mouth 2 (two) times daily. 04/01/23   Marinda Elk, MD  lidocaine-prilocaine (EMLA) cream Apply 1 Application topically. 09/21/22   [provider]  magnesium gluconate (MAGONATE) 500 MG tablet Take 500 mg by mouth 2 (two) times daily.    [provider]  vitamin B-12 (CYANOCOBALAMIN) 100 MCG tablet Take 100 mcg by mouth daily.    [provider]      Allergies    Patient has no known allergies.    Review of Systems   Review of Systems  Respiratory:  Positive for shortness of breath.     Physical Exam Updated Vital Signs BP (!) 190/101   Pulse (!) 103   Resp 19   Ht 6' (1.829 m)   Wt 72.1 kg   SpO2 93%   BMI 21.56 kg/m  Physical Exam  ED Results / Procedures / Treatments   Labs (all labs ordered are listed, but only abnormal results are displayed) Labs Reviewed  CBC WITH DIFFERENTIAL/PLATELET - Abnormal; Notable for the following components:      Result Value   WBC 13.4 (*)    RBC 3.13 (*)    Hemoglobin 10.0 (*)    HCT 30.3 (*)    Neutro Abs 12.3 (*)    Lymphs Abs 0.1 (*)    Abs Immature Granulocytes 0.08 (*)    All other components within normal limits  COMPREHENSIVE METABOLIC PANEL - Abnormal; Notable for the following components:   Sodium 133 (*)    CO2 19 (*)    Glucose, Bld 174 (*)    BUN 53 (*)    Creatinine, Ser 8.14 (*)    Calcium 7.7 (*)    Albumin 3.2 (*)    GFR, Estimated 7 (*)    All other components within normal limits  BRAIN  NATRIURETIC PEPTIDE - Abnormal; Notable for the following components:   B Natriuretic Peptide 3,242.0 (*)    All other components within normal limits  I-STAT CHEM 8, ED - Abnormal; Notable for the following components:   BUN 46 (*)    Creatinine, Ser 8.60 (*)    Glucose, Bld 195 (*)    Calcium, Ion 1.03 (*)    TCO2 21 (*)    Hemoglobin 11.9 (*)    HCT 35.0 (*)    All other components within normal limits  CBG MONITORING, ED - Abnormal; Notable for the following components:   Glucose-Capillary 165 (*)    All other components within normal limits  TROPONIN I (HIGH SENSITIVITY) - Abnormal; Notable for the following components:   Troponin I (High Sensitivity) 225 (*)    All other components within normal limits  HEPATITIS B SURFACE ANTIGEN  HEPATITIS B SURFACE ANTIBODY, QUANTITATIVE  RENAL FUNCTION PANEL  TROPONIN I (HIGH SENSITIVITY)     EKG None  Radiology DG Chest Port 1 View Result Date: 07/14/2023 CLINICAL DATA:  Shortness of breath.  History of CHF. EXAM: PORTABLE CHEST 1 VIEW COMPARISON:  03/27/2023. FINDINGS: Redemonstration of moderate increased interstitial markings throughout bilateral lungs with bilateral hilar and lower lung zone predominance. There is superimposed heterogeneous opacity overlying the right lower lung zone partially obscuring the right lateral hemidiaphragm, concerning for pneumonia. Follow-up to clearing is recommended. Bilateral lung fields are otherwise clear. Left lateral costophrenic angle is clear. Stable cardio-mediastinal silhouette. No acute osseous abnormalities. The soft tissues are within normal limits. IMPRESSION: *Findings favor congestive heart failure/pulmonary edema. *There is superimposed heterogeneous opacity overlying the right lower lung zone partially obscuring the right lateral hemidiaphragm, concerning for pneumonia. Follow-up to clearing is recommended. Electronically Signed   By: Jules Schick M.D.   On: 07/14/2023 19:26    Procedures Procedures  {Document cardiac monitor, telemetry assessment procedure when appropriate:1}  Medications Ordered in ED Medications  Chlorhexidine Gluconate Cloth 2 % PADS 6 each (has no administration in time range)  furosemide (LASIX) injection 80 mg (80 mg Intravenous Given 07/14/23 1944)  hydrALAZINE (APRESOLINE) injection 5 mg (5 mg Intravenous Given 07/14/23 2042)  labetalol (NORMODYNE) injection 20 mg (20 mg Intravenous Given 07/14/23 2047)    ED Course/ Medical Decision Making/ A&P   {Patient and congestive heart failure but is stable on BiPAP.  I spoke with Dr. Malen Gauze nephrology and she felt like the patient could be admitted to the hospitalist service here tonight and get dialysis in the morning.    CRITICAL CARE Performed by: Bethann Berkshire Total critical care time: 50 minutes Critical care time was exclusive of separately  billable procedures and treating other patients. Critical care was necessary to treat or prevent imminent or life-threatening deterioration. Critical care was time spent personally by me on the following activities: development of treatment plan with patient and/or surrogate as well as nursing, discussions with consultants, evaluation of patient's response to treatment, examination of patient, obtaining history from patient or surrogate, ordering and performing treatments and interventions, ordering and review of laboratory studies, ordering and review of radiographic studies, pulse oximetry and re-evaluation of patient's condition.  Click here for ABCD2, HEART and other calculatorsREFRESH Note before signing :1}                              Medical Decision Making Amount and/or Complexity of Data Reviewed Labs: ordered. Radiology: ordered. ECG/medicine tests: ordered.  Risk Prescription drug management. Decision regarding hospitalization.   Congestive heart failure and hypoxia with renal failure  {Document critical care time when appropriate:1} {Document review of labs and clinical decision tools ie heart score, Chads2Vasc2 etc:1}  {Document your independent review of radiology images, and any outside records:1} {Document your discussion with family members, caretakers, and with consultants:1} {Document social determinants of health affecting pt's care:1} {Document your decision making why or why not admission, treatments were needed:1} Final Clinical Impression(s) / ED Diagnoses Final diagnoses:  Systolic congestive heart failure, unspecified HF chronicity (HCC)    Rx / DC Orders ED Discharge Orders     None

## 2023-07-14 NOTE — ED Notes (Signed)
 This RN tried the pt off the BiPap per MD Zammit request and the pts [place] O2 at 4L like he was on at home and the pt endorsed having an harder time breathing and I noted the pts RR increased to 33 and SpO2 decreased to 88 %. This RN increased the pts O2 to 6L and the pt was endorsing a hard time breathing, RR 33 and SpO2 at 88%.  This RN placed the pt back on the BiPap and informed Respiratoy and MD Zammit of the situation.

## 2023-07-15 ENCOUNTER — Other Ambulatory Visit (HOSPITAL_COMMUNITY): Payer: Self-pay | Admitting: *Deleted

## 2023-07-15 ENCOUNTER — Other Ambulatory Visit: Payer: Self-pay

## 2023-07-15 ENCOUNTER — Inpatient Hospital Stay (HOSPITAL_COMMUNITY): Payer: Medicare Other

## 2023-07-15 DIAGNOSIS — J189 Pneumonia, unspecified organism: Secondary | ICD-10-CM | POA: Insufficient documentation

## 2023-07-15 DIAGNOSIS — I161 Hypertensive emergency: Secondary | ICD-10-CM | POA: Diagnosis not present

## 2023-07-15 DIAGNOSIS — I5021 Acute systolic (congestive) heart failure: Secondary | ICD-10-CM | POA: Diagnosis not present

## 2023-07-15 DIAGNOSIS — I5043 Acute on chronic combined systolic (congestive) and diastolic (congestive) heart failure: Secondary | ICD-10-CM | POA: Diagnosis not present

## 2023-07-15 DIAGNOSIS — E1165 Type 2 diabetes mellitus with hyperglycemia: Secondary | ICD-10-CM | POA: Insufficient documentation

## 2023-07-15 LAB — RENAL FUNCTION PANEL
Albumin: 2.9 g/dL — ABNORMAL LOW (ref 3.5–5.0)
Anion gap: 16 — ABNORMAL HIGH (ref 5–15)
BUN: 58 mg/dL — ABNORMAL HIGH (ref 8–23)
CO2: 22 mmol/L (ref 22–32)
Calcium: 7.9 mg/dL — ABNORMAL LOW (ref 8.9–10.3)
Chloride: 99 mmol/L (ref 98–111)
Creatinine, Ser: 8.96 mg/dL — ABNORMAL HIGH (ref 0.61–1.24)
GFR, Estimated: 6 mL/min — ABNORMAL LOW (ref >60)
Glucose, Bld: 127 mg/dL — ABNORMAL HIGH (ref 70–99)
Phosphorus: 4.5 mg/dL (ref 2.5–4.6)
Potassium: 4.2 mmol/L (ref 3.5–5.1)
Sodium: 137 mmol/L (ref 135–145)

## 2023-07-15 LAB — COMPREHENSIVE METABOLIC PANEL
ALT: 12 U/L (ref 0–44)
AST: 17 U/L (ref 15–41)
Albumin: 2.9 g/dL — ABNORMAL LOW (ref 3.5–5.0)
Alkaline Phosphatase: 50 U/L (ref 38–126)
Anion gap: 14 (ref 5–15)
BUN: 59 mg/dL — ABNORMAL HIGH (ref 8–23)
CO2: 22 mmol/L (ref 22–32)
Calcium: 7.8 mg/dL — ABNORMAL LOW (ref 8.9–10.3)
Chloride: 100 mmol/L (ref 98–111)
Creatinine, Ser: 8.93 mg/dL — ABNORMAL HIGH (ref 0.61–1.24)
GFR, Estimated: 6 mL/min — ABNORMAL LOW (ref >60)
Glucose, Bld: 126 mg/dL — ABNORMAL HIGH (ref 70–99)
Potassium: 4.1 mmol/L (ref 3.5–5.1)
Sodium: 136 mmol/L (ref 135–145)
Total Bilirubin: 1.1 mg/dL (ref 0.0–1.2)
Total Protein: 7 g/dL (ref 6.5–8.1)

## 2023-07-15 LAB — GLUCOSE, CAPILLARY: Glucose-Capillary: 100 mg/dL — ABNORMAL HIGH (ref 70–99)

## 2023-07-15 LAB — HEPARIN LEVEL (UNFRACTIONATED)
Heparin Unfractionated: 0.29 [IU]/mL — ABNORMAL LOW (ref 0.30–0.70)
Heparin Unfractionated: 0.7 [IU]/mL (ref 0.30–0.70)

## 2023-07-15 LAB — CBC
HCT: 28.1 % — ABNORMAL LOW (ref 39.0–52.0)
Hemoglobin: 9.2 g/dL — ABNORMAL LOW (ref 13.0–17.0)
MCH: 31.5 pg (ref 26.0–34.0)
MCHC: 32.7 g/dL (ref 30.0–36.0)
MCV: 96.2 fL (ref 80.0–100.0)
Platelets: 196 10*3/uL (ref 150–400)
RBC: 2.92 MIL/uL — ABNORMAL LOW (ref 4.22–5.81)
RDW: 14.6 % (ref 11.5–15.5)
WBC: 10 10*3/uL (ref 4.0–10.5)
nRBC: 0 % (ref 0.0–0.2)

## 2023-07-15 LAB — ECHOCARDIOGRAM COMPLETE
AR max vel: 2.73 cm2
AV Area VTI: 2.9 cm2
AV Area mean vel: 2.79 cm2
AV Mean grad: 3.5 mm[Hg]
AV Peak grad: 6.3 mm[Hg]
Ao pk vel: 1.25 m/s
Area-P 1/2: 5.54 cm2
Height: 72 in
S' Lateral: 3.5 cm
Weight: 2543.23 [oz_av]

## 2023-07-15 LAB — PROCALCITONIN: Procalcitonin: 15.82 ng/mL

## 2023-07-15 LAB — HEPATITIS B SURFACE ANTIGEN: Hepatitis B Surface Ag: NONREACTIVE

## 2023-07-15 LAB — RESP PANEL BY RT-PCR (RSV, FLU A&B, COVID)  RVPGX2
Influenza A by PCR: POSITIVE — AB
Influenza B by PCR: NEGATIVE
Resp Syncytial Virus by PCR: NEGATIVE
SARS Coronavirus 2 by RT PCR: NEGATIVE

## 2023-07-15 LAB — TROPONIN I (HIGH SENSITIVITY)
Troponin I (High Sensitivity): 907 ng/L (ref <18)
Troponin I (High Sensitivity): 1202 ng/L (ref <18)
Troponin I (High Sensitivity): 1456 ng/L (ref <18)

## 2023-07-15 LAB — PHOSPHORUS
Phosphorus: 4.6 mg/dL (ref 2.5–4.6)
Phosphorus: 5.1 mg/dL — ABNORMAL HIGH (ref 2.5–4.6)

## 2023-07-15 LAB — MAGNESIUM: Magnesium: 2.1 mg/dL (ref 1.7–2.4)

## 2023-07-15 LAB — HIV ANTIBODY (ROUTINE TESTING W REFLEX): HIV Screen 4th Generation wRfx: NONREACTIVE

## 2023-07-15 LAB — MRSA NEXT GEN BY PCR, NASAL: MRSA by PCR Next Gen: NOT DETECTED

## 2023-07-15 MED ORDER — OSELTAMIVIR PHOSPHATE 30 MG PO CAPS
30.0000 mg | ORAL_CAPSULE | ORAL | Status: AC
  Administered 2023-07-15 – 2023-07-20 (×3): 30 mg via ORAL
  Filled 2023-07-15 (×2): qty 1

## 2023-07-15 MED ORDER — GLUCERNA SHAKE PO LIQD
237.0000 mL | Freq: Three times a day (TID) | ORAL | Status: DC
  Administered 2023-07-16 – 2023-07-20 (×7): 237 mL via ORAL
  Filled 2023-07-15 (×8): qty 237

## 2023-07-15 MED ORDER — HYDRALAZINE HCL 20 MG/ML IJ SOLN: 10.0000 mg | Freq: Four times a day (QID) | INTRAMUSCULAR | Status: DC | PRN

## 2023-07-15 MED ORDER — ONDANSETRON HCL 4 MG PO TABS
4.0000 mg | ORAL_TABLET | Freq: Four times a day (QID) | ORAL | Status: DC | PRN
  Administered 2023-07-19: 4 mg via ORAL
  Filled 2023-07-15: qty 1

## 2023-07-15 MED ORDER — ACETAMINOPHEN 325 MG PO TABS
650.0000 mg | ORAL_TABLET | Freq: Four times a day (QID) | ORAL | Status: DC | PRN
  Administered 2023-07-16 – 2023-07-17 (×2): 650 mg via ORAL
  Filled 2023-07-15 (×2): qty 2

## 2023-07-15 MED ORDER — HYDRALAZINE HCL 25 MG PO TABS
100.0000 mg | ORAL_TABLET | Freq: Three times a day (TID) | ORAL | Status: DC
  Administered 2023-07-15 – 2023-07-17 (×6): 100 mg via ORAL
  Filled 2023-07-15 (×7): qty 4

## 2023-07-15 MED ORDER — ASPIRIN 81 MG PO TBEC
81.0000 mg | DELAYED_RELEASE_TABLET | Freq: Every day | ORAL | Status: DC
  Administered 2023-07-15 – 2023-07-17 (×3): 81 mg via ORAL
  Filled 2023-07-15 (×3): qty 1

## 2023-07-15 MED ORDER — SODIUM CHLORIDE 0.9 % IV SOLN
1.0000 g | INTRAVENOUS | Status: DC
  Administered 2023-07-15 – 2023-07-20 (×6): 1 g via INTRAVENOUS
  Filled 2023-07-15 (×6): qty 10

## 2023-07-15 MED ORDER — DM-GUAIFENESIN ER 30-600 MG PO TB12
1.0000 | ORAL_TABLET | Freq: Two times a day (BID) | ORAL | Status: DC
  Administered 2023-07-15 – 2023-07-21 (×14): 1 via ORAL
  Filled 2023-07-15 (×14): qty 1

## 2023-07-15 MED ORDER — CALCIUM CARBONATE 1250 (500 CA) MG PO TABS
1.0000 | ORAL_TABLET | Freq: Every day | ORAL | Status: DC
  Administered 2023-07-15 – 2023-07-21 (×6): 1250 mg via ORAL
  Filled 2023-07-15 (×6): qty 1

## 2023-07-15 MED ORDER — HEPARIN (PORCINE) 25000 UT/250ML-% IV SOLN
1050.0000 [IU]/h | INTRAVENOUS | Status: DC
  Administered 2023-07-15: 900 [IU]/h via INTRAVENOUS
  Administered 2023-07-16: 1000 [IU]/h via INTRAVENOUS
  Administered 2023-07-17: 1050 [IU]/h via INTRAVENOUS
  Filled 2023-07-15 (×3): qty 250

## 2023-07-15 MED ORDER — AMLODIPINE BESYLATE 5 MG PO TABS
10.0000 mg | ORAL_TABLET | Freq: Every day | ORAL | Status: DC
  Administered 2023-07-15 – 2023-07-21 (×7): 10 mg via ORAL
  Filled 2023-07-15 (×7): qty 2

## 2023-07-15 MED ORDER — HEPARIN BOLUS VIA INFUSION
4000.0000 [IU] | Freq: Once | INTRAVENOUS | Status: AC
  Administered 2023-07-15: 4000 [IU] via INTRAVENOUS

## 2023-07-15 MED ORDER — PENTAFLUOROPROP-TETRAFLUOROETH EX AERO: 1.0000 | INHALATION_SPRAY | CUTANEOUS | Status: DC | PRN

## 2023-07-15 MED ORDER — SODIUM CHLORIDE 0.9 % IV SOLN
500.0000 mg | INTRAVENOUS | Status: DC
  Administered 2023-07-15 – 2023-07-20 (×6): 500 mg via INTRAVENOUS
  Filled 2023-07-15 (×6): qty 5

## 2023-07-15 MED ORDER — ONDANSETRON HCL 4 MG/2ML IJ SOLN: 4.0000 mg | Freq: Four times a day (QID) | INTRAMUSCULAR | Status: DC | PRN

## 2023-07-15 MED ORDER — OSELTAMIVIR PHOSPHATE 30 MG PO CAPS
30.0000 mg | ORAL_CAPSULE | ORAL | Status: DC
Start: 2023-07-15 — End: 2023-07-15

## 2023-07-15 MED ORDER — ACETAMINOPHEN 650 MG RE SUPP: 650.0000 mg | Freq: Four times a day (QID) | RECTAL | Status: DC | PRN

## 2023-07-15 MED ORDER — HEPARIN BOLUS VIA INFUSION
1000.0000 [IU] | Freq: Once | INTRAVENOUS | Status: AC
  Administered 2023-07-15: 1000 [IU] via INTRAVENOUS
  Filled 2023-07-15: qty 1000

## 2023-07-15 MED ORDER — NITROGLYCERIN IN D5W 200-5 MCG/ML-% IV SOLN
0.0000 ug/min | INTRAVENOUS | Status: DC
Start: 2023-07-15 — End: 2023-07-21
  Administered 2023-07-15: 130 ug/min via INTRAVENOUS
  Administered 2023-07-15: 5 ug/min via INTRAVENOUS
  Administered 2023-07-16: 130 ug/min via INTRAVENOUS
  Administered 2023-07-16: 140 ug/min via INTRAVENOUS
  Administered 2023-07-16: 110 ug/min via INTRAVENOUS
  Administered 2023-07-17: 85 ug/min via INTRAVENOUS
  Filled 2023-07-15 (×7): qty 250

## 2023-07-15 MED ORDER — HEPARIN SODIUM (PORCINE) 5000 UNIT/ML IJ SOLN: 5000.0000 [IU] | Freq: Three times a day (TID) | INTRAMUSCULAR | Status: DC

## 2023-07-15 MED ORDER — ROSUVASTATIN CALCIUM 10 MG PO TABS
10.0000 mg | ORAL_TABLET | Freq: Every day | ORAL | Status: DC
  Administered 2023-07-15 – 2023-07-21 (×7): 10 mg via ORAL
  Filled 2023-07-15 (×7): qty 1

## 2023-07-15 NOTE — Progress Notes (Signed)
 PROGRESS NOTE    Matthew Robles  ZOX:[id number] DOB: 1956/04/02 DOA: 07/14/2023 PCP: Elmer Picker, [facility]   Brief Narrative:    Matthew Robles is a 68 y.o. male with medical history significant of hypertension, end-stage renal disease on hemodialysis (MWF), type 2 diabetes mellitus, history of stroke who presents to the emergency department from home via EMS due to increased work of breathing which started about 1 hour prior to arrival to the ED.  EMS was activated and on arrival of EMS team, O2 sat was noted to be 61% on home oxygen (patient states that he uses supplemental oxygen only as needed at home).  He was placed on NRB with O2 sat increasing to 73%.  O2 sat increased to 91% on CPAP per report.  Patient was admitted with acute hypoxemic respiratory failure secondary to acute on chronic HFmrEF with hypertensive emergency and is also noted to have elevated troponin levels.  He is a dialysis patient and nephrology plans to hemodialyzed and he has been started on nitroglycerin drip.  He is also noted to be positive for influenza A.  Assessment & Plan:   Principal Problem:   Acute on chronic combined systolic and diastolic CHF (congestive heart failure) (HCC) Active Problems:   ESRD on hemodialysis (HCC)   Acute respiratory failure with hypoxia (HCC)   Hypoalbuminemia due to protein-calorie malnutrition (HCC)   Elevated troponin   Essential hypertension   CAP (community acquired pneumonia)   Hypocalcemia   Type 2 diabetes mellitus with hyperglycemia (HCC)  Assessment and Plan:   Acute hypoxemic respiratory failure secondary to acute on chronic combined systolic and diastolic CHF Chest x-ray was suggestive of pulmonary edema BNP was 3,242 (this was 2781 on 03/27/2023) Continue total input/output, daily weights and fluid restriction IV Lasix 80 mg x 1 was given Continue heart healthy diet      Echocardiogram done in November 2024 showed LVEF of 45 to 50%.  LV  demonstrates global hypokinesis.  Moderate LVH.  G3 DD.  Echocardiogram ordered and pending This is possibly due to patient's fluid overload.  Continue BiPAP at this time with plan to wean patient off this as tolerated as some level based on Neurologist was consulted for dialysis in the morning  Hypertensive emergency Continue Norvasc 10 mg daily and hydralazine 100 mg 3 times daily Started on nitroglycerin drip per cardiology   Elevated troponin possibly due to type II demand ischemia, rule out NSTEMI Troponin 225 > 430 > 907>1202 Patient was started on heparin drip Patient denies chest pain Appreciate cardiology recommendations for evaluation of 2D echocardiogram with no suspicion of ACS at this time   ESRD on HD (MWF) Nephrology planning for dialysis   Presumed CAP POA along with influenza A Chest x-ray was suggestive of pneumonia Patient was started on ceftriaxone and azithromycin, we shall continue same at this time with plan to de-escalate/discontinue based on blood culture, sputum culture, urine Legionella, strep pneumo and procalcitonin elevated at 15.82 Tamiflu started Continue Tylenol as needed Continue Mucinex, incentive spirometry, flutter valve    Hypocalcemia Calcium 7.7 corrected calcium level based on albumin is 8.3 Continue Os-Cal   Hypoalbuminemia possible secondary to mild protein calorie malnutrition Albumin 3.2, protein supplement will be provided   Essential hypertension Continue amlodipine, hydralazine Continue IV hydralazine 10 mg every 6 hours as needed for SBP > 170   Type 2 diabetes mellitus with hyperglycemia Hemoglobin A1c at 5.8 Continue ISS and hypoglycemia protocol  DVT prophylaxis: Heparin drip Code Status:  Full Family Communication: None at bedside Disposition Plan:  Status is: Inpatient Remains inpatient appropriate because: Need for IV medications  Consultants:  Cardiology Nephrology  Procedures:  None  Antimicrobials:   Anti-infectives (From admission, onward)    Start     Dose/Rate Route Frequency Ordered Stop   07/15/23 1230  oseltamivir (TAMIFLU) capsule 30 mg        30 mg Oral Every M-W-F (Hemodialysis) 07/15/23 1140 07/27/23 1159   07/15/23 1200  oseltamivir (TAMIFLU) capsule 30 mg  Status:  Discontinued        30 mg Oral Every other day 07/15/23 1110 07/15/23 1116   07/15/23 0300  cefTRIAXone (ROCEPHIN) 1 g in sodium chloride 0.9 % 100 mL IVPB        1 g 200 mL/hr over 30 Minutes Intravenous Every 24 hours 07/15/23 0256     07/15/23 0300  azithromycin (ZITHROMAX) 500 mg in sodium chloride 0.9 % 250 mL IVPB        500 mg 250 mL/hr over 60 Minutes Intravenous Every 24 hours 07/15/23 0256         Subjective: Patient seen and evaluated today with no new acute complaints or concerns. No acute concerns or events noted overnight.  He denies any current chest pain or shortness of breath.  Blood pressures remain elevated.  Objective: Vitals:   07/15/23 1145 07/15/23 1150 07/15/23 1156 07/15/23 1200  BP: (!) 211/114 (!) 202/113 (!) 206/98 (!) 197/101  Pulse: (!) 110 (!) 107 (!) 105 (!) 105  Resp: (!) 24 (!) 27 (!) 25 (!) 26  Temp:      TempSrc:      SpO2: 94% 95% 94% 93%  Weight:      Height:        Intake/Output Summary (Last 24 hours) at 07/15/2023 1221 Last data filed at 07/15/2023 0601 Gross per 24 hour  Intake 350 ml  Output --  Net 350 ml   Filed Weights   07/14/23 1902  Weight: 72.1 kg    Examination:  General exam: Appears calm and comfortable  Respiratory system: Diminished to auscultation bilaterally. Respiratory effort normal.  BiPAP Cardiovascular system: S1 & S2 heard, RRR.  Gastrointestinal system: Abdomen is soft Central nervous system: Alert and awake Extremities: No edema Skin: No significant lesions noted Psychiatry: Flat affect.    Data Reviewed: I have personally reviewed following labs and imaging studies  CBC: Recent Labs  Lab 07/14/23 1910  07/14/23 2042 07/15/23 0119  WBC  --  13.4* 10.0  NEUTROABS  --  12.3*  --   HGB 11.9* 10.0* 9.2*  HCT 35.0* 30.3* 28.1*  MCV  --  96.8 96.2  PLT  --  227 196   Basic Metabolic Panel: Recent Labs  Lab 07/14/23 1910 07/14/23 2042 07/15/23 0119 07/15/23 0959  NA 136 133* 136  137  --   K 4.1 4.2 4.1  4.2  --   CL 100 99 100  99  --   CO2  --  19* 22  22  --   GLUCOSE 195* 174* 126*  127*  --   BUN 46* 53* 59*  58*  --   CREATININE 8.60* 8.14* 8.93*  8.96*  --   CALCIUM  --  7.7* 7.8*  7.9*  --   MG  --   --  2.1  --   PHOS  --   --  4.6  4.5 5.1*   GFR: Estimated Creatinine Clearance: 8 mL/min (A) (by  C-G formula based on SCr of 8.96 mg/dL (H)). Liver Function Tests: Recent Labs  Lab 07/14/23 2042 07/15/23 0119  AST 21 17  ALT 13 12  ALKPHOS 55 50  BILITOT 1.1 1.1  PROT 7.6 7.0  ALBUMIN 3.2* 2.9*  2.9*   No results for input(s): "LIPASE", "AMYLASE" in the last 168 hours. No results for input(s): "AMMONIA" in the last 168 hours. Coagulation Profile: No results for input(s): "INR", "PROTIME" in the last 168 hours. Cardiac Enzymes: No results for input(s): "CKTOTAL", "CKMB", "CKMBINDEX", "TROPONINI" in the last 168 hours. BNP (last 3 results) No results for input(s): "PROBNP" in the last 8760 hours. HbA1C: No results for input(s): "HGBA1C" in the last 72 hours. CBG: Recent Labs  Lab 07/14/23 2123  GLUCAP 165*   Lipid Profile: No results for input(s): "CHOL", "HDL", "LDLCALC", "TRIG", "CHOLHDL", "LDLDIRECT" in the last 72 hours. Thyroid Function Tests: No results for input(s): "TSH", "T4TOTAL", "FREET4", "T3FREE", "THYROIDAB" in the last 72 hours. Anemia Panel: No results for input(s): "VITAMINB12", "FOLATE", "FERRITIN", "TIBC", "IRON", "RETICCTPCT" in the last 72 hours. Sepsis Labs: Recent Labs  Lab 07/15/23 0119  PROCALCITON 15.82    Recent Results (from the past 240 hours)  Culture, blood (Routine X 2) w Reflex to ID Panel     Status:  None (Preliminary result)   Collection Time: 07/15/23  3:03 AM   Specimen: BLOOD  Result Value Ref Range Status   Specimen Description BLOOD BLOOD RIGHT ARM  Final   Special Requests NONE  Final   Culture   Final    NO GROWTH < 12 HOURS Performed at [facility], [address], [place], [place] [address]    Report Status PENDING  Incomplete  Culture, blood (Routine X 2) w Reflex to ID Panel     Status: None (Preliminary result)   Collection Time: 07/15/23  3:05 AM   Specimen: BLOOD  Result Value Ref Range Status   Specimen Description BLOOD BLOOD RIGHT HAND  Final   Special Requests NONE  Final   Culture   Final    NO GROWTH < 12 HOURS Performed at [facility], [address], [place], [place] [address]    Report Status PENDING  Incomplete  MRSA Next Gen by PCR, Nasal     Status: None   Collection Time: 07/15/23  8:40 AM   Specimen: Nasal Mucosa; Nasal Swab  Result Value Ref Range Status   MRSA by PCR Next Gen NOT DETECTED NOT DETECTED Final    Comment: (NOTE) The GeneXpert MRSA Assay (FDA approved for NASAL specimens only), is one component of a comprehensive MRSA colonization surveillance program. It is not intended to diagnose MRSA infection nor to guide or monitor treatment for MRSA infections. Test performance is not FDA approved in patients less than 37 years old. Performed at [facility], [address] [place], [place] [address]   Resp panel by RT-PCR (RSV, Flu A&B, Covid) Anterior Nasal Swab     Status: Abnormal   Collection Time: 07/15/23  9:21 AM   Specimen: Anterior Nasal Swab  Result Value Ref Range Status   SARS Coronavirus 2 by RT PCR NEGATIVE NEGATIVE Final    Comment: (NOTE) SARS-CoV-2 target nucleic acids are NOT DETECTED.  The SARS-CoV-2 RNA is generally detectable in upper respiratory specimens during the acute phase of infection. The lowest concentration of SARS-CoV-2 viral copies this assay can detect is 138 copies/mL. A negative result does not  preclude SARS-Cov-2 infection and should not be used  as the sole basis for treatment or other patient management decisions. A negative result may occur with  improper specimen collection/handling, submission of specimen other than nasopharyngeal swab, presence of viral mutation(s) within the areas targeted by this assay, and inadequate number of viral copies(<138 copies/mL). A negative result must be combined with clinical observations, patient history, and epidemiological information. The expected result is Negative.  Fact Sheet for Patients:  BloggerCourse.com  Fact Sheet for Healthcare Providers:  SeriousBroker.it  This test is no t yet approved or cleared by the [place] FDA and  has been authorized for detection and/or diagnosis of SARS-CoV-2 by FDA under an Emergency Use Authorization (EUA). This EUA will remain  in effect (meaning this test can be used) for the duration of the COVID-19 declaration under Section 564(b)(1) of the Act, 21 U.S.C.section 360bbb-3(b)(1), unless the authorization is terminated  or revoked sooner.       Influenza A by PCR POSITIVE (A) NEGATIVE Final   Influenza B by PCR NEGATIVE NEGATIVE Final    Comment: (NOTE) The Xpert Xpress SARS-CoV-2/FLU/RSV plus assay is intended as an aid in the diagnosis of influenza from Nasopharyngeal swab specimens and should not be used as a sole basis for treatment. Nasal washings and aspirates are unacceptable for Xpert Xpress SARS-CoV-2/FLU/RSV testing.  Fact Sheet for Patients: BloggerCourse.com  Fact Sheet for Healthcare Providers: SeriousBroker.it  This test is not yet approved or cleared by the [place] FDA and has been authorized for detection and/or diagnosis of SARS-CoV-2 by FDA under an Emergency Use Authorization (EUA). This EUA will remain in effect (meaning this test can be used) for the  duration of the COVID-19 declaration under Section 564(b)(1) of the Act, 21 U.S.C. section 360bbb-3(b)(1), unless the authorization is terminated or revoked.     Resp Syncytial Virus by PCR NEGATIVE NEGATIVE Final    Comment: (NOTE) Fact Sheet for Patients: BloggerCourse.com  Fact Sheet for Healthcare Providers: SeriousBroker.it  This test is not yet approved or cleared by the [place] FDA and has been authorized for detection and/or diagnosis of SARS-CoV-2 by FDA under an Emergency Use Authorization (EUA). This EUA will remain in effect (meaning this test can be used) for the duration of the COVID-19 declaration under Section 564(b)(1) of the Act, 21 U.S.C. section 360bbb-3(b)(1), unless the authorization is terminated or revoked.  Performed at [facility], [address], [place], [place] [address]          Radiology Studies: [facility] Chest [facility] 1 View Result Date: 07/14/2023 CLINICAL DATA:  Shortness of breath.  History of CHF. EXAM: PORTABLE CHEST 1 VIEW COMPARISON:  03/27/2023. FINDINGS: Redemonstration of moderate increased interstitial markings throughout bilateral lungs with bilateral hilar and lower lung zone predominance. There is superimposed heterogeneous opacity overlying the right lower lung zone partially obscuring the right lateral hemidiaphragm, concerning for pneumonia. Follow-up to clearing is recommended. Bilateral lung fields are otherwise clear. Left lateral costophrenic angle is clear. Stable cardio-mediastinal silhouette. No acute osseous abnormalities. The soft tissues are within normal limits. IMPRESSION: *Findings favor congestive heart failure/pulmonary edema. *There is superimposed heterogeneous opacity overlying the right lower lung zone partially obscuring the right lateral hemidiaphragm, concerning for pneumonia. Follow-up to clearing is recommended. Electronically Signed   By: Jules Schick M.D.   On:  07/14/2023 19:26        Scheduled Meds:  amLODipine  10 mg Oral Daily   aspirin EC  81 mg Oral Daily   calcium carbonate  1 tablet Oral Q  breakfast   Chlorhexidine Gluconate Cloth  6 each Topical Q0600   dextromethorphan-guaiFENesin  1 tablet Oral BID   feeding supplement (GLUCERNA SHAKE)  237 mL Oral TID BM   hydrALAZINE  100 mg Oral TID   oseltamivir  30 mg Oral Q M,W,F-HD   rosuvastatin  10 mg Oral Daily   Continuous Infusions:  azithromycin Stopped (07/15/23 0601)   cefTRIAXone (ROCEPHIN)  IV Stopped (07/15/23 0412)   heparin 900 Units/hr (07/15/23 0324)   nitroGLYCERIN 50 mcg/min (07/15/23 1211)     LOS: 1 day    Time spent: 55 minutes    Tyshan Enderle D Sherryll Burger, DO [facility]  If 7PM-7AM, please contact night-coverage [email or web address] 07/15/2023, 12:21 PM

## 2023-07-15 NOTE — TOC Initial Note (Signed)
 Transition of Care [facility]) - Initial/Assessment Note    Patient Details  Name: Matthew Robles MRN: [id number] Date of Birth: 1955-11-19  [facility] CM/SW Contact:    Villa Herb, LCSWA Phone Number: 07/15/2023, 11:43 AM  Clinical Narrative:                 Pt is high risk for readmission. Pt lives with his mother. Pt is independent in completing his ADLs. Pt drives to appointments and HD sessions. Pt has not had HH in the past. Pt states that he has a walker to use if needed. Pt has home O2 set up through [employer]. TOC to follow.    Barriers to Discharge: Continued Medical Work up   Patient Goals and CMS Choice Patient states their goals for this hospitalization and ongoing recovery are:: return home CMS Medicare.gov Compare Post Acute Care list provided to:: Patient Choice offered to / list presented to : Patient      Expected Discharge Plan and Services In-house Referral: Clinical Social Work Discharge Planning Services: CM Consult   Living arrangements for the past 2 months: Single Family Home                                      Prior Living Arrangements/Services Living arrangements for the past 2 months: Single Family Home Lives with:: Parents Patient language and need for interpreter reviewed:: Yes Do you feel safe going back to the place where you live?: Yes      Need for Family Participation in Patient Care: Yes (Comment) Care giver support system in place?: Yes (comment) Current home services: DME Criminal Activity/Legal Involvement Pertinent to Current Situation/Hospitalization: No - Comment as needed  Activities of Daily Living      Permission Sought/Granted                  Emotional Assessment Appearance:: Appears stated age Attitude/Demeanor/Rapport: Engaged Affect (typically observed): Accepting Orientation: : Oriented to Self, Oriented to Place, Oriented to  Time, Oriented to Situation Alcohol / Substance Use: Not  Applicable Psych Involvement: No (comment)  Admission diagnosis:  Acute on chronic combined systolic and diastolic CHF (congestive heart failure) (HCC) [I50.43] Systolic congestive heart failure, unspecified HF chronicity (HCC) [I50.20] Patient Active Problem List   Diagnosis Date Noted   CAP (community acquired pneumonia) 07/15/2023   Hypocalcemia 07/15/2023   Type 2 diabetes mellitus with hyperglycemia (HCC) 07/15/2023   Acute on chronic combined systolic and diastolic CHF (congestive heart failure) (HCC) 03/27/2023   Elevated troponin 03/27/2023   Controlled type 2 diabetes mellitus without complication, without long-term current use of insulin (HCC) 03/27/2023   Essential hypertension 03/27/2023   Hyperglycemia 02/15/2023   Hemoptysis 09/17/2022   Volume overload 09/16/2022   Acute respiratory failure (HCC) 09/16/2022   Acute HFrEF (heart failure with reduced ejection fraction) (HCC) 06/30/2022   ESRD on hemodialysis (HCC) 06/25/2022   Acute respiratory failure with hypoxia (HCC) 06/24/2022   Anemia of chronic disease 06/24/2022   Bilateral pleural effusion 06/24/2022   Hypoalbuminemia due to protein-calorie malnutrition (HCC) 06/24/2022   Elevated brain natriuretic peptide (BNP) level 06/24/2022   Hypertensive emergency 02/21/2018   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   DM (diabetes mellitus), type 2 with neurological complications (HCC) 01/15/2016   Hypokalemia 01/15/2016   PCP:  Elmer Picker, [facility] Pharmacy:   [facility] [address], [place] - [address] [place] [address] Phone: [phone] Fax: [phone]  [facility], [place] - [address] [place] [address] [place] [address] [place] [address] Phone: [phone] Fax: [phone]  [facility] [address] [place] - [address] Dol[place]is [place] [address] [place] [address] Phone: [phone] Fax: [phone]  [facility] [address] [place] [address] Phone: [phone] Fax: [phone]     Social Drivers of Health (SDOH) Social History: SDOH Screenings   Food Insecurity: No Food Insecurity (07/15/2023)  Housing: Low Risk  (03/28/2023)  Transportation Needs: No Transportation Needs (03/28/2023)  Utilities: Not At Risk (03/28/2023)  Tobacco Use: Medium Risk (07/14/2023)  Health Literacy: Low Risk  (06/16/2021)   Received from [facility]   SDOH Interventions:     Readmission Risk Interventions    07/15/2023   11:38 AM 02/16/2023   12:14 PM 09/17/2022    9:27 AM  Readmission Risk Prevention Plan  Transportation Screening Complete Complete Complete  HRI or Home Care Consult Complete  Complete  Social Work Consult for Recovery Care Planning/Counseling Complete  Complete  Palliative Care Screening Not Applicable  Not Applicable  Medication Review Oceanographer) Complete Complete Complete  HRI or Home Care Consult  Complete   SW Recovery Care/Counseling Consult  Complete   Palliative Care Screening  Not Applicable   Skilled Nursing Facility  Not Applicable

## 2023-07-15 NOTE — Consult Note (Addendum)
 Cardiology Consultation   Patient ID: Matthew Robles MRN: [id number]; DOB: 08/04/1955  Admit date: 07/14/2023 Date of Consult: 07/15/2023  PCP:  [facility] Providers Cardiologist:  Dina Rich, MD        Patient Profile:   Matthew Robles is a 68 y.o. 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 Thomes Dinning.  History of Present Illness:   Mr. Martine presented to [facility] ED on 07/14/2023 for evaluation of acutely worsening dyspnea. He reports he had been in his normal state of health prior to this and he completed dialysis the day prior. Says that he still produces urine and has been compliant with dialysis sessions. Denies any recent orthopnea, PND or pitting edema. No recent chest pain or palpitations.  Does not exercise routinely but reports being active around his home and also works on vehicles.  Upon EMS arrival, oxygen saturations were 61% on nasal cannula and he required NRB and CPAP. While in the ED, BP was significantly elevated at 215/109 upon arrival.  Was tachycardic as well with heart rate in the 110's to 120's. Initial labs showed WBC 13.4, Hgb 10.0, platelets 227, Na+ 133, K+ 4.2 and creatinine 8.14.  BNP 3242. Procalcitonin elevated at 15.82. Blood cultures pending. Initial and repeat hs Troponin values elevated at 225, 430, 907 and 1202. Resp panel not obtained. CXR showed CHF and pulmonary edema with superimposed heterogeneous opacity overlying the right lower lung concerning for pneumonia. EKG showed sinus tachycardia, heart rate 102 with LVH and TWI along the lateral leads which has been noted on prior tracings and likely due to repolarization abnormalities.  He received IV Lasix 80 mg x 1 and Nephrology has been consulted for plans for  dialysis today. Was also started on Ceftriaxone and Azithromycin for PNA. The cardiology fellow was consulted overnight and recommended starting IV Heparin for his NSTEMI.   Past Medical History:  Diagnosis Date   CHF (congestive heart failure) (HCC)    Chronic kidney disease    Diabetes mellitus without complication (HCC)    Hypertension    Stroke (HCC)    2017    Past Surgical History:  Procedure Laterality Date   AV FISTULA PLACEMENT Left 07/13/2022   Procedure: LEFT ARM ARTERIOVENOUS (AV) FISTULA CREATION;  Surgeon: [name], MD;  Location: [facility];  Service: Vascular;  Laterality: Left;   BASCILIC VEIN TRANSPOSITION Left 08/31/2022   Procedure: LEFT ARM SECOND STAGE BASILIC VEIN TRANSPOSITION;  Surgeon: [name], MD;  Location: [facility];  Service: Vascular;  Laterality: Left;   INSERTION OF DIALYSIS CATHETER Right 06/25/2022   Procedure: INSERTION OF DIALYSIS CATHETER;  Surgeon: Lucretia Roers, MD;  Location: [facility];  Service: General;  Laterality: Right;   WISDOM TOOTH EXTRACTION       Home Medications:  Prior to Admission medications   Medication Sig Start Date End Date Taking? Authorizing Provider  amLODipine (NORVASC) 10 MG tablet Take 1 tablet (10 mg total) by mouth daily. 04/01/23   Marinda Elk, MD  calcitRIOL (ROCALTROL) 0.5 MCG capsule Take 1 capsule (0.5 mcg total) by mouth daily. Patient not taking: Reported on 03/27/2023 07/05/22   Catarina Hartshorn, MD  calcium carbonate (TUMS - DOSED IN MG ELEMENTAL CALCIUM) 500 MG  chewable tablet Chew 2 tablets by mouth every dialysis for indigestion or heartburn.    [provider]  FEROSUL 325 (65 Fe) MG tablet Take 325 mg by mouth daily with breakfast. 08/18/22   [provider]  furosemide (LASIX) 80 MG tablet Take 1 tablet (80 mg total) by mouth daily. 04/01/23   Marinda Elk, MD  hydrALAZINE (APRESOLINE) 100 MG tablet Take 1 tablet (100 mg total) by mouth 3 (three) times daily. 04/01/23   Marinda Elk, MD  labetalol (NORMODYNE) 200 MG tablet Take 2 tablets (400 mg total) by mouth 2 (two) times daily. 04/01/23   Marinda Elk, MD  lidocaine-prilocaine (EMLA) cream Apply 1 Application topically. 09/21/22   [provider]  magnesium gluconate (MAGONATE) 500 MG tablet Take 500 mg by mouth 2 (two) times daily.    [provider]  vitamin B-12 (CYANOCOBALAMIN) 100 MCG tablet Take 100 mcg by mouth daily.    [provider]    Inpatient Medications: Scheduled Meds:  amLODipine  10 mg Oral Daily   aspirin EC  81 mg Oral Daily   calcium carbonate  1 tablet Oral Q breakfast   Chlorhexidine Gluconate Cloth  6 each Topical Q0600   dextromethorphan-guaiFENesin  1 tablet Oral BID   feeding supplement (GLUCERNA SHAKE)  237 mL Oral TID BM   hydrALAZINE  100 mg Oral TID   rosuvastatin  10 mg Oral Daily   Continuous Infusions:  azithromycin Stopped (07/15/23 0601)   cefTRIAXone (ROCEPHIN)  IV Stopped (07/15/23 0412)   heparin 900 Units/hr (07/15/23 0324)   PRN Meds: acetaminophen **OR** acetaminophen, hydrALAZINE, ondansetron **OR** ondansetron (ZOFRAN) IV  Allergies:   No Known Allergies  Social History:   Social History   Socioeconomic History   Marital status: Single    Spouse name: Not on file   Number of children: Not on file   Years of education: Not on file   Highest education level: Not on file  Occupational History   Not on file  Tobacco Use   Smoking status: Former    Current packs/day: 0.00    Types: Cigarettes    Quit date: 09/15/2017    Years since quitting: 5.8   Smokeless tobacco: Never  Vaping Use   Vaping status: Never Used  Substance and Sexual Activity   Alcohol use: Not Currently    Alcohol/week: 4.0 standard drinks of alcohol    Types: 4 Glasses of wine per week    Comment: month   Drug use: Not Currently    Types: Marijuana    Comment: occassionally   Sexual activity: Not on file  Other Topics Concern   Not  on file  Social History Narrative   Not on file   Social Drivers of Health   Financial Resource Strain: Not on file  Food Insecurity: No Food Insecurity (03/28/2023)   Hunger Vital Sign    Worried About Running Out of Food in the Last Year: Never true    Ran Out of Food in the Last Year: Never true  Transportation Needs: No Transportation Needs (03/28/2023)   PRAPARE - Administrator, Civil Service (Medical): No    Lack of Transportation (Non-Medical): No  Physical Activity: Not on file  Stress: Not on file  Social Connections: Not on file  Intimate Partner Violence: Not At Risk (03/28/2023)   Humiliation, Afraid, Rape, and Kick questionnaire    Fear of Current or Ex-Partner: No    Emotionally Abused:  No    Physically Abused: No    Sexually Abused: No    Family History:    Family History  Problem Relation Age of Onset   Diabetes Mother    Hypertension Mother    Diabetes Maternal Grandfather      ROS:  Please see the history of present illness.   All other ROS reviewed and negative.     Physical Exam/Data:   Vitals:   07/15/23 0514 07/15/23 0745 07/15/23 0757 07/15/23 0800  BP: (!) 147/91   (!) 172/97  Pulse: 100 95 92 100  Resp: 20 19 (!) 22 (!) 24  Temp:   (!) 100.4 F (38 C)   TempSrc:   Axillary   SpO2: 97% 95% 92% 90%  Weight:      Height:        Intake/Output Summary (Last 24 hours) at 07/15/2023 0913 Last data filed at 07/15/2023 0601 Gross per 24 hour  Intake 350 ml  Output --  Net 350 ml      07/14/2023    7:02 PM 04/01/2023    5:15 PM 04/01/2023    1:19 PM  Last 3 Weights  Weight (lbs) 158 lb 15.2 oz 158 lb 15.2 oz 165 lb 9.1 oz  Weight (kg) 72.1 kg 72.1 kg 75.1 kg     Body mass index is 21.56 kg/m.  General: Elderly male, appearing comfortable currently on BiPAP. HEENT: normal Neck: JVD difficult to assess given patient's current position and BiPAP. Vascular: No carotid bruits; Distal pulses 2+ bilaterally Cardiac:  normal S1,  S2; RRR; no murmur  Lungs: Rhonchi throughout. No wheezing. Abd: soft, nontender, no hepatomegaly  Ext: no pitting edema Musculoskeletal:  No deformities, BUE and BLE strength normal and equal Skin: warm and dry  Neuro:  CNs 2-12 intact, no focal abnormalities noted Psych:  Normal affect   EKG:  The EKG was personally reviewed and demonstrates: Sinus tachycardia, HR 102 with LVH and TWI along the lateral leads which has been noted on prior tracings and likely due to repolarization abnormalities.  Telemetry:  Telemetry was personally reviewed and demonstrates: Normal sinus rhythm with episodes of sinus tachycardia, heart rate in 90's to low 100's.  Relevant CV Studies:  Echocardiogram: 03/2023 IMPRESSIONS     1. Left ventricular ejection fraction, by estimation, is 45 to 50%. The  left ventricle has mildly decreased function. The left ventricle  demonstrates global hypokinesis. There is moderate concentric left  ventricular hypertrophy. Left ventricular  diastolic parameters are consistent with Grade III diastolic dysfunction  (restrictive). Elevated left atrial pressure. The E/e' is 27.4.   2. Right ventricular systolic function is mildly reduced. The right  ventricular size is mildly enlarged. There is severely elevated pulmonary  artery systolic pressure. The estimated right ventricular systolic  pressure is 61.0 mmHg.   3. Left atrial size was severely dilated.   4. Right atrial size was severely dilated.   5. The mitral valve is grossly normal. Mild mitral valve regurgitation.  No evidence of mitral stenosis.   6. The aortic valve is tricuspid. There is mild calcification of the  aortic valve. There is mild thickening of the aortic valve. Aortic valve  regurgitation is trivial. Aortic valve sclerosis is present, with no  evidence of aortic valve stenosis.   7. The inferior vena cava is dilated in size with <50% respiratory  variability, suggesting right atrial pressure of 15  mmHg.   Comparison(s): Changes from prior study are noted. LVEF unchanged. PA  pressures severely elevated.   Laboratory Data:  High Sensitivity Troponin:   Recent Labs  Lab 07/14/23 2042 07/14/23 2220 07/15/23 0119 07/15/23 0305  TROPONINIHS 225* 430* 907* 1,202*     Chemistry Recent Labs  Lab 07/14/23 1910 07/14/23 2042 07/15/23 0119  NA 136 133* 136  137  K 4.1 4.2 4.1  4.2  CL 100 99 100  99  CO2  --  19* 22  22  GLUCOSE 195* 174* 126*  127*  BUN 46* 53* 59*  58*  CREATININE 8.60* 8.14* 8.93*  8.96*  CALCIUM  --  7.7* 7.8*  7.9*  MG  --   --  2.1  GFRNONAA  --  7* 6*  6*  ANIONGAP  --  15 14  16*    Recent Labs  Lab 07/14/23 2042 07/15/23 0119  PROT 7.6 7.0  ALBUMIN 3.2* 2.9*  2.9*  AST 21 17  ALT 13 12  ALKPHOS 55 50  BILITOT 1.1 1.1   Lipids No results for input(s): "CHOL", "TRIG", "HDL", "LABVLDL", "LDLCALC", "CHOLHDL" in the last 168 hours.  Hematology Recent Labs  Lab 07/14/23 1910 07/14/23 2042 07/15/23 0119  WBC  --  13.4* 10.0  RBC  --  3.13* 2.92*  HGB 11.9* 10.0* 9.2*  HCT 35.0* 30.3* 28.1*  MCV  --  96.8 96.2  MCH  --  31.9 31.5  MCHC  --  33.0 32.7  RDW  --  14.6 14.6  PLT  --  227 196   Thyroid No results for input(s): "TSH", "FREET4" in the last 168 hours.  BNP Recent Labs  Lab 07/14/23 2042  BNP 3,242.0*    DDimer No results for input(s): "DDIMER" in the last 168 hours.   Radiology/Studies:  [facility] Chest Port 1 View Result Date: 07/14/2023 CLINICAL DATA:  Shortness of breath.  History of CHF. EXAM: PORTABLE CHEST 1 VIEW COMPARISON:  03/27/2023. FINDINGS: Redemonstration of moderate increased interstitial markings throughout bilateral lungs with bilateral hilar and lower lung zone predominance. There is superimposed heterogeneous opacity overlying the right lower lung zone partially obscuring the right lateral hemidiaphragm, concerning for pneumonia. Follow-up to clearing is recommended. Bilateral lung fields are  otherwise clear. Left lateral costophrenic angle is clear. Stable cardio-mediastinal silhouette. No acute osseous abnormalities. The soft tissues are within normal limits. IMPRESSION: *Findings favor congestive heart failure/pulmonary edema. *There is superimposed heterogeneous opacity overlying the right lower lung zone partially obscuring the right lateral hemidiaphragm, concerning for pneumonia. Follow-up to clearing is recommended. Electronically Signed   By: Jules Schick M.D.   On: 07/14/2023 19:26     Assessment and Plan:   1. NSTEMI - Presented with acute hypoxic respiratory failure and Hs Troponin values elevated at 225, 430, 907 and 1202. EKG shows sinus tachycardia, heart rate 102 with LVH and TWI along the lateral leads which has been noted on prior tracings and likely due to repolarization abnormalities. - While he did experience dyspnea yesterday, he denies any recent chest pain. Suspect his enzyme elevation is secondary to demand ischemia in the setting of hypoxia in the setting of fluid overload and hypertensive emergency. Will recheck a HS Troponin this morning. An echocardiogram is pending to assess for any structural abnormalities.  - Continue IV Heparin for now. Start ASA 81mg  daily and Crestor 10mg  daily (will check FLP). By review of records, he has had similar presentations in the past in the setting of hypertensive urgency/emergency but may require further ischemic evaluation if enzymes continue to  trend upwards or echocardiogram shows new abnormalities.  2. Acute on Chronic HFmrEF - His EF was at 45 to 50% by most recent echocardiogram. BNP 3242 on admission. Received IV Lasix 80 mg in the ED and I's and O's not currently recorded. Nephrology consult is pending and would defer volume management to them. He has been continued on Hydralazine 100 mg 3 times daily and was on Labetalol 400 mg twice daily as an outpatient but currently held given his respiratory status. Repeat  echocardiogram is pending for reassessment of EF and wall motion. If EF is further reduced, would consider the addition of nitrates. Not on an ACE-I/ARB/ARNI/MRA/SGLT2 inhibitor given his renal function.   3. Hypertensive Emergency - Similar presentations in the past in the setting of medication noncompliance but he reports he has been taking his medications regularly. BP was at 215/109 upon arrival, at 172/97 on most recent check. He did receive IV Labetalol 20 mg and IV Hydralazine 5 mg while in the ED and has been restarted on Amlodipine 10 mg daily and Hydralazine 100 mg 3 times daily. PTA Labetalol 400 mg twice daily currently held given his respiratory status.  4. Acute Hypoxic Respiratory Failure/CAP - Likely multifactorial in the setting of pneumonia and fluid overload. He did receive IV Lasix while in the ED and Nephrology consult is pending to assist with arranging HD today. Also started on IV antibiotics for CAP. Will check a respiratory panel as this has not yet been obtained.  5.  ESRD - On HD. MWF schedule. Nephrology consult pending.    For questions or updates, please contact [facility] Please consult [email or web address] for contact info under    Signed, Ellsworth Lennox, PA-C  07/15/2023 9:13 AM  Attending Note  Patient seen and discussed with PA Iran Ouch, I agree with her documentation. 68 yo male history of ESRD, HTN, chronic HFmrEF, prior CVA.Multiple prior admits with HTN emergency, with prior presentations with pulmonary edema and elevated troponins.   Presented with SOB, from ER notes by EMS evaluation sats in the 70s and placed on bipap. Reports compliance with home bp meds, dialysis sessions. SOB started yesterday and rapidly progressed.      ER vitals bp 215/109 HR 111  WBC 13.4 Hgb 10 Plt 227 Na 133 K 4.2 Cr 8.14 BUN 53 BNP 3242 Procalc 15.82  Trop 255-->430-->907-->1202 EKG sinus tach, mild ST depression V5/V6 and TWI chronic Influenza A + CXR  probable edema, probable pneumonia as well   1.HTN emergency - resented with bp 215/109, MAP 135.  - evidence of pulmonary edema, +troponin - MAPs remain 135 this morning, start NG gtt with goal to lower MAP 10-20% initially and a total of 25% over 24 hours. HD should also help with bp's. Avoid rapid correction of bp.  - 2020 [place] no evidence of renal artery stenosis - he is on oral norvasc 10mg , hydralazine 100mg  tid, start NG gtt.    2. Acute on chronic HFmrEF -on bipap  - BNP 3242, CXR edema and pneumonia - likely exacerbated by severe HTN, SBP 200s on presentation.  - fluid management per HD and nephrology. From there note plans for HD today.  - had been on entresto last year, unlcear why off at this time - f/u repeat echo.     3. Elevated troponin - trop up to 1202 without clear peak in setting of influenza A pneumonia, HTN emergency SBP in the 200s, ESRD, pulmonary edema, hypoxia, tachycardia.  - biggest component is  likely demand ischemia - EKG sinus tach chronic ST/T changes - f/u echo, establish trop peak.  - started on heparin  -several etiologies to be causing demand ishcemia, at this time do not suspect ACS. Can continue heparin for now and establish trop peak, f/u echo.    4. ESRD - per neprhology - from notes did go to HD yesterday   Dina Rich MD

## 2023-07-15 NOTE — ED Notes (Signed)
 RT at bedside.

## 2023-07-15 NOTE — Procedures (Signed)
 HD Note:  Some information was entered later than the data was gathered due to patient care needs. The stated time with the data is accurate.  Per Dr. Charlestine Massed, treatment is to be reduced to 3 hours, verbal order.  Patient treatment done at bedside  Alert and oriented.   Informed consent signed and in chart.   Access used: Left upper arm fistula Access issues: None  Patient had an episode of BP values dropping into the SBP in the 80s.  UF stopped, patient complained of dizziness and cramping in bilateral ribs.  Patient MAP of 64.  Medication management interventions by patient nurse.  UF goal lowered to amount removed, 2500 ml to support MAP goal of greater than 100.  TX duration: 3 hours  Alert, without acute distress.  Total UF removed: 2500 ml as was tolerated  Hand-off given to patient's nurse.    Rayvn Rickerson L. Dareen Piano, RN [facility].

## 2023-07-15 NOTE — ED Notes (Signed)
 This RN called MD Adefeso to make him aware of the critical Trop 907 was not able to speak to him.

## 2023-07-15 NOTE — ED Notes (Signed)
 Date and time results received: 07/15/23 0310 (use smartphrase ".now" to insert current time)  Test: Tropnin Critical Value: 907  Name of Provider Notified: MD Thomes Dinning  Orders Received? Or Actions Taken?:

## 2023-07-15 NOTE — ED Notes (Signed)
 Date and time results received: 07/15/23 0348 (use smartphrase ".now" to insert current time)  Test: troponin  Critical Value: 1202  Name of Provider Notified: MD Thomes Dinning  Orders Received? Or Actions Taken?:

## 2023-07-15 NOTE — Plan of Care (Signed)
  Problem: Education: Goal: Knowledge of General Education information will improve Description: Including pain rating scale, medication(s)/side effects and non-pharmacologic comfort measures 07/15/2023 1902 by Brent General, RN Outcome: Progressing 07/15/2023 1902 by Brent General, RN Outcome: Progressing   Problem: Health Behavior/Discharge Planning: Goal: Ability to manage health-related needs will improve 07/15/2023 1902 by Brent General, RN Outcome: Progressing 07/15/2023 1902 by Brent General, RN Outcome: Progressing   Problem: Clinical Measurements: Goal: Ability to maintain clinical measurements within normal limits will improve 07/15/2023 1902 by Brent General, RN Outcome: Progressing 07/15/2023 1902 by Brent General, RN Outcome: Progressing Goal: Will remain free from infection 07/15/2023 1902 by Brent General, RN Outcome: Progressing 07/15/2023 1902 by Brent General, RN Outcome: Progressing Goal: Diagnostic test results will improve 07/15/2023 1902 by Brent General, RN Outcome: Progressing 07/15/2023 1902 by Brent General, RN Outcome: Progressing Goal: Respiratory complications will improve 07/15/2023 1902 by Brent General, RN Outcome: Progressing 07/15/2023 1902 by Brent General, RN Outcome: Progressing Goal: Cardiovascular complication will be avoided 07/15/2023 1902 by Brent General, RN Outcome: Progressing 07/15/2023 1902 by Brent General, RN Outcome: Progressing   Problem: Activity: Goal: Risk for activity intolerance will decrease 07/15/2023 1902 by Brent General, RN Outcome: Progressing 07/15/2023 1902 by Brent General, RN Outcome: Progressing   Problem: Nutrition: Goal: Adequate nutrition will be maintained 07/15/2023 1902 by Brent General, RN Outcome: Progressing 07/15/2023 1902 by Brent General, RN Outcome: Progressing   Problem: Coping: Goal: Level of anxiety will decrease 07/15/2023 1902 by Brent General, RN Outcome:  Progressing 07/15/2023 1902 by Brent General, RN Outcome: Progressing   Problem: Elimination: Goal: Will not experience complications related to bowel motility 07/15/2023 1902 by Brent General, RN Outcome: Progressing 07/15/2023 1902 by Brent General, RN Outcome: Progressing Goal: Will not experience complications related to urinary retention 07/15/2023 1902 by Brent General, RN Outcome: Progressing 07/15/2023 1902 by Brent General, RN Outcome: Progressing   Problem: Pain Managment: Goal: General experience of comfort will improve and/or be controlled 07/15/2023 1902 by Brent General, RN Outcome: Progressing 07/15/2023 1902 by Brent General, RN Outcome: Progressing   Problem: Safety: Goal: Ability to remain free from injury will improve 07/15/2023 1902 by Brent General, RN Outcome: Progressing 07/15/2023 1902 by Brent General, RN Outcome: Progressing   Problem: Skin Integrity: Goal: Risk for impaired skin integrity will decrease 07/15/2023 1902 by Brent General, RN Outcome: Progressing 07/15/2023 1902 by Brent General, RN Outcome: Progressing

## 2023-07-15 NOTE — ED Notes (Signed)
 This RN was not given any new orders for the critical troponin of 1202, per MD Adefeso. MD Adefeso endorsed that he spoke to the Cardiologist and we are going to continue with the heparin and they will see him in the morning.

## 2023-07-15 NOTE — ED Notes (Signed)
 This RN had MD Adefeso paged for a critical Troponin of 1202 on this pt, waiting for a call back.

## 2023-07-15 NOTE — Consult Note (Signed)
 Reason for Consult: ESRD Referring Physician:  Dr. Thomes Dinning  Chief Complaint: Shortness of breath  Outpt HD Orders -HD at [facility] since 06/2022 on MWF schedule. 3h6min EDW 81.5kg Nipro Elisio 17H 400/500 2/2.5 Na 138 Heparin 1000U/hr loading of 2000U Mircera 50 q4weeks   Assessment/Plan: ESRD - on HD MWF last full treatment on Wednesday leaving a little above his EDW.  Planning on dialysis today and MWF regimen while he is in the hospital. Renal osteodystrophy - will phosphorus and adjust binders as needed. Anemia -at goal. NSTEMI by cardiology, started on heparin drip with likely demand ischemia hypertensive emergency and fluid overload. HFrEF (E 45-50% in 03/2023) HTN treated with amlodipine and hydralazine   HPI: Matthew Robles is an 68 y.o. male with a history of hypertension, type 2 diabetes, CVA, ESRD dialyzing Monday Wednesdays and Fridays presenting with increasing shortness of breath for approximately 1 hour before the arrival to the ED by EMS.  Patient does use supplemental oxygen at home with saturations have dropped down to the 60-73% range but improved to 91% with CPAP.  In the emergency room the patient was tachypneic with a hemoglobin of 10, BUN/creatinine of 53 and 8.14 with troponin level of 225 increasing to 430 and then 907 started on a heparin drip with a BNP of 3242.  Checks x-ray showed pulmonary vascular congestion and edema as well as an opacity in the right lower lung lobe patient was started on ceftriaxone and azithromycin.  Patient was noted to be hypertensive as well and given IV labetalol and hydralazine.  ROS Pertinent items are noted in HPI.  Chemistry and CBC: Creatinine, Ser  Date/Time Value Ref Range Status  07/15/2023 01:19 AM 8.96 (H) 0.61 - 1.24 mg/dL Final  91/47/8295 62:13 AM 8.93 (H) 0.61 - 1.24 mg/dL Final  08/65/7846 96:29 PM 8.14 (H) 0.61 - 1.24 mg/dL Final  52/84/1324 40:10 PM 8.60 (H) 0.61 - 1.24 mg/dL Final  27/25/3664 40:34 PM  9.74 (H) 0.61 - 1.24 mg/dL Final  74/25/9563 87:56 AM 9.35 (H) 0.61 - 1.24 mg/dL Final  43/32/9518 84:16 AM 7.88 (H) 0.61 - 1.24 mg/dL Final  60/63/0160 10:93 AM 9.50 (H) 0.61 - 1.24 mg/dL Final  23/55/7322 02:54 AM 9.70 (H) 0.61 - 1.24 mg/dL Final  27/10/2374 28:31 PM 9.50 (H) 0.61 - 1.24 mg/dL Final  51/76/1607 37:10 PM 8.55 (H) 0.61 - 1.24 mg/dL Final  62/69/4854 62:70 AM 7.93 (H) 0.61 - 1.24 mg/dL Final  35/00/9381 82:99 AM 5.82 (H) 0.61 - 1.24 mg/dL Final  37/16/9678 93:81 AM 8.00 (H) 0.61 - 1.24 mg/dL Final  01/75/1025 85:27 AM 6.10 (H) 0.61 - 1.24 mg/dL Final  78/24/2353 61:44 AM 8.14 (H) 0.61 - 1.24 mg/dL Final  31/54/0086 76:19 PM 8.30 (H) 0.61 - 1.24 mg/dL Final  50/93/2671 24:58 PM 7.44 (H) 0.61 - 1.24 mg/dL Final  09/98/3382 50:53 AM 6.60 (H) 0.61 - 1.24 mg/dL Final  97/67/3419 37:90 AM 5.07 (H) 0.61 - 1.24 mg/dL Final  24/01/7352 29:92 AM 7.60 (H) 0.61 - 1.24 mg/dL Final  42/68/3419 62:22 AM 6.21 (H) 0.61 - 1.24 mg/dL Final  97/98/9211 94:17 AM 6.53 (H) 0.61 - 1.24 mg/dL Final  40/81/4481 85:63 AM 4.52 (H) 0.61 - 1.24 mg/dL Final  14/97/0263 78:58 AM 7.12 (H) 0.61 - 1.24 mg/dL Final  85/06/7739 28:78 AM 7.24 (H) 0.61 - 1.24 mg/dL Final  67/67/2094 70:96 AM 6.18 (H) 0.61 - 1.24 mg/dL Final  28/36/6294 76:54 AM 4.97 (H) 0.61 - 1.24 mg/dL Final  65/07/5463 68:12 AM  6.62 (H) 0.61 - 1.24 mg/dL Final  16/02/9603 54:09 AM 5.43 (H) 0.61 - 1.24 mg/dL Final  81/19/1478 29:56 AM 7.80 (H) 0.61 - 1.24 mg/dL Final  21/30/8657 84:69 AM 6.81 (H) 0.61 - 1.24 mg/dL Final  62/95/2841 32:44 AM 9.26 (H) 0.61 - 1.24 mg/dL Final  05/26/7251 66:44 AM 8.00 (H) 0.61 - 1.24 mg/dL Final  03/47/4259 56:38 AM 10.26 (H) 0.61 - 1.24 mg/dL Final  75/64/3329 51:88 AM 12.34 (H) 0.61 - 1.24 mg/dL Final  41/66/0630 16:01 AM 12.27 (H) 0.61 - 1.24 mg/dL Final  09/32/3557 32:20 AM 12.15 (H) 0.61 - 1.24 mg/dL Final  25/42/7062 37:62 PM 3.44 (H) 0.61 - 1.24 mg/dL Final  83/15/1761 60:73 AM 2.63 (H) 0.61 - 1.24  mg/dL Final  71/10/2692 85:46 PM 2.21 (H) 0.61 - 1.24 mg/dL Final  27/07/5007 38:18 PM 2.25 (H) 0.61 - 1.24 mg/dL Final  29/93/7169 67:89 PM 2.27 (H) 0.61 - 1.24 mg/dL Final  38/02/1750 02:58 AM 2.30 (H) 0.61 - 1.24 mg/dL Final  52/77/8242 35:36 AM 2.19 (H) 0.61 - 1.24 mg/dL Final  14/43/1540 08:67 AM 2.47 (H) 0.61 - 1.24 mg/dL Final  61/95/0932 67:12 AM 2.89 (H) 0.61 - 1.24 mg/dL Final  45/80/9983 38:25 PM 2.28 (H) 0.61 - 1.24 mg/dL Final  05/39/7673 41:93 AM 2.24 (H) 0.61 - 1.24 mg/dL Final  79/06/4095 35:32 AM 1.95 (H) 0.61 - 1.24 mg/dL Final  99/24/2683 41:96 AM 2.71 (H) 0.61 - 1.24 mg/dL Final    Comment:    DELTA CHECK NOTED  01/15/2016 05:49 AM 1.30 (H) 0.61 - 1.24 mg/dL Final   Recent Labs  Lab 07/14/23 1910 07/14/23 2042 07/15/23 0119  NA 136 133* 136  137  K 4.1 4.2 4.1  4.2  CL 100 99 100  99  CO2  --  19* 22  22  GLUCOSE 195* 174* 126*  127*  BUN 46* 53* 59*  58*  CREATININE 8.60* 8.14* 8.93*  8.96*  CALCIUM  --  7.7* 7.8*  7.9*  PHOS  --   --  4.6  4.5   Recent Labs  Lab 07/14/23 1910 07/14/23 2042 07/15/23 0119  WBC  --  13.4* 10.0  NEUTROABS  --  12.3*  --   HGB 11.9* 10.0* 9.2*  HCT 35.0* 30.3* 28.1*  MCV  --  96.8 96.2  PLT  --  227 196   Liver Function Tests: Recent Labs  Lab 07/14/23 2042 07/15/23 0119  AST 21 17  ALT 13 12  ALKPHOS 55 50  BILITOT 1.1 1.1  PROT 7.6 7.0  ALBUMIN 3.2* 2.9*  2.9*   No results for input(s): "LIPASE", "AMYLASE" in the last 168 hours. No results for input(s): "AMMONIA" in the last 168 hours. Cardiac Enzymes: No results for input(s): "CKTOTAL", "CKMB", "CKMBINDEX", "TROPONINI" in the last 168 hours. Iron Studies: No results for input(s): "IRON", "TIBC", "TRANSFERRIN", "FERRITIN" in the last 72 hours. PT/INR: @LABRCNTIP (inr:5)  Xrays/Other Studies: ) Results for orders placed or performed during the hospital encounter of 07/14/23 (from the past 48 hours)  I-stat chem 8, ED (not at [facility], [facility] or  [facility])     Status: Abnormal   Collection Time: 07/14/23  7:10 PM  Result Value Ref Range   Sodium 136 135 - 145 mmol/L   Potassium 4.1 3.5 - 5.1 mmol/L   Chloride 100 98 - 111 mmol/L   BUN 46 (H) 8 - 23 mg/dL   Creatinine, Ser 2.22 (H) 0.61 - 1.24 mg/dL   Glucose, Bld 979 (  H) 70 - 99 mg/dL    Comment: Glucose reference range applies only to samples taken after fasting for at least 8 hours.   Calcium, Ion 1.03 (L) 1.15 - 1.40 mmol/L   TCO2 21 (L) 22 - 32 mmol/L   Hemoglobin 11.9 (L) 13.0 - 17.0 g/dL   HCT 65.7 (L) 84.6 - 96.2 %  CBC with Differential     Status: Abnormal   Collection Time: 07/14/23  8:42 PM  Result Value Ref Range   WBC 13.4 (H) 4.0 - 10.5 K/uL   RBC 3.13 (L) 4.22 - 5.81 MIL/uL   Hemoglobin 10.0 (L) 13.0 - 17.0 g/dL   HCT 95.2 (L) 84.1 - 32.4 %   MCV 96.8 80.0 - 100.0 fL   MCH 31.9 26.0 - 34.0 pg   MCHC 33.0 30.0 - 36.0 g/dL   RDW 40.1 02.7 - 25.3 %   Platelets 227 150 - 400 K/uL   nRBC 0.0 0.0 - 0.2 %   Neutrophils Relative % 91 %   Neutro Abs 12.3 (H) 1.7 - 7.7 K/uL   Lymphocytes Relative 1 %   Lymphs Abs 0.1 (L) 0.7 - 4.0 K/uL   Monocytes Relative 6 %   Monocytes Absolute 0.8 0.1 - 1.0 K/uL   Eosinophils Relative 0 %   Eosinophils Absolute 0.1 0.0 - 0.5 K/uL   Basophils Relative 1 %   Basophils Absolute 0.1 0.0 - 0.1 K/uL   Immature Granulocytes 1 %   Abs Immature Granulocytes 0.08 (H) 0.00 - 0.07 K/uL    Comment: Performed at [facility], [address], [place], [place] [address]  Comprehensive metabolic panel     Status: Abnormal   Collection Time: 07/14/23  8:42 PM  Result Value Ref Range   Sodium 133 (L) 135 - 145 mmol/L   Potassium 4.2 3.5 - 5.1 mmol/L   Chloride 99 98 - 111 mmol/L   CO2 19 (L) 22 - 32 mmol/L   Glucose, Bld 174 (H) 70 - 99 mg/dL    Comment: Glucose reference range applies only to samples taken after fasting for at least 8 hours.   BUN 53 (H) 8 - 23 mg/dL   Creatinine, Ser 3.47 (H) 0.61 - 1.24 mg/dL   Calcium 7.7 (L) 8.9 -  10.3 mg/dL   Total Protein 7.6 6.5 - 8.1 g/dL   Albumin 3.2 (L) 3.5 - 5.0 g/dL   AST 21 15 - 41 U/L   ALT 13 0 - 44 U/L   Alkaline Phosphatase 55 38 - 126 U/L   Total Bilirubin 1.1 0.0 - 1.2 mg/dL   GFR, Estimated 7 (L) >60 mL/min    Comment: (NOTE) Calculated using the CKD-EPI Creatinine Equation (2021)    Anion gap 15 5 - 15    Comment: Performed at [facility], [address] [place], [place] [address]  Troponin I (High Sensitivity)     Status: Abnormal   Collection Time: 07/14/23  8:42 PM  Result Value Ref Range   Troponin I (High Sensitivity) 225 (HH) <18 ng/L    Comment: CRITICAL RESULT CALLED TO, READ BACK BY AND VERIFIED WITH CHARLOTTE TURNER [id number], VIRAY,J (NOTE) Elevated high sensitivity troponin I (hsTnI) values and significant  changes across serial measurements may suggest ACS but many other  chronic and acute conditions are known to elevate hsTnI results.  Refer to the "Links" section for chest pain algorithms and additional  guidance. Performed at [facility], [address] [place], [place] [address]  Brain natriuretic peptide     Status: Abnormal   Collection Time: 07/14/23  8:42 PM  Result Value Ref Range   B Natriuretic Peptide 3,242.0 (H) 0.0 - 100.0 pg/mL    Comment: Performed at [facility], [address], [place], [place] [address]  CBG monitoring, ED     Status: Abnormal   Collection Time: 07/14/23  9:23 PM  Result Value Ref Range   Glucose-Capillary 165 (H) 70 - 99 mg/dL    Comment: Glucose reference range applies only to samples taken after fasting for at least 8 hours.  Troponin I (High Sensitivity)     Status: Abnormal   Collection Time: 07/14/23 10:20 PM  Result Value Ref Range   Troponin I (High Sensitivity) 430 (HH) <18 ng/L    Comment: CRITICAL RESULT CALLED TO, READ BACK BY AND VERIFIED WITH JENNIFER KENNON [id number], VIRAY,J (NOTE) Elevated high sensitivity troponin I (hsTnI) values and significant  changes across serial  measurements may suggest ACS but many other  chronic and acute conditions are known to elevate hsTnI results.  Refer to the "Links" section for chest pain algorithms and additional  guidance. Performed at [facility], [address], [place], [place] [address]   HIV Antibody (routine testing w rflx)     Status: None   Collection Time: 07/14/23 10:20 PM  Result Value Ref Range   HIV Screen 4th Generation wRfx Non Reactive Non Reactive    Comment: Performed at [facility], [address] [place], [place] [address]  Hepatitis B surface antigen     Status: None   Collection Time: 07/15/23  1:19 AM  Result Value Ref Range   Hepatitis B Surface Ag NON REACTIVE NON REACTIVE    Comment: Performed at [facility], [address], [place], [place] [address]  Renal function panel     Status: Abnormal   Collection Time: 07/15/23  1:19 AM  Result Value Ref Range   Sodium 137 135 - 145 mmol/L   Potassium 4.2 3.5 - 5.1 mmol/L   Chloride 99 98 - 111 mmol/L   CO2 22 22 - 32 mmol/L   Glucose, Bld 127 (H) 70 - 99 mg/dL    Comment: Glucose reference range applies only to samples taken after fasting for at least 8 hours.   BUN 58 (H) 8 - 23 mg/dL   Creatinine, Ser 9.56 (H) 0.61 - 1.24 mg/dL   Calcium 7.9 (L) 8.9 - 10.3 mg/dL   Phosphorus 4.5 2.5 - 4.6 mg/dL   Albumin 2.9 (L) 3.5 - 5.0 g/dL   GFR, Estimated 6 (L) >60 mL/min    Comment: (NOTE) Calculated using the CKD-EPI Creatinine Equation (2021)    Anion gap 16 (H) 5 - 15    Comment: Performed at [facility], [address], [place], [place] [address]  Comprehensive metabolic panel     Status: Abnormal   Collection Time: 07/15/23  1:19 AM  Result Value Ref Range   Sodium 136 135 - 145 mmol/L   Potassium 4.1 3.5 - 5.1 mmol/L   Chloride 100 98 - 111 mmol/L   CO2 22 22 - 32 mmol/L   Glucose, Bld 126 (H) 70 - 99 mg/dL    Comment: Glucose reference range applies only to samples taken after fasting for at least 8 hours.   BUN 59 (H)  8 - 23 mg/dL   Creatinine, Ser 4.33 (H) 0.61 - 1.24 mg/dL   Calcium 7.8 (L) 8.9 - 10.3  mg/dL   Total Protein 7.0 6.5 - 8.1 g/dL   Albumin 2.9 (L) 3.5 - 5.0 g/dL   AST 17 15 - 41 U/L   ALT 12 0 - 44 U/L   Alkaline Phosphatase 50 38 - 126 U/L   Total Bilirubin 1.1 0.0 - 1.2 mg/dL   GFR, Estimated 6 (L) >60 mL/min    Comment: (NOTE) Calculated using the CKD-EPI Creatinine Equation (2021)    Anion gap 14 5 - 15    Comment: Performed at [facility], [address] [place], [place] [address]  CBC     Status: Abnormal   Collection Time: 07/15/23  1:19 AM  Result Value Ref Range   WBC 10.0 4.0 - 10.5 K/uL   RBC 2.92 (L) 4.22 - 5.81 MIL/uL   Hemoglobin 9.2 (L) 13.0 - 17.0 g/dL   HCT 60.4 (L) 54.0 - 98.1 %   MCV 96.2 80.0 - 100.0 fL   MCH 31.5 26.0 - 34.0 pg   MCHC 32.7 30.0 - 36.0 g/dL   RDW 19.1 47.8 - 29.5 %   Platelets 196 150 - 400 K/uL   nRBC 0.0 0.0 - 0.2 %    Comment: Performed at [facility], [address] [place], [place] [address]  Magnesium     Status: None   Collection Time: 07/15/23  1:19 AM  Result Value Ref Range   Magnesium 2.1 1.7 - 2.4 mg/dL    Comment: Performed at [facility], [address] [place], [place] [address]  Phosphorus     Status: None   Collection Time: 07/15/23  1:19 AM  Result Value Ref Range   Phosphorus 4.6 2.5 - 4.6 mg/dL    Comment: Performed at [facility], [address] [place], [place] [address]  Troponin I (High Sensitivity)     Status: Abnormal   Collection Time: 07/15/23  1:19 AM  Result Value Ref Range   Troponin I (High Sensitivity) 907 (HH) <18 ng/L    Comment: CRITICAL RESULT CALLED TO, READ BACK BY AND VERIFIED WITH Carrington Clamp [phone], VIRAY,J (NOTE) Elevated high sensitivity troponin I (hsTnI) values and significant  changes across serial measurements may suggest ACS but many other  chronic and acute conditions are known to elevate hsTnI results.  Refer to the "Links" section for chest pain algorithms and  additional  guidance. Performed at [facility], [address] [place], [place] [address]   Procalcitonin     Status: None   Collection Time: 07/15/23  1:19 AM  Result Value Ref Range   Procalcitonin 15.82 ng/mL    Comment:        Interpretation: PCT >= 10 ng/mL: Important systemic inflammatory response, almost exclusively due to severe bacterial sepsis or septic shock. (NOTE)       Sepsis PCT Algorithm           Lower Respiratory Tract                                      Infection PCT Algorithm    ----------------------------     ----------------------------         PCT < 0.25 ng/mL                PCT < 0.10 ng/mL          Strongly encourage             Strongly discourage  discontinuation of antibiotics    initiation of antibiotics    ----------------------------     -----------------------------       PCT 0.25 - 0.50 ng/mL            PCT 0.10 - 0.25 ng/mL               OR       >80% decrease in PCT            Discourage initiation of                                            antibiotics      Encourage discontinuation           of antibiotics    ----------------------------     -----------------------------         PCT >= 0.50 ng/mL              PCT 0.26 - 0.50 ng/mL                AND       <80% decrease in PCT             Encourage initiation of                                             antibiotics       Encourage continuation           of antibiotics    ----------------------------     -----------------------------        PCT >= 0.50 ng/mL                  PCT > 0.50 ng/mL               AND         increase in PCT                  Strongly encourage                                      initiation of antibiotics    Strongly encourage escalation           of antibiotics                                     -----------------------------                                           PCT <= 0.25 ng/mL                                                 OR                                         >  80% decrease in PCT                                      Discontinue / Do not initiate                                             antibiotics  Performed at [facility], [address] [place], [place] [address]   Culture, blood (Routine X 2) w Reflex to ID Panel     Status: None (Preliminary result)   Collection Time: 07/15/23  3:03 AM   Specimen: BLOOD  Result Value Ref Range   Specimen Description BLOOD BLOOD RIGHT ARM    Special Requests NONE    Culture      NO GROWTH < 12 HOURS Performed at [facility], [address] [place], [place] [address]    Report Status PENDING   Troponin I (High Sensitivity)     Status: Abnormal   Collection Time: 07/15/23  3:05 AM  Result Value Ref Range   Troponin I (High Sensitivity) 1,202 (HH) <18 ng/L    Comment: CRITICAL RESULT CALLED TO, READ BACK BY AND VERIFIED WITH Carrington Clamp [id number], VIRAY,J (NOTE) Elevated high sensitivity troponin I (hsTnI) values and significant  changes across serial measurements may suggest ACS but many other  chronic and acute conditions are known to elevate hsTnI results.  Refer to the "Links" section for chest pain algorithms and additional  guidance. Performed at [facility], [address], [place], [place] [address]   Culture, blood (Routine X 2) w Reflex to ID Panel     Status: None (Preliminary result)   Collection Time: 07/15/23  3:05 AM   Specimen: BLOOD  Result Value Ref Range   Specimen Description BLOOD BLOOD RIGHT HAND    Special Requests NONE    Culture      NO GROWTH < 12 HOURS Performed at [facility], [address], [place], [place] [address]    Report Status PENDING    DG Chest Port 1 View Result Date: 07/14/2023 CLINICAL DATA:  Shortness of breath.  History of CHF. EXAM: PORTABLE CHEST 1 VIEW COMPARISON:  03/27/2023. FINDINGS: Redemonstration of moderate increased interstitial markings throughout bilateral lungs with bilateral hilar and lower lung zone  predominance. There is superimposed heterogeneous opacity overlying the right lower lung zone partially obscuring the right lateral hemidiaphragm, concerning for pneumonia. Follow-up to clearing is recommended. Bilateral lung fields are otherwise clear. Left lateral costophrenic angle is clear. Stable cardio-mediastinal silhouette. No acute osseous abnormalities. The soft tissues are within normal limits. IMPRESSION: *Findings favor congestive heart failure/pulmonary edema. *There is superimposed heterogeneous opacity overlying the right lower lung zone partially obscuring the right lateral hemidiaphragm, concerning for pneumonia. Follow-up to clearing is recommended. Electronically Signed   By: Jules Schick M.D.   On: 07/14/2023 19:26    PMH:   Past Medical History:  Diagnosis Date   CHF (congestive heart failure) (HCC)    Chronic kidney disease    Diabetes mellitus without complication (HCC)    Hypertension    Stroke (HCC)    2017    PSH:   Past Surgical History:  Procedure Laterality Date   AV FISTULA PLACEMENT Left 07/13/2022   Procedure: LEFT ARM ARTERIOVENOUS (AV) FISTULA CREATION;  Surgeon:  [name], MD;  Location: [facility];  Service: Vascular;  Laterality: Left;   BASCILIC VEIN TRANSPOSITION Left 08/31/2022   Procedure: LEFT ARM SECOND STAGE BASILIC VEIN TRANSPOSITION;  Surgeon: [name], MD;  Location: [facility];  Service: Vascular;  Laterality: Left;   INSERTION OF DIALYSIS CATHETER Right 06/25/2022   Procedure: INSERTION OF DIALYSIS CATHETER;  Surgeon: Lucretia Roers, MD;  Location: [facility];  Service: General;  Laterality: Right;   WISDOM TOOTH EXTRACTION      Allergies: No Known Allergies  Medications:   Prior to Admission medications   Medication Sig Start Date End Date Taking? Authorizing Provider  amLODipine (NORVASC) 10 MG tablet Take 1 tablet (10 mg total) by mouth daily. 04/01/23   Marinda Elk, MD  calcitRIOL (ROCALTROL) 0.5 MCG capsule Take 1 capsule  (0.5 mcg total) by mouth daily. Patient not taking: Reported on 03/27/2023 07/05/22   Catarina Hartshorn, MD  calcium carbonate (TUMS - DOSED IN MG ELEMENTAL CALCIUM) 500 MG chewable tablet Chew 2 tablets by mouth every dialysis for indigestion or heartburn.    [provider]  FEROSUL 325 (65 Fe) MG tablet Take 325 mg by mouth daily with breakfast. 08/18/22   [provider]  furosemide (LASIX) 80 MG tablet Take 1 tablet (80 mg total) by mouth daily. 04/01/23   Marinda Elk, MD  hydrALAZINE (APRESOLINE) 100 MG tablet Take 1 tablet (100 mg total) by mouth 3 (three) times daily. 04/01/23   Marinda Elk, MD  labetalol (NORMODYNE) 200 MG tablet Take 2 tablets (400 mg total) by mouth 2 (two) times daily. 04/01/23   Marinda Elk, MD  lidocaine-prilocaine (EMLA) cream Apply 1 Application topically. 09/21/22   [provider]  magnesium gluconate (MAGONATE) 500 MG tablet Take 500 mg by mouth 2 (two) times daily.    [provider]  vitamin B-12 (CYANOCOBALAMIN) 100 MCG tablet Take 100 mcg by mouth daily.    [provider]    Discontinued Meds:   Medications Discontinued During This Encounter  Medication Reason   labetalol (NORMODYNE) tablet 200 mg    heparin injection 5,000 Units     Social History:  reports that he quit smoking about 5 years ago. His smoking use included cigarettes. He has never used smokeless tobacco. He reports that he does not currently use alcohol after a past usage of about 4.0 standard drinks of alcohol per week. He reports that he does not currently use drugs after having used the following drugs: Marijuana.  Family History:   Family History  Problem Relation Age of Onset   Diabetes Mother    Hypertension Mother    Diabetes Maternal Grandfather     Blood pressure (!) 172/97, pulse 100, temperature (!) 100.4 F (38 C), temperature source Axillary, resp. rate (!) 24, height 6' (1.829 m), weight 72.1 kg, SpO2  90%. General appearance: alert, cooperative, and appears stated age Head: Normocephalic, without obvious abnormality, atraumatic Eyes: negative Neck: no adenopathy, no carotid bruit, supple, symmetrical, trachea midline, and thyroid not enlarged, symmetric, no tenderness/mass/nodules Back: symmetric, no curvature. ROM normal. No CVA tenderness. Resp: rales bibasilar Cardio: tachy GI: soft, non-tender; bowel sounds normal; no masses,  no organomegaly Extremities: extremities normal, atraumatic, no cyanosis or edema Pulses: 2+ and symmetric Access: lt BBT good thrill       Khyler Urda, Len Blalock, MD 07/15/2023, 9:38 AM

## 2023-07-15 NOTE — ED Notes (Signed)
 Pt resting/sleeping well, easily awakes. Sats on bipap are 88%, pt in no distress and denies shob, no leaking from mask noted. RT aware and will be down.

## 2023-07-15 NOTE — Progress Notes (Signed)
 PHARMACY - ANTICOAGULATION CONSULT NOTE  Pharmacy Consult for Heparin Indication: chest pain/ACS  No Known Allergies  Patient Measurements: Height: 6' (182.9 cm) Weight: 72.1 kg (158 lb 15.2 oz) IBW/kg (Calculated) : 77.6  Vital Signs: BP: 176/93 (02/21 0130) Pulse Rate: 87 (02/21 0130)  Labs: Recent Labs    07/14/23 1910 07/14/23 2042 07/14/23 2220 07/15/23 0119  HGB 11.9* 10.0*  --  9.2*  HCT 35.0* 30.3*  --  28.1*  PLT  --  227  --  196  CREATININE 8.60* 8.14*  --  8.93*  8.96*  TROPONINIHS  --  225* 430* 907*    Estimated Creatinine Clearance: 8 mL/min (A) (by C-G formula based on SCr of 8.96 mg/dL (H)).   Medical History: Past Medical History:  Diagnosis Date   CHF (congestive heart failure) (HCC)    Chronic kidney disease    Diabetes mellitus without complication (HCC)    Hypertension    Stroke (HCC)    2017    Medications:  No current facility-administered medications on file prior to encounter.   Current Outpatient Medications on File Prior to Encounter  Medication Sig Dispense Refill   amLODipine (NORVASC) 10 MG tablet Take 1 tablet (10 mg total) by mouth daily. 90 tablet 1   calcitRIOL (ROCALTROL) 0.5 MCG capsule Take 1 capsule (0.5 mcg total) by mouth daily. (Patient not taking: Reported on 03/27/2023) 30 capsule 1   calcium carbonate (TUMS - DOSED IN MG ELEMENTAL CALCIUM) 500 MG chewable tablet Chew 2 tablets by mouth every dialysis for indigestion or heartburn.     FEROSUL 325 (65 Fe) MG tablet Take 325 mg by mouth daily with breakfast.     furosemide (LASIX) 80 MG tablet Take 1 tablet (80 mg total) by mouth daily. 90 tablet 1   hydrALAZINE (APRESOLINE) 100 MG tablet Take 1 tablet (100 mg total) by mouth 3 (three) times daily. 90 tablet 0   labetalol (NORMODYNE) 200 MG tablet Take 2 tablets (400 mg total) by mouth 2 (two) times daily. 90 tablet 0   lidocaine-prilocaine (EMLA) cream Apply 1 Application topically.     magnesium gluconate (MAGONATE)  500 MG tablet Take 500 mg by mouth 2 (two) times daily.     vitamin B-12 (CYANOCOBALAMIN) 100 MCG tablet Take 100 mcg by mouth daily.       Assessment: 68 y.o. male admitted with CHF/NSTEMI for heparin  Goal of Therapy:  Heparin level 0.3-0.7 units/ml Monitor platelets by anticoagulation protocol: Yes   Plan:  Heparin 4000 units IV bolus, then start heparin 900 units/hr Check heparin level in 8 hours.   Marsia Cino, Gary Fleet 07/15/2023,2:37 AM

## 2023-07-15 NOTE — Progress Notes (Incomplete)
Attending Note  Patient seen and discussed with PA [place] Ouch, I agree with her documentation. 68 yo male history of ESRD, HTN, chronic HFmrEF, prior CVA.Multiple prior admits with HTN emergency, with prior presentations with pulmonary edema and elevated troponins.   Presented with SOB, from ER notes by EMS evaluation sats in the 70s and placed on bipap. Reports compliance with home bp meds, dialysis sessions. SOB started yesterday and rapidly progressed.      ER vitals bp 215/109 HR 111  WBC 13.4 Hgb 10 Plt 227 Na 133 K 4.2 Cr 8.14 BUN 53 BNP 3242 Procalc 15.82  Trop 255-->430-->907-->1202 EKG sinus tach, mild ST depression V5/V6 and TWI chronic Influenza A + CXR probable edema, probable pneumonia as well   1.HTN emergency - resented with bp 215/109, MAP 135.  - evidence of pulmonary edema, +troponin - MAPs remain 135 this morning, start NG gtt with goal to lower MAP 10-20% initially and a total of 25% over 24 hours. HD should also help with bp's.  - 2020 [place] no evidence of renal artery stenosis - he is on oral norvasc 10mg , hydralazine 100mg  tid, start NG gtt.    2. Acute on chronic HFmrEF -on bipap  - BNP 3242, CXR edema and pneumonia - likely exacerbated by severe HTN, SBP 200s on presentation.  - fluid management per HD and nephrology. From there note plans for HD today.  - had been on entresto last year, unlcear why off at this time - f/u repeat echo.     3. Elevated troponin - trop up to 1202 without clear peak in setting of influenza A pneumonia, HTN emergency SBP in the 200s, ESRD, pulmonary edema, hypoxia, tachycardia.  - biggest component is likely demand ischemia - EKG sinus tach chronic ST/T changes - f/u echo, establish trop peak.  - started on heparin  -several etiologies to be causing demand ishcemia, at this time do not suspect ACS. Can continue heparin for now and establish trop peak, f/u echo.    4. ESRD - per neprhology - from notes did go to HD  yesterday   Dina Rich MD

## 2023-07-15 NOTE — Plan of Care (Signed)

## 2023-07-15 NOTE — Plan of Care (Signed)
  Problem: Clinical Measurements: Goal: Diagnostic test results will improve Outcome: Progressing Goal: Respiratory complications will improve Outcome: Progressing Goal: Cardiovascular complication will be avoided Outcome: Progressing   Problem: Activity: Goal: Risk for activity intolerance will decrease Outcome: Progressing   Problem: Coping: Goal: Level of anxiety will decrease Outcome: Progressing   Problem: Pain Managment: Goal: General experience of comfort will improve and/or be controlled Outcome: Progressing   Problem: Safety: Goal: Ability to remain free from injury will improve Outcome: Progressing   Problem: Skin Integrity: Goal: Risk for impaired skin integrity will decrease Outcome: Progressing

## 2023-07-15 NOTE — Progress Notes (Signed)
 PHARMACY - ANTICOAGULATION CONSULT NOTE  Pharmacy Consult for Heparin Indication: chest pain/ACS  No Known Allergies  Patient Measurements: Height: 6' (182.9 cm) Weight: 72.1 kg (158 lb 15.2 oz) IBW/kg (Calculated) : 77.6 HEPARIN DW (KG): 72.1   Vital Signs: Temp: 99.2 F (37.3 C) (02/21 1954) Temp Source: Axillary (02/21 1954) BP: 203/79 (02/21 2030) Pulse Rate: 83 (02/21 2030)  Labs: Recent Labs    07/14/23 1910 07/14/23 2042 07/14/23 2220 07/15/23 0119 07/15/23 0305 07/15/23 1210 07/15/23 1939  HGB 11.9* 10.0*  --  9.2*  --   --   --   HCT 35.0* 30.3*  --  28.1*  --   --   --   PLT  --  227  --  196  --   --   --   HEPARINUNFRC  --   --   --   --   --  0.29* 0.70  CREATININE 8.60* 8.14*  --  8.93*  8.96*  --   --   --   TROPONINIHS  --  225*   < > 907* 1,202* 1,456*  --    < > = values in this interval not displayed.    Estimated Creatinine Clearance: 8 mL/min (A) (by C-G formula based on SCr of 8.96 mg/dL (H)).   Medical History: Past Medical History:  Diagnosis Date   CHF (congestive heart failure) (HCC)    Chronic kidney disease    Diabetes mellitus without complication (HCC)    Hypertension    Stroke (HCC)    2017    Medications:  No current facility-administered medications on file prior to encounter.   Current Outpatient Medications on File Prior to Encounter  Medication Sig Dispense Refill   amLODipine (NORVASC) 10 MG tablet Take 1 tablet (10 mg total) by mouth daily. 90 tablet 1   calcitRIOL (ROCALTROL) 0.5 MCG capsule Take 1 capsule (0.5 mcg total) by mouth daily. (Patient not taking: Reported on 03/27/2023) 30 capsule 1   calcium carbonate (TUMS - DOSED IN MG ELEMENTAL CALCIUM) 500 MG chewable tablet Chew 2 tablets by mouth every dialysis for indigestion or heartburn.     FEROSUL 325 (65 Fe) MG tablet Take 325 mg by mouth daily with breakfast.     furosemide (LASIX) 80 MG tablet Take 1 tablet (80 mg total) by mouth daily. 90 tablet 1    hydrALAZINE (APRESOLINE) 100 MG tablet Take 1 tablet (100 mg total) by mouth 3 (three) times daily. 90 tablet 0   labetalol (NORMODYNE) 200 MG tablet Take 2 tablets (400 mg total) by mouth 2 (two) times daily. 90 tablet 0   lidocaine-prilocaine (EMLA) cream Apply 1 Application topically.     magnesium gluconate (MAGONATE) 500 MG tablet Take 500 mg by mouth 2 (two) times daily.     vitamin B-12 (CYANOCOBALAMIN) 100 MCG tablet Take 100 mcg by mouth daily.       Assessment: 68 y.o. male admitted with CHF/NSTEMI for heparin  2/21@ 1210: HL is 0.29, just slightly subtherapeutic at 900 units/hr 2/21@ 1939: HL 0.7, therapeutic but at the top of goal at 1050 units/hr  Goal of Therapy:  Heparin level 0.3-0.7 units/ml Monitor platelets by anticoagulation protocol: Yes   Plan:  Decrease heparin infusion rate to 1000 units/hr to prevent patient from going SUPRAtherapeutic Check heparin level in 8 hours  Monitor for S/S of bleeding CBC daily  Paulita Fujita, PharmD Clinical Pharmacist 07/15/2023,8:35 PM

## 2023-07-15 NOTE — Progress Notes (Signed)
*  PRELIMINARY RESULTS* Echocardiogram 2D Echocardiogram has been performed.  Stacey Drain 07/15/2023, 4:35 PM

## 2023-07-15 NOTE — Progress Notes (Signed)
 PHARMACY - ANTICOAGULATION CONSULT NOTE  Pharmacy Consult for Heparin Indication: chest pain/ACS  No Known Allergies  Patient Measurements: Height: 6' (182.9 cm) Weight: 72.1 kg (158 lb 15.2 oz) IBW/kg (Calculated) : 77.6 HEPARIN DW (KG): 72.1   Vital Signs: Temp: 99.2 F (37.3 C) (02/21 1135) Temp Source: Axillary (02/21 1135) BP: 182/98 (02/21 1307) Pulse Rate: 97 (02/21 1307)  Labs: Recent Labs    07/14/23 1910 07/14/23 2042 07/14/23 2220 07/15/23 0119 07/15/23 0305 07/15/23 1210  HGB 11.9* 10.0*  --  9.2*  --   --   HCT 35.0* 30.3*  --  28.1*  --   --   PLT  --  227  --  196  --   --   HEPARINUNFRC  --   --   --   --   --  0.29*  CREATININE 8.60* 8.14*  --  8.93*  8.96*  --   --   TROPONINIHS  --  225*   < > 907* 1,202* 1,456*   < > = values in this interval not displayed.    Estimated Creatinine Clearance: 8 mL/min (A) (by C-G formula based on SCr of 8.96 mg/dL (H)).   Medical History: Past Medical History:  Diagnosis Date   CHF (congestive heart failure) (HCC)    Chronic kidney disease    Diabetes mellitus without complication (HCC)    Hypertension    Stroke (HCC)    2017    Medications:  No current facility-administered medications on file prior to encounter.   Current Outpatient Medications on File Prior to Encounter  Medication Sig Dispense Refill   amLODipine (NORVASC) 10 MG tablet Take 1 tablet (10 mg total) by mouth daily. 90 tablet 1   calcitRIOL (ROCALTROL) 0.5 MCG capsule Take 1 capsule (0.5 mcg total) by mouth daily. (Patient not taking: Reported on 03/27/2023) 30 capsule 1   calcium carbonate (TUMS - DOSED IN MG ELEMENTAL CALCIUM) 500 MG chewable tablet Chew 2 tablets by mouth every dialysis for indigestion or heartburn.     FEROSUL 325 (65 Fe) MG tablet Take 325 mg by mouth daily with breakfast.     furosemide (LASIX) 80 MG tablet Take 1 tablet (80 mg total) by mouth daily. 90 tablet 1   hydrALAZINE (APRESOLINE) 100 MG tablet Take 1  tablet (100 mg total) by mouth 3 (three) times daily. 90 tablet 0   labetalol (NORMODYNE) 200 MG tablet Take 2 tablets (400 mg total) by mouth 2 (two) times daily. 90 tablet 0   lidocaine-prilocaine (EMLA) cream Apply 1 Application topically.     magnesium gluconate (MAGONATE) 500 MG tablet Take 500 mg by mouth 2 (two) times daily.     vitamin B-12 (CYANOCOBALAMIN) 100 MCG tablet Take 100 mcg by mouth daily.       Assessment: 68 y.o. male admitted with CHF/NSTEMI for heparin  HL is 0.29, just slightly subtherapeutic at 900 units/hr  Goal of Therapy:  Heparin level 0.3-0.7 units/ml Monitor platelets by anticoagulation protocol: Yes   Plan:  Heparin 1000 units IV bolus, then increase heparin 1050 units/hr Check heparin level in 8 hours  Monitor for S/S of bleeding CBC daily  Elder Cyphers, BS Pharm D, BCPS Clinical Pharmacist 07/15/2023,1:19 PM

## 2023-07-15 NOTE — Progress Notes (Signed)
 Approximately [phone] patient transferred from ER to ICU#1 on BiPAP without incident.

## 2023-07-15 NOTE — Progress Notes (Signed)
 Date and time results received: 07/15/23 1308 (use smartphrase ".now" to insert current time)  Test: troponin Critical Value: 1456  Name of Provider Notified: Dr. Sherryll Burger  Orders Received? Or Actions Taken?: MD and primary nurse notified. Awaiting orders.

## 2023-07-16 DIAGNOSIS — I5043 Acute on chronic combined systolic (congestive) and diastolic (congestive) heart failure: Secondary | ICD-10-CM | POA: Diagnosis not present

## 2023-07-16 LAB — BASIC METABOLIC PANEL
Anion gap: 12 (ref 5–15)
BUN: 47 mg/dL — ABNORMAL HIGH (ref 8–23)
CO2: 25 mmol/L (ref 22–32)
Calcium: 7.6 mg/dL — ABNORMAL LOW (ref 8.9–10.3)
Chloride: 96 mmol/L — ABNORMAL LOW (ref 98–111)
Creatinine, Ser: 7.34 mg/dL — ABNORMAL HIGH (ref 0.61–1.24)
GFR, Estimated: 7 mL/min — ABNORMAL LOW (ref >60)
Glucose, Bld: 103 mg/dL — ABNORMAL HIGH (ref 70–99)
Potassium: 3.5 mmol/L (ref 3.5–5.1)
Sodium: 133 mmol/L — ABNORMAL LOW (ref 135–145)

## 2023-07-16 LAB — LIPID PANEL
Cholesterol: 106 mg/dL (ref 0–200)
HDL: 43 mg/dL (ref >40)
LDL Cholesterol: 46 mg/dL (ref 0–99)
Total CHOL/HDL Ratio: 2.5 ratio
Triglycerides: 86 mg/dL (ref <150)
VLDL: 17 mg/dL (ref 0–40)

## 2023-07-16 LAB — TROPONIN I (HIGH SENSITIVITY)
Troponin I (High Sensitivity): 1255 ng/L (ref <18)
Troponin I (High Sensitivity): 1170 ng/L (ref <18)
Troponin I (High Sensitivity): 954 ng/L (ref <18)
Troponin I (High Sensitivity): 962 ng/L (ref <18)

## 2023-07-16 LAB — CBC
HCT: 24.5 % — ABNORMAL LOW (ref 39.0–52.0)
Hemoglobin: 7.9 g/dL — ABNORMAL LOW (ref 13.0–17.0)
MCH: 30.9 pg (ref 26.0–34.0)
MCHC: 32.2 g/dL (ref 30.0–36.0)
MCV: 95.7 fL (ref 80.0–100.0)
Platelets: 208 10*3/uL (ref 150–400)
RBC: 2.56 MIL/uL — ABNORMAL LOW (ref 4.22–5.81)
RDW: 14.6 % (ref 11.5–15.5)
WBC: 5.2 10*3/uL (ref 4.0–10.5)
nRBC: 0 % (ref 0.0–0.2)

## 2023-07-16 LAB — HEPATITIS B SURFACE ANTIBODY, QUANTITATIVE: Hep B S AB Quant (Post): <3.5 m[IU]/mL — ABNORMAL LOW

## 2023-07-16 LAB — HEPARIN LEVEL (UNFRACTIONATED)
Heparin Unfractionated: 0.3 [IU]/mL (ref 0.30–0.70)
Heparin Unfractionated: 0.47 [IU]/mL (ref 0.30–0.70)

## 2023-07-16 LAB — MAGNESIUM: Magnesium: 2 mg/dL (ref 1.7–2.4)

## 2023-07-16 MED ORDER — PHENOL 1.4 % MT LIQD
1.0000 | OROMUCOSAL | Status: DC | PRN
Start: 2023-07-16 — End: 2023-07-21
  Administered 2023-07-16: 1 via OROMUCOSAL
  Filled 2023-07-16: qty 177

## 2023-07-16 MED ORDER — HYDRALAZINE HCL 20 MG/ML IJ SOLN
10.0000 mg | INTRAMUSCULAR | Status: DC | PRN
  Administered 2023-07-16 – 2023-07-20 (×4): 10 mg via INTRAVENOUS
  Filled 2023-07-16 (×4): qty 1

## 2023-07-16 MED ORDER — LABETALOL HCL 200 MG PO TABS
400.0000 mg | ORAL_TABLET | Freq: Two times a day (BID) | ORAL | Status: DC
  Administered 2023-07-16 – 2023-07-21 (×11): 400 mg via ORAL
  Filled 2023-07-16 (×11): qty 2

## 2023-07-16 MED ORDER — GUAIFENESIN-DM 100-10 MG/5ML PO SYRP: 5.0000 mL | ORAL_SOLUTION | ORAL | Status: DC | PRN

## 2023-07-16 NOTE — Progress Notes (Signed)
 PHARMACY - ANTICOAGULATION CONSULT NOTE  Pharmacy Consult for Heparin Indication: chest pain/ACS  No Known Allergies  Patient Measurements: Height: 6' (182.9 cm) Weight: 72.1 kg (158 lb 15.2 oz) IBW/kg (Calculated) : 77.6 HEPARIN DW (KG): 72.1   Vital Signs: Temp: 98.5 F (36.9 C) (02/22 0800) Temp Source: Oral (02/22 0800) BP: 176/77 (02/22 0939) Pulse Rate: 107 (02/22 0939)  Labs: Recent Labs    07/14/23 2042 07/14/23 2220 07/15/23 0119 07/15/23 0305 07/15/23 1210 07/15/23 1939 07/16/23 0501 07/16/23 0931  HGB 10.0*  --  9.2*  --   --   --  7.9*  --   HCT 30.3*  --  28.1*  --   --   --  24.5*  --   PLT 227  --  196  --   --   --  208  --   HEPARINUNFRC  --   --   --   --  0.29* 0.70 0.30  --   CREATININE 8.14*  --  8.93*  8.96*  --   --   --  7.34*  --   TROPONINIHS 225*   < > 907*   < > 1,456*  --  1,255* 1,170*   < > = values in this interval not displayed.    Estimated Creatinine Clearance: 9.8 mL/min (A) (by C-G formula based on SCr of 7.34 mg/dL (H)).   Medical History: Past Medical History:  Diagnosis Date   CHF (congestive heart failure) (HCC)    Chronic kidney disease    Diabetes mellitus without complication (HCC)    Hypertension    Stroke (HCC)    2017    Medications:  No current facility-administered medications on file prior to encounter.   Current Outpatient Medications on File Prior to Encounter  Medication Sig Dispense Refill   amLODipine (NORVASC) 10 MG tablet Take 1 tablet (10 mg total) by mouth daily. 90 tablet 1   calcitRIOL (ROCALTROL) 0.5 MCG capsule Take 1 capsule (0.5 mcg total) by mouth daily. (Patient not taking: Reported on 03/27/2023) 30 capsule 1   calcium carbonate (TUMS - DOSED IN MG ELEMENTAL CALCIUM) 500 MG chewable tablet Chew 2 tablets by mouth every dialysis for indigestion or heartburn.     FEROSUL 325 (65 Fe) MG tablet Take 325 mg by mouth daily with breakfast.     furosemide (LASIX) 80 MG tablet Take 1 tablet (80  mg total) by mouth daily. 90 tablet 1   hydrALAZINE (APRESOLINE) 100 MG tablet Take 1 tablet (100 mg total) by mouth 3 (three) times daily. 90 tablet 0   labetalol (NORMODYNE) 200 MG tablet Take 2 tablets (400 mg total) by mouth 2 (two) times daily. 90 tablet 0   lidocaine-prilocaine (EMLA) cream Apply 1 Application topically.     magnesium gluconate (MAGONATE) 500 MG tablet Take 500 mg by mouth 2 (two) times daily.     vitamin B-12 (CYANOCOBALAMIN) 100 MCG tablet Take 100 mcg by mouth daily.       Assessment: 68 y.o. male admitted with CHF/NSTEMI for heparin Troponins 1456> 1255> 1170, trending down. F/u ECHO  HL is 0.29> 0.7> 0.3, therapeutic at 1000 units/hr but on the low end will increase slightly   Goal of Therapy:  Heparin level 0.3-0.7 units/ml Monitor platelets by anticoagulation protocol: Yes   Plan:  Increase heparin infusion to 1050 units/hr Check heparin level in 8 hours and daily Monitor for S/S of bleeding CBC daily  Elder Cyphers, BS Loura Back, BCPS Clinical Pharmacist Pager #  [phone]  07/16/2023,10:38 AM

## 2023-07-16 NOTE — Progress Notes (Signed)
 PROGRESS NOTE    Matthew Robles  ZOX:[id number] DOB: [birth year] DOA: 07/14/2023 PCP: Elmer Picker, [facility]   Brief Narrative:    Matthew Robles is a 68 y.o. male with medical history significant of hypertension, end-stage renal disease on hemodialysis (MWF), type 2 diabetes mellitus, history of stroke who presents to the emergency department from home via EMS due to increased work of breathing which started about 1 hour prior to arrival to the ED.  EMS was activated and on arrival of EMS team, O2 sat was noted to be 61% on home oxygen (patient states that he uses supplemental oxygen only as needed at home).  He was placed on NRB with O2 sat increasing to 73%.  O2 sat increased to 91% on CPAP per report.  Patient was admitted with acute hypoxemic respiratory failure secondary to acute on chronic HFmrEF with hypertensive emergency and is also noted to have elevated troponin levels.  He is a dialysis patient and nephrology plans to hemodialyzed and he has been started on nitroglycerin drip.  He is also noted to be positive for influenza A.  Assessment & Plan:   Principal Problem:   Acute on chronic combined systolic and diastolic CHF (congestive heart failure) (HCC) Active Problems:   ESRD on hemodialysis (HCC)   Acute respiratory failure with hypoxia (HCC)   Hypoalbuminemia due to protein-calorie malnutrition (HCC)   Elevated troponin   Essential hypertension   CAP (community acquired pneumonia)   Hypocalcemia   Type 2 diabetes mellitus with hyperglycemia (HCC)  Assessment and Plan:   Acute hypoxemic respiratory failure secondary to acute on chronic combined systolic and diastolic CHF Chest x-ray was suggestive of pulmonary edema BNP was 3,242 (this was 2781 on 03/27/2023) Continue total input/output, daily weights and fluid restriction Currently weaned off of BiPAP and now on nasal cannula Continue heart healthy diet      Echocardiogram done in November 2024 showed  LVEF of 45 to 50%.  LV demonstrates global hypokinesis.  Moderate LVH.  G3 DD.  Echocardiogram ordered and pending This is possibly due to patient's fluid overload.  Continue BiPAP at this time with plan to wean patient off this as tolerated as some level based on Continue hemodialysis  Hypertensive emergency-improving Continue Norvasc 10 mg daily and hydralazine 100 mg 3 times daily Resume home labetalol today Started on nitroglycerin drip per cardiology and wean as tolerated   Elevated troponin possibly due to type II demand ischemia Troponin trend decreasing, continue to monitor Patient was started on heparin drip, continue another 24 hours Patient denies chest pain 2D echocardiogram 2/21 without any wall motion abnormalities and preserved LVEF   ESRD on HD (MWF) Nephrology following and patient has had HD 2/21   Presumed CAP POA along with influenza A Chest x-ray was suggestive of pneumonia Patient was started on ceftriaxone and azithromycin, we shall continue same at this time with plan to de-escalate/discontinue based on blood culture, sputum culture, urine Legionella, strep pneumo and procalcitonin elevated at 15.82 Tamiflu started Continue Tylenol as needed Continue Mucinex, incentive spirometry, flutter valve    Hypocalcemia Continue to monitor Continue Os-Cal  Worsening anemia Monitor CBC while on heparin drip No overt bleeding noted   Hypoalbuminemia possible secondary to mild protein calorie malnutrition Albumin 3.2, protein supplement will be provided   Essential hypertension Continue amlodipine, hydralazine, labetalol Continue IV hydralazine 10 mg every 6 hours as needed for SBP > 170   Type 2 diabetes mellitus with hyperglycemia Hemoglobin A1c at 5.8 Continue  ISS and hypoglycemia protocol  DVT prophylaxis: Heparin drip Code Status: Full Family Communication: None at bedside Disposition Plan:  Status is: Inpatient Remains inpatient appropriate because:  Need for IV medications  Consultants:  Cardiology Nephrology  Procedures:  None  Antimicrobials:  Anti-infectives (From admission, onward)    Start     Dose/Rate Route Frequency Ordered Stop   07/15/23 1230  oseltamivir (TAMIFLU) capsule 30 mg        30 mg Oral Every M-W-F (Hemodialysis) 07/15/23 1140 07/27/23 1159   07/15/23 1200  oseltamivir (TAMIFLU) capsule 30 mg  Status:  Discontinued        30 mg Oral Every other day 07/15/23 1110 07/15/23 1116   07/15/23 0300  cefTRIAXone (ROCEPHIN) 1 g in sodium chloride 0.9 % 100 mL IVPB        1 g 200 mL/hr over 30 Minutes Intravenous Every 24 hours 07/15/23 0256     07/15/23 0300  azithromycin (ZITHROMAX) 500 mg in sodium chloride 0.9 % 250 mL IVPB        500 mg 250 mL/hr over 60 Minutes Intravenous Every 24 hours 07/15/23 0256         Subjective: Patient seen and evaluated today with no new acute complaints or concerns. No acute concerns or events noted overnight.  He denies any current chest pain or shortness of breath.  Blood pressures remain elevated and patient still on nitroglycerin drip.  Troponin downward trending.  He has tolerated hemodialysis well yesterday.  Objective: Vitals:   07/16/23 0936 07/16/23 0939 07/16/23 1000 07/16/23 1030  BP: (!) 176/77 (!) 176/77  (!) 174/63  Pulse:  (!) 107  93  Resp:   18 (!) 24  Temp:      TempSrc:      SpO2:   90%   Weight:      Height:        Intake/Output Summary (Last 24 hours) at 07/16/2023 1049 Last data filed at 07/16/2023 0800 Gross per 24 hour  Intake 1226.26 ml  Output 2800 ml  Net -1573.74 ml   Filed Weights   07/14/23 1902  Weight: 72.1 kg    Examination:  General exam: Appears calm and comfortable  Respiratory system: Diminished to auscultation bilaterally. Respiratory effort normal.  5 L nasal cannula Cardiovascular system: S1 & S2 heard, RRR.  Gastrointestinal system: Abdomen is soft Central nervous system: Alert and awake Extremities: No edema Skin:  No significant lesions noted Psychiatry: Flat affect.    Data Reviewed: I have personally reviewed following labs and imaging studies  CBC: Recent Labs  Lab 07/14/23 1910 07/14/23 2042 07/15/23 0119 07/16/23 0501  WBC  --  13.4* 10.0 5.2  NEUTROABS  --  12.3*  --   --   HGB 11.9* 10.0* 9.2* 7.9*  HCT 35.0* 30.3* 28.1* 24.5*  MCV  --  96.8 96.2 95.7  PLT  --  227 196 208   Basic Metabolic Panel: Recent Labs  Lab 07/14/23 1910 07/14/23 2042 07/15/23 0119 07/15/23 0959 07/16/23 0501  NA 136 133* 136  137  --  133*  K 4.1 4.2 4.1  4.2  --  3.5  CL 100 99 100  99  --  96*  CO2  --  19* 22  22  --  25  GLUCOSE 195* 174* 126*  127*  --  103*  BUN 46* 53* 59*  58*  --  47*  CREATININE 8.60* 8.14* 8.93*  8.96*  --  7.34*  CALCIUM  --  7.7* 7.8*  7.9*  --  7.6*  MG  --   --  2.1  --  2.0  PHOS  --   --  4.6  4.5 5.1*  --    GFR: Estimated Creatinine Clearance: 9.8 mL/min (A) (by C-G formula based on SCr of 7.34 mg/dL (H)). Liver Function Tests: Recent Labs  Lab 07/14/23 2042 07/15/23 0119  AST 21 17  ALT 13 12  ALKPHOS 55 50  BILITOT 1.1 1.1  PROT 7.6 7.0  ALBUMIN 3.2* 2.9*  2.9*   No results for input(s): "LIPASE", "AMYLASE" in the last 168 hours. No results for input(s): "AMMONIA" in the last 168 hours. Coagulation Profile: No results for input(s): "INR", "PROTIME" in the last 168 hours. Cardiac Enzymes: No results for input(s): "CKTOTAL", "CKMB", "CKMBINDEX", "TROPONINI" in the last 168 hours. BNP (last 3 results) No results for input(s): "PROBNP" in the last 8760 hours. HbA1C: No results for input(s): "HGBA1C" in the last 72 hours. CBG: Recent Labs  Lab 07/14/23 2123 07/15/23 1946  GLUCAP 165* 100*   Lipid Profile: Recent Labs    07/16/23 0501  CHOL 106  HDL 43  LDLCALC 46  TRIG 86  CHOLHDL 2.5   Thyroid Function Tests: No results for input(s): "TSH", "T4TOTAL", "FREET4", "T3FREE", "THYROIDAB" in the last 72 hours. Anemia  Panel: No results for input(s): "VITAMINB12", "FOLATE", "FERRITIN", "TIBC", "IRON", "RETICCTPCT" in the last 72 hours. Sepsis Labs: Recent Labs  Lab 07/15/23 0119  PROCALCITON 15.82    Recent Results (from the past 240 hours)  Culture, blood (Routine X 2) w Reflex to ID Panel     Status: None (Preliminary result)   Collection Time: 07/15/23  3:03 AM   Specimen: BLOOD  Result Value Ref Range Status   Specimen Description BLOOD BLOOD RIGHT ARM  Final   Special Requests   Final    BOTTLES DRAWN AEROBIC AND ANAEROBIC Blood Culture adequate volume   Culture   Final    NO GROWTH 1 DAY Performed at [facility], [address] [place], [place] [address]    Report Status PENDING  Incomplete  Culture, blood (Routine X 2) w Reflex to ID Panel     Status: None (Preliminary result)   Collection Time: 07/15/23  3:05 AM   Specimen: BLOOD  Result Value Ref Range Status   Specimen Description BLOOD BLOOD RIGHT HAND  Final   Special Requests   Final    BOTTLES DRAWN AEROBIC AND ANAEROBIC Blood Culture adequate volume   Culture   Final    NO GROWTH 1 DAY Performed at [facility], [address] [place], [place] [address]    Report Status PENDING  Incomplete  MRSA Next Gen by PCR, Nasal     Status: None   Collection Time: 07/15/23  8:40 AM   Specimen: Nasal Mucosa; Nasal Swab  Result Value Ref Range Status   MRSA by PCR Next Gen NOT DETECTED NOT DETECTED Final    Comment: (NOTE) The GeneXpert MRSA Assay (FDA approved for NASAL specimens only), is one component of a comprehensive MRSA colonization surveillance program. It is not intended to diagnose MRSA infection nor to guide or monitor treatment for MRSA infections. Test performance is not FDA approved in patients less than 1 years old. Performed at [facility], [address], [place], [place] [address]   Resp panel by RT-PCR (RSV, Flu A&B, Covid) Anterior Nasal Swab     Status: Abnormal   Collection Time: 07/15/23  9:21 AM  Specimen: Anterior Nasal Swab  Result Value Ref Range Status   SARS Coronavirus 2 by RT PCR NEGATIVE NEGATIVE Final    Comment: (NOTE) SARS-CoV-2 target nucleic acids are NOT DETECTED.  The SARS-CoV-2 RNA is generally detectable in upper respiratory specimens during the acute phase of infection. The lowest concentration of SARS-CoV-2 viral copies this assay can detect is 138 copies/mL. A negative result does not preclude SARS-Cov-2 infection and should not be used as the sole basis for treatment or other patient management decisions. A negative result may occur with  improper specimen collection/handling, submission of specimen other than nasopharyngeal swab, presence of viral mutation(s) within the areas targeted by this assay, and inadequate number of viral copies(<138 copies/mL). A negative result must be combined with clinical observations, patient history, and epidemiological information. The expected result is Negative.  Fact Sheet for Patients:  BloggerCourse.com  Fact Sheet for Healthcare Providers:  SeriousBroker.it  This test is no t yet approved or cleared by the [place] FDA and  has been authorized for detection and/or diagnosis of SARS-CoV-2 by FDA under an Emergency Use Authorization (EUA). This EUA will remain  in effect (meaning this test can be used) for the duration of the COVID-19 declaration under Section 564(b)(1) of the Act, 21 U.S.C.section 360bbb-3(b)(1), unless the authorization is terminated  or revoked sooner.       Influenza A by PCR POSITIVE (A) NEGATIVE Final   Influenza B by PCR NEGATIVE NEGATIVE Final    Comment: (NOTE) The Xpert Xpress SARS-CoV-2/FLU/RSV plus assay is intended as an aid in the diagnosis of influenza from Nasopharyngeal swab specimens and should not be used as a sole basis for treatment. Nasal washings and aspirates are unacceptable for Xpert Xpress  SARS-CoV-2/FLU/RSV testing.  Fact Sheet for Patients: BloggerCourse.com  Fact Sheet for Healthcare Providers: SeriousBroker.it  This test is not yet approved or cleared by the [place] FDA and has been authorized for detection and/or diagnosis of SARS-CoV-2 by FDA under an Emergency Use Authorization (EUA). This EUA will remain in effect (meaning this test can be used) for the duration of the COVID-19 declaration under Section 564(b)(1) of the Act, 21 U.S.C. section 360bbb-3(b)(1), unless the authorization is terminated or revoked.     Resp Syncytial Virus by PCR NEGATIVE NEGATIVE Final    Comment: (NOTE) Fact Sheet for Patients: BloggerCourse.com  Fact Sheet for Healthcare Providers: SeriousBroker.it  This test is not yet approved or cleared by the [place] FDA and has been authorized for detection and/or diagnosis of SARS-CoV-2 by FDA under an Emergency Use Authorization (EUA). This EUA will remain in effect (meaning this test can be used) for the duration of the COVID-19 declaration under Section 564(b)(1) of the Act, 21 U.S.C. section 360bbb-3(b)(1), unless the authorization is terminated or revoked.  Performed at [facility], [address], [place], [place] [address]          Radiology Studies: ECHOCARDIOGRAM COMPLETE Result Date: 07/15/2023    ECHOCARDIOGRAM REPORT   Patient Name:   Matthew Robles Date of Exam: 07/15/2023 Medical Rec #:  [id number]       Height:       72.0 in Accession #:    [id number]      Weight:       159.0 lb Date of Birth:  May 16, 1956       BSA:          1.932 m Patient Age:    69 years  BP:           165/68 mmHg Patient Gender: M               HR:           87 bpm. [facility] Procedure: 2D Echo, Cardiac Doppler and Color Doppler (Both Spectral and Color            Flow Doppler were utilized during procedure).  Indications:    CHF-Acute Systolic I50.21  History:        Patient has prior history of Echocardiogram examinations, most                 recent 03/29/2023. CHF; Risk Factors:Hypertension, Diabetes and                 Dyslipidemia.  Sonographer:    Celesta Gentile RCS Referring Phys: [id number] OLADAPO ADEFESO IMPRESSIONS  1. Left ventricular ejection fraction, by estimation, is 60 to 65%. The left ventricle has normal function. The left ventricle has no regional wall motion abnormalities. There is moderate left ventricular hypertrophy. Left ventricular diastolic parameters are indeterminate. Elevated left atrial pressure.  2. Right ventricular systolic function is normal. The right ventricular size is normal. Tricuspid regurgitation signal is inadequate for assessing PA pressure.  3. Left atrial size was severely dilated.  4. Right atrial size was severely dilated.  5. The mitral valve is normal in structure. No evidence of mitral valve regurgitation. No evidence of mitral stenosis.  6. The aortic valve is tricuspid. There is mild calcification of the aortic valve. There is mild thickening of the aortic valve. Aortic valve regurgitation is not visualized. No aortic stenosis is present.  7. The inferior vena cava is normal in size with greater than 50% respiratory variability, suggesting right atrial pressure of 3 mmHg. FINDINGS  Left Ventricle: Left ventricular ejection fraction, by estimation, is 60 to 65%. The left ventricle has normal function. The left ventricle has no regional wall motion abnormalities. Strain imaging was not performed. The left ventricular internal cavity  size was normal in size. There is moderate left ventricular hypertrophy. Left ventricular diastolic parameters are indeterminate. Elevated left atrial pressure. Right Ventricle: The right ventricular size is normal. Right vetricular wall thickness was not well visualized. Right ventricular systolic function is normal. Tricuspid regurgitation  signal is inadequate for assessing PA pressure. Left Atrium: Left atrial size was severely dilated. Right Atrium: Right atrial size was severely dilated. Pericardium: There is no evidence of pericardial effusion. Mitral Valve: The mitral valve is normal in structure. No evidence of mitral valve regurgitation. No evidence of mitral valve stenosis. Tricuspid Valve: The tricuspid valve is normal in structure. Tricuspid valve regurgitation is not demonstrated. No evidence of tricuspid stenosis. Aortic Valve: The aortic valve is tricuspid. There is mild calcification of the aortic valve. There is mild thickening of the aortic valve. There is mild aortic valve annular calcification. Aortic valve regurgitation is not visualized. No aortic stenosis  is present. Aortic valve mean gradient measures 3.5 mmHg. Aortic valve peak gradient measures 6.3 mmHg. Aortic valve area, by VTI measures 2.90 cm. Pulmonic Valve: The pulmonic valve was not well visualized. Pulmonic valve regurgitation is not visualized. No evidence of pulmonic stenosis. Aorta: The aortic root is normal in size and structure. Venous: The inferior vena cava is normal in size with greater than 50% respiratory variability, suggesting right atrial pressure of 3 mmHg. IAS/Shunts: No atrial level shunt detected by color flow Doppler. Additional  Comments: 3D imaging was not performed.  LEFT VENTRICLE PLAX 2D LVIDd:         5.20 cm   Diastology LVIDs:         3.50 cm   LV e' medial:    5.33 cm/s LV PW:         1.20 cm   LV E/e' medial:  19.3 LV IVS:        1.30 cm   LV e' lateral:   5.55 cm/s LVOT diam:     2.20 cm   LV E/e' lateral: 18.6 LV SV:         63 LV SV Index:   32 LVOT Area:     3.80 cm  RIGHT VENTRICLE RV S prime:     19.60 cm/s TAPSE (M-mode): 2.2 cm LEFT ATRIUM              Index        RIGHT ATRIUM           Index LA diam:        4.90 cm  2.54 cm/m   RA Area:     28.30 cm LA Vol (A2C):   112.0 ml 57.96 ml/m  RA Volume:   94.10 ml  48.70 ml/m LA Vol  (A4C):   169.0 ml 87.46 ml/m LA Biplane Vol: 142.0 ml 73.48 ml/m  AORTIC VALVE AV Area (Vmax):    2.73 cm AV Area (Vmean):   2.79 cm AV Area (VTI):     2.90 cm AV Vmax:           125.47 cm/s AV Vmean:          87.901 cm/s AV VTI:            0.217 m AV Peak Grad:      6.3 mmHg AV Mean Grad:      3.5 mmHg LVOT Vmax:         90.10 cm/s LVOT Vmean:        64.600 cm/s LVOT VTI:          0.165 m LVOT/AV VTI ratio: 0.76  AORTA Ao Root diam: 3.40 cm MITRAL VALVE MV Area (PHT): 5.54 cm     SHUNTS MV Decel Time: 137 msec     Systemic VTI:  0.16 m MV E velocity: 103.00 cm/s  Systemic Diam: 2.20 cm MV A velocity: 90.00 cm/s MV E/A ratio:  1.14 Dina Rich MD Electronically signed by Dina Rich MD Signature Date/Time: 07/15/2023/5:22:47 PM    Final    DG Chest Port 1 View Result Date: 07/14/2023 CLINICAL DATA:  Shortness of breath.  History of CHF. EXAM: PORTABLE CHEST 1 VIEW COMPARISON:  03/27/2023. FINDINGS: Redemonstration of moderate increased interstitial markings throughout bilateral lungs with bilateral hilar and lower lung zone predominance. There is superimposed heterogeneous opacity overlying the right lower lung zone partially obscuring the right lateral hemidiaphragm, concerning for pneumonia. Follow-up to clearing is recommended. Bilateral lung fields are otherwise clear. Left lateral costophrenic angle is clear. Stable cardio-mediastinal silhouette. No acute osseous abnormalities. The soft tissues are within normal limits. IMPRESSION: *Findings favor congestive heart failure/pulmonary edema. *There is superimposed heterogeneous opacity overlying the right lower lung zone partially obscuring the right lateral hemidiaphragm, concerning for pneumonia. Follow-up to clearing is recommended. Electronically Signed   By: Jules Schick M.D.   On: 07/14/2023 19:26        Scheduled Meds:  amLODipine  10 mg Oral Daily   aspirin EC  81 mg Oral Daily   calcium carbonate  1 tablet Oral Q breakfast    Chlorhexidine Gluconate Cloth  6 each Topical Q0600   dextromethorphan-guaiFENesin  1 tablet Oral BID   feeding supplement (GLUCERNA SHAKE)  237 mL Oral TID BM   hydrALAZINE  100 mg Oral TID   labetalol  400 mg Oral BID   oseltamivir  30 mg Oral Q M,W,F-HD   rosuvastatin  10 mg Oral Daily   Continuous Infusions:  azithromycin Stopped (07/16/23 0432)   cefTRIAXone (ROCEPHIN)  IV 1 g (07/16/23 0226)   heparin 1,000 Units/hr (07/16/23 0716)   nitroGLYCERIN 140 mcg/min (07/16/23 0912)     LOS: 2 days    Critical care time spent: 55 minutes    Dina Warbington D Sherryll Burger, DO [facility]  If 7PM-7AM, please contact night-coverage [email or web address] 07/16/2023, 10:49 AM

## 2023-07-16 NOTE — Plan of Care (Signed)

## 2023-07-16 NOTE — Progress Notes (Signed)
 Critical lab troponin called of 954 notified Dr. Sherryll Burger

## 2023-07-16 NOTE — Progress Notes (Signed)
 Date and time results received: 07/16/23 0905 (use smartphrase ".now" to insert current time)  Test: troponin Critical Value: 1255  Name of Provider Notified: Dr. Sherryll Burger  Orders Received? Or Actions Taken?: MD notified, awaiting orders

## 2023-07-16 NOTE — Progress Notes (Signed)
 Date and time results received: 07/16/23 1007 (use smartphrase ".now" to insert current time)  Test: troponin Critical Value: 1170  Name of Provider Notified: Dr. Sherryll Burger  Orders Received? Or Actions Taken?: MD notified. Troponin still trending down. Continue course.

## 2023-07-16 NOTE — Progress Notes (Signed)
 PHARMACY - ANTICOAGULATION CONSULT NOTE  Pharmacy Consult for Heparin Indication: chest pain/ACS  No Known Allergies  Patient Measurements: Height: 6' (182.9 cm) Weight: 72.1 kg (158 lb 15.2 oz) IBW/kg (Calculated) : 77.6 HEPARIN DW (KG): 72.1   Vital Signs: Temp: 98.7 F (37.1 C) (02/22 1500) Temp Source: Oral (02/22 1500) BP: 172/73 (02/22 1802) Pulse Rate: 92 (02/22 1800)  Labs: Recent Labs    07/14/23 2042 07/14/23 2220 07/15/23 0119 07/15/23 0305 07/15/23 1210 07/15/23 1939 07/16/23 0501 07/16/23 0931 07/16/23 1521  HGB 10.0*  --  9.2*  --   --   --  7.9*  --   --   HCT 30.3*  --  28.1*  --   --   --  24.5*  --   --   PLT 227  --  196  --   --   --  208  --   --   HEPARINUNFRC  --   --   --   --  0.29* 0.70 0.30  --   --   CREATININE 8.14*  --  8.93*  8.96*  --   --   --  7.34*  --   --   TROPONINIHS 225*   < > 907*   < > 1,456*  --  1,255* 1,170* 954*   < > = values in this interval not displayed.    Estimated Creatinine Clearance: 9.8 mL/min (A) (by C-G formula based on SCr of 7.34 mg/dL (H)).   Medical History: Past Medical History:  Diagnosis Date   CHF (congestive heart failure) (HCC)    Chronic kidney disease    Diabetes mellitus without complication (HCC)    Hypertension    Stroke (HCC)    2017    Medications:  No current facility-administered medications on file prior to encounter.   Current Outpatient Medications on File Prior to Encounter  Medication Sig Dispense Refill   amLODipine (NORVASC) 10 MG tablet Take 1 tablet (10 mg total) by mouth daily. 90 tablet 1   calcitRIOL (ROCALTROL) 0.5 MCG capsule Take 1 capsule (0.5 mcg total) by mouth daily. (Patient not taking: Reported on 03/27/2023) 30 capsule 1   calcium carbonate (TUMS - DOSED IN MG ELEMENTAL CALCIUM) 500 MG chewable tablet Chew 2 tablets by mouth every dialysis for indigestion or heartburn.     FEROSUL 325 (65 Fe) MG tablet Take 325 mg by mouth daily with breakfast.      furosemide (LASIX) 80 MG tablet Take 1 tablet (80 mg total) by mouth daily. 90 tablet 1   hydrALAZINE (APRESOLINE) 100 MG tablet Take 1 tablet (100 mg total) by mouth 3 (three) times daily. 90 tablet 0   labetalol (NORMODYNE) 200 MG tablet Take 2 tablets (400 mg total) by mouth 2 (two) times daily. 90 tablet 0   lidocaine-prilocaine (EMLA) cream Apply 1 Application topically.     magnesium gluconate (MAGONATE) 500 MG tablet Take 500 mg by mouth 2 (two) times daily.     vitamin B-12 (CYANOCOBALAMIN) 100 MCG tablet Take 100 mcg by mouth daily.       Assessment: 68 y.o. male admitted with CHF/NSTEMI for heparin Troponins 1456> 1255> 1170, trending down. F/u ECHO  HL is 0.29> 0.7> 0.3>0.47, therapeutic at 1050 units/hr   Goal of Therapy:  Heparin level 0.3-0.7 units/ml Monitor platelets by anticoagulation protocol: Yes   Plan:  Heparin level therapeutic x 2, will check 1 more time given rate was adjusted with last check Continue heparin infusion at  1050 units/hr Check heparin level in 8 hours and daily Monitor for S/S of bleeding CBC daily  Paulita Fujita, PharmD Clinical Pharmacist   07/16/2023,6:53 PM

## 2023-07-17 DIAGNOSIS — I5043 Acute on chronic combined systolic (congestive) and diastolic (congestive) heart failure: Secondary | ICD-10-CM | POA: Diagnosis not present

## 2023-07-17 LAB — HEPARIN LEVEL (UNFRACTIONATED): Heparin Unfractionated: 0.47 [IU]/mL (ref 0.30–0.70)

## 2023-07-17 LAB — CBC
HCT: 22.4 % — ABNORMAL LOW (ref 39.0–52.0)
Hemoglobin: 7.5 g/dL — ABNORMAL LOW (ref 13.0–17.0)
MCH: 31.8 pg (ref 26.0–34.0)
MCHC: 33.5 g/dL (ref 30.0–36.0)
MCV: 94.9 fL (ref 80.0–100.0)
Platelets: 188 10*3/uL (ref 150–400)
RBC: 2.36 MIL/uL — ABNORMAL LOW (ref 4.22–5.81)
RDW: 14.5 % (ref 11.5–15.5)
WBC: 5.6 10*3/uL (ref 4.0–10.5)
nRBC: 0 % (ref 0.0–0.2)

## 2023-07-17 LAB — FOLATE: Folate: 9.5 ng/mL (ref >5.9)

## 2023-07-17 LAB — BASIC METABOLIC PANEL
Anion gap: 14 (ref 5–15)
BUN: 66 mg/dL — ABNORMAL HIGH (ref 8–23)
CO2: 23 mmol/L (ref 22–32)
Calcium: 7.2 mg/dL — ABNORMAL LOW (ref 8.9–10.3)
Chloride: 94 mmol/L — ABNORMAL LOW (ref 98–111)
Creatinine, Ser: 9.9 mg/dL — ABNORMAL HIGH (ref 0.61–1.24)
GFR, Estimated: 5 mL/min — ABNORMAL LOW (ref >60)
Glucose, Bld: 94 mg/dL (ref 70–99)
Potassium: 3.8 mmol/L (ref 3.5–5.1)
Sodium: 131 mmol/L — ABNORMAL LOW (ref 135–145)

## 2023-07-17 LAB — IRON AND TIBC
Iron: 47 ug/dL (ref 45–182)
Saturation Ratios: 37 % (ref 17.9–39.5)
TIBC: 126 ug/dL — ABNORMAL LOW (ref 250–450)
UIBC: 79 ug/dL

## 2023-07-17 LAB — TROPONIN I (HIGH SENSITIVITY)
Troponin I (High Sensitivity): 895 ng/L (ref <18)
Troponin I (High Sensitivity): 635 ng/L (ref <18)

## 2023-07-17 LAB — RETICULOCYTES
Immature Retic Fract: 7.2 % (ref 2.3–15.9)
RBC.: 2.39 MIL/uL — ABNORMAL LOW (ref 4.22–5.81)
Retic Count, Absolute: 17.9 10*3/uL — ABNORMAL LOW (ref 19.0–186.0)
Retic Ct Pct: 0.8 % (ref 0.4–3.1)

## 2023-07-17 LAB — MAGNESIUM: Magnesium: 2 mg/dL (ref 1.7–2.4)

## 2023-07-17 LAB — VITAMIN B12: Vitamin B-12: 1164 pg/mL — ABNORMAL HIGH (ref 180–914)

## 2023-07-17 MED ORDER — OXYMETAZOLINE HCL 0.05 % NA SOLN
1.0000 | Freq: Two times a day (BID) | NASAL | Status: AC
  Administered 2023-07-17 – 2023-07-19 (×6): 1 via NASAL
  Filled 2023-07-17: qty 30
  Filled 2023-07-17: qty 15

## 2023-07-17 MED ORDER — HYDRALAZINE HCL 25 MG PO TABS
200.0000 mg | ORAL_TABLET | Freq: Three times a day (TID) | ORAL | Status: DC
  Administered 2023-07-17 – 2023-07-21 (×11): 200 mg via ORAL
  Filled 2023-07-17: qty 4
  Filled 2023-07-17 (×2): qty 8
  Filled 2023-07-17: qty 4
  Filled 2023-07-17: qty 8
  Filled 2023-07-17 (×4): qty 4
  Filled 2023-07-17: qty 8
  Filled 2023-07-17 (×2): qty 4

## 2023-07-17 MED ORDER — SALINE SPRAY 0.65 % NA SOLN
1.0000 | NASAL | Status: DC | PRN
  Administered 2023-07-17 – 2023-07-20 (×3): 1 via NASAL
  Filled 2023-07-17: qty 44

## 2023-07-17 MED ORDER — DARBEPOETIN ALFA 100 MCG/0.5ML IJ SOSY
100.0000 ug | PREFILLED_SYRINGE | INTRAMUSCULAR | Status: DC
  Administered 2023-07-18: 100 ug via SUBCUTANEOUS
  Filled 2023-07-17: qty 0.5

## 2023-07-17 NOTE — Progress Notes (Signed)
 PROGRESS NOTE    Matthew Robles  WUJ:[id number] DOB: [birth year] DOA: 07/14/2023 PCP: Elmer Picker, [facility]   Brief Narrative:    Matthew Robles is a 68 y.o. male with medical history significant of hypertension, end-stage renal disease on hemodialysis (MWF), type 2 diabetes mellitus, history of stroke who presents to the emergency department from home via EMS due to increased work of breathing which started about 1 hour prior to arrival to the ED.  EMS was activated and on arrival of EMS team, O2 sat was noted to be 61% on home oxygen (patient states that he uses supplemental oxygen only as needed at home).  He was placed on NRB with O2 sat increasing to 73%.  O2 sat increased to 91% on CPAP per report.  Patient was admitted with acute hypoxemic respiratory failure secondary to acute on chronic HFmrEF with hypertensive emergency and is also noted to have elevated troponin levels.  He is a dialysis patient and nephrology plans to hemodialyzed and he has been started on nitroglycerin drip.  He is also noted to be positive for influenza A.  Assessment & Plan:   Principal Problem:   Acute on chronic combined systolic and diastolic CHF (congestive heart failure) (HCC) Active Problems:   ESRD on hemodialysis (HCC)   Acute respiratory failure with hypoxia (HCC)   Hypoalbuminemia due to protein-calorie malnutrition (HCC)   Elevated troponin   Essential hypertension   CAP (community acquired pneumonia)   Hypocalcemia   Type 2 diabetes mellitus with hyperglycemia (HCC)  Assessment and Plan:   Acute hypoxemic respiratory failure secondary to acute on chronic combined systolic and diastolic CHF Chest x-ray was suggestive of pulmonary edema BNP was 3,242 (this was 2781 on 03/27/2023) Continue total input/output, daily weights and fluid restriction Currently weaned off of BiPAP and now on nasal cannula Continue heart healthy diet      Echocardiogram done in November 2024 showed  LVEF of 45 to 50%.  LV demonstrates global hypokinesis.  Moderate LVH.  G3 DD.  Echocardiogram ordered and pending This is possibly due to patient's fluid overload.  Continue BiPAP at this time with plan to wean patient off this as tolerated as some level based on Continue hemodialysis  Hypertensive emergency-improving Continue Norvasc 10 mg daily and hydralazine increased to 200 mg 3 times daily Resumed home labetalol 2/22 IV hydralazine to assist with weaning Started on nitroglycerin drip per cardiology and wean as tolerated   Elevated troponin possibly due to type II demand ischemia Troponin trend decreasing, continue to monitor Patient was started on heparin drip, continue another 24 hours Patient denies chest pain 2D echocardiogram 2/21 without any wall motion abnormalities and preserved LVEF   ESRD on HD (MWF) Nephrology following and patient has had HD 2/21   Presumed CAP POA along with influenza A Chest x-ray was suggestive of pneumonia Patient was started on ceftriaxone and azithromycin, we shall continue same at this time with plan to de-escalate/discontinue based on blood culture, sputum culture, urine Legionella, strep pneumo and procalcitonin elevated at 15.82 Tamiflu started Continue Tylenol as needed Continue Mucinex, incentive spirometry, flutter valve    Hypocalcemia Continue to monitor Continue Os-Cal  Worsening anemia Monitor CBC while on heparin drip No overt bleeding noted Check stool occult Anemia panel within normal limits Nephrology to give ESA tomorrow   Hypoalbuminemia possible secondary to mild protein calorie malnutrition Albumin 3.2, protein supplement will be provided   Essential hypertension Continue amlodipine, hydralazine, labetalol Continue IV hydralazine 10 mg every  6 hours as needed for SBP > 170   Type 2 diabetes mellitus with hyperglycemia Hemoglobin A1c at 5.8 Continue ISS and hypoglycemia protocol  DVT prophylaxis: Heparin drip  to SCDs Code Status: Full Family Communication: None at bedside Disposition Plan:  Status is: Inpatient Remains inpatient appropriate because: Need for IV medications  Consultants:  Cardiology Nephrology  Procedures:  None  Antimicrobials:  Anti-infectives (From admission, onward)    Start     Dose/Rate Route Frequency Ordered Stop   07/15/23 1230  oseltamivir (TAMIFLU) capsule 30 mg        30 mg Oral Every M-W-F (Hemodialysis) 07/15/23 1140 07/27/23 1159   07/15/23 1200  oseltamivir (TAMIFLU) capsule 30 mg  Status:  Discontinued        30 mg Oral Every other day 07/15/23 1110 07/15/23 1116   07/15/23 0300  cefTRIAXone (ROCEPHIN) 1 g in sodium chloride 0.9 % 100 mL IVPB        1 g 200 mL/hr over 30 Minutes Intravenous Every 24 hours 07/15/23 0256     07/15/23 0300  azithromycin (ZITHROMAX) 500 mg in sodium chloride 0.9 % 250 mL IVPB        500 mg 250 mL/hr over 60 Minutes Intravenous Every 24 hours 07/15/23 0256         Subjective: Patient seen and evaluated today with no new acute complaints or concerns. No acute concerns or events noted overnight.  He continues to have elevated blood pressure readings and remains on nitroglycerin drip.  No chest pain or shortness of breath noted.  Hemoglobin levels slightly downward trending.  No hematochezia or dark stools noted.  Objective: Vitals:   07/17/23 0930 07/17/23 0945 07/17/23 1000 07/17/23 1015  BP: (!) 161/66 (!) 158/54 (!) 150/57 (!) 158/59  Pulse: 81 80 79 80  Resp: (!) 22 17 15 16   Temp:      TempSrc:      SpO2:  94% 93% 94%  Weight:      Height:        Intake/Output Summary (Last 24 hours) at 07/17/2023 1057 Last data filed at 07/17/2023 1024 Gross per 24 hour  Intake 2333.49 ml  Output --  Net 2333.49 ml   Filed Weights   07/14/23 1902 07/17/23 0500  Weight: 72.1 kg 84 kg    Examination:  General exam: Appears calm and comfortable  Respiratory system: Diminished to auscultation bilaterally. Respiratory  effort normal.  5 L nasal cannula Cardiovascular system: S1 & S2 heard, RRR.  Gastrointestinal system: Abdomen is soft Central nervous system: Alert and awake Extremities: No edema Skin: No significant lesions noted Psychiatry: Flat affect.    Data Reviewed: I have personally reviewed following labs and imaging studies  CBC: Recent Labs  Lab 07/14/23 1910 07/14/23 2042 07/15/23 0119 07/16/23 0501 07/17/23 0308  WBC  --  13.4* 10.0 5.2 5.6  NEUTROABS  --  12.3*  --   --   --   HGB 11.9* 10.0* 9.2* 7.9* 7.5*  HCT 35.0* 30.3* 28.1* 24.5* 22.4*  MCV  --  96.8 96.2 95.7 94.9  PLT  --  227 196 208 188   Basic Metabolic Panel: Recent Labs  Lab 07/14/23 1910 07/14/23 2042 07/15/23 0119 07/15/23 0959 07/16/23 0501 07/17/23 0308  NA 136 133* 136  137  --  133* 131*  K 4.1 4.2 4.1  4.2  --  3.5 3.8  CL 100 99 100  99  --  96* 94*  CO2  --  19* 22  22  --  25 23  GLUCOSE 195* 174* 126*  127*  --  103* 94  BUN 46* 53* 59*  58*  --  47* 66*  CREATININE 8.60* 8.14* 8.93*  8.96*  --  7.34* 9.90*  CALCIUM  --  7.7* 7.8*  7.9*  --  7.6* 7.2*  MG  --   --  2.1  --  2.0 2.0  PHOS  --   --  4.6  4.5 5.1*  --   --    GFR: Estimated Creatinine Clearance: 7.8 mL/min (A) (by C-G formula based on SCr of 9.9 mg/dL (H)). Liver Function Tests: Recent Labs  Lab 07/14/23 2042 07/15/23 0119  AST 21 17  ALT 13 12  ALKPHOS 55 50  BILITOT 1.1 1.1  PROT 7.6 7.0  ALBUMIN 3.2* 2.9*  2.9*   No results for input(s): "LIPASE", "AMYLASE" in the last 168 hours. No results for input(s): "AMMONIA" in the last 168 hours. Coagulation Profile: No results for input(s): "INR", "PROTIME" in the last 168 hours. Cardiac Enzymes: No results for input(s): "CKTOTAL", "CKMB", "CKMBINDEX", "TROPONINI" in the last 168 hours. BNP (last 3 results) No results for input(s): "PROBNP" in the last 8760 hours. HbA1C: No results for input(s): "HGBA1C" in the last 72 hours. CBG: Recent Labs  Lab  07/14/23 2123 07/15/23 1946  GLUCAP 165* 100*   Lipid Profile: Recent Labs    07/16/23 0501  CHOL 106  HDL 43  LDLCALC 46  TRIG 86  CHOLHDL 2.5   Thyroid Function Tests: No results for input(s): "TSH", "T4TOTAL", "FREET4", "T3FREE", "THYROIDAB" in the last 72 hours. Anemia Panel: Recent Labs    07/17/23 0308  VITAMINB12 1,164*  FOLATE 9.5  FERRITIN >1,500*  TIBC 126*  IRON 47  RETICCTPCT 0.8   Sepsis Labs: Recent Labs  Lab 07/15/23 0119  PROCALCITON 15.82    Recent Results (from the past 240 hours)  Culture, blood (Routine X 2) w Reflex to ID Panel     Status: None (Preliminary result)   Collection Time: 07/15/23  3:03 AM   Specimen: BLOOD  Result Value Ref Range Status   Specimen Description BLOOD BLOOD RIGHT ARM  Final   Special Requests   Final    BOTTLES DRAWN AEROBIC AND ANAEROBIC Blood Culture adequate volume   Culture   Final    NO GROWTH 2 DAYS Performed at [facility], [address] [place], [place] [address]    Report Status PENDING  Incomplete  Culture, blood (Routine X 2) w Reflex to ID Panel     Status: None (Preliminary result)   Collection Time: 07/15/23  3:05 AM   Specimen: BLOOD  Result Value Ref Range Status   Specimen Description BLOOD BLOOD RIGHT HAND  Final   Special Requests   Final    BOTTLES DRAWN AEROBIC AND ANAEROBIC Blood Culture adequate volume   Culture   Final    NO GROWTH 2 DAYS Performed at [facility], [address] [place], [place] [address]    Report Status PENDING  Incomplete  MRSA Next Gen by PCR, Nasal     Status: None   Collection Time: 07/15/23  8:40 AM   Specimen: Nasal Mucosa; Nasal Swab  Result Value Ref Range Status   MRSA by PCR Next Gen NOT DETECTED NOT DETECTED Final    Comment: (NOTE) The GeneXpert MRSA Assay (FDA approved for NASAL specimens only), is one component of a comprehensive MRSA colonization surveillance program. It  is not intended to diagnose MRSA infection nor to guide or monitor  treatment for MRSA infections. Test performance is not FDA approved in patients less than 56 years old. Performed at [facility], [address], [place], [place] [address]   Resp panel by RT-PCR (RSV, Flu A&B, Covid) Anterior Nasal Swab     Status: Abnormal   Collection Time: 07/15/23  9:21 AM   Specimen: Anterior Nasal Swab  Result Value Ref Range Status   SARS Coronavirus 2 by RT PCR NEGATIVE NEGATIVE Final    Comment: (NOTE) SARS-CoV-2 target nucleic acids are NOT DETECTED.  The SARS-CoV-2 RNA is generally detectable in upper respiratory specimens during the acute phase of infection. The lowest concentration of SARS-CoV-2 viral copies this assay can detect is 138 copies/mL. A negative result does not preclude SARS-Cov-2 infection and should not be used as the sole basis for treatment or other patient management decisions. A negative result may occur with  improper specimen collection/handling, submission of specimen other than nasopharyngeal swab, presence of viral mutation(s) within the areas targeted by this assay, and inadequate number of viral copies(<138 copies/mL). A negative result must be combined with clinical observations, patient history, and epidemiological information. The expected result is Negative.  Fact Sheet for Patients:  BloggerCourse.com  Fact Sheet for Healthcare Providers:  SeriousBroker.it  This test is no t yet approved or cleared by the [place] FDA and  has been authorized for detection and/or diagnosis of SARS-CoV-2 by FDA under an Emergency Use Authorization (EUA). This EUA will remain  in effect (meaning this test can be used) for the duration of the COVID-19 declaration under Section 564(b)(1) of the Act, 21 U.S.C.section 360bbb-3(b)(1), unless the authorization is terminated  or revoked sooner.       Influenza A by PCR POSITIVE (A) NEGATIVE Final   Influenza B by PCR NEGATIVE NEGATIVE  Final    Comment: (NOTE) The Xpert Xpress SARS-CoV-2/FLU/RSV plus assay is intended as an aid in the diagnosis of influenza from Nasopharyngeal swab specimens and should not be used as a sole basis for treatment. Nasal washings and aspirates are unacceptable for Xpert Xpress SARS-CoV-2/FLU/RSV testing.  Fact Sheet for Patients: BloggerCourse.com  Fact Sheet for Healthcare Providers: SeriousBroker.it  This test is not yet approved or cleared by the [place] FDA and has been authorized for detection and/or diagnosis of SARS-CoV-2 by FDA under an Emergency Use Authorization (EUA). This EUA will remain in effect (meaning this test can be used) for the duration of the COVID-19 declaration under Section 564(b)(1) of the Act, 21 U.S.C. section 360bbb-3(b)(1), unless the authorization is terminated or revoked.     Resp Syncytial Virus by PCR NEGATIVE NEGATIVE Final    Comment: (NOTE) Fact Sheet for Patients: BloggerCourse.com  Fact Sheet for Healthcare Providers: SeriousBroker.it  This test is not yet approved or cleared by the [place] FDA and has been authorized for detection and/or diagnosis of SARS-CoV-2 by FDA under an Emergency Use Authorization (EUA). This EUA will remain in effect (meaning this test can be used) for the duration of the COVID-19 declaration under Section 564(b)(1) of the Act, 21 U.S.C. section 360bbb-3(b)(1), unless the authorization is terminated or revoked.  Performed at [facility], [address], [place], [place] [address]          Radiology Studies: ECHOCARDIOGRAM COMPLETE Result Date: 07/15/2023    ECHOCARDIOGRAM REPORT   Patient Name:   Matthew Robles Date of Exam: 07/15/2023 Medical Rec #:  [id number]  Height:       72.0 in Accession #:    [id number]      Weight:       159.0 lb Date of Birth:  December 24, 1955       BSA:          1.932 m  Patient Age:    67 years        BP:           165/68 mmHg Patient Gender: M               HR:           87 bpm. [facility] Procedure: 2D Echo, Cardiac Doppler and Color Doppler (Both Spectral and Color            Flow Doppler were utilized during procedure). Indications:    CHF-Acute Systolic I50.21  History:        Patient has prior history of Echocardiogram examinations, most                 recent 03/29/2023. CHF; Risk Factors:Hypertension, Diabetes and                 Dyslipidemia.  Sonographer:    Celesta Gentile RCS Referring Phys: [id number] OLADAPO ADEFESO IMPRESSIONS  1. Left ventricular ejection fraction, by estimation, is 60 to 65%. The left ventricle has normal function. The left ventricle has no regional wall motion abnormalities. There is moderate left ventricular hypertrophy. Left ventricular diastolic parameters are indeterminate. Elevated left atrial pressure.  2. Right ventricular systolic function is normal. The right ventricular size is normal. Tricuspid regurgitation signal is inadequate for assessing PA pressure.  3. Left atrial size was severely dilated.  4. Right atrial size was severely dilated.  5. The mitral valve is normal in structure. No evidence of mitral valve regurgitation. No evidence of mitral stenosis.  6. The aortic valve is tricuspid. There is mild calcification of the aortic valve. There is mild thickening of the aortic valve. Aortic valve regurgitation is not visualized. No aortic stenosis is present.  7. The inferior vena cava is normal in size with greater than 50% respiratory variability, suggesting right atrial pressure of 3 mmHg. FINDINGS  Left Ventricle: Left ventricular ejection fraction, by estimation, is 60 to 65%. The left ventricle has normal function. The left ventricle has no regional wall motion abnormalities. Strain imaging was not performed. The left ventricular internal cavity  size was normal in size. There is moderate left ventricular hypertrophy.  Left ventricular diastolic parameters are indeterminate. Elevated left atrial pressure. Right Ventricle: The right ventricular size is normal. Right vetricular wall thickness was not well visualized. Right ventricular systolic function is normal. Tricuspid regurgitation signal is inadequate for assessing PA pressure. Left Atrium: Left atrial size was severely dilated. Right Atrium: Right atrial size was severely dilated. Pericardium: There is no evidence of pericardial effusion. Mitral Valve: The mitral valve is normal in structure. No evidence of mitral valve regurgitation. No evidence of mitral valve stenosis. Tricuspid Valve: The tricuspid valve is normal in structure. Tricuspid valve regurgitation is not demonstrated. No evidence of tricuspid stenosis. Aortic Valve: The aortic valve is tricuspid. There is mild calcification of the aortic valve. There is mild thickening of the aortic valve. There is mild aortic valve annular calcification. Aortic valve regurgitation is not visualized. No aortic stenosis  is present. Aortic valve mean gradient measures 3.5 mmHg. Aortic valve peak gradient measures 6.3 mmHg. Aortic valve area, by VTI  measures 2.90 cm. Pulmonic Valve: The pulmonic valve was not well visualized. Pulmonic valve regurgitation is not visualized. No evidence of pulmonic stenosis. Aorta: The aortic root is normal in size and structure. Venous: The inferior vena cava is normal in size with greater than 50% respiratory variability, suggesting right atrial pressure of 3 mmHg. IAS/Shunts: No atrial level shunt detected by color flow Doppler. Additional Comments: 3D imaging was not performed.  LEFT VENTRICLE PLAX 2D LVIDd:         5.20 cm   Diastology LVIDs:         3.50 cm   LV e' medial:    5.33 cm/s LV PW:         1.20 cm   LV E/e' medial:  19.3 LV IVS:        1.30 cm   LV e' lateral:   5.55 cm/s LVOT diam:     2.20 cm   LV E/e' lateral: 18.6 LV SV:         63 LV SV Index:   32 LVOT Area:     3.80 cm   RIGHT VENTRICLE RV S prime:     19.60 cm/s TAPSE (M-mode): 2.2 cm LEFT ATRIUM              Index        RIGHT ATRIUM           Index LA diam:        4.90 cm  2.54 cm/m   RA Area:     28.30 cm LA Vol (A2C):   112.0 ml 57.96 ml/m  RA Volume:   94.10 ml  48.70 ml/m LA Vol (A4C):   169.0 ml 87.46 ml/m LA Biplane Vol: 142.0 ml 73.48 ml/m  AORTIC VALVE AV Area (Vmax):    2.73 cm AV Area (Vmean):   2.79 cm AV Area (VTI):     2.90 cm AV Vmax:           125.47 cm/s AV Vmean:          87.901 cm/s AV VTI:            0.217 m AV Peak Grad:      6.3 mmHg AV Mean Grad:      3.5 mmHg LVOT Vmax:         90.10 cm/s LVOT Vmean:        64.600 cm/s LVOT VTI:          0.165 m LVOT/AV VTI ratio: 0.76  AORTA Ao Root diam: 3.40 cm MITRAL VALVE MV Area (PHT): 5.54 cm     SHUNTS MV Decel Time: 137 msec     Systemic VTI:  0.16 m MV E velocity: 103.00 cm/s  Systemic Diam: 2.20 cm MV A velocity: 90.00 cm/s MV E/A ratio:  1.14 Dina Rich MD Electronically signed by Dina Rich MD Signature Date/Time: 07/15/2023/5:22:47 PM    Final         Scheduled Meds:  amLODipine  10 mg Oral Daily   aspirin EC  81 mg Oral Daily   calcium carbonate  1 tablet Oral Q breakfast   Chlorhexidine Gluconate Cloth  6 each Topical Q0600   [START ON 07/18/2023] darbepoetin (ARANESP) injection - DIALYSIS  100 mcg Subcutaneous Q Mon-1800   dextromethorphan-guaiFENesin  1 tablet Oral BID   feeding supplement (GLUCERNA SHAKE)  237 mL Oral TID BM   hydrALAZINE  200 mg Oral TID   labetalol  400 mg Oral BID  oseltamivir  30 mg Oral Q M,W,F-HD   oxymetazoline  1 spray Each Nare BID   rosuvastatin  10 mg Oral Daily   Continuous Infusions:  azithromycin 500 mg (07/17/23 0439)   cefTRIAXone (ROCEPHIN)  IV 1 g (07/17/23 0357)   nitroGLYCERIN 65 mcg/min (07/17/23 1024)     LOS: 3 days    Critical care time spent: 55 minutes    Matthew Herne Hoover Brunette, DO [facility]  If 7PM-7AM, please contact  night-coverage [email or web address] 07/17/2023, 10:57 AM

## 2023-07-18 DIAGNOSIS — I5043 Acute on chronic combined systolic (congestive) and diastolic (congestive) heart failure: Secondary | ICD-10-CM | POA: Diagnosis not present

## 2023-07-18 LAB — CBC
HCT: 24.9 % — ABNORMAL LOW (ref 39.0–52.0)
Hemoglobin: 8.1 g/dL — ABNORMAL LOW (ref 13.0–17.0)
MCH: 31 pg (ref 26.0–34.0)
MCHC: 32.5 g/dL (ref 30.0–36.0)
MCV: 95.4 fL (ref 80.0–100.0)
Platelets: 192 10*3/uL (ref 150–400)
RBC: 2.61 MIL/uL — ABNORMAL LOW (ref 4.22–5.81)
RDW: 14.2 % (ref 11.5–15.5)
WBC: 6.1 10*3/uL (ref 4.0–10.5)
nRBC: 0 % (ref 0.0–0.2)

## 2023-07-18 LAB — BASIC METABOLIC PANEL
Anion gap: 15 (ref 5–15)
BUN: 84 mg/dL — ABNORMAL HIGH (ref 8–23)
CO2: 20 mmol/L — ABNORMAL LOW (ref 22–32)
Calcium: 7.3 mg/dL — ABNORMAL LOW (ref 8.9–10.3)
Chloride: 94 mmol/L — ABNORMAL LOW (ref 98–111)
Creatinine, Ser: 11.96 mg/dL — ABNORMAL HIGH (ref 0.61–1.24)
GFR, Estimated: 4 mL/min — ABNORMAL LOW (ref >60)
Glucose, Bld: 111 mg/dL — ABNORMAL HIGH (ref 70–99)
Potassium: 3.8 mmol/L (ref 3.5–5.1)
Sodium: 129 mmol/L — ABNORMAL LOW (ref 135–145)

## 2023-07-18 LAB — ALBUMIN: Albumin: 2.7 g/dL — ABNORMAL LOW (ref 3.5–5.0)

## 2023-07-18 LAB — FERRITIN: Ferritin: >1662 ng/mL — ABNORMAL HIGH (ref 24–336)

## 2023-07-18 LAB — MAGNESIUM: Magnesium: 2.3 mg/dL (ref 1.7–2.4)

## 2023-07-18 LAB — OCCULT BLOOD X 1 CARD TO LAB, STOOL: Fecal Occult Bld: NEGATIVE

## 2023-07-18 MED ORDER — IRBESARTAN 75 MG PO TABS
75.0000 mg | ORAL_TABLET | Freq: Every day | ORAL | Status: DC
  Administered 2023-07-18: 75 mg via ORAL
  Filled 2023-07-18: qty 1

## 2023-07-18 MED ORDER — SIMETHICONE 80 MG PO CHEW
80.0000 mg | CHEWABLE_TABLET | Freq: Once | ORAL | Status: AC
  Administered 2023-07-18: 80 mg via ORAL
  Filled 2023-07-18: qty 1

## 2023-07-18 NOTE — Progress Notes (Signed)
 Patient able to tolerate weaning off nitroglycerin gtt. Gtt stopped at 1505.

## 2023-07-18 NOTE — Progress Notes (Signed)
 PROGRESS NOTE    Matthew Robles  ZOX:[id number] DOB: Jul 14, 1955 DOA: 07/14/2023 PCP: Elmer Picker, [facility]   Brief Narrative:    Matthew Robles is a 68 y.o. male with medical history significant of hypertension, end-stage renal disease on hemodialysis (MWF), type 2 diabetes mellitus, history of stroke who presents to the emergency department from home via EMS due to increased work of breathing which started about 1 hour prior to arrival to the ED.  EMS was activated and on arrival of EMS team, O2 sat was noted to be 61% on home oxygen (patient states that he uses supplemental oxygen only as needed at home).  He was placed on NRB with O2 sat increasing to 73%.  O2 sat increased to 91% on CPAP per report.  Patient was admitted with acute hypoxemic respiratory failure secondary to acute on chronic HFmrEF with hypertensive emergency and is also noted to have elevated troponin levels.  He is a dialysis patient and nephrology plans to hemodialyzed and he has been started on nitroglycerin drip which has now been weaned off.  He is also noted to be positive for influenza A.  Assessment & Plan:   Principal Problem:   Acute on chronic combined systolic and diastolic CHF (congestive heart failure) (HCC) Active Problems:   ESRD on hemodialysis (HCC)   Acute respiratory failure with hypoxia (HCC)   Hypoalbuminemia due to protein-calorie malnutrition (HCC)   Elevated troponin   Essential hypertension   CAP (community acquired pneumonia)   Hypocalcemia   Type 2 diabetes mellitus with hyperglycemia (HCC)  Assessment and Plan:   Acute hypoxemic respiratory failure secondary to acute on chronic combined systolic and diastolic CHF Chest x-ray was suggestive of pulmonary edema BNP was 3,242 (this was 2781 on 03/27/2023) Continue total input/output, daily weights and fluid restriction Currently weaned off of BiPAP and now on nasal cannula, wean to room air as tolerated Continue heart  healthy diet      Echocardiogram done in November 2024 showed LVEF of 45 to 50%.  LV demonstrates global hypokinesis.  Moderate LVH.  G3 DD.  Echocardiogram ordered and pending This is possibly due to patient's fluid overload.  Continue BiPAP at this time with plan to wean patient off this as tolerated as some level based on Continue hemodialysis  Hypertensive emergency-improving Continue Norvasc 10 mg daily and hydralazine increased to 200 mg 3 times daily Resumed home labetalol 2/22 Started on nitroglycerin drip per cardiology which has now been weaned off Likely to improve further with hemodialysis today, continue to monitor   Elevated troponin possibly due to type II demand ischemia Troponin trend decreasing, continue to monitor Heparin drip discontinued Patient denies chest pain 2D echocardiogram 2/21 without any wall motion abnormalities and preserved LVEF   ESRD on HD (MWF) Nephrology following and patient has had HD 2/21   Presumed CAP POA along with influenza A Chest x-ray was suggestive of pneumonia Patient was started on ceftriaxone and azithromycin, we shall continue same at this time with plan to de-escalate/discontinue based on blood culture, sputum culture, urine Legionella, strep pneumo and procalcitonin elevated at 15.82 Tamiflu started Continue Tylenol as needed Continue Mucinex, incentive spirometry, flutter valve    Hypocalcemia Continue to monitor Continue Os-Cal  Worsening anemia Monitor CBC while on heparin drip No overt bleeding noted Check stool occult Anemia panel within normal limits Nephrology to give ESA with dialysis   Hypoalbuminemia possible secondary to mild protein calorie malnutrition Albumin 3.2, protein supplement will be provided  Type 2 diabetes mellitus with hyperglycemia Hemoglobin A1c at 5.8 Continue ISS and hypoglycemia protocol  DVT prophylaxis: SCDs Code Status: Full Family Communication: None at bedside Disposition Plan:   Status is: Inpatient Remains inpatient appropriate because: Need for IV medications  Consultants:  Cardiology Nephrology  Procedures:  None  Antimicrobials:  Anti-infectives (From admission, onward)    Start     Dose/Rate Route Frequency Ordered Stop   07/15/23 1230  oseltamivir (TAMIFLU) capsule 30 mg        30 mg Oral Every M-W-F (Hemodialysis) 07/15/23 1140 07/27/23 1159   07/15/23 1200  oseltamivir (TAMIFLU) capsule 30 mg  Status:  Discontinued        30 mg Oral Every other day 07/15/23 1110 07/15/23 1116   07/15/23 0300  cefTRIAXone (ROCEPHIN) 1 g in sodium chloride 0.9 % 100 mL IVPB        1 g 200 mL/hr over 30 Minutes Intravenous Every 24 hours 07/15/23 0256     07/15/23 0300  azithromycin (ZITHROMAX) 500 mg in sodium chloride 0.9 % 250 mL IVPB        500 mg 250 mL/hr over 60 Minutes Intravenous Every 24 hours 07/15/23 0256         Subjective: Patient seen and evaluated today with no new acute complaints or concerns. No acute concerns or events noted overnight.  His blood pressure has improved and he has been weaned off of nitroglycerin drip.  No chest pain or shortness of breath noted.  Hemoglobin levels remaining stable this a.m.  Plans for further hemodialysis today.  Objective: Vitals:   07/18/23 0800 07/18/23 0900 07/18/23 0930 07/18/23 1000  BP: (!) 190/98 (!) 163/61 (!) 129/92 (!) 146/55  Pulse: 82 72 76 75  Resp: 15 10 13 10   Temp:      TempSrc:      SpO2: 100% 98% 100% 97%  Weight:      Height:        Intake/Output Summary (Last 24 hours) at 07/18/2023 1027 Last data filed at 07/18/2023 0748 Gross per 24 hour  Intake 770.77 ml  Output 0 ml  Net 770.77 ml   Filed Weights   07/14/23 1902 07/17/23 0500  Weight: 72.1 kg 84 kg    Examination:  General exam: Appears calm and comfortable  Respiratory system: Diminished to auscultation bilaterally. Respiratory effort normal.  4 L nasal cannula Cardiovascular system: S1 & S2 heard, RRR.   Gastrointestinal system: Abdomen is soft Central nervous system: Alert and awake Extremities: No edema Skin: No significant lesions noted Psychiatry: Flat affect.    Data Reviewed: I have personally reviewed following labs and imaging studies  CBC: Recent Labs  Lab 07/14/23 2042 07/15/23 0119 07/16/23 0501 07/17/23 0308 07/18/23 0334  WBC 13.4* 10.0 5.2 5.6 6.1  NEUTROABS 12.3*  --   --   --   --   HGB 10.0* 9.2* 7.9* 7.5* 8.1*  HCT 30.3* 28.1* 24.5* 22.4* 24.9*  MCV 96.8 96.2 95.7 94.9 95.4  PLT 227 196 208 188 192   Basic Metabolic Panel: Recent Labs  Lab 07/14/23 2042 07/15/23 0119 07/15/23 0959 07/16/23 0501 07/17/23 0308 07/18/23 0334  NA 133* 136  137  --  133* 131* 129*  K 4.2 4.1  4.2  --  3.5 3.8 3.8  CL 99 100  99  --  96* 94* 94*  CO2 19* 22  22  --  25 23 20*  GLUCOSE 174* 126*  127*  --  103*  94 111*  BUN 53* 59*  58*  --  47* 66* 84*  CREATININE 8.14* 8.93*  8.96*  --  7.34* 9.90* 11.96*  CALCIUM 7.7* 7.8*  7.9*  --  7.6* 7.2* 7.3*  MG  --  2.1  --  2.0 2.0 2.3  PHOS  --  4.6  4.5 5.1*  --   --   --    GFR: Estimated Creatinine Clearance: 6.5 mL/min (A) (by C-G formula based on SCr of 11.96 mg/dL (H)). Liver Function Tests: Recent Labs  Lab 07/14/23 2042 07/15/23 0119  AST 21 17  ALT 13 12  ALKPHOS 55 50  BILITOT 1.1 1.1  PROT 7.6 7.0  ALBUMIN 3.2* 2.9*  2.9*   No results for input(s): "LIPASE", "AMYLASE" in the last 168 hours. No results for input(s): "AMMONIA" in the last 168 hours. Coagulation Profile: No results for input(s): "INR", "PROTIME" in the last 168 hours. Cardiac Enzymes: No results for input(s): "CKTOTAL", "CKMB", "CKMBINDEX", "TROPONINI" in the last 168 hours. BNP (last 3 results) No results for input(s): "PROBNP" in the last 8760 hours. HbA1C: No results for input(s): "HGBA1C" in the last 72 hours. CBG: Recent Labs  Lab 07/14/23 2123 07/15/23 1946  GLUCAP 165* 100*   Lipid Profile: Recent Labs     07/16/23 0501  CHOL 106  HDL 43  LDLCALC 46  TRIG 86  CHOLHDL 2.5   Thyroid Function Tests: No results for input(s): "TSH", "T4TOTAL", "FREET4", "T3FREE", "THYROIDAB" in the last 72 hours. Anemia Panel: Recent Labs    07/17/23 0308  VITAMINB12 1,164*  FOLATE 9.5  FERRITIN >1,500*  TIBC 126*  IRON 47  RETICCTPCT 0.8   Sepsis Labs: Recent Labs  Lab 07/15/23 0119  PROCALCITON 15.82    Recent Results (from the past 240 hours)  Culture, blood (Routine X 2) w Reflex to ID Panel     Status: None (Preliminary result)   Collection Time: 07/15/23  3:03 AM   Specimen: BLOOD  Result Value Ref Range Status   Specimen Description BLOOD BLOOD RIGHT ARM  Final   Special Requests   Final    BOTTLES DRAWN AEROBIC AND ANAEROBIC Blood Culture adequate volume   Culture   Final    NO GROWTH 2 DAYS Performed at [facility], [address] [place], [place] [address]    Report Status PENDING  Incomplete  Culture, blood (Routine X 2) w Reflex to ID Panel     Status: None (Preliminary result)   Collection Time: 07/15/23  3:05 AM   Specimen: BLOOD  Result Value Ref Range Status   Specimen Description BLOOD BLOOD RIGHT HAND  Final   Special Requests   Final    BOTTLES DRAWN AEROBIC AND ANAEROBIC Blood Culture adequate volume   Culture   Final    NO GROWTH 2 DAYS Performed at [facility], [address], [place], [place] [address]    Report Status PENDING  Incomplete  MRSA Next Gen by PCR, Nasal     Status: None   Collection Time: 07/15/23  8:40 AM   Specimen: Nasal Mucosa; Nasal Swab  Result Value Ref Range Status   MRSA by PCR Next Gen NOT DETECTED NOT DETECTED Final    Comment: (NOTE) The GeneXpert MRSA Assay (FDA approved for NASAL specimens only), is one component of a comprehensive MRSA colonization surveillance program. It is not intended to diagnose MRSA infection nor to guide or monitor treatment for MRSA infections. Test performance is not FDA approved in  patients less  than 49 years old. Performed at [facility], [address], [place], [place] [address]   Resp panel by RT-PCR (RSV, Flu A&B, Covid) Anterior Nasal Swab     Status: Abnormal   Collection Time: 07/15/23  9:21 AM   Specimen: Anterior Nasal Swab  Result Value Ref Range Status   SARS Coronavirus 2 by RT PCR NEGATIVE NEGATIVE Final    Comment: (NOTE) SARS-CoV-2 target nucleic acids are NOT DETECTED.  The SARS-CoV-2 RNA is generally detectable in upper respiratory specimens during the acute phase of infection. The lowest concentration of SARS-CoV-2 viral copies this assay can detect is 138 copies/mL. A negative result does not preclude SARS-Cov-2 infection and should not be used as the sole basis for treatment or other patient management decisions. A negative result may occur with  improper specimen collection/handling, submission of specimen other than nasopharyngeal swab, presence of viral mutation(s) within the areas targeted by this assay, and inadequate number of viral copies(<138 copies/mL). A negative result must be combined with clinical observations, patient history, and epidemiological information. The expected result is Negative.  Fact Sheet for Patients:  BloggerCourse.com  Fact Sheet for Healthcare Providers:  SeriousBroker.it  This test is no t yet approved or cleared by the [place] FDA and  has been authorized for detection and/or diagnosis of SARS-CoV-2 by FDA under an Emergency Use Authorization (EUA). This EUA will remain  in effect (meaning this test can be used) for the duration of the COVID-19 declaration under Section 564(b)(1) of the Act, 21 U.S.C.section 360bbb-3(b)(1), unless the authorization is terminated  or revoked sooner.       Influenza A by PCR POSITIVE (A) NEGATIVE Final   Influenza B by PCR NEGATIVE NEGATIVE Final    Comment: (NOTE) The Xpert Xpress SARS-CoV-2/FLU/RSV plus assay is intended  as an aid in the diagnosis of influenza from Nasopharyngeal swab specimens and should not be used as a sole basis for treatment. Nasal washings and aspirates are unacceptable for Xpert Xpress SARS-CoV-2/FLU/RSV testing.  Fact Sheet for Patients: BloggerCourse.com  Fact Sheet for Healthcare Providers: SeriousBroker.it  This test is not yet approved or cleared by the [place] FDA and has been authorized for detection and/or diagnosis of SARS-CoV-2 by FDA under an Emergency Use Authorization (EUA). This EUA will remain in effect (meaning this test can be used) for the duration of the COVID-19 declaration under Section 564(b)(1) of the Act, 21 U.S.C. section 360bbb-3(b)(1), unless the authorization is terminated or revoked.     Resp Syncytial Virus by PCR NEGATIVE NEGATIVE Final    Comment: (NOTE) Fact Sheet for Patients: BloggerCourse.com  Fact Sheet for Healthcare Providers: SeriousBroker.it  This test is not yet approved or cleared by the [place] FDA and has been authorized for detection and/or diagnosis of SARS-CoV-2 by FDA under an Emergency Use Authorization (EUA). This EUA will remain in effect (meaning this test can be used) for the duration of the COVID-19 declaration under Section 564(b)(1) of the Act, 21 U.S.C. section 360bbb-3(b)(1), unless the authorization is terminated or revoked.  Performed at [facility], [address], [place], [place] [address]          Radiology Studies: No results found.       Scheduled Meds:  amLODipine  10 mg Oral Daily   calcium carbonate  1 tablet Oral Q breakfast   Chlorhexidine Gluconate Cloth  6 each Topical Q0600   darbepoetin (ARANESP) injection - DIALYSIS  100 mcg Subcutaneous Q Mon-1800  dextromethorphan-guaiFENesin  1 tablet Oral BID   feeding supplement (GLUCERNA SHAKE)  237 mL Oral TID BM    hydrALAZINE  200 mg Oral TID   labetalol  400 mg Oral BID   oseltamivir  30 mg Oral Q M,W,F-HD   oxymetazoline  1 spray Each Nare BID   rosuvastatin  10 mg Oral Daily   Continuous Infusions:  azithromycin 250 mL/hr at 07/18/23 0748   cefTRIAXone (ROCEPHIN)  IV 1 g (07/18/23 0335)   nitroGLYCERIN Stopped (07/17/23 1505)     LOS: 4 days    Total care time spent: 55 minutes    Shaden Higley D Sherryll Burger, DO [facility]  If 7PM-7AM, please contact night-coverage [email or web address] 07/18/2023, 10:27 AM

## 2023-07-18 NOTE — Progress Notes (Signed)
 Admit: 07/14/2023 LOS: 4  35M ESRD MWF with NSTEMI / HTN emergency / Flu A1  Subjective:  Blood pressures been elevated but better than presentation on amlodipine, hydralazine, labetalol. Ceftriaxone/azithromycin for potential pneumonia On Tamiflu for influenza Had HD 2/21 with 2.5 L removed  02/23 0701 - 02/24 0700 In: 1372.8 [P.O.:837; I.V.:189.7; IV Piggyback:346.2] Out: 0   Filed Weights   07/14/23 1902 07/17/23 0500  Weight: 72.1 kg 84 kg    Scheduled Meds:  amLODipine  10 mg Oral Daily   calcium carbonate  1 tablet Oral Q breakfast   Chlorhexidine Gluconate Cloth  6 each Topical Q0600   darbepoetin (ARANESP) injection - DIALYSIS  100 mcg Subcutaneous Q Mon-1800   dextromethorphan-guaiFENesin  1 tablet Oral BID   feeding supplement (GLUCERNA SHAKE)  237 mL Oral TID BM   hydrALAZINE  200 mg Oral TID   labetalol  400 mg Oral BID   oseltamivir  30 mg Oral Q M,W,F-HD   oxymetazoline  1 spray Each Nare BID   rosuvastatin  10 mg Oral Daily   Continuous Infusions:  azithromycin 250 mL/hr at 07/18/23 0748   cefTRIAXone (ROCEPHIN)  IV 1 g (07/18/23 0335)   nitroGLYCERIN Stopped (07/17/23 1505)   PRN Meds:.acetaminophen **OR** acetaminophen, guaiFENesin-dextromethorphan, hydrALAZINE, ondansetron **OR** ondansetron (ZOFRAN) IV, pentafluoroprop-tetrafluoroeth, phenol, sodium chloride  Current Labs: reviewed   Physical Exam:  Blood pressure (!) 142/50, pulse 65, temperature 98.2 F (36.8 C), temperature source Oral, resp. rate 13, height 6' (1.829 m), weight 84 kg, SpO2 100%. Awake, alert, no distress NCAT EOMI Clear bilaterally Regular, normal S1 and S2 Soft, nontender AVF with bruit and thrill  A Hypertensive emergency with NSTEMI: Blood pressures improving, cardiology has been following.  Unusual regimen with very high-dose hydralazine which I would not favor here.  I do not see why we could not put him on an ARB which would be preferred here.  Start irbesartan 75 mg  daily.  As blood pressures improved wean down on hydralazine.  Can uptitrate irbesartan at the same time.  Continue amlodipine and labetalol.  If blood pressures remain elevated can also consider addition of spironolactone.  Troponins are trending down. ESRD for dialysis today, ordered. Flu a with potential secondary bacterial process: Continue Tamiflu, ceftriaxone, azithromycin Anemia: On Aranesp.  Iron levels with ferritin in excess of 1200 and TSAT of 37% 2/23.  No role for IV iron.  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  Add on albumin, calcium is 7.3.  P Largely as above.  Dialysis today.  Blood pressure medication changes as above. Medication Issues; Preferred narcotic agents for pain control are hydromorphone, fentanyl, and methadone. Morphine should not be used.  Baclofen should be avoided Avoid oral sodium phosphate and magnesium citrate based laxatives / bowel preps    Sabra Heck MD 07/18/2023, 10:54 AM  Recent Labs  Lab 07/15/23 0119 07/15/23 0959 07/16/23 0501 07/17/23 0308 07/18/23 0334  NA 136  137  --  133* 131* 129*  K 4.1  4.2  --  3.5 3.8 3.8  CL 100  99  --  96* 94* 94*  CO2 22  22  --  25 23 20*  GLUCOSE 126*  127*  --  103* 94 111*  BUN 59*  58*  --  47* 66* 84*  CREATININE 8.93*  8.96*  --  7.34* 9.90* 11.96*  CALCIUM 7.8*  7.9*  --  7.6* 7.2* 7.3*  PHOS 4.6  4.5 5.1*  --   --   --  Recent Labs  Lab 07/14/23 2042 07/15/23 0119 07/16/23 0501 07/17/23 0308 07/18/23 0334  WBC 13.4*   < > 5.2 5.6 6.1  NEUTROABS 12.3*  --   --   --   --   HGB 10.0*   < > 7.9* 7.5* 8.1*  HCT 30.3*   < > 24.5* 22.4* 24.9*  MCV 96.8   < > 95.7 94.9 95.4  PLT 227   < > 208 188 192   < > = values in this interval not displayed.

## 2023-07-18 NOTE — Progress Notes (Addendum)
 Progress Note  Patient Name: Matthew Robles Date of Encounter: 07/18/2023  Primary Cardiologist: Dina Rich, MD  Subjective   Sleeping but easily awoken. No CP or SOB. Feeling a little tired. Getting HD  Inpatient Medications    Scheduled Meds:  amLODipine  10 mg Oral Daily   calcium carbonate  1 tablet Oral Q breakfast   Chlorhexidine Gluconate Cloth  6 each Topical Q0600   darbepoetin (ARANESP) injection - DIALYSIS  100 mcg Subcutaneous Q Mon-1800   dextromethorphan-guaiFENesin  1 tablet Oral BID   feeding supplement (GLUCERNA SHAKE)  237 mL Oral TID BM   hydrALAZINE  200 mg Oral TID   irbesartan  75 mg Oral Daily   labetalol  400 mg Oral BID   oseltamivir  30 mg Oral Q M,W,F-HD   oxymetazoline  1 spray Each Nare BID   rosuvastatin  10 mg Oral Daily   Continuous Infusions:  azithromycin 250 mL/hr at 07/18/23 0748   cefTRIAXone (ROCEPHIN)  IV 1 g (07/18/23 0335)   nitroGLYCERIN Stopped (07/17/23 1505)   PRN Meds: acetaminophen **OR** acetaminophen, guaiFENesin-dextromethorphan, hydrALAZINE, ondansetron **OR** ondansetron (ZOFRAN) IV, pentafluoroprop-tetrafluoroeth, phenol, sodium chloride   Vital Signs    Vitals:   07/18/23 1030 07/18/23 1045 07/18/23 1047 07/18/23 1100  BP: (!) 129/31 (!) 142/50  (!) 151/54  Pulse: 70 65 67 69  Resp: 12 13 14 14   Temp: 98.1 F (36.7 C)     TempSrc: Oral     SpO2: 99% 100% 99% 99%  Weight:      Height:        Intake/Output Summary (Last 24 hours) at 07/18/2023 1105 Last data filed at 07/18/2023 0748 Gross per 24 hour  Intake 759.67 ml  Output 0 ml  Net 759.67 ml      07/17/2023    5:00 AM 07/14/2023    7:02 PM 04/01/2023    5:15 PM  Last 3 Weights  Weight (lbs) 185 lb 3 oz 158 lb 15.2 oz 158 lb 15.2 oz  Weight (kg) 84 kg 72.1 kg 72.1 kg     Telemetry    NSR - Personally Reviewed  Physical Exam   GEN: No acute distress.  HEENT: Normocephalic, atraumatic, sclera non-icteric. Neck: No JVD or  bruits. Cardiac: RRR no murmurs, rubs, or gallops.  Respiratory: Clear to auscultation bilaterally. Breathing is unlabored. GI: Soft, nontender, non-distended, BS +x 4. MS: no deformity. Extremities: No clubbing or cyanosis. No edema. Distal pedal pulses are 2+ and equal bilaterally. Neuro:  AAOx3. Follows commands. Psych:  Responds to questions appropriately with a normal affect.  Labs    High Sensitivity Troponin:   Recent Labs  Lab 07/16/23 0931 07/16/23 1521 07/16/23 2118 07/17/23 0308 07/17/23 1054  TROPONINIHS 1,170* 954* 962* 895* 635*      Cardiac EnzymesNo results for input(s): "TROPONINI" in the last 168 hours. No results for input(s): "TROPIPOC" in the last 168 hours.   Chemistry Recent Labs  Lab 07/14/23 2042 07/15/23 0119 07/16/23 0501 07/17/23 0308 07/18/23 0334  NA 133* 136  137 133* 131* 129*  K 4.2 4.1  4.2 3.5 3.8 3.8  CL 99 100  99 96* 94* 94*  CO2 19* 22  22 25 23  20*  GLUCOSE 174* 126*  127* 103* 94 111*  BUN 53* 59*  58* 47* 66* 84*  CREATININE 8.14* 8.93*  8.96* 7.34* 9.90* 11.96*  CALCIUM 7.7* 7.8*  7.9* 7.6* 7.2* 7.3*  PROT 7.6 7.0  --   --   --  ALBUMIN 3.2* 2.9*  2.9*  --   --   --   AST 21 17  --   --   --   ALT 13 12  --   --   --   ALKPHOS 55 50  --   --   --   BILITOT 1.1 1.1  --   --   --   GFRNONAA 7* 6*  6* 7* 5* 4*  ANIONGAP 15 14  16* 12 14 15      Hematology Recent Labs  Lab 07/16/23 0501 07/17/23 0308 07/18/23 0334  WBC 5.2 5.6 6.1  RBC 2.56* 2.36*  2.39* 2.61*  HGB 7.9* 7.5* 8.1*  HCT 24.5* 22.4* 24.9*  MCV 95.7 94.9 95.4  MCH 30.9 31.8 31.0  MCHC 32.2 33.5 32.5  RDW 14.6 14.5 14.2  PLT 208 188 192    BNP Recent Labs  Lab 07/14/23 2042  BNP 3,242.0*     DDimer No results for input(s): "DDIMER" in the last 168 hours.   Radiology    No results found.  Cardiac Studies   2d echo 07/15/23  1. Left ventricular ejection fraction, by estimation, is 60 to 65%. The  left ventricle has normal  function. The left ventricle has no regional  wall motion abnormalities. There is moderate left ventricular hypertrophy.  Left ventricular diastolic  parameters are indeterminate. Elevated left atrial pressure.   2. Right ventricular systolic function is normal. The right ventricular  size is normal. Tricuspid regurgitation signal is inadequate for assessing  PA pressure.   3. Left atrial size was severely dilated.   4. Right atrial size was severely dilated.   5. The mitral valve is normal in structure. No evidence of mitral valve  regurgitation. No evidence of mitral stenosis.   6. The aortic valve is tricuspid. There is mild calcification of the  aortic valve. There is mild thickening of the aortic valve. Aortic valve  regurgitation is not visualized. No aortic stenosis is present.   7. The inferior vena cava is normal in size with greater than 50%  respiratory variability, suggesting right atrial pressure of 3 mmHg.   Patient Profile     68 y.o. 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 Admitted with hypoxic respiratory failure O2 sat in the 70s requiring NRB and CPAP with BP 215/109 on arrival. Found to have influenza A and superimposed PNA. Cardiology following for elevated troponin. Reported adherence with meds, HD.  Assessment & Plan    1. Acute hypoxic respiratory failure - Likely multifactorial in the setting of pneumonia, fluid overload, influenza A - infectious management per primary team  2. Acute on Chronic HFmrEF, hypertensive emergency - similar presentations in the past in the setting of medication noncompliance but he reports he has been taking his medications regularly - 2020 [place] no evidence of renal artery stenosis  - prior EF 45-50% in 03/2023, improved to 60-65% with moderate LVH by echo 2this admission; severe bi-atrial enlargement - nephrology helping to manage BP medications;  they are starting ARB today  3. Elevated troponin - peak 1255 in setting of influenza A pneumonia, HTN emergency SBP in the 200s, ESRD, pulmonary edema, hypoxia, tachycardia - suspect demand ischemia but will review echo and recs with MD (normal EF) - NTG drip weaned off 07/17/23 - heparin stopped after 48 hours - LDL 46 on rosuvastatin 10mg  daily (max dose on HD) -  has had worsening anemia this admission, consider low dose ASA when this is felt stable  4.  ESRD - On HD. MWF schedule. Nephrology consult pending.    5. Anemia - per primary/renal  For questions or updates, please contact [facility] Please consult [email or web address] for contact info under Cardiology/STEMI.  Signed, Laurann Montana, PA-C 07/18/2023, 11:05 AM    Pt seen, examined   I agree with findings as noted above by D Dunn  Pt resting comfortably, laying flat in bed during dialysis  Lungs are relatively CTA anteriorly Cardiac RRR   No S3  no signficant murmurs ABd is benign Ext are without edema   Warm  Echo this admit LVEF 60 to 65%  Impression:  1  Elevated trop   Peak at 1255  Occurred in setting of Influenza A, HTNsive urgency, HFpEF exacerbation, hypoxia and ESRD (dialysis) Pt denies CP     Parameters improving     Echo with normal LVEF  May reflect demand in this complex setting       No evid for ongoing active ischemia    Follow     2   HFpEF     LVEF normal on current echo Fluids managed by dialysis      3  HTN  BP is still high     Nursing has reported that pt has responded to nicardipine in past    He is on amlodipine now    Will defer to renal for management      4  HL   Continue Crestor   Will sign off for now      Please call with questions

## 2023-07-18 NOTE — Progress Notes (Signed)
   HEMODIALYSIS TREATMENT NOTE:  Off NTG infusion pre-HD and oral anti-hypertensives were given by primary nurse.   Uneventful heparin-free treatment completed using left upper arm AVF.  Goal met: 4 liters removed.  No interruption in UF.  All blood was returned.  Weaned off of O2 and saturating > 95%.  Post-HD:  07/18/23 1500  Vital Signs  Temp 98 F (36.7 C)  Temp Source Oral  Pulse Rate 82  Pulse Rate Source Monitor  Resp 14  BP (!) 174/64  BP Location Right Wrist  BP Method Automatic  Patient Position (if appropriate) Sitting  Oxygen Therapy  SpO2 97 %  O2 Device Room Air  Pain Assessment  Pain Scale 0-10  Pain Score 0  Dialysis Weight  Weight 77.9 kg  Type of Weight Post-Dialysis  Post Treatment  Dialyzer Clearance Lightly streaked  Hemodialysis Intake (mL) 0 mL  Liters Processed 73.1  Fluid Removed (mL) 4000 mL  Tolerated HD Treatment Yes  Post-Hemodialysis Comments Goal met.  AVG/AVF Arterial Site Held (minutes) 7 minutes  AVG/AVF Venous Site Held (minutes) 7 minutes  Fistula / Graft Left Upper arm Arteriovenous fistula  Placement Date/Time: 07/13/22 1045   Orientation: Left  Access Location: Upper arm  Access Type: (c) Arteriovenous fistula  Fistula / Graft Assessment Thrill;Bruit  Status Patent    Arman Filter, RN AP ICU1

## 2023-07-18 NOTE — Plan of Care (Signed)
  Problem: Health Behavior/Discharge Planning: Goal: Ability to manage health-related needs will improve Outcome: Progressing   Problem: Clinical Measurements: Goal: Ability to maintain clinical measurements within normal limits will improve Outcome: Progressing Goal: Cardiovascular complication will be avoided Outcome: Progressing   Problem: Nutrition: Goal: Adequate nutrition will be maintained Outcome: Progressing   Problem: Pain Managment: Goal: General experience of comfort will improve and/or be controlled Outcome: Progressing   Problem: Clinical Measurements: Goal: Respiratory complications will improve Outcome: Adequate for Discharge   Problem: Elimination: Goal: Will not experience complications related to bowel motility Outcome: Adequate for Discharge Goal: Will not experience complications related to urinary retention Outcome: Adequate for Discharge   Problem: Safety: Goal: Ability to remain free from injury will improve Outcome: Adequate for Discharge

## 2023-07-19 DIAGNOSIS — I5043 Acute on chronic combined systolic (congestive) and diastolic (congestive) heart failure: Secondary | ICD-10-CM | POA: Diagnosis not present

## 2023-07-19 LAB — CBC
HCT: 26.8 % — ABNORMAL LOW (ref 39.0–52.0)
Hemoglobin: 8.8 g/dL — ABNORMAL LOW (ref 13.0–17.0)
MCH: 30.9 pg (ref 26.0–34.0)
MCHC: 32.8 g/dL (ref 30.0–36.0)
MCV: 94 fL (ref 80.0–100.0)
Platelets: 214 10*3/uL (ref 150–400)
RBC: 2.85 MIL/uL — ABNORMAL LOW (ref 4.22–5.81)
RDW: 13.9 % (ref 11.5–15.5)
WBC: 4.9 10*3/uL (ref 4.0–10.5)
nRBC: 0 % (ref 0.0–0.2)

## 2023-07-19 LAB — BASIC METABOLIC PANEL
Anion gap: 14 (ref 5–15)
BUN: 52 mg/dL — ABNORMAL HIGH (ref 8–23)
CO2: 24 mmol/L (ref 22–32)
Calcium: 7.7 mg/dL — ABNORMAL LOW (ref 8.9–10.3)
Chloride: 96 mmol/L — ABNORMAL LOW (ref 98–111)
Creatinine, Ser: 8.28 mg/dL — ABNORMAL HIGH (ref 0.61–1.24)
GFR, Estimated: 6 mL/min — ABNORMAL LOW (ref >60)
Glucose, Bld: 122 mg/dL — ABNORMAL HIGH (ref 70–99)
Potassium: 3.8 mmol/L (ref 3.5–5.1)
Sodium: 134 mmol/L — ABNORMAL LOW (ref 135–145)

## 2023-07-19 LAB — MAGNESIUM: Magnesium: 2.1 mg/dL (ref 1.7–2.4)

## 2023-07-19 MED ORDER — MENTHOL 3 MG MT LOZG
1.0000 | LOZENGE | OROMUCOSAL | Status: DC | PRN
  Filled 2023-07-19: qty 9

## 2023-07-19 MED ORDER — IRBESARTAN 150 MG PO TABS
300.0000 mg | ORAL_TABLET | Freq: Every day | ORAL | Status: DC
  Administered 2023-07-19 – 2023-07-21 (×3): 300 mg via ORAL
  Filled 2023-07-19 (×3): qty 2

## 2023-07-19 NOTE — Progress Notes (Signed)
 Patient ID: Matthew Robles, male   DOB: 06-08-1955, 68 y.o.   MRN: [id number] [facility] Progress Note   Assessment/ Plan:   1.  Hypertensive emergency/NSTEMI: Blood pressures remaining elevated with resumption of antihypertensive therapy as well as aggressive ultrafiltration on dialysis.  Tolerated 4 L ultrafiltration yesterday and will have aggressive ultrafiltration again tomorrow with dialysis given permissive blood pressures and rales audible on exam. 2. ESRD: Continue hemodialysis on MWF schedule with next dialysis treatment ordered for tomorrow. 3. Anemia: Low hemoglobin/hematocrit without overt loss, suspect ESA resistance in the setting of infection/influenza.  Monitor on ESA. 4. CKD-MBD: Calcium level marginally low but corrected for albumin.  Phosphorus level currently at goal.  Resume/continue renal diet and binders. 5.  Influenza A infection: Ongoing treatment with oseltamivir and empiric coverage for CAP with ceftriaxone/azithromycin.  Subjective:   Reports to be feeling better and tolerated 4 L ultrafiltration with dialysis yesterday.   Objective:   BP (!) 173/66   Pulse 77   Temp 98.2 F (36.8 C) (Oral)   Resp 18   Ht 6' (1.829 m)   Wt 77.9 kg   SpO2 100%   BMI 23.29 kg/m   Physical Exam: Gen: Sitting up on the edge of his bed, eating breakfast.  Not on supplemental oxygen. CVS: Pulse regular rhythm, normal rate, S1 and S2 normal Resp: Coarse rales bilaterally, no distinct rhonchi/wheeze Abd: Soft, flat, nontender, bowel sounds normal Ext: No lower extremity edema, left upper arm AV fistula with intact dressing  Labs: BMET Recent Labs  Lab 07/14/23 1910 07/14/23 2042 07/15/23 0119 07/15/23 0959 07/16/23 0501 07/17/23 0308 07/18/23 0334 07/19/23 0454  NA 136 133* 136  137  --  133* 131* 129* 134*  K 4.1 4.2 4.1  4.2  --  3.5 3.8 3.8 3.8  CL 100 99 100  99  --  96* 94* 94* 96*  CO2  --  19* 22  22  --  25 23 20* 24  GLUCOSE 195* 174*  126*  127*  --  103* 94 111* 122*  BUN 46* 53* 59*  58*  --  47* 66* 84* 52*  CREATININE 8.60* 8.14* 8.93*  8.96*  --  7.34* 9.90* 11.96* 8.28*  CALCIUM  --  7.7* 7.8*  7.9*  --  7.6* 7.2* 7.3* 7.7*  PHOS  --   --  4.6  4.5 5.1*  --   --   --   --    CBC Recent Labs  Lab 07/14/23 2042 07/15/23 0119 07/16/23 0501 07/17/23 0308 07/18/23 0334 07/19/23 0454  WBC 13.4*   < > 5.2 5.6 6.1 4.9  NEUTROABS 12.3*  --   --   --   --   --   HGB 10.0*   < > 7.9* 7.5* 8.1* 8.8*  HCT 30.3*   < > 24.5* 22.4* 24.9* 26.8*  MCV 96.8   < > 95.7 94.9 95.4 94.0  PLT 227   < > 208 188 192 214   < > = values in this interval not displayed.      Medications:     amLODipine  10 mg Oral Daily   calcium carbonate  1 tablet Oral Q breakfast   Chlorhexidine Gluconate Cloth  6 each Topical Q0600   darbepoetin (ARANESP) injection - DIALYSIS  100 mcg Subcutaneous Q Mon-1800   dextromethorphan-guaiFENesin  1 tablet Oral BID   feeding supplement (GLUCERNA SHAKE)  237 mL Oral TID BM   hydrALAZINE  200  mg Oral TID   irbesartan  75 mg Oral Daily   labetalol  400 mg Oral BID   oseltamivir  30 mg Oral Q M,W,F-HD   oxymetazoline  1 spray Each Nare BID   rosuvastatin  10 mg Oral Daily   Zetta Bills, MD 07/19/2023, 8:08 AM

## 2023-07-19 NOTE — Progress Notes (Signed)
 Patient was unhappy with his lunch meal. Patient only ate one bite of [place] on tray. Dietary called to get patient a salad.

## 2023-07-19 NOTE — Plan of Care (Signed)

## 2023-07-19 NOTE — Progress Notes (Signed)
 PROGRESS NOTE    Matthew Robles  ZOX:[id number] DOB: 1955/08/16 DOA: 07/14/2023 PCP: Elmer Picker, [facility]   Brief Narrative:    Matthew Robles is a 68 y.o. male with medical history significant of hypertension, end-stage renal disease on hemodialysis (MWF), type 2 diabetes mellitus, history of stroke who presents to the emergency department from home via EMS due to increased work of breathing which started about 1 hour prior to arrival to the ED.  EMS was activated and on arrival of EMS team, O2 sat was noted to be 61% on home oxygen (patient states that he uses supplemental oxygen only as needed at home).  He was placed on NRB with O2 sat increasing to 73%.  O2 sat increased to 91% on CPAP per report.  Patient was admitted with acute hypoxemic respiratory failure secondary to acute on chronic HFmrEF with hypertensive emergency and is also noted to have elevated troponin levels.  He is a dialysis patient and nephrology plans to hemodialyzed and he has been started on nitroglycerin drip which has now been weaned off.  He is also noted to be positive for influenza A.  He continues to have some uncontrolled blood pressure readings, but is stable for transfer to telemetry and will have further hemodialysis per nephrology.  Assessment & Plan:   Principal Problem:   Acute on chronic combined systolic and diastolic CHF (congestive heart failure) (HCC) Active Problems:   ESRD on hemodialysis (HCC)   Acute respiratory failure with hypoxia (HCC)   Hypoalbuminemia due to protein-calorie malnutrition (HCC)   Elevated troponin   Essential hypertension   CAP (community acquired pneumonia)   Hypocalcemia   Type 2 diabetes mellitus with hyperglycemia (HCC)  Assessment and Plan:   Acute hypoxemic respiratory failure secondary to acute on chronic combined systolic and diastolic CHF Chest x-ray was suggestive of pulmonary edema BNP was 3,242 (this was 2781 on 03/27/2023) Continue total  input/output, daily weights and fluid restriction Currently weaned off of BiPAP and now on nasal cannula, wean to room air as tolerated Continue heart healthy diet      Echocardiogram done in November 2024 showed LVEF of 45 to 50%.  LV demonstrates global hypokinesis.  Moderate LVH.  G3 DD.  Echocardiogram ordered and pending This is possibly due to patient's fluid overload.  Continue BiPAP at this time with plan to wean patient off this as tolerated as some level based on Continue hemodialysis further plan 2/26  Hypertensive emergency-resolved, still uncontrolled Weaned off of nitroglycerin drip Continue Norvasc 10 mg daily and hydralazine increased to 200 mg 3 times daily previously Started irbesartan 75 mg on 2/24 per nephrology, increased to full dose 300 mg today Resumed home labetalol 2/22 To monitor with further dialysis planned 2/26   Elevated troponin possibly due to type II demand ischemia Heparin drip discontinued Patient denies chest pain 2D echocardiogram 2/21 without any wall motion abnormalities and preserved LVEF Cardiology has signed off with plans to follow-up outpatient with   ESRD on HD (MWF) Nephrology following with plans for further dialysis tomorrow   Presumed CAP POA along with influenza A Chest x-ray was suggestive of pneumonia Patient was started on ceftriaxone and azithromycin, we shall continue same at this time with plan to de-escalate/discontinue based on blood culture, sputum culture, urine Legionella, strep pneumo and procalcitonin elevated at 15.82 Tamiflu started Continue Tylenol as needed Continue Mucinex, incentive spirometry, flutter valve    Hypocalcemia Continue to monitor Continue Os-Cal  Worsening anemia-now stable Monitor CBC  Give ESA with hemodialysis Stool occult negative   Hypoalbuminemia possible secondary to mild protein calorie malnutrition Albumin 3.2, protein supplement will be provided   Type 2 diabetes mellitus with  hyperglycemia Hemoglobin A1c at 5.8 Continue ISS and hypoglycemia protocol  DVT prophylaxis: SCDs Code Status: Full Family Communication: None at bedside Disposition Plan:  Status is: Inpatient Remains inpatient appropriate because: Need for IV medications  Consultants:  Cardiology Nephrology  Procedures:  None  Antimicrobials:  Anti-infectives (From admission, onward)    Start     Dose/Rate Route Frequency Ordered Stop   07/15/23 1230  oseltamivir (TAMIFLU) capsule 30 mg        30 mg Oral Every M-W-F (Hemodialysis) 07/15/23 1140 07/27/23 1159   07/15/23 1200  oseltamivir (TAMIFLU) capsule 30 mg  Status:  Discontinued        30 mg Oral Every other day 07/15/23 1110 07/15/23 1116   07/15/23 0300  cefTRIAXone (ROCEPHIN) 1 g in sodium chloride 0.9 % 100 mL IVPB        1 g 200 mL/hr over 30 Minutes Intravenous Every 24 hours 07/15/23 0256     07/15/23 0300  azithromycin (ZITHROMAX) 500 mg in sodium chloride 0.9 % 250 mL IVPB        500 mg 250 mL/hr over 60 Minutes Intravenous Every 24 hours 07/15/23 0256         Subjective: Patient seen and evaluated today with no new acute complaints or concerns. No acute concerns or events noted overnight.  His blood pressure still remains elevated.  Plans for further dialysis in a.m.  He has been weaned off of oxygen.  Objective: Vitals:   07/19/23 0800 07/19/23 0900 07/19/23 0936 07/19/23 1000  BP: (!) 184/83 (!) 168/70 (!) 168/70 (!) 170/63  Pulse: 78  75   Resp: 15 14 12 10   Temp:      TempSrc:      SpO2: 100%     Weight:   81.3 kg   Height:   6' (1.829 m)     Intake/Output Summary (Last 24 hours) at 07/19/2023 1121 Last data filed at 07/19/2023 0809 Gross per 24 hour  Intake 240 ml  Output 4350 ml  Net -4110 ml   Filed Weights   07/18/23 1030 07/18/23 1500 07/19/23 0936  Weight: 82.1 kg 77.9 kg 81.3 kg    Examination:  General exam: Appears calm and comfortable  Respiratory system: Diminished to auscultation  bilaterally. Respiratory effort normal.  Currently on room air. Cardiovascular system: S1 & S2 heard, RRR.  Gastrointestinal system: Abdomen is soft Central nervous system: Alert and awake Extremities: No edema Skin: No significant lesions noted Psychiatry: Flat affect.    Data Reviewed: I have personally reviewed following labs and imaging studies  CBC: Recent Labs  Lab 07/14/23 2042 07/15/23 0119 07/16/23 0501 07/17/23 0308 07/18/23 0334 07/19/23 0454  WBC 13.4* 10.0 5.2 5.6 6.1 4.9  NEUTROABS 12.3*  --   --   --   --   --   HGB 10.0* 9.2* 7.9* 7.5* 8.1* 8.8*  HCT 30.3* 28.1* 24.5* 22.4* 24.9* 26.8*  MCV 96.8 96.2 95.7 94.9 95.4 94.0  PLT 227 196 208 188 192 214   Basic Metabolic Panel: Recent Labs  Lab 07/15/23 0119 07/15/23 0959 07/16/23 0501 07/17/23 0308 07/18/23 0334 07/19/23 0454  NA 136  137  --  133* 131* 129* 134*  K 4.1  4.2  --  3.5 3.8 3.8 3.8  CL 100  99  --  96* 94* 94* 96*  CO2 22  22  --  25 23 20* 24  GLUCOSE 126*  127*  --  103* 94 111* 122*  BUN 59*  58*  --  47* 66* 84* 52*  CREATININE 8.93*  8.96*  --  7.34* 9.90* 11.96* 8.28*  CALCIUM 7.8*  7.9*  --  7.6* 7.2* 7.3* 7.7*  MG 2.1  --  2.0 2.0 2.3 2.1  PHOS 4.6  4.5 5.1*  --   --   --   --    GFR: Estimated Creatinine Clearance: 9.4 mL/min (A) (by C-G formula based on SCr of 8.28 mg/dL (H)). Liver Function Tests: Recent Labs  Lab 07/14/23 2042 07/15/23 0119 07/18/23 0516  AST 21 17  --   ALT 13 12  --   ALKPHOS 55 50  --   BILITOT 1.1 1.1  --   PROT 7.6 7.0  --   ALBUMIN 3.2* 2.9*  2.9* 2.7*   No results for input(s): "LIPASE", "AMYLASE" in the last 168 hours. No results for input(s): "AMMONIA" in the last 168 hours. Coagulation Profile: No results for input(s): "INR", "PROTIME" in the last 168 hours. Cardiac Enzymes: No results for input(s): "CKTOTAL", "CKMB", "CKMBINDEX", "TROPONINI" in the last 168 hours. BNP (last 3 results) No results for input(s): "PROBNP" in  the last 8760 hours. HbA1C: No results for input(s): "HGBA1C" in the last 72 hours. CBG: Recent Labs  Lab 07/14/23 2123 07/15/23 1946  GLUCAP 165* 100*   Lipid Profile: No results for input(s): "CHOL", "HDL", "LDLCALC", "TRIG", "CHOLHDL", "LDLDIRECT" in the last 72 hours.  Thyroid Function Tests: No results for input(s): "TSH", "T4TOTAL", "FREET4", "T3FREE", "THYROIDAB" in the last 72 hours. Anemia Panel: Recent Labs    07/17/23 0308  VITAMINB12 1,164*  FOLATE 9.5  FERRITIN 1,662*  TIBC 126*  IRON 47  RETICCTPCT 0.8   Sepsis Labs: Recent Labs  Lab 07/15/23 0119  PROCALCITON 15.82    Recent Results (from the past 240 hours)  Culture, blood (Routine X 2) w Reflex to ID Panel     Status: None (Preliminary result)   Collection Time: 07/15/23  3:03 AM   Specimen: BLOOD  Result Value Ref Range Status   Specimen Description BLOOD BLOOD RIGHT ARM  Final   Special Requests   Final    BOTTLES DRAWN AEROBIC AND ANAEROBIC Blood Culture adequate volume   Culture   Final    NO GROWTH 4 DAYS Performed at [facility], [address], [place], [place] [address]    Report Status PENDING  Incomplete  Culture, blood (Routine X 2) w Reflex to ID Panel     Status: None (Preliminary result)   Collection Time: 07/15/23  3:05 AM   Specimen: BLOOD  Result Value Ref Range Status   Specimen Description BLOOD BLOOD RIGHT HAND  Final   Special Requests   Final    BOTTLES DRAWN AEROBIC AND ANAEROBIC Blood Culture adequate volume   Culture   Final    NO GROWTH 4 DAYS Performed at [facility], [address], [place], [place] [address]    Report Status PENDING  Incomplete  MRSA Next Gen by PCR, Nasal     Status: None   Collection Time: 07/15/23  8:40 AM   Specimen: Nasal Mucosa; Nasal Swab  Result Value Ref Range Status   MRSA by PCR Next Gen NOT DETECTED NOT DETECTED Final    Comment: (NOTE) The GeneXpert MRSA Assay (FDA approved for NASAL specimens only),  is one component of a  comprehensive MRSA colonization surveillance program. It is not intended to diagnose MRSA infection nor to guide or monitor treatment for MRSA infections. Test performance is not FDA approved in patients less than 70 years old. Performed at [facility], [address] [place], [place] [address]   Resp panel by RT-PCR (RSV, Flu A&B, Covid) Anterior Nasal Swab     Status: Abnormal   Collection Time: 07/15/23  9:21 AM   Specimen: Anterior Nasal Swab  Result Value Ref Range Status   SARS Coronavirus 2 by RT PCR NEGATIVE NEGATIVE Final    Comment: (NOTE) SARS-CoV-2 target nucleic acids are NOT DETECTED.  The SARS-CoV-2 RNA is generally detectable in upper respiratory specimens during the acute phase of infection. The lowest concentration of SARS-CoV-2 viral copies this assay can detect is 138 copies/mL. A negative result does not preclude SARS-Cov-2 infection and should not be used as the sole basis for treatment or other patient management decisions. A negative result may occur with  improper specimen collection/handling, submission of specimen other than nasopharyngeal swab, presence of viral mutation(s) within the areas targeted by this assay, and inadequate number of viral copies(<138 copies/mL). A negative result must be combined with clinical observations, patient history, and epidemiological information. The expected result is Negative.  Fact Sheet for Patients:  BloggerCourse.com  Fact Sheet for Healthcare Providers:  SeriousBroker.it  This test is no t yet approved or cleared by the [place] FDA and  has been authorized for detection and/or diagnosis of SARS-CoV-2 by FDA under an Emergency Use Authorization (EUA). This EUA will remain  in effect (meaning this test can be used) for the duration of the COVID-19 declaration under Section 564(b)(1) of the Act, 21 U.S.C.section 360bbb-3(b)(1), unless the authorization is  terminated  or revoked sooner.       Influenza A by PCR POSITIVE (A) NEGATIVE Final   Influenza B by PCR NEGATIVE NEGATIVE Final    Comment: (NOTE) The Xpert Xpress SARS-CoV-2/FLU/RSV plus assay is intended as an aid in the diagnosis of influenza from Nasopharyngeal swab specimens and should not be used as a sole basis for treatment. Nasal washings and aspirates are unacceptable for Xpert Xpress SARS-CoV-2/FLU/RSV testing.  Fact Sheet for Patients: BloggerCourse.com  Fact Sheet for Healthcare Providers: SeriousBroker.it  This test is not yet approved or cleared by the [place] FDA and has been authorized for detection and/or diagnosis of SARS-CoV-2 by FDA under an Emergency Use Authorization (EUA). This EUA will remain in effect (meaning this test can be used) for the duration of the COVID-19 declaration under Section 564(b)(1) of the Act, 21 U.S.C. section 360bbb-3(b)(1), unless the authorization is terminated or revoked.     Resp Syncytial Virus by PCR NEGATIVE NEGATIVE Final    Comment: (NOTE) Fact Sheet for Patients: BloggerCourse.com  Fact Sheet for Healthcare Providers: SeriousBroker.it  This test is not yet approved or cleared by the [place] FDA and has been authorized for detection and/or diagnosis of SARS-CoV-2 by FDA under an Emergency Use Authorization (EUA). This EUA will remain in effect (meaning this test can be used) for the duration of the COVID-19 declaration under Section 564(b)(1) of the Act, 21 U.S.C. section 360bbb-3(b)(1), unless the authorization is terminated or revoked.  Performed at [facility], [address] [place], [place] [address]          Radiology Studies: No results found.       Scheduled Meds:  amLODipine  10 mg Oral Daily  calcium carbonate  1 tablet Oral Q breakfast   Chlorhexidine Gluconate Cloth  6 each  Topical Q0600   darbepoetin (ARANESP) injection - DIALYSIS  100 mcg Subcutaneous Q Mon-1800   dextromethorphan-guaiFENesin  1 tablet Oral BID   feeding supplement (GLUCERNA SHAKE)  237 mL Oral TID BM   hydrALAZINE  200 mg Oral TID   irbesartan  300 mg Oral Daily   labetalol  400 mg Oral BID   oseltamivir  30 mg Oral Q M,W,F-HD   oxymetazoline  1 spray Each Nare BID   rosuvastatin  10 mg Oral Daily   Continuous Infusions:  azithromycin 500 mg (07/19/23 0359)   cefTRIAXone (ROCEPHIN)  IV 1 g (07/19/23 0258)   nitroGLYCERIN Stopped (07/17/23 1505)     LOS: 5 days    Total care time spent: 55 minutes    Maxton Noreen D Sherryll Burger, DO [facility]  If 7PM-7AM, please contact night-coverage [email or web address] 07/19/2023, 11:21 AM

## 2023-07-20 DIAGNOSIS — I5043 Acute on chronic combined systolic (congestive) and diastolic (congestive) heart failure: Secondary | ICD-10-CM | POA: Diagnosis not present

## 2023-07-20 LAB — RENAL FUNCTION PANEL
Albumin: 2.5 g/dL — ABNORMAL LOW (ref 3.5–5.0)
Anion gap: 17 — ABNORMAL HIGH (ref 5–15)
BUN: 62 mg/dL — ABNORMAL HIGH (ref 8–23)
CO2: 22 mmol/L (ref 22–32)
Calcium: 7.7 mg/dL — ABNORMAL LOW (ref 8.9–10.3)
Chloride: 92 mmol/L — ABNORMAL LOW (ref 98–111)
Creatinine, Ser: 10.91 mg/dL — ABNORMAL HIGH (ref 0.61–1.24)
GFR, Estimated: 5 mL/min — ABNORMAL LOW (ref >60)
Glucose, Bld: 131 mg/dL — ABNORMAL HIGH (ref 70–99)
Phosphorus: 4.9 mg/dL — ABNORMAL HIGH (ref 2.5–4.6)
Potassium: 3.7 mmol/L (ref 3.5–5.1)
Sodium: 131 mmol/L — ABNORMAL LOW (ref 135–145)

## 2023-07-20 LAB — BASIC METABOLIC PANEL
Anion gap: 16 — ABNORMAL HIGH (ref 5–15)
BUN: 60 mg/dL — ABNORMAL HIGH (ref 8–23)
CO2: 24 mmol/L (ref 22–32)
Calcium: 7.7 mg/dL — ABNORMAL LOW (ref 8.9–10.3)
Chloride: 93 mmol/L — ABNORMAL LOW (ref 98–111)
Creatinine, Ser: 10.28 mg/dL — ABNORMAL HIGH (ref 0.61–1.24)
GFR, Estimated: 5 mL/min — ABNORMAL LOW (ref >60)
Glucose, Bld: 125 mg/dL — ABNORMAL HIGH (ref 70–99)
Potassium: 3.6 mmol/L (ref 3.5–5.1)
Sodium: 133 mmol/L — ABNORMAL LOW (ref 135–145)

## 2023-07-20 LAB — CBC
HCT: 24.7 % — ABNORMAL LOW (ref 39.0–52.0)
Hemoglobin: 8.2 g/dL — ABNORMAL LOW (ref 13.0–17.0)
MCH: 31.2 pg (ref 26.0–34.0)
MCHC: 33.2 g/dL (ref 30.0–36.0)
MCV: 93.9 fL (ref 80.0–100.0)
Platelets: 208 10*3/uL (ref 150–400)
RBC: 2.63 MIL/uL — ABNORMAL LOW (ref 4.22–5.81)
RDW: 14.2 % (ref 11.5–15.5)
WBC: 5.1 10*3/uL (ref 4.0–10.5)
nRBC: 0 % (ref 0.0–0.2)

## 2023-07-20 LAB — CULTURE, BLOOD (ROUTINE X 2)
Culture: NO GROWTH
Culture: NO GROWTH
Special Requests: ADEQUATE
Special Requests: ADEQUATE

## 2023-07-20 LAB — MAGNESIUM: Magnesium: 2.1 mg/dL (ref 1.7–2.4)

## 2023-07-20 MED ORDER — HEPARIN SODIUM (PORCINE) 1000 UNIT/ML DIALYSIS
40.0000 [IU]/kg | INTRAMUSCULAR | Status: DC | PRN
  Administered 2023-07-20: 3100 [IU] via INTRAVENOUS_CENTRAL

## 2023-07-20 MED ORDER — DIPHENHYDRAMINE HCL 25 MG PO CAPS
25.0000 mg | ORAL_CAPSULE | Freq: Every day | ORAL | Status: DC
  Administered 2023-07-20: 25 mg via ORAL
  Filled 2023-07-20: qty 1

## 2023-07-20 NOTE — Progress Notes (Signed)
 Patient ID: Matthew Robles, male   DOB: January 07, 1956, 68 y.o.   MRN: [id number] [facility] Progress Note   Assessment/ Plan:   1.  Hypertensive emergency/NSTEMI: Blood pressures remain elevated even after up titration of irbesartan and earlier aggressive ultrafiltration with dialysis.  The plan is to undertake hemodialysis again with efforts at lowering dry weight/volume unloading.  I will reassess antihypertensive therapy postdialysis. 2. ESRD: Continue hemodialysis on MWF schedule with patient on schedule for dialysis today. 3. Anemia: Low hemoglobin/hematocrit without overt loss, suspect ESA resistance in the setting of infection/influenza.  Monitor on ESA. 4. CKD-MBD: Calcium level marginally low but corrected for albumin.  Phosphorus level currently at goal.  Resume/continue renal diet and binders. 5.  Influenza A infection: Ongoing treatment with oseltamivir and empiric coverage for CAP with ceftriaxone/azithromycin.  Subjective:   Reports to be feeling better, complains of problems with staff overnight.   Objective:   BP (!) 177/142 (BP Location: Right Arm)   Pulse 78   Temp 98.2 F (36.8 C) (Oral)   Resp 16   Ht 6' (1.829 m)   Wt 79 kg   SpO2 91%   BMI 23.62 kg/m   Physical Exam: Gen: Resting comfortably in bed, eating breakfast CVS: Pulse regular rhythm, normal rate, S1 and S2 normal Resp: Coarse rales bilaterally, no distinct rhonchi/wheeze Abd: Soft, flat, nontender, bowel sounds normal Ext: No lower extremity edema, left upper arm AV fistula with intact dressing  Labs: BMET Recent Labs  Lab 07/14/23 2042 07/15/23 0119 07/15/23 0959 07/16/23 0501 07/17/23 0308 07/18/23 0334 07/19/23 0454 07/20/23 0337  NA 133* 136  137  --  133* 131* 129* 134* 133*  K 4.2 4.1  4.2  --  3.5 3.8 3.8 3.8 3.6  CL 99 100  99  --  96* 94* 94* 96* 93*  CO2 19* 22  22  --  25 23 20* 24 24  GLUCOSE 174* 126*  127*  --  103* 94 111* 122* 125*  BUN 53* 59*  58*  --   47* 66* 84* 52* 60*  CREATININE 8.14* 8.93*  8.96*  --  7.34* 9.90* 11.96* 8.28* 10.28*  CALCIUM 7.7* 7.8*  7.9*  --  7.6* 7.2* 7.3* 7.7* 7.7*  PHOS  --  4.6  4.5 5.1*  --   --   --   --   --    CBC Recent Labs  Lab 07/14/23 2042 07/15/23 0119 07/17/23 0308 07/18/23 0334 07/19/23 0454 07/20/23 0337  WBC 13.4*   < > 5.6 6.1 4.9 5.1  NEUTROABS 12.3*  --   --   --   --   --   HGB 10.0*   < > 7.5* 8.1* 8.8* 8.2*  HCT 30.3*   < > 22.4* 24.9* 26.8* 24.7*  MCV 96.8   < > 94.9 95.4 94.0 93.9  PLT 227   < > 188 192 214 208   < > = values in this interval not displayed.      Medications:     amLODipine  10 mg Oral Daily   calcium carbonate  1 tablet Oral Q breakfast   Chlorhexidine Gluconate Cloth  6 each Topical Q0600   darbepoetin (ARANESP) injection - DIALYSIS  100 mcg Subcutaneous Q Mon-1800   dextromethorphan-guaiFENesin  1 tablet Oral BID   feeding supplement (GLUCERNA SHAKE)  237 mL Oral TID BM   hydrALAZINE  200 mg Oral TID   irbesartan  300 mg Oral Daily  labetalol  400 mg Oral BID   oseltamivir  30 mg Oral Q M,W,F-HD   rosuvastatin  10 mg Oral Daily   Zetta Bills, MD 07/20/2023, 8:04 AM

## 2023-07-20 NOTE — Progress Notes (Signed)
 PT Progress Note.    07/20/23 1058  PT Visit Information  Last PT Received On 07/20/23  Reason Eval/Treat Not Completed Patient at procedure or test/unavailable (Pt not in room. Will try back later this pm permitting schedule and pt status.)   Nelida Meuse PT, DPT [facility] [phone] office

## 2023-07-20 NOTE — Progress Notes (Signed)
 Bipap order is PRN; patient is on RA at 100%; no Bipap needed at this time.

## 2023-07-20 NOTE — Plan of Care (Signed)
   Problem: Education: Goal: Knowledge of General Education information will improve Description: Including pain rating scale, medication(s)/side effects and non-pharmacologic comfort measures Outcome: Progressing   Problem: Clinical Measurements: Goal: Will remain free from infection Outcome: Progressing   Problem: Activity: Goal: Risk for activity intolerance will decrease Outcome: Progressing

## 2023-07-20 NOTE — Plan of Care (Signed)

## 2023-07-20 NOTE — Progress Notes (Signed)
 PROGRESS NOTE    Matthew Robles  JYN:[id number] DOB: [birth year] DOA: 07/14/2023 PCP: Elmer Picker, [facility]   Brief Narrative:    Matthew Robles is a 68 y.o. male with medical history significant of hypertension, end-stage renal disease on hemodialysis (MWF), type 2 diabetes mellitus, history of stroke who presents to the emergency department from home via EMS due to increased work of breathing which started about 1 hour prior to arrival to the ED.  EMS was activated and on arrival of EMS team, O2 sat was noted to be 61% on home oxygen (patient states that he uses supplemental oxygen only as needed at home).  He was placed on NRB with O2 sat increasing to 73%.  O2 sat increased to 91% on CPAP per report.  Patient was admitted with acute hypoxemic respiratory failure secondary to acute on chronic HFmrEF with hypertensive emergency and is also noted to have elevated troponin levels.  He is a dialysis patient and nephrology plans to hemodialyzed and he has been started on nitroglycerin drip which has now been weaned off.  He is also noted to be positive for influenza A.  He continues to have some uncontrolled blood pressure readings this morning despite adjustment to antihypertensive medications yesterday.  Plans for further hemodialysis today.  Assessment & Plan:   Principal Problem:   Acute on chronic combined systolic and diastolic CHF (congestive heart failure) (HCC) Active Problems:   ESRD on hemodialysis (HCC)   Acute respiratory failure with hypoxia (HCC)   Hypoalbuminemia due to protein-calorie malnutrition (HCC)   Elevated troponin   Essential hypertension   CAP (community acquired pneumonia)   Hypocalcemia   Type 2 diabetes mellitus with hyperglycemia (HCC)  Assessment and Plan:   Acute hypoxemic respiratory failure secondary to acute on chronic combined systolic and diastolic CHF Chest x-ray was suggestive of pulmonary edema BNP was 3,242 (this was 2781 on  03/27/2023) Continue total input/output, daily weights and fluid restriction Currently weaned off of BiPAP and now on nasal cannula, wean to room air as tolerated Continue heart healthy diet      Echocardiogram done in November 2024 showed LVEF of 45 to 50%.  LV demonstrates global hypokinesis.  Moderate LVH.  G3 DD.  Echocardiogram ordered and pending Continue hemodialysis further plan 2/26  Hypertensive emergency-resolved, still uncontrolled Weaned off of nitroglycerin drip Continue Norvasc 10 mg daily and hydralazine increased to 200 mg 3 times daily previously Started irbesartan 75 mg on 2/24 per nephrology, increased to full dose 300 mg on 2/25 Resumed home labetalol 2/22 Adjust blood pressure medications after further hemodialysis today   Elevated troponin possibly due to type II demand ischemia Heparin drip discontinued Patient denies chest pain 2D echocardiogram 2/21 without any wall motion abnormalities and preserved LVEF Cardiology has signed off with plans to follow-up outpatient with   ESRD on HD (MWF) Nephrology following with plans for further dialysis today   Presumed CAP POA along with influenza A Chest x-ray was suggestive of pneumonia Discontinue Rocephin and azithromycin after 5 days of treatment pneumo and procalcitonin elevated at 15.82 Tamiflu started Continue Tylenol as needed Continue Mucinex, incentive spirometry, flutter valve    Hypocalcemia Continue to monitor Continue Os-Cal  Worsening anemia-now stable Monitor CBC  Give ESA with hemodialysis Stool occult negative   Hypoalbuminemia possible secondary to mild protein calorie malnutrition Albumin 3.2, protein supplement will be provided   Type 2 diabetes mellitus with hyperglycemia Hemoglobin A1c at 5.8 Continue ISS and hypoglycemia protocol  DVT prophylaxis:  SCDs Code Status: Full Family Communication: None at bedside Disposition Plan:  Status is: Inpatient Remains inpatient appropriate  because: Need for IV medications  Consultants:  Cardiology signed off Nephrology  Procedures:  None  Antimicrobials:  Anti-infectives (From admission, onward)    Start     Dose/Rate Route Frequency Ordered Stop   07/15/23 1230  oseltamivir (TAMIFLU) capsule 30 mg        30 mg Oral Every M-W-F (Hemodialysis) 07/15/23 1140 07/27/23 1159   07/15/23 1200  oseltamivir (TAMIFLU) capsule 30 mg  Status:  Discontinued        30 mg Oral Every other day 07/15/23 1110 07/15/23 1116   07/15/23 0300  cefTRIAXone (ROCEPHIN) 1 g in sodium chloride 0.9 % 100 mL IVPB  Status:  Discontinued        1 g 200 mL/hr over 30 Minutes Intravenous Every 24 hours 07/15/23 0256 07/20/23 0926   07/15/23 0300  azithromycin (ZITHROMAX) 500 mg in sodium chloride 0.9 % 250 mL IVPB  Status:  Discontinued        500 mg 250 mL/hr over 60 Minutes Intravenous Every 24 hours 07/15/23 0256 07/20/23 0926       Subjective: Patient seen and evaluated today with no new acute complaints or concerns. No acute concerns or events noted overnight.  His blood pressure still remains elevated.  Plan is for further dialysis today.  Objective: Vitals:   07/19/23 2220 07/20/23 0309 07/20/23 0341 07/20/23 0839  BP: (!) 153/76 (!) 177/142  (!) 173/80  Pulse: 75 78  71  Resp: 17 16  18   Temp: 97.6 F (36.4 C) 98.2 F (36.8 C)    TempSrc: Oral Oral    SpO2: 96% 91%    Weight:   79 kg   Height:        Intake/Output Summary (Last 24 hours) at 07/20/2023 0930 Last data filed at 07/20/2023 0538 Gross per 24 hour  Intake 1302.01 ml  Output --  Net 1302.01 ml   Filed Weights   07/18/23 1500 07/19/23 0936 07/20/23 0341  Weight: 77.9 kg 81.3 kg 79 kg    Examination:  General exam: Appears calm and comfortable  Respiratory system: Diminished to auscultation bilaterally. Respiratory effort normal.  Currently on room air. Cardiovascular system: S1 & S2 heard, RRR.  Gastrointestinal system: Abdomen is soft Central nervous  system: Alert and awake Extremities: No edema Skin: No significant lesions noted Psychiatry: Flat affect.    Data Reviewed: I have personally reviewed following labs and imaging studies  CBC: Recent Labs  Lab 07/14/23 2042 07/15/23 0119 07/16/23 0501 07/17/23 0308 07/18/23 0334 07/19/23 0454 07/20/23 0337  WBC 13.4*   < > 5.2 5.6 6.1 4.9 5.1  NEUTROABS 12.3*  --   --   --   --   --   --   HGB 10.0*   < > 7.9* 7.5* 8.1* 8.8* 8.2*  HCT 30.3*   < > 24.5* 22.4* 24.9* 26.8* 24.7*  MCV 96.8   < > 95.7 94.9 95.4 94.0 93.9  PLT 227   < > 208 188 192 214 208   < > = values in this interval not displayed.   Basic Metabolic Panel: Recent Labs  Lab 07/15/23 0119 07/15/23 0959 07/16/23 0501 07/17/23 0308 07/18/23 0334 07/19/23 0454 07/20/23 0337  NA 136  137  --  133* 131* 129* 134* 133*  K 4.1  4.2  --  3.5 3.8 3.8 3.8 3.6  CL 100  99  --  96* 94* 94* 96* 93*  CO2 22  22  --  25 23 20* 24 24  GLUCOSE 126*  127*  --  103* 94 111* 122* 125*  BUN 59*  58*  --  47* 66* 84* 52* 60*  CREATININE 8.93*  8.96*  --  7.34* 9.90* 11.96* 8.28* 10.28*  CALCIUM 7.8*  7.9*  --  7.6* 7.2* 7.3* 7.7* 7.7*  MG 2.1  --  2.0 2.0 2.3 2.1 2.1  PHOS 4.6  4.5 5.1*  --   --   --   --   --    GFR: Estimated Creatinine Clearance: 7.5 mL/min (A) (by C-G formula based on SCr of 10.28 mg/dL (H)). Liver Function Tests: Recent Labs  Lab 07/14/23 2042 07/15/23 0119 07/18/23 0516  AST 21 17  --   ALT 13 12  --   ALKPHOS 55 50  --   BILITOT 1.1 1.1  --   PROT 7.6 7.0  --   ALBUMIN 3.2* 2.9*  2.9* 2.7*   No results for input(s): "LIPASE", "AMYLASE" in the last 168 hours. No results for input(s): "AMMONIA" in the last 168 hours. Coagulation Profile: No results for input(s): "INR", "PROTIME" in the last 168 hours. Cardiac Enzymes: No results for input(s): "CKTOTAL", "CKMB", "CKMBINDEX", "TROPONINI" in the last 168 hours. BNP (last 3 results) No results for input(s): "PROBNP" in the last  8760 hours. HbA1C: No results for input(s): "HGBA1C" in the last 72 hours. CBG: Recent Labs  Lab 07/14/23 2123 07/15/23 1946  GLUCAP 165* 100*   Lipid Profile: No results for input(s): "CHOL", "HDL", "LDLCALC", "TRIG", "CHOLHDL", "LDLDIRECT" in the last 72 hours.  Thyroid Function Tests: No results for input(s): "TSH", "T4TOTAL", "FREET4", "T3FREE", "THYROIDAB" in the last 72 hours. Anemia Panel: No results for input(s): "VITAMINB12", "FOLATE", "FERRITIN", "TIBC", "IRON", "RETICCTPCT" in the last 72 hours.  Sepsis Labs: Recent Labs  Lab 07/15/23 0119  PROCALCITON 15.82    Recent Results (from the past 240 hours)  Culture, blood (Routine X 2) w Reflex to ID Panel     Status: None   Collection Time: 07/15/23  3:03 AM   Specimen: BLOOD  Result Value Ref Range Status   Specimen Description BLOOD BLOOD RIGHT ARM  Final   Special Requests   Final    BOTTLES DRAWN AEROBIC AND ANAEROBIC Blood Culture adequate volume   Culture   Final    NO GROWTH 5 DAYS Performed at [facility], [address], [place], [place] [address]    Report Status 07/20/2023 FINAL  Final  Culture, blood (Routine X 2) w Reflex to ID Panel     Status: None   Collection Time: 07/15/23  3:05 AM   Specimen: BLOOD  Result Value Ref Range Status   Specimen Description BLOOD BLOOD RIGHT HAND  Final   Special Requests   Final    BOTTLES DRAWN AEROBIC AND ANAEROBIC Blood Culture adequate volume   Culture   Final    NO GROWTH 5 DAYS Performed at [facility], [address] [place], [place] [address]    Report Status 07/20/2023 FINAL  Final  MRSA Next Gen by PCR, Nasal     Status: None   Collection Time: 07/15/23  8:40 AM   Specimen: Nasal Mucosa; Nasal Swab  Result Value Ref Range Status   MRSA by PCR Next Gen NOT DETECTED NOT DETECTED Final    Comment: (NOTE) The GeneXpert MRSA Assay (FDA approved for NASAL specimens only), is one component  of a comprehensive MRSA colonization surveillance program. It  is not intended to diagnose MRSA infection nor to guide or monitor treatment for MRSA infections. Test performance is not FDA approved in patients less than 74 years old. Performed at [facility], [address], [place], [place] [address]   Resp panel by RT-PCR (RSV, Flu A&B, Covid) Anterior Nasal Swab     Status: Abnormal   Collection Time: 07/15/23  9:21 AM   Specimen: Anterior Nasal Swab  Result Value Ref Range Status   SARS Coronavirus 2 by RT PCR NEGATIVE NEGATIVE Final    Comment: (NOTE) SARS-CoV-2 target nucleic acids are NOT DETECTED.  The SARS-CoV-2 RNA is generally detectable in upper respiratory specimens during the acute phase of infection. The lowest concentration of SARS-CoV-2 viral copies this assay can detect is 138 copies/mL. A negative result does not preclude SARS-Cov-2 infection and should not be used as the sole basis for treatment or other patient management decisions. A negative result may occur with  improper specimen collection/handling, submission of specimen other than nasopharyngeal swab, presence of viral mutation(s) within the areas targeted by this assay, and inadequate number of viral copies(<138 copies/mL). A negative result must be combined with clinical observations, patient history, and epidemiological information. The expected result is Negative.  Fact Sheet for Patients:  BloggerCourse.com  Fact Sheet for Healthcare Providers:  SeriousBroker.it  This test is no t yet approved or cleared by the [place] FDA and  has been authorized for detection and/or diagnosis of SARS-CoV-2 by FDA under an Emergency Use Authorization (EUA). This EUA will remain  in effect (meaning this test can be used) for the duration of the COVID-19 declaration under Section 564(b)(1) of the Act, 21 U.S.C.section 360bbb-3(b)(1), unless the authorization is terminated  or revoked sooner.       Influenza A by PCR  POSITIVE (A) NEGATIVE Final   Influenza B by PCR NEGATIVE NEGATIVE Final    Comment: (NOTE) The Xpert Xpress SARS-CoV-2/FLU/RSV plus assay is intended as an aid in the diagnosis of influenza from Nasopharyngeal swab specimens and should not be used as a sole basis for treatment. Nasal washings and aspirates are unacceptable for Xpert Xpress SARS-CoV-2/FLU/RSV testing.  Fact Sheet for Patients: BloggerCourse.com  Fact Sheet for Healthcare Providers: SeriousBroker.it  This test is not yet approved or cleared by the [place] FDA and has been authorized for detection and/or diagnosis of SARS-CoV-2 by FDA under an Emergency Use Authorization (EUA). This EUA will remain in effect (meaning this test can be used) for the duration of the COVID-19 declaration under Section 564(b)(1) of the Act, 21 U.S.C. section 360bbb-3(b)(1), unless the authorization is terminated or revoked.     Resp Syncytial Virus by PCR NEGATIVE NEGATIVE Final    Comment: (NOTE) Fact Sheet for Patients: BloggerCourse.com  Fact Sheet for Healthcare Providers: SeriousBroker.it  This test is not yet approved or cleared by the [place] FDA and has been authorized for detection and/or diagnosis of SARS-CoV-2 by FDA under an Emergency Use Authorization (EUA). This EUA will remain in effect (meaning this test can be used) for the duration of the COVID-19 declaration under Section 564(b)(1) of the Act, 21 U.S.C. section 360bbb-3(b)(1), unless the authorization is terminated or revoked.  Performed at [facility], [address] [place], [place] [address]          Radiology Studies: No results found.       Scheduled Meds:  amLODipine  10 mg Oral Daily   calcium  carbonate  1 tablet Oral Q breakfast   Chlorhexidine Gluconate Cloth  6 each Topical Q0600   darbepoetin (ARANESP) injection - DIALYSIS   100 mcg Subcutaneous Q Mon-1800   dextromethorphan-guaiFENesin  1 tablet Oral BID   feeding supplement (GLUCERNA SHAKE)  237 mL Oral TID BM   hydrALAZINE  200 mg Oral TID   irbesartan  300 mg Oral Daily   labetalol  400 mg Oral BID   oseltamivir  30 mg Oral Q M,W,F-HD   rosuvastatin  10 mg Oral Daily   Continuous Infusions:  nitroGLYCERIN Stopped (07/17/23 1505)     LOS: 6 days    Total care time spent: 55 minutes    Derika Eckles D Sherryll Burger, DO [facility]  If 7PM-7AM, please contact night-coverage [email or web address] 07/20/2023, 9:30 AM

## 2023-07-20 NOTE — Progress Notes (Signed)
   07/20/23 1417  Vitals  Temp 98.7 F (37.1 C)  Pulse Rate 70  Resp 12  BP (!) 169/72  SpO2 100 %  O2 Device Room Air  Oxygen Therapy  Patient Activity (if Appropriate) In bed  Pulse Oximetry Type Continuous  Oximetry Probe Site Changed No  During Treatment Monitoring  Blood Flow Rate (mL/min) 0 mL/min  Arterial Pressure (mmHg) -41.61 mmHg  Venous Pressure (mmHg) 59.79 mmHg  TMP (mmHg) -27.88 mmHg  Ultrafiltration Rate (mL/min) 1016 mL/min  Dialysate Flow Rate (mL/min) 300 ml/min  Dialysate Potassium Concentration 3  Dialysate Calcium Concentration 2.5  Duration of HD Treatment -hour(s) 3.5 hour(s)  Cumulative Fluid Removed (mL) per Treatment  3000.14  HD Safety Checks Performed Yes  Intra-Hemodialysis Comments Tx completed   Received patient in bed to unit.  Alert and oriented.  Informed consent signed and in chart.   TX duration: 3.5 hours  Patient tolerated well.  Transported back to the room  Alert, without acute distress.  Hand-off given to patient's nurse.   Access used: LAVF Access issues: None  Total UF removed: 3000 Medication(s) given: See Wanda Plump, LPN  [facility]

## 2023-07-20 NOTE — Procedures (Signed)
 HD Note:  Some information was entered later than the data was gathered due to patient care needs. The stated time with the data is accurate.  Received patient in bed to unit.   Alert and oriented.   Informed consent signed and in chart.   Access used: Left upper AVF Access issues: No issues  Patient O2 sats dropped into the upper 80s/lower 90s once he fell asleep.  Oxygen at 3L/m applied for the duration of the treatment. Patient sats after treatment were in the upper 90s.  Oxygen was stopped.  TX duration: 3.5 hours  Alert, without acute distress.  Total UF removed: 3000 ml  Hand-off given to patient's nurse.   Transported back to the room   Danyon Mcginness L. Dareen Piano, RN [facility].

## 2023-07-21 DIAGNOSIS — I5043 Acute on chronic combined systolic (congestive) and diastolic (congestive) heart failure: Secondary | ICD-10-CM | POA: Diagnosis not present

## 2023-07-21 LAB — CBC
HCT: 28 % — ABNORMAL LOW (ref 39.0–52.0)
Hemoglobin: 9.1 g/dL — ABNORMAL LOW (ref 13.0–17.0)
MCH: 30.7 pg (ref 26.0–34.0)
MCHC: 32.5 g/dL (ref 30.0–36.0)
MCV: 94.6 fL (ref 80.0–100.0)
Platelets: 212 10*3/uL (ref 150–400)
RBC: 2.96 MIL/uL — ABNORMAL LOW (ref 4.22–5.81)
RDW: 13.9 % (ref 11.5–15.5)
WBC: 6.7 10*3/uL (ref 4.0–10.5)
nRBC: 0 % (ref 0.0–0.2)

## 2023-07-21 LAB — BASIC METABOLIC PANEL
Anion gap: 10 (ref 5–15)
BUN: 40 mg/dL — ABNORMAL HIGH (ref 8–23)
CO2: 26 mmol/L (ref 22–32)
Calcium: 7.9 mg/dL — ABNORMAL LOW (ref 8.9–10.3)
Chloride: 97 mmol/L — ABNORMAL LOW (ref 98–111)
Creatinine, Ser: 7.55 mg/dL — ABNORMAL HIGH (ref 0.61–1.24)
GFR, Estimated: 7 mL/min — ABNORMAL LOW (ref >60)
Glucose, Bld: 97 mg/dL (ref 70–99)
Potassium: 4 mmol/L (ref 3.5–5.1)
Sodium: 133 mmol/L — ABNORMAL LOW (ref 135–145)

## 2023-07-21 LAB — MAGNESIUM: Magnesium: 2.2 mg/dL (ref 1.7–2.4)

## 2023-07-21 MED ORDER — GLUCERNA SHAKE PO LIQD: 237.0000 mL | Freq: Three times a day (TID) | ORAL | 0 refills | Status: DC

## 2023-07-21 MED ORDER — HYDRALAZINE HCL 100 MG PO TABS: 200.0000 mg | ORAL_TABLET | Freq: Three times a day (TID) | ORAL | 1 refills | Status: DC

## 2023-07-21 MED ORDER — ROSUVASTATIN CALCIUM 10 MG PO TABS: 10.0000 mg | ORAL_TABLET | Freq: Every day | ORAL | 1 refills | Status: DC

## 2023-07-21 MED ORDER — IRBESARTAN 300 MG PO TABS: 300.0000 mg | ORAL_TABLET | Freq: Every day | ORAL | 2 refills | Status: DC

## 2023-07-21 NOTE — Plan of Care (Signed)
  Problem: Activity: Goal: Risk for activity intolerance will decrease Outcome: Progressing   Problem: Elimination: Goal: Will not experience complications related to bowel motility Outcome: Progressing   Problem: Safety: Goal: Ability to remain free from injury will improve Outcome: Progressing   

## 2023-07-21 NOTE — Care Management Important Message (Signed)
 Important Message  Patient Details  Name: Matthew Robles MRN: [id number] Date of Birth: 1955-12-14   Important Message Given:  Yes - Medicare IM (late entry, copy provided)     Corey Harold 07/21/2023, 11:41 AM

## 2023-07-21 NOTE — Progress Notes (Signed)
 Patient ID: Matthew Robles, male   DOB: 1956/04/12, 68 y.o.   MRN: [id number] [facility] Progress Note   Assessment/ Plan:   1.  Hypertensive emergency/NSTEMI: Blood pressures remain elevated even after up titration of irbesartan and earlier aggressive ultrafiltration with dialysis; so on amlodipine 10 mg, hydralazine 200 mg 3 times a day, labetalol 400 mg twice daily.  Interestingly he tolerated 3 L of net UF on Wednesday suggesting there may be a component of hypovolemia driving the hypertension.  He thinks being in hospital may be driving his blood pressure up, very poor sleep, stress. 2. ESRD: Continue hemodialysis on MWF schedule; patient tolerated dialysis on 2/26 with 3 L net UF.  Next dialysis tomorrow if he is still here.  Patient insisting on going home.. 3. Anemia: Low hemoglobin/hematocrit without overt loss, suspect ESA resistance in the setting of infection/influenza.  Monitor on ESA. 4. CKD-MBD: Calcium level marginally low but corrected for albumin.  Phosphorus level currently at goal.  Resume/continue renal diet and binders. 5.  Influenza A infection: Ongoing treatment with oseltamivir and empiric coverage for CAP with ceftriaxone/azithromycin.  Subjective:   Reports to be feeling better, complains of problems with sleep with noise in the hallway, stress of being in the hospital, poor food.  Patient thinks all of these are contributing to his blood pressure being high.  He denies any shortness of breath, chest pain, cough, dizziness.  Patient did not have any cramping on dialysis yesterday despite having 3 L removed.     Objective:   BP (!) 165/65   Pulse 73   Temp 98.4 F (36.9 C)   Resp 16   Ht 6' (1.829 m)   Wt 75.8 kg   SpO2 92%   BMI 22.66 kg/m   Physical Exam: Gen: Ambulating in the room to the restroom comfortably  CVS: Pulse regular rhythm, normal rate, S1 and S2 normal Resp: Coarse rales bilaterally, no distinct rhonchi/wheeze Abd: Soft, flat,  nontender, bowel sounds normal Ext: No lower extremity edema, left upper arm AV fistula with intact dressing  Labs: BMET Recent Labs  Lab 07/15/23 0119 07/15/23 0959 07/16/23 0501 07/17/23 0308 07/18/23 0334 07/19/23 0454 07/20/23 0337 07/20/23 1028 07/21/23 0346  NA 136  137  --  133* 131* 129* 134* 133* 131* 133*  K 4.1  4.2  --  3.5 3.8 3.8 3.8 3.6 3.7 4.0  CL 100  99  --  96* 94* 94* 96* 93* 92* 97*  CO2 22  22  --  25 23 20* 24 24 22 26   GLUCOSE 126*  127*  --  103* 94 111* 122* 125* 131* 97  BUN 59*  58*  --  47* 66* 84* 52* 60* 62* 40*  CREATININE 8.93*  8.96*  --  7.34* 9.90* 11.96* 8.28* 10.28* 10.91* 7.55*  CALCIUM 7.8*  7.9*  --  7.6* 7.2* 7.3* 7.7* 7.7* 7.7* 7.9*  PHOS 4.6  4.5 5.1*  --   --   --   --   --  4.9*  --    CBC Recent Labs  Lab 07/14/23 2042 07/15/23 0119 07/18/23 0334 07/19/23 0454 07/20/23 0337 07/21/23 0346  WBC 13.4*   < > 6.1 4.9 5.1 6.7  NEUTROABS 12.3*  --   --   --   --   --   HGB 10.0*   < > 8.1* 8.8* 8.2* 9.1*  HCT 30.3*   < > 24.9* 26.8* 24.7* 28.0*  MCV 96.8   < >  95.4 94.0 93.9 94.6  PLT 227   < > 192 214 208 212   < > = values in this interval not displayed.      Medications:     amLODipine  10 mg Oral Daily   calcium carbonate  1 tablet Oral Q breakfast   Chlorhexidine Gluconate Cloth  6 each Topical Q0600   darbepoetin (ARANESP) injection - DIALYSIS  100 mcg Subcutaneous Q Mon-1800   dextromethorphan-guaiFENesin  1 tablet Oral BID   diphenhydrAMINE  25 mg Oral QHS   feeding supplement (GLUCERNA SHAKE)  237 mL Oral TID BM   hydrALAZINE  200 mg Oral TID   irbesartan  300 mg Oral Daily   labetalol  400 mg Oral BID   rosuvastatin  10 mg Oral Daily

## 2023-07-21 NOTE — Discharge Summary (Signed)
 Physician Discharge Summary  [name]:[id number] DOB: [birth year] DOA: 07/14/2023  PCP: [facility]  Admit date: 07/14/2023  Discharge date: 07/21/2023  Admitted From: Home  Disposition: Home  Recommendations for Outpatient Follow-up:  Follow up with PCP in 1-2 weeks Please continue hemodialysis as scheduled previously Seen blood pressure medications as noted below and continue on medications as ordered with significant alterations made Remains at high risk for readmission as it is unclear how compliant he will be on these medications.  He argues that he has too many side effects  Home Health: None  Equipment/Devices: None  Discharge Condition:Stable  CODE STATUS: Full  Diet recommendation: Renal/carb modified  Brief/Interim Summary: Matthew Robles is a 68 y.o. male with medical history significant of hypertension, end-stage renal disease on hemodialysis (MWF), type 2 diabetes mellitus, history of stroke who presents to the emergency department from home via EMS due to increased work of breathing which started about 1 hour prior to arrival to the ED.  EMS was activated and on arrival of EMS team, O2 sat was noted to be 61% on home oxygen (patient states that he uses supplemental oxygen only as needed at home).  He was placed on NRB with O2 sat increasing to 73%.  O2 sat increased to 91% on CPAP per report.  Patient was admitted with acute hypoxemic respiratory failure secondary to acute on chronic HFmrEF with hypertensive emergency and is also noted to have elevated troponin levels.  He was also noted to be positive for influenza A and started on treatment with Tamiflu.  He was also noted to have community-acquired pneumonia and completed 5-day course of treatment with IV azithromycin and Rocephin.    He had 2D echocardiogram with no significant findings as noted below and cardiology has signed off with recommendations to better control his blood  pressures.  He received multiple rounds of hemodialysis and was treated with Tamiflu and IV antibiotics while admitted and had significant improvement in his symptomatology and no longer requires these medications on discharge.  His blood pressures continue to remain borderline elevated, but they are much improved compared to admission and his home medications have been adjusted significantly.  He now appears to be in stable condition for discharge and will need to have close follow-up with his PCP to ensure stability of blood pressure readings.  Discharge Diagnoses:  Principal Problem:   Acute on chronic combined systolic and diastolic CHF (congestive heart failure) (HCC) Active Problems:   ESRD on hemodialysis (HCC)   Acute respiratory failure with hypoxia (HCC)   Hypoalbuminemia due to protein-calorie malnutrition (HCC)   Elevated troponin   Essential hypertension   CAP (community acquired pneumonia)   Hypocalcemia   Type 2 diabetes mellitus with hyperglycemia (HCC)  Principal discharge diagnosis: Acute hypoxemic respiratory failure secondary to acute on chronic combined systolic and diastolic CHF exacerbation in the setting of hypertensive emergency with associated elevated troponin levels.  Influenza A with community-acquired pneumonia.  Discharge Instructions  Discharge Instructions     Diet - low sodium heart healthy   Complete by: As directed    Increase activity slowly   Complete by: As directed       Allergies as of 07/21/2023   No Known Allergies      Medication List     TAKE these medications    amLODipine 10 MG tablet Commonly known as: NORVASC Take 1 tablet (10 mg total) by mouth daily.   calcitRIOL 0.5 MCG capsule Commonly known as:  ROCALTROL Take 1 capsule (0.5 mcg total) by mouth daily.   calcium carbonate 500 MG chewable tablet Commonly known as: TUMS - dosed in mg elemental calcium Chew 2 tablets by mouth every dialysis for indigestion or heartburn.    feeding supplement (GLUCERNA SHAKE) Liqd Take 237 mLs by mouth 3 (three) times daily between meals.   FeroSul 325 (65 Fe) MG tablet Generic drug: ferrous sulfate Take 325 mg by mouth 3 (three) times a week. Dialysis   furosemide 80 MG tablet Commonly known as: LASIX Take 1 tablet (80 mg total) by mouth daily.   hydrALAZINE 100 MG tablet Commonly known as: APRESOLINE Take 2 tablets (200 mg total) by mouth 3 (three) times daily. What changed: how much to take   irbesartan 300 MG tablet Commonly known as: AVAPRO Take 1 tablet (300 mg total) by mouth daily. Start taking on: July 22, 2023   labetalol 200 MG tablet Commonly known as: NORMODYNE Take 2 tablets (400 mg total) by mouth 2 (two) times daily.   lidocaine-prilocaine cream Commonly known as: EMLA Apply 1 Application topically.   magnesium gluconate 500 MG tablet Commonly known as: MAGONATE Take 500 mg by mouth 3 (three) times a week.   rosuvastatin 10 MG tablet Commonly known as: CRESTOR Take 1 tablet (10 mg total) by mouth daily. Start taking on: July 22, 2023   vitamin B-12 100 MCG tablet Commonly known as: CYANOCOBALAMIN Take 100 mcg by mouth daily.        Follow-up Information     [facility]. Schedule an appointment as soon as possible for a visit in 1 week(s).   Contact information: [address] [place] [address] [phone]                No Known Allergies  Consultations: Cardiology Nephrology   Procedures/Studies: ECHOCARDIOGRAM COMPLETE Result Date: 07/15/2023    ECHOCARDIOGRAM REPORT   Patient Name:   Matthew Robles Date of Exam: 07/15/2023 Medical Rec #:  [id number]       Height:       72.0 in Accession #:    [id number]      Weight:       159.0 lb Date of Birth:  1955/12/25       BSA:          1.932 m Patient Age:    67 years        BP:           165/68 mmHg Patient Gender: M               HR:           87 bpm. [facility]  Procedure: 2D Echo, Cardiac Doppler and Color Doppler (Both Spectral and Color            Flow Doppler were utilized during procedure). Indications:    CHF-Acute Systolic I50.21  History:        Patient has prior history of Echocardiogram examinations, most                 recent 03/29/2023. CHF; Risk Factors:Hypertension, Diabetes and                 Dyslipidemia.  Sonographer:    Celesta Gentile RCS Referring Phys: [id number] OLADAPO ADEFESO IMPRESSIONS  1. Left ventricular ejection fraction, by estimation, is 60 to 65%. The left ventricle has normal function. The left ventricle has no regional wall motion abnormalities. There  is moderate left ventricular hypertrophy. Left ventricular diastolic parameters are indeterminate. Elevated left atrial pressure.  2. Right ventricular systolic function is normal. The right ventricular size is normal. Tricuspid regurgitation signal is inadequate for assessing PA pressure.  3. Left atrial size was severely dilated.  4. Right atrial size was severely dilated.  5. The mitral valve is normal in structure. No evidence of mitral valve regurgitation. No evidence of mitral stenosis.  6. The aortic valve is tricuspid. There is mild calcification of the aortic valve. There is mild thickening of the aortic valve. Aortic valve regurgitation is not visualized. No aortic stenosis is present.  7. The inferior vena cava is normal in size with greater than 50% respiratory variability, suggesting right atrial pressure of 3 mmHg. FINDINGS  Left Ventricle: Left ventricular ejection fraction, by estimation, is 60 to 65%. The left ventricle has normal function. The left ventricle has no regional wall motion abnormalities. Strain imaging was not performed. The left ventricular internal cavity  size was normal in size. There is moderate left ventricular hypertrophy. Left ventricular diastolic parameters are indeterminate. Elevated left atrial pressure. Right Ventricle: The right ventricular size is  normal. Right vetricular wall thickness was not well visualized. Right ventricular systolic function is normal. Tricuspid regurgitation signal is inadequate for assessing PA pressure. Left Atrium: Left atrial size was severely dilated. Right Atrium: Right atrial size was severely dilated. Pericardium: There is no evidence of pericardial effusion. Mitral Valve: The mitral valve is normal in structure. No evidence of mitral valve regurgitation. No evidence of mitral valve stenosis. Tricuspid Valve: The tricuspid valve is normal in structure. Tricuspid valve regurgitation is not demonstrated. No evidence of tricuspid stenosis. Aortic Valve: The aortic valve is tricuspid. There is mild calcification of the aortic valve. There is mild thickening of the aortic valve. There is mild aortic valve annular calcification. Aortic valve regurgitation is not visualized. No aortic stenosis  is present. Aortic valve mean gradient measures 3.5 mmHg. Aortic valve peak gradient measures 6.3 mmHg. Aortic valve area, by VTI measures 2.90 cm. Pulmonic Valve: The pulmonic valve was not well visualized. Pulmonic valve regurgitation is not visualized. No evidence of pulmonic stenosis. Aorta: The aortic root is normal in size and structure. Venous: The inferior vena cava is normal in size with greater than 50% respiratory variability, suggesting right atrial pressure of 3 mmHg. IAS/Shunts: No atrial level shunt detected by color flow Doppler. Additional Comments: 3D imaging was not performed.  LEFT VENTRICLE PLAX 2D LVIDd:         5.20 cm   Diastology LVIDs:         3.50 cm   LV e' medial:    5.33 cm/s LV PW:         1.20 cm   LV E/e' medial:  19.3 LV IVS:        1.30 cm   LV e' lateral:   5.55 cm/s LVOT diam:     2.20 cm   LV E/e' lateral: 18.6 LV SV:         63 LV SV Index:   32 LVOT Area:     3.80 cm  RIGHT VENTRICLE RV S prime:     19.60 cm/s TAPSE (M-mode): 2.2 cm LEFT ATRIUM              Index        RIGHT ATRIUM           Index LA  diam:  4.90 cm  2.54 cm/m   RA Area:     28.30 cm LA Vol (A2C):   112.0 ml 57.96 ml/m  RA Volume:   94.10 ml  48.70 ml/m LA Vol (A4C):   169.0 ml 87.46 ml/m LA Biplane Vol: 142.0 ml 73.48 ml/m  AORTIC VALVE AV Area (Vmax):    2.73 cm AV Area (Vmean):   2.79 cm AV Area (VTI):     2.90 cm AV Vmax:           125.47 cm/s AV Vmean:          87.901 cm/s AV VTI:            0.217 m AV Peak Grad:      6.3 mmHg AV Mean Grad:      3.5 mmHg LVOT Vmax:         90.10 cm/s LVOT Vmean:        64.600 cm/s LVOT VTI:          0.165 m LVOT/AV VTI ratio: 0.76  AORTA Ao Root diam: 3.40 cm MITRAL VALVE MV Area (PHT): 5.54 cm     SHUNTS MV Decel Time: 137 msec     Systemic VTI:  0.16 m MV E velocity: 103.00 cm/s  Systemic Diam: 2.20 cm MV A velocity: 90.00 cm/s MV E/A ratio:  1.14 Dina Rich MD Electronically signed by Dina Rich MD Signature Date/Time: 07/15/2023/5:22:47 PM    Final    DG Chest Port 1 View Result Date: 07/14/2023 CLINICAL DATA:  Shortness of breath.  History of CHF. EXAM: PORTABLE CHEST 1 VIEW COMPARISON:  03/27/2023. FINDINGS: Redemonstration of moderate increased interstitial markings throughout bilateral lungs with bilateral hilar and lower lung zone predominance. There is superimposed heterogeneous opacity overlying the right lower lung zone partially obscuring the right lateral hemidiaphragm, concerning for pneumonia. Follow-up to clearing is recommended. Bilateral lung fields are otherwise clear. Left lateral costophrenic angle is clear. Stable cardio-mediastinal silhouette. No acute osseous abnormalities. The soft tissues are within normal limits. IMPRESSION: *Findings favor congestive heart failure/pulmonary edema. *There is superimposed heterogeneous opacity overlying the right lower lung zone partially obscuring the right lateral hemidiaphragm, concerning for pneumonia. Follow-up to clearing is recommended. Electronically Signed   By: Jules Schick M.D.   On: 07/14/2023 19:26      Discharge Exam: Vitals:   07/21/23 0912 07/21/23 1000  BP: (!) 165/65 (!) 165/65  Pulse: 73   Resp:    Temp:    SpO2:     Vitals:   07/21/23 0348 07/21/23 0500 07/21/23 0912 07/21/23 1000  BP: (!) 166/78  (!) 165/65 (!) 165/65  Pulse: 79  73   Resp: 16     Temp: 98.4 F (36.9 C)     TempSrc:      SpO2: 92%     Weight:  75.8 kg    Height:        General: Pt is alert, awake, not in acute distress Cardiovascular: RRR, S1/S2 +, no rubs, no gallops Respiratory: CTA bilaterally, no wheezing, no rhonchi Abdominal: Soft, NT, ND, bowel sounds + Extremities: no edema, no cyanosis    The results of significant diagnostics from this hospitalization (including imaging, microbiology, ancillary and laboratory) are listed below for reference.     Microbiology: Recent Results (from the past 240 hours)  Culture, blood (Routine X 2) w Reflex to ID Panel     Status: None   Collection Time: 07/15/23  3:03 AM   Specimen: BLOOD  Result Value Ref Range Status   Specimen Description BLOOD BLOOD RIGHT ARM  Final   Special Requests   Final    BOTTLES DRAWN AEROBIC AND ANAEROBIC Blood Culture adequate volume   Culture   Final    NO GROWTH 5 DAYS Performed at [facility], [address], [place], [place] [address]    Report Status 07/20/2023 FINAL  Final  Culture, blood (Routine X 2) w Reflex to ID Panel     Status: None   Collection Time: 07/15/23  3:05 AM   Specimen: BLOOD  Result Value Ref Range Status   Specimen Description BLOOD BLOOD RIGHT HAND  Final   Special Requests   Final    BOTTLES DRAWN AEROBIC AND ANAEROBIC Blood Culture adequate volume   Culture   Final    NO GROWTH 5 DAYS Performed at [facility], [address], [place], [place] [address]    Report Status 07/20/2023 FINAL  Final  MRSA Next Gen by PCR, Nasal     Status: None   Collection Time: 07/15/23  8:40 AM   Specimen: Nasal Mucosa; Nasal Swab  Result Value Ref Range Status   MRSA by PCR Next Gen NOT  DETECTED NOT DETECTED Final    Comment: (NOTE) The GeneXpert MRSA Assay (FDA approved for NASAL specimens only), is one component of a comprehensive MRSA colonization surveillance program. It is not intended to diagnose MRSA infection nor to guide or monitor treatment for MRSA infections. Test performance is not FDA approved in patients less than 42 years old. Performed at [facility], [address] [place], [place] [address]   Resp panel by RT-PCR (RSV, Flu A&B, Covid) Anterior Nasal Swab     Status: Abnormal   Collection Time: 07/15/23  9:21 AM   Specimen: Anterior Nasal Swab  Result Value Ref Range Status   SARS Coronavirus 2 by RT PCR NEGATIVE NEGATIVE Final    Comment: (NOTE) SARS-CoV-2 target nucleic acids are NOT DETECTED.  The SARS-CoV-2 RNA is generally detectable in upper respiratory specimens during the acute phase of infection. The lowest concentration of SARS-CoV-2 viral copies this assay can detect is 138 copies/mL. A negative result does not preclude SARS-Cov-2 infection and should not be used as the sole basis for treatment or other patient management decisions. A negative result may occur with  improper specimen collection/handling, submission of specimen other than nasopharyngeal swab, presence of viral mutation(s) within the areas targeted by this assay, and inadequate number of viral copies(<138 copies/mL). A negative result must be combined with clinical observations, patient history, and epidemiological information. The expected result is Negative.  Fact Sheet for Patients:  BloggerCourse.com  Fact Sheet for Healthcare Providers:  SeriousBroker.it  This test is no t yet approved or cleared by the [place] FDA and  has been authorized for detection and/or diagnosis of SARS-CoV-2 by FDA under an Emergency Use Authorization (EUA). This EUA will remain  in effect (meaning this test can be used) for the  duration of the COVID-19 declaration under Section 564(b)(1) of the Act, 21 U.S.C.section 360bbb-3(b)(1), unless the authorization is terminated  or revoked sooner.       Influenza A by PCR POSITIVE (A) NEGATIVE Final   Influenza B by PCR NEGATIVE NEGATIVE Final    Comment: (NOTE) The Xpert Xpress SARS-CoV-2/FLU/RSV plus assay is intended as an aid in the diagnosis of influenza from Nasopharyngeal swab specimens and should not be used as a sole basis for treatment. Nasal washings and aspirates are  unacceptable for Xpert Xpress SARS-CoV-2/FLU/RSV testing.  Fact Sheet for Patients: BloggerCourse.com  Fact Sheet for Healthcare Providers: SeriousBroker.it  This test is not yet approved or cleared by the [place] FDA and has been authorized for detection and/or diagnosis of SARS-CoV-2 by FDA under an Emergency Use Authorization (EUA). This EUA will remain in effect (meaning this test can be used) for the duration of the COVID-19 declaration under Section 564(b)(1) of the Act, 21 U.S.C. section 360bbb-3(b)(1), unless the authorization is terminated or revoked.     Resp Syncytial Virus by PCR NEGATIVE NEGATIVE Final    Comment: (NOTE) Fact Sheet for Patients: BloggerCourse.com  Fact Sheet for Healthcare Providers: SeriousBroker.it  This test is not yet approved or cleared by the [place] FDA and has been authorized for detection and/or diagnosis of SARS-CoV-2 by FDA under an Emergency Use Authorization (EUA). This EUA will remain in effect (meaning this test can be used) for the duration of the COVID-19 declaration under Section 564(b)(1) of the Act, 21 U.S.C. section 360bbb-3(b)(1), unless the authorization is terminated or revoked.  Performed at [facility], [address], [place], [place] [address]      Labs: BNP (last 3 results) Recent Labs    02/15/23 1935  03/27/23 1845 07/14/23 2042  BNP >4,500.0* 2,781.0* 3,242.0*   Basic Metabolic Panel: Recent Labs  Lab 07/15/23 0119 07/15/23 6045 07/16/23 0501 07/17/23 0308 07/18/23 0334 07/19/23 0454 07/20/23 0337 07/20/23 1028 07/21/23 0346  NA 136  137  --    < > 131* 129* 134* 133* 131* 133*  K 4.1  4.2  --    < > 3.8 3.8 3.8 3.6 3.7 4.0  CL 100  99  --    < > 94* 94* 96* 93* 92* 97*  CO2 22  22  --    < > 23 20* 24 24 22 26   GLUCOSE 126*  127*  --    < > 94 111* 122* 125* 131* 97  BUN 59*  58*  --    < > 66* 84* 52* 60* 62* 40*  CREATININE 8.93*  8.96*  --    < > 9.90* 11.96* 8.28* 10.28* 10.91* 7.55*  CALCIUM 7.8*  7.9*  --    < > 7.2* 7.3* 7.7* 7.7* 7.7* 7.9*  MG 2.1  --    < > 2.0 2.3 2.1 2.1  --  2.2  PHOS 4.6  4.5 5.1*  --   --   --   --   --  4.9*  --    < > = values in this interval not displayed.   Liver Function Tests: Recent Labs  Lab 07/14/23 2042 07/15/23 0119 07/18/23 0516 07/20/23 1028  AST 21 17  --   --   ALT 13 12  --   --   ALKPHOS 55 50  --   --   BILITOT 1.1 1.1  --   --   PROT 7.6 7.0  --   --   ALBUMIN 3.2* 2.9*  2.9* 2.7* 2.5*   No results for input(s): "LIPASE", "AMYLASE" in the last 168 hours. No results for input(s): "AMMONIA" in the last 168 hours. CBC: Recent Labs  Lab 07/14/23 2042 07/15/23 0119 07/17/23 0308 07/18/23 0334 07/19/23 0454 07/20/23 0337 07/21/23 0346  WBC 13.4*   < > 5.6 6.1 4.9 5.1 6.7  NEUTROABS 12.3*  --   --   --   --   --   --   HGB  10.0*   < > 7.5* 8.1* 8.8* 8.2* 9.1*  HCT 30.3*   < > 22.4* 24.9* 26.8* 24.7* 28.0*  MCV 96.8   < > 94.9 95.4 94.0 93.9 94.6  PLT 227   < > 188 192 214 208 212   < > = values in this interval not displayed.   Cardiac Enzymes: No results for input(s): "CKTOTAL", "CKMB", "CKMBINDEX", "TROPONINI" in the last 168 hours. BNP: Invalid input(s): "POCBNP" CBG: Recent Labs  Lab 07/14/23 2123 07/15/23 1946  GLUCAP 165* 100*   D-Dimer No results for input(s): "DDIMER" in the  last 72 hours. Hgb A1c No results for input(s): "HGBA1C" in the last 72 hours. Lipid Profile No results for input(s): "CHOL", "HDL", "LDLCALC", "TRIG", "CHOLHDL", "LDLDIRECT" in the last 72 hours. Thyroid function studies No results for input(s): "TSH", "T4TOTAL", "T3FREE", "THYROIDAB" in the last 72 hours.  Invalid input(s): "FREET3" Anemia work up No results for input(s): "VITAMINB12", "FOLATE", "FERRITIN", "TIBC", "IRON", "RETICCTPCT" in the last 72 hours. Urinalysis    Component Value Date/Time   COLORURINE STRAW (A) 12/25/2020 2057   APPEARANCEUR CLEAR 12/25/2020 2057   LABSPEC 1.006 12/25/2020 2057   PHURINE 8.0 12/25/2020 2057   GLUCOSEU 50 (A) 12/25/2020 2057   HGBUR SMALL (A) 12/25/2020 2057   BILIRUBINUR NEGATIVE 12/25/2020 2057   KETONESUR NEGATIVE 12/25/2020 2057   PROTEINUR 30 (A) 12/25/2020 2057   NITRITE NEGATIVE 12/25/2020 2057   LEUKOCYTESUR NEGATIVE 12/25/2020 2057   Sepsis Labs Recent Labs  Lab 07/18/23 0334 07/19/23 0454 07/20/23 0337 07/21/23 0346  WBC 6.1 4.9 5.1 6.7   Microbiology Recent Results (from the past 240 hours)  Culture, blood (Routine X 2) w Reflex to ID Panel     Status: None   Collection Time: 07/15/23  3:03 AM   Specimen: BLOOD  Result Value Ref Range Status   Specimen Description BLOOD BLOOD RIGHT ARM  Final   Special Requests   Final    BOTTLES DRAWN AEROBIC AND ANAEROBIC Blood Culture adequate volume   Culture   Final    NO GROWTH 5 DAYS Performed at [facility], [address], [place], [place] [address]    Report Status 07/20/2023 FINAL  Final  Culture, blood (Routine X 2) w Reflex to ID Panel     Status: None   Collection Time: 07/15/23  3:05 AM   Specimen: BLOOD  Result Value Ref Range Status   Specimen Description BLOOD BLOOD RIGHT HAND  Final   Special Requests   Final    BOTTLES DRAWN AEROBIC AND ANAEROBIC Blood Culture adequate volume   Culture   Final    NO GROWTH 5 DAYS Performed at [facility], [address], [place], [place] [address]    Report Status 07/20/2023 FINAL  Final  MRSA Next Gen by PCR, Nasal     Status: None   Collection Time: 07/15/23  8:40 AM   Specimen: Nasal Mucosa; Nasal Swab  Result Value Ref Range Status   MRSA by PCR Next Gen NOT DETECTED NOT DETECTED Final    Comment: (NOTE) The GeneXpert MRSA Assay (FDA approved for NASAL specimens only), is one component of a comprehensive MRSA colonization surveillance program. It is not intended to diagnose MRSA infection nor to guide or monitor treatment for MRSA infections. Test performance is not FDA approved in patients less than 32 years old. Performed at [facility], [address] [place], [place] [address]   Resp panel by RT-PCR (RSV, Flu A&B, Covid) Anterior Nasal  Swab     Status: Abnormal   Collection Time: 07/15/23  9:21 AM   Specimen: Anterior Nasal Swab  Result Value Ref Range Status   SARS Coronavirus 2 by RT PCR NEGATIVE NEGATIVE Final    Comment: (NOTE) SARS-CoV-2 target nucleic acids are NOT DETECTED.  The SARS-CoV-2 RNA is generally detectable in upper respiratory specimens during the acute phase of infection. The lowest concentration of SARS-CoV-2 viral copies this assay can detect is 138 copies/mL. A negative result does not preclude SARS-Cov-2 infection and should not be used as the sole basis for treatment or other patient management decisions. A negative result may occur with  improper specimen collection/handling, submission of specimen other than nasopharyngeal swab, presence of viral mutation(s) within the areas targeted by this assay, and inadequate number of viral copies(<138 copies/mL). A negative result must be combined with clinical observations, patient history, and epidemiological information. The expected result is Negative.  Fact Sheet for Patients:  BloggerCourse.com  Fact Sheet for Healthcare Providers:  SeriousBroker.it  This  test is no t yet approved or cleared by the [place] FDA and  has been authorized for detection and/or diagnosis of SARS-CoV-2 by FDA under an Emergency Use Authorization (EUA). This EUA will remain  in effect (meaning this test can be used) for the duration of the COVID-19 declaration under Section 564(b)(1) of the Act, 21 U.S.C.section 360bbb-3(b)(1), unless the authorization is terminated  or revoked sooner.       Influenza A by PCR POSITIVE (A) NEGATIVE Final   Influenza B by PCR NEGATIVE NEGATIVE Final    Comment: (NOTE) The Xpert Xpress SARS-CoV-2/FLU/RSV plus assay is intended as an aid in the diagnosis of influenza from Nasopharyngeal swab specimens and should not be used as a sole basis for treatment. Nasal washings and aspirates are unacceptable for Xpert Xpress SARS-CoV-2/FLU/RSV testing.  Fact Sheet for Patients: BloggerCourse.com  Fact Sheet for Healthcare Providers: SeriousBroker.it  This test is not yet approved or cleared by the [place] FDA and has been authorized for detection and/or diagnosis of SARS-CoV-2 by FDA under an Emergency Use Authorization (EUA). This EUA will remain in effect (meaning this test can be used) for the duration of the COVID-19 declaration under Section 564(b)(1) of the Act, 21 U.S.C. section 360bbb-3(b)(1), unless the authorization is terminated or revoked.     Resp Syncytial Virus by PCR NEGATIVE NEGATIVE Final    Comment: (NOTE) Fact Sheet for Patients: BloggerCourse.com  Fact Sheet for Healthcare Providers: SeriousBroker.it  This test is not yet approved or cleared by the [place] FDA and has been authorized for detection and/or diagnosis of SARS-CoV-2 by FDA under an Emergency Use Authorization (EUA). This EUA will remain in effect (meaning this test can be used) for the duration of the COVID-19 declaration under  Section 564(b)(1) of the Act, 21 U.S.C. section 360bbb-3(b)(1), unless the authorization is terminated or revoked.  Performed at [facility], [address] [place], [place] [address]      Time coordinating discharge: 35 minutes  SIGNED:   Erick Blinks, DO [facility] 07/21/2023, 10:44 AM  If 7PM-7AM, please contact night-coverage [email or web address]

## 2023-10-22 ENCOUNTER — Emergency Department (HOSPITAL_COMMUNITY)
Admission: EM | Admit: 2023-10-22 | Discharge: 2023-11-22 | Disposition: E | Attending: Emergency Medicine | Admitting: Emergency Medicine

## 2023-10-22 DIAGNOSIS — R140 Abdominal distension (gaseous): Secondary | ICD-10-CM | POA: Insufficient documentation

## 2023-10-22 DIAGNOSIS — N189 Chronic kidney disease, unspecified: Secondary | ICD-10-CM | POA: Diagnosis not present

## 2023-10-22 DIAGNOSIS — E1165 Type 2 diabetes mellitus with hyperglycemia: Secondary | ICD-10-CM | POA: Diagnosis not present

## 2023-10-22 DIAGNOSIS — Z992 Dependence on renal dialysis: Secondary | ICD-10-CM | POA: Diagnosis not present

## 2023-10-22 DIAGNOSIS — E1122 Type 2 diabetes mellitus with diabetic chronic kidney disease: Secondary | ICD-10-CM | POA: Diagnosis not present

## 2023-10-22 DIAGNOSIS — I469 Cardiac arrest, cause unspecified: Secondary | ICD-10-CM | POA: Diagnosis present

## 2023-10-22 LAB — CBG MONITORING, ED: Glucose-Capillary: 151 mg/dL — ABNORMAL HIGH (ref 70–99)

## 2023-10-22 NOTE — ED Triage Notes (Signed)
 Pt bib EMS after pt was found unresponsive in floor after falling in bathroom at home. Per EMS, pt did not have a pulse upon their arrival and CPR was initiated. EMS reports pt had ROSC x 2 and received a total of 3 EPi's , 1 amp Na Bicarb, and was started on an Epi drip at 10mcg/min. EMS also reports CBG of 256 and the placement of a King airway and an I/O in L tibia. Upon arrival to ER at 2200, pt was noted to be pulseless and CPR was again initiated. . Pt was fiven 1 mg Epinephrine  at 220 3, pulses checked at 2204. Pt in asystole. CPR resumed. CBG was checked and noted to be 151. 2205 another 1mg  Epinephrine  was given, pulses checked at 2206, pt remained in asystole, cpr resumed. 2208, Epinephrine  1mg  given, pulses checked, asystole, cpr resumed. 2210 CPR stopped per MD.

## 2023-10-23 DEATH — deceased

## 2023-11-22 NOTE — ED Provider Notes (Signed)
 [place] EMERGENCY DEPARTMENT AT [facility] Provider Note   CSN: [id number] Arrival date & time: 10/08/2023  2202     History  Chief Complaint  Patient presents with   Post CPR    Matthew Robles is a 68 y.o. male.  Patient has a history of diabetes and dialysis.  He arrested at home and paramedics worked on him for 45 minutes giving him multiple epis.  I also had him on an epi drip.  When he arrived in the emergency department he was pulseless and had a King airway in  The history is provided by the EMS personnel. No language interpreter was used.  Cardiac Arrest Witnessed by:  Family member Incident location:  Home Time before BLS initiated:  Immediate Time before ALS initiated:  8-10 minutes Condition upon EMS arrival:  Unresponsive Pulse:  Absent Initial cardiac rhythm per EMS:  Asystole Treatments prior to arrival:  ACLS protocol Medications given prior to ED:  Epinephrine  Airway: King airway. Rhythm on admission to ED:  Unchanged Risk factors: diabetes mellitus        Home Medications Prior to Admission medications   Medication Sig Start Date End Date Taking? Authorizing Provider  amLODipine  (NORVASC ) 10 MG tablet Take 1 tablet (10 mg total) by mouth daily. 04/01/23   Macdonald Savoy, MD  calcitRIOL  (ROCALTROL ) 0.5 MCG capsule Take 1 capsule (0.5 mcg total) by mouth daily. 07/05/22   Demaris Fillers, MD  calcium  carbonate (TUMS - DOSED IN MG ELEMENTAL CALCIUM ) 500 MG chewable tablet Chew 2 tablets by mouth every dialysis for indigestion or heartburn.    [provider]  feeding supplement, GLUCERNA SHAKE, (GLUCERNA SHAKE) LIQD Take 237 mLs by mouth 3 (three) times daily between meals. 07/21/23   Mason Sole, Pratik D, DO  FEROSUL 325 (65 Fe) MG tablet Take 325 mg by mouth 3 (three) times a week. Dialysis 08/18/22   [provider]  furosemide  (LASIX ) 80 MG tablet Take 1 tablet (80 mg total) by mouth daily. 04/01/23   Macdonald Savoy, MD   hydrALAZINE  (APRESOLINE ) 100 MG tablet Take 2 tablets (200 mg total) by mouth 3 (three) times daily. 07/21/23   Mason Sole, Pratik D, DO  irbesartan  (AVAPRO ) 300 MG tablet Take 1 tablet (300 mg total) by mouth daily. 07/22/23   Mason Sole, Pratik D, DO  labetalol  (NORMODYNE ) 200 MG tablet Take 2 tablets (400 mg total) by mouth 2 (two) times daily. 04/01/23   Macdonald Savoy, MD  lidocaine -prilocaine  (EMLA ) cream Apply 1 Application topically. 09/21/22   [provider]  magnesium  gluconate (MAGONATE) 500 MG tablet Take 500 mg by mouth 3 (three) times a week.    [provider]  rosuvastatin  (CRESTOR ) 10 MG tablet Take 1 tablet (10 mg total) by mouth daily. 07/22/23   Mason Sole, Pratik D, DO  vitamin B-12 (CYANOCOBALAMIN ) 100 MCG tablet Take 100 mcg by mouth daily.    [provider]      Allergies    Patient has no known allergies.    Review of Systems   Review of Systems  Unable to perform ROS: Acuity of condition    Physical Exam Updated Vital Signs There were no vitals taken for this visit. Physical Exam Vitals reviewed.  Constitutional:      Appearance: He is well-developed.     Comments: Unresponsive  HENT:     Head: Normocephalic.     Nose: Nose normal.     Mouth/Throat:     Comments: King airway  in place Eyes:     General: No scleral icterus.    Conjunctiva/sclera: Conjunctivae normal.  Neck:     Thyroid : No thyromegaly.  Cardiovascular:     Heart sounds: No murmur heard.    No friction rub. No gallop.     Comments: CPR in progress with chest compressions Pulmonary:     Breath sounds: No stridor. No wheezing or rales.  Chest:     Chest wall: No tenderness.  Abdominal:     General: There is distension.     Tenderness: There is no abdominal tenderness. There is no rebound.  Musculoskeletal:     Cervical back: Neck supple.     Comments: No spontaneous movement of extremities  Lymphadenopathy:     Cervical: No cervical adenopathy.  Skin:    Findings:  No erythema or rash.  Neurological:     Motor: No abnormal muscle tone.     Comments: Patient not responding to verbal or painful stimuli while having CPR     ED Results / Procedures / Treatments   Labs (all labs ordered are listed, but only abnormal results are displayed) Labs Reviewed  CBG MONITORING, ED - Abnormal; Notable for the following components:      Result Value   Glucose-Capillary 151 (*)    All other components within normal limits    EKG None  Radiology No results found.  Procedures Procedures    Medications Ordered in ED Medications - No data to display  ED Course/ Medical Decision Making/ A&P Patient was pronounced at 2210.  I spoke with the ME and he stated that nephrology can take care of the death certificate.  [facility] is the nephrology group to take care of the patient   CRITICAL CARE Performed by: Cheyenne Cotta Total critical care time: 40 minutes Critical care time was exclusive of separately billable procedures and treating other patients. Critical care was necessary to treat or prevent imminent or life-threatening deterioration. Critical care was time spent personally by me on the following activities: development of treatment plan with patient and/or surrogate as well as nursing, discussions with consultants, evaluation of patient's response to treatment, examination of patient, obtaining history from patient or surrogate, ordering and performing treatments and interventions, ordering and review of laboratory studies, ordering and review of radiographic studies, pulse oximetry and re-evaluation of patient's condition.  I spoke to the mother the patient and notified her of her son's death Click here for ABCD2, HEART and other calculatorsREFRESH Note before signing :1}                              Medical Decision Making  Cardiopulmonary arrest with a history of renal failure at dialysis and diabetes.        Final Clinical  Impression(s) / ED Diagnoses Final diagnoses:  Cardiopulmonary arrest [facility])    Rx / DC Orders ED Discharge Orders     None         Cheyenne Cotta, MD 10/23/23 1043    Cheyenne Cotta, MD 10/25/23 1530

## 2023-11-22 DEATH — deceased
